# Patient Record
Sex: Male | Born: 1965
Health system: Southern US, Community
[De-identification: ages and names within clinical notes are randomized; demographics above are authoritative.]

## PROBLEM LIST (undated history)

## (undated) DIAGNOSIS — B20 Human immunodeficiency virus [HIV] disease: Secondary | ICD-10-CM

## (undated) DIAGNOSIS — Z8619 Personal history of other infectious and parasitic diseases: Secondary | ICD-10-CM

## (undated) DIAGNOSIS — I10 Essential (primary) hypertension: Secondary | ICD-10-CM

## (undated) DIAGNOSIS — K529 Noninfective gastroenteritis and colitis, unspecified: Secondary | ICD-10-CM

## (undated) DIAGNOSIS — K610 Anal abscess: Secondary | ICD-10-CM

## (undated) DIAGNOSIS — K649 Unspecified hemorrhoids: Secondary | ICD-10-CM

## (undated) DIAGNOSIS — M009 Pyogenic arthritis, unspecified: Secondary | ICD-10-CM

## (undated) DIAGNOSIS — I82409 Acute embolism and thrombosis of unspecified deep veins of unspecified lower extremity: Secondary | ICD-10-CM

## (undated) DIAGNOSIS — K611 Rectal abscess: Secondary | ICD-10-CM

## (undated) DIAGNOSIS — K5790 Diverticulosis of intestine, part unspecified, without perforation or abscess without bleeding: Secondary | ICD-10-CM

## (undated) DIAGNOSIS — M549 Dorsalgia, unspecified: Secondary | ICD-10-CM

## (undated) HISTORY — PX: OTHER SURGICAL HISTORY: SHX169

## (undated) HISTORY — DX: Pyogenic arthritis, unspecified: M00.9

## (undated) HISTORY — DX: Noninfective gastroenteritis and colitis, unspecified: K52.9

## (undated) HISTORY — DX: Anal abscess: K61.0

## (undated) HISTORY — DX: Diverticulosis of intestine, part unspecified, without perforation or abscess without bleeding: K57.90

## (undated) HISTORY — DX: Unspecified hemorrhoids: K64.9

## (undated) HISTORY — DX: Dorsalgia, unspecified: M54.9

## (undated) HISTORY — DX: Rectal abscess: K61.1

---

## 1999-10-21 ENCOUNTER — Emergency Department (HOSPITAL_COMMUNITY): Admission: EM | Admit: 1999-10-21 | Discharge: 1999-10-21 | Payer: Self-pay | Admitting: Emergency Medicine

## 1999-10-21 ENCOUNTER — Encounter: Payer: Self-pay | Admitting: Emergency Medicine

## 2001-02-28 ENCOUNTER — Encounter: Payer: Self-pay | Admitting: Emergency Medicine

## 2001-02-28 ENCOUNTER — Emergency Department (HOSPITAL_COMMUNITY): Admission: EM | Admit: 2001-02-28 | Discharge: 2001-02-28 | Payer: Self-pay | Admitting: Emergency Medicine

## 2007-03-29 ENCOUNTER — Ambulatory Visit: Payer: Self-pay | Admitting: Nurse Practitioner

## 2007-03-29 DIAGNOSIS — I10 Essential (primary) hypertension: Secondary | ICD-10-CM | POA: Insufficient documentation

## 2007-03-29 LAB — CONVERTED CEMR LAB
ALT: 27 units/L (ref 0–53)
AST: 36 units/L (ref 0–37)
Albumin: 4.5 g/dL (ref 3.5–5.2)
Alkaline Phosphatase: 47 units/L (ref 39–117)
BUN: 10 mg/dL (ref 6–23)
Basophils Absolute: 0 10*3/uL (ref 0.0–0.1)
Basophils Relative: 0 % (ref 0–1)
Bilirubin Urine: NEGATIVE
Blood in Urine, dipstick: NEGATIVE
CO2: 28 meq/L (ref 19–32)
Calcium: 9.5 mg/dL (ref 8.4–10.5)
Chloride: 99 meq/L (ref 96–112)
Cholesterol: 155 mg/dL (ref 0–200)
Creatinine, Ser: 0.9 mg/dL (ref 0.40–1.50)
Eosinophils Absolute: 0.2 10*3/uL (ref 0.2–0.7)
Eosinophils Relative: 3 % (ref 0–5)
Glucose, Bld: 113 mg/dL — ABNORMAL HIGH (ref 70–99)
Glucose, Urine, Semiquant: NEGATIVE
HCT: 44.3 % (ref 39.0–52.0)
HDL: 36 mg/dL — ABNORMAL LOW (ref >39)
Hemoglobin: 15 g/dL (ref 13.0–17.0)
Ketones, urine, test strip: NEGATIVE
LDL Cholesterol: 98 mg/dL (ref 0–99)
Lymphocytes Relative: 45 % (ref 12–46)
Lymphs Abs: 2.1 10*3/uL (ref 0.7–4.0)
MCHC: 33.9 g/dL (ref 30.0–36.0)
MCV: 88.2 fL (ref 78.0–100.0)
Monocytes Absolute: 0.5 10*3/uL (ref 0.1–1.0)
Monocytes Relative: 10 % (ref 3–12)
Neutro Abs: 1.9 10*3/uL (ref 1.7–7.7)
Neutrophils Relative %: 42 % — ABNORMAL LOW (ref 43–77)
Nitrite: NEGATIVE
PSA: 0.6 ng/mL (ref 0.10–4.00)
Platelets: 207 10*3/uL (ref 150–400)
Potassium: 4 meq/L (ref 3.5–5.3)
Protein, U semiquant: NEGATIVE
RBC: 5.02 M/uL (ref 4.22–5.81)
RDW: 14.3 % (ref 11.5–15.5)
Sodium: 137 meq/L (ref 135–145)
Specific Gravity, Urine: 1.025
TSH: 2.993 microintl units/mL (ref 0.350–5.50)
Total Bilirubin: 0.5 mg/dL (ref 0.3–1.2)
Total CHOL/HDL Ratio: 4.3
Total Protein: 8 g/dL (ref 6.0–8.3)
Triglycerides: 103 mg/dL (ref <150)
Urobilinogen, UA: 0.2
VLDL: 21 mg/dL (ref 0–40)
WBC Urine, dipstick: NEGATIVE
WBC: 4.7 10*3/uL (ref 4.0–10.5)
pH: 5

## 2007-04-03 ENCOUNTER — Encounter (INDEPENDENT_AMBULATORY_CARE_PROVIDER_SITE_OTHER): Payer: Self-pay | Admitting: Nurse Practitioner

## 2007-04-21 ENCOUNTER — Encounter (INDEPENDENT_AMBULATORY_CARE_PROVIDER_SITE_OTHER): Payer: Self-pay | Admitting: Nurse Practitioner

## 2007-11-02 ENCOUNTER — Telehealth (INDEPENDENT_AMBULATORY_CARE_PROVIDER_SITE_OTHER): Payer: Self-pay | Admitting: Nurse Practitioner

## 2008-03-27 ENCOUNTER — Telehealth (INDEPENDENT_AMBULATORY_CARE_PROVIDER_SITE_OTHER): Payer: Self-pay | Admitting: Nurse Practitioner

## 2008-07-12 ENCOUNTER — Emergency Department (HOSPITAL_COMMUNITY): Admission: EM | Admit: 2008-07-12 | Discharge: 2008-07-12 | Payer: Self-pay | Admitting: Neurology

## 2010-07-15 LAB — URINALYSIS, ROUTINE W REFLEX MICROSCOPIC
Bilirubin Urine: NEGATIVE
Glucose, UA: NEGATIVE mg/dL
Hgb urine dipstick: NEGATIVE
Ketones, ur: NEGATIVE mg/dL
Nitrite: NEGATIVE
Protein, ur: NEGATIVE mg/dL
Specific Gravity, Urine: 1.007 (ref 1.005–1.030)
Urobilinogen, UA: 0.2 mg/dL (ref 0.0–1.0)
pH: 5.5 (ref 5.0–8.0)

## 2010-07-15 LAB — CBC
HCT: 43.3 % (ref 39.0–52.0)
Hemoglobin: 15.1 g/dL (ref 13.0–17.0)
MCHC: 34.8 g/dL (ref 30.0–36.0)
RDW: 13.7 % (ref 11.5–15.5)

## 2010-07-15 LAB — RAPID URINE DRUG SCREEN, HOSP PERFORMED
Amphetamines: NOT DETECTED
Barbiturates: NOT DETECTED
Benzodiazepines: NOT DETECTED
Cocaine: POSITIVE — AB
Opiates: NOT DETECTED
Tetrahydrocannabinol: NOT DETECTED

## 2010-07-15 LAB — DIFFERENTIAL
Basophils Absolute: 0 10*3/uL (ref 0.0–0.1)
Basophils Relative: 1 % (ref 0–1)
Eosinophils Relative: 0 % (ref 0–5)
Lymphocytes Relative: 54 % — ABNORMAL HIGH (ref 12–46)
Monocytes Absolute: 0.5 10*3/uL (ref 0.1–1.0)
Monocytes Relative: 11 % (ref 3–12)

## 2010-07-15 LAB — POCT I-STAT, CHEM 8
BUN: 8 mg/dL (ref 6–23)
Calcium, Ion: 1.07 mmol/L — ABNORMAL LOW (ref 1.12–1.32)
Glucose, Bld: 146 mg/dL — ABNORMAL HIGH (ref 70–99)
TCO2: 25 mmol/L (ref 0–100)

## 2010-07-15 LAB — ETHANOL
Alcohol, Ethyl (B): 147 mg/dL — ABNORMAL HIGH (ref 0–10)
Alcohol, Ethyl (B): 250 mg/dL — ABNORMAL HIGH (ref 0–10)

## 2011-08-29 ENCOUNTER — Emergency Department (HOSPITAL_COMMUNITY): Payer: Self-pay

## 2011-08-29 ENCOUNTER — Inpatient Hospital Stay (HOSPITAL_COMMUNITY)
Admission: EM | Admit: 2011-08-29 | Discharge: 2011-09-01 | DRG: 392 | Disposition: A | Discharge status: Home / Self Care | Payer: MEDICAID | Source: Ambulatory Visit | Attending: General Surgery | Admitting: General Surgery

## 2011-08-29 ENCOUNTER — Encounter (HOSPITAL_COMMUNITY): Payer: Self-pay | Admitting: *Deleted

## 2011-08-29 DIAGNOSIS — E876 Hypokalemia: Secondary | ICD-10-CM

## 2011-08-29 DIAGNOSIS — K529 Noninfective gastroenteritis and colitis, unspecified: Secondary | ICD-10-CM

## 2011-08-29 DIAGNOSIS — Z9119 Patient's noncompliance with other medical treatment and regimen: Secondary | ICD-10-CM

## 2011-08-29 DIAGNOSIS — I10 Essential (primary) hypertension: Secondary | ICD-10-CM | POA: Diagnosis present

## 2011-08-29 DIAGNOSIS — K648 Other hemorrhoids: Secondary | ICD-10-CM | POA: Diagnosis present

## 2011-08-29 DIAGNOSIS — R933 Abnormal findings on diagnostic imaging of other parts of digestive tract: Secondary | ICD-10-CM

## 2011-08-29 DIAGNOSIS — K5289 Other specified noninfective gastroenteritis and colitis: Principal | ICD-10-CM | POA: Diagnosis present

## 2011-08-29 DIAGNOSIS — K603 Anal fistula: Secondary | ICD-10-CM

## 2011-08-29 DIAGNOSIS — Z91199 Patient's noncompliance with other medical treatment and regimen due to unspecified reason: Secondary | ICD-10-CM

## 2011-08-29 DIAGNOSIS — K5730 Diverticulosis of large intestine without perforation or abscess without bleeding: Secondary | ICD-10-CM

## 2011-08-29 DIAGNOSIS — K611 Rectal abscess: Secondary | ICD-10-CM

## 2011-08-29 DIAGNOSIS — K626 Ulcer of anus and rectum: Secondary | ICD-10-CM | POA: Diagnosis present

## 2011-08-29 DIAGNOSIS — D649 Anemia, unspecified: Secondary | ICD-10-CM

## 2011-08-29 DIAGNOSIS — K5732 Diverticulitis of large intestine without perforation or abscess without bleeding: Secondary | ICD-10-CM | POA: Diagnosis present

## 2011-08-29 HISTORY — DX: Essential (primary) hypertension: I10

## 2011-08-29 LAB — DIFFERENTIAL
Lymphocytes Relative: 21 % (ref 12–46)
Lymphs Abs: 1.1 10*3/uL (ref 0.7–4.0)
Monocytes Relative: 7 % (ref 3–12)
Neutro Abs: 3.6 10*3/uL (ref 1.7–7.7)
Neutrophils Relative %: 70 % (ref 43–77)

## 2011-08-29 LAB — BASIC METABOLIC PANEL
Calcium: 8.8 mg/dL (ref 8.4–10.5)
Creatinine, Ser: 1.19 mg/dL (ref 0.50–1.35)
GFR calc Af Amer: 85 mL/min — ABNORMAL LOW (ref >90)
Potassium: 2.9 mEq/L — ABNORMAL LOW (ref 3.5–5.1)
Sodium: 140 mEq/L (ref 135–145)

## 2011-08-29 LAB — URINALYSIS, ROUTINE W REFLEX MICROSCOPIC
Bilirubin Urine: NEGATIVE
Leukocytes, UA: NEGATIVE
Nitrite: NEGATIVE
Specific Gravity, Urine: 1.013 (ref 1.005–1.030)
Urobilinogen, UA: 1 mg/dL (ref 0.0–1.0)
pH: 5 (ref 5.0–8.0)

## 2011-08-29 LAB — CBC
Hemoglobin: 12 g/dL — ABNORMAL LOW (ref 13.0–17.0)
MCH: 28.6 pg (ref 26.0–34.0)
Platelets: 159 10*3/uL (ref 150–400)
RBC: 4.2 MIL/uL — ABNORMAL LOW (ref 4.22–5.81)
WBC: 5.1 10*3/uL (ref 4.0–10.5)

## 2011-08-29 MED ORDER — HYDROCHLOROTHIAZIDE 12.5 MG PO CAPS
12.5000 mg | ORAL_CAPSULE | Freq: Every day | ORAL | Status: DC
  Administered 2011-08-29 – 2011-08-31 (×3): 12.5 mg via ORAL
  Filled 2011-08-29 (×3): qty 1

## 2011-08-29 MED ORDER — OXYCODONE HCL 5 MG PO TABS
5.0000 mg | ORAL_TABLET | ORAL | Status: DC | PRN
  Administered 2011-08-30 – 2011-09-01 (×6): 5 mg via ORAL
  Filled 2011-08-29 (×6): qty 1

## 2011-08-29 MED ORDER — SODIUM CHLORIDE 0.9 % IV BOLUS (SEPSIS)
1000.0000 mL | Freq: Once | INTRAVENOUS | Status: AC
  Administered 2011-08-29: 1000 mL via INTRAVENOUS

## 2011-08-29 MED ORDER — ONDANSETRON HCL 4 MG/2ML IJ SOLN: 4.0000 mg | Freq: Four times a day (QID) | INTRAMUSCULAR | Status: DC | PRN

## 2011-08-29 MED ORDER — KCL IN DEXTROSE-NACL 20-5-0.45 MEQ/L-%-% IV SOLN
INTRAVENOUS | Status: DC
  Administered 2011-08-29 – 2011-08-31 (×5): via INTRAVENOUS
  Filled 2011-08-29 (×8): qty 1000

## 2011-08-29 MED ORDER — IOHEXOL 300 MG/ML  SOLN
20.0000 mL | INTRAMUSCULAR | Status: DC
  Administered 2011-08-29: 20 mL via ORAL

## 2011-08-29 MED ORDER — ENOXAPARIN SODIUM 40 MG/0.4ML ~~LOC~~ SOLN
40.0000 mg | SUBCUTANEOUS | Status: DC
  Administered 2011-08-29 – 2011-08-31 (×3): 40 mg via SUBCUTANEOUS
  Filled 2011-08-29 (×5): qty 0.4

## 2011-08-29 MED ORDER — SODIUM CHLORIDE 0.9 % IV SOLN
3.0000 g | Freq: Once | INTRAVENOUS | Status: AC
  Administered 2011-08-29: 3 g via INTRAVENOUS
  Filled 2011-08-29: qty 3

## 2011-08-29 MED ORDER — POTASSIUM CHLORIDE 10 MEQ/100ML IV SOLN
10.0000 meq | Freq: Once | INTRAVENOUS | Status: AC
  Administered 2011-08-29: 10 meq via INTRAVENOUS
  Filled 2011-08-29: qty 100

## 2011-08-29 MED ORDER — PANTOPRAZOLE SODIUM 40 MG IV SOLR
40.0000 mg | Freq: Every day | INTRAVENOUS | Status: DC
  Administered 2011-08-29 – 2011-08-31 (×3): 40 mg via INTRAVENOUS
  Filled 2011-08-29 (×4): qty 40

## 2011-08-29 MED ORDER — METRONIDAZOLE IN NACL 5-0.79 MG/ML-% IV SOLN
500.0000 mg | Freq: Three times a day (TID) | INTRAVENOUS | Status: DC
  Administered 2011-08-29 – 2011-09-01 (×9): 500 mg via INTRAVENOUS
  Filled 2011-08-29 (×10): qty 100

## 2011-08-29 MED ORDER — POTASSIUM CHLORIDE CRYS ER 20 MEQ PO TBCR
40.0000 meq | EXTENDED_RELEASE_TABLET | Freq: Once | ORAL | Status: AC
  Administered 2011-08-29: 40 meq via ORAL
  Filled 2011-08-29: qty 2

## 2011-08-29 MED ORDER — VITAMIN B-1 100 MG PO TABS
50.0000 mg | ORAL_TABLET | Freq: Once | ORAL | Status: AC
  Administered 2011-08-29: 50 mg via ORAL
  Filled 2011-08-29: qty 1

## 2011-08-29 MED ORDER — LISINOPRIL 20 MG PO TABS
20.0000 mg | ORAL_TABLET | Freq: Every day | ORAL | Status: DC
  Administered 2011-08-29 – 2011-08-31 (×3): 20 mg via ORAL
  Filled 2011-08-29 (×3): qty 1

## 2011-08-29 MED ORDER — IOHEXOL 300 MG/ML  SOLN
125.0000 mL | Freq: Once | INTRAMUSCULAR | Status: AC | PRN
  Administered 2011-08-29: 125 mL via INTRAVENOUS

## 2011-08-29 MED ORDER — MORPHINE SULFATE 2 MG/ML IJ SOLN: 2.0000 mg | INTRAMUSCULAR | Status: DC | PRN

## 2011-08-29 MED ORDER — DEXTROSE 5 % IV SOLN
1.0000 g | INTRAVENOUS | Status: DC
  Administered 2011-08-29 – 2011-08-31 (×3): 1 g via INTRAVENOUS
  Filled 2011-08-29 (×4): qty 10

## 2011-08-29 NOTE — ED Notes (Signed)
Patient transported to CT 

## 2011-08-29 NOTE — ED Provider Notes (Signed)
History     CSN: [id number]  Arrival date & time 08/29/11  4098   First MD Initiated Contact with Patient 08/29/11 [phone]      Chief Complaint  Patient presents with  . Recurrent Skin Infections    (Consider location/radiation/quality/duration/timing/severity/associated sxs/prior treatment) HPI Comments: Patient with a history of multiple skin infections, substance abuse with cocaine & alcohol presents to the emergency department with the chief complaint of repeated skin infections in the groin area.  Patient states he's had this infection intermittently over the last couple of months and that it hurts with ambulation.  He has been putting Vaseline in area to decrease pain with chafing.  Patient denies fevers, night sweats, chills, IV drug use, diabetes, steroid use, HIV or a history of STDs. Pt states he is sexually active and he does not always use protection. He denies abnormal penile discharge, dysuria, or penile lesions.   The history is provided by the patient.    Past Medical History  Diagnosis Date  . Hypertension     History reviewed. No pertinent past surgical history.  History reviewed. No pertinent family history.  History  Substance Use Topics  . Smoking status: Current Everyday Smoker  . Smokeless tobacco: Not on file  . Alcohol Use: Yes      Review of Systems  HENT: Negative for neck pain.   Genitourinary: Positive for genital sores. Negative for dysuria, frequency, hematuria, flank pain, discharge, penile swelling, scrotal swelling, enuresis, penile pain and testicular pain.  Musculoskeletal: Negative for back pain.  All other systems reviewed and are negative.    Allergies  Review of patient's allergies indicates no known allergies.  Home Medications   Current Outpatient Rx  Name Route Sig Dispense Refill  . IBUPROFEN 200 MG PO TABS Oral Take 800-1,000 mg by mouth every 6 (six) hours as needed. For pain    . LISINOPRIL-HYDROCHLOROTHIAZIDE 20-12.5  MG PO TABS Oral Take 1 tablet by mouth daily.      BP 115/70  Pulse 82  Temp(Src) 97.8 F (36.6 C) (Oral)  Resp 20  SpO2 98%  Physical Exam  Nursing note and vitals reviewed. Constitutional: He appears well-developed and well-nourished. No distress.       Sleeping. Fruity breath. Ammonia needed to awake pt for questioning    HENT:  Head: Normocephalic and atraumatic.  Eyes: Conjunctivae and EOM are normal.  Neck: Normal range of motion.  Cardiovascular: Normal rate, regular rhythm, normal heart sounds and intact distal pulses.   Pulmonary/Chest: Effort normal.  Abdominal:       Soft, obese,  nontenter to palpation. Non pulsatile aorta.   Genitourinary:       Exam chaperoned. Multiple ulcerated lesions in both right and left groin. Not currently draining. Severe ttp. No penile discharge, or swelling, 2 ulcerated lesions on penile head. Multiple draining abscesses of perianal region that appear to have been drained before.  Normal scrotum. Question perianal fluctuance involving rectal wall. NO gross blood on exam, hemoccult pending   Musculoskeletal: Normal range of motion.  Skin: Skin is warm and dry. No rash noted. He is not diaphoretic.  Psychiatric: He has a normal mood and affect. His behavior is normal.    ED Course  Procedures (including critical care time)  Labs Reviewed  CBC - Abnormal; Notable for the following:    RBC 4.20 (*)    Hemoglobin 12.0 (*)    HCT 36.1 (*)    RDW 16.1 (*)    All other  components within normal limits  BASIC METABOLIC PANEL - Abnormal; Notable for the following:    Potassium 2.9 (*)    Glucose, Bld 127 (*)    GFR calc non Af Amer 73 (*)    GFR calc Af Amer 85 (*)    All other components within normal limits  ETHANOL - Abnormal; Notable for the following:    Alcohol, Ethyl (B) 265 (*)    All other components within normal limits  URINALYSIS, ROUTINE W REFLEX MICROSCOPIC  DIFFERENTIAL  HERPES SIMPLEX VIRUS CULTURE  DRUG SCREEN PANEL  (SERUM)   No results found.   No diagnosis found.    MDM  Hypokalemia, ? Diverticulitis vs colon CA, rectal fistula from lower back to anus, anemia, multiple abscesses   Pt to be admitted to General surgery. The patient appears reasonably stabilized for admission considering the current resources, flow, and capabilities available in the ED at this time, and I doubt any other [facility] requiring further screening and/or treatment in the ED prior to admission. BP 115/70  Pulse 82  Temp(Src) 97.8 F (36.6 C) (Oral)  Resp 20  SpO2 98%           Jaci Carrel, PA-C 08/29/11 1304

## 2011-08-29 NOTE — ED Notes (Signed)
Pt states boils in genital area, pt states constant pt tried medications ointment with no relief. Pt states raw area in groin and little bumps around boil started to form.

## 2011-08-29 NOTE — ED Notes (Signed)
Given contrast, and ambulated to restroom

## 2011-08-29 NOTE — H&P (Signed)
Leon Nichols is an 46 y.o. male.   Chief Complaint: Perianal pain and drainage HPI: Patient has a long history of recurrent perianal ulcers with drainage. This has not been formally evaluated in the past. He has been treated with courses of antibiotics which improved but do not resolve the situation. It was becoming worse facility came to the emergency department today. He has had no generalized abdominal pain. He's had no nausea or vomiting. Bowel movements have been normal. He notes occasional blood in stool when he is taking an antibiotic regimen. CT scan of the abdomen and pelvis reveals inflammation of the proximal sigmoid colon consistent with colitis versus mass. It also shows a cutaneous ulcer and fissure along her gluteal cleft extending from the perianal region up towards the coccyx with inflammation. No abscess is seen. I was asked to see the patient from a surgical standpoint.  Past Medical History  Diagnosis Date  . Hypertension     History reviewed. No pertinent past surgical history.  History reviewed. No pertinent family history. Social History:  reports that he has been smoking.  He does not have any smokeless tobacco history on file. He reports that he drinks alcohol. He reports that he uses illicit drugs (Marijuana and Cocaine).  Allergies: No Known Allergies   (Not in a hospital admission)  Results for orders placed during the hospital encounter of 08/29/11 (from the past 48 hour(s))  URINALYSIS, ROUTINE W REFLEX MICROSCOPIC     Status: Normal   Collection Time   08/29/11  7:40 AM      Component Value Range Comment   Color, Urine YELLOW  YELLOW     APPearance CLEAR  CLEAR     Specific Gravity, Urine 1.013  1.005 - 1.030     pH 5.0  5.0 - 8.0     Glucose, UA NEGATIVE  NEGATIVE (mg/dL)    Hgb urine dipstick NEGATIVE  NEGATIVE     Bilirubin Urine NEGATIVE  NEGATIVE     Ketones, ur NEGATIVE  NEGATIVE (mg/dL)    Protein, ur NEGATIVE  NEGATIVE (mg/dL)    Urobilinogen,  UA 1.0  0.0 - 1.0 (mg/dL)    Nitrite NEGATIVE  NEGATIVE     Leukocytes, UA NEGATIVE  NEGATIVE  MICROSCOPIC NOT DONE ON URINES WITH NEGATIVE PROTEIN, BLOOD, LEUKOCYTES, NITRITE, OR GLUCOSE <1000 mg/dL.  CBC     Status: Abnormal   Collection Time   08/29/11  7:43 AM      Component Value Range Comment   WBC 5.1  4.0 - 10.5 (K/uL)    RBC 4.20 (*) 4.22 - 5.81 (MIL/uL)    Hemoglobin 12.0 (*) 13.0 - 17.0 (g/dL)    HCT 53.6 (*) 64.4 - 52.0 (%)    MCV 86.0  78.0 - 100.0 (fL)    MCH 28.6  26.0 - 34.0 (pg)    MCHC 33.2  30.0 - 36.0 (g/dL)    RDW 03.4 (*) 74.2 - 15.5 (%)    Platelets 159  150 - 400 (K/uL)   DIFFERENTIAL     Status: Normal   Collection Time   08/29/11  7:43 AM      Component Value Range Comment   Neutrophils Relative 70  43 - 77 (%)    Neutro Abs 3.6  1.7 - 7.7 (K/uL)    Lymphocytes Relative 21  12 - 46 (%)    Lymphs Abs 1.1  0.7 - 4.0 (K/uL)    Monocytes Relative 7  3 - 12 (%)  Monocytes Absolute 0.4  0.1 - 1.0 (K/uL)    Eosinophils Relative 2  0 - 5 (%)    Eosinophils Absolute 0.1  0.0 - 0.7 (K/uL)    Basophils Relative 0  0 - 1 (%)    Basophils Absolute 0.0  0.0 - 0.1 (K/uL)   BASIC METABOLIC PANEL     Status: Abnormal   Collection Time   08/29/11  7:43 AM      Component Value Range Comment   Sodium 140  135 - 145 (mEq/L)    Potassium 2.9 (*) 3.5 - 5.1 (mEq/L)    Chloride 101  96 - 112 (mEq/L)    CO2 20  19 - 32 (mEq/L)    Glucose, Bld 127 (*) 70 - 99 (mg/dL)    BUN 11  6 - 23 (mg/dL)    Creatinine, Ser 4.09  0.50 - 1.35 (mg/dL)    Calcium 8.8  8.4 - 10.5 (mg/dL)    GFR calc non Af Amer 73 (*) >90 (mL/min)    GFR calc Af Amer 85 (*) >90 (mL/min)   ETHANOL     Status: Abnormal   Collection Time   08/29/11  7:43 AM      Component Value Range Comment   Alcohol, Ethyl (B) 265 (*) 0 - 11 (mg/dL)   OCCULT BLOOD, POC DEVICE     Status: Normal   Collection Time   08/29/11 12:36 PM      Component Value Range Comment   Fecal Occult Bld POSITIVE      Ct Abdomen Pelvis  W Contrast  08/29/2011  *RADIOLOGY REPORT*  Clinical Data: Boils in the pelvic area.  CT ABDOMEN AND PELVIS WITH CONTRAST  Technique:  Multidetector CT imaging of the abdomen and pelvis was performed following the standard protocol during bolus administration of intravenous contrast. Oral and rectal contrast was given for this examination.  Contrast: OMNIPAQUE IOHEXOL 300 MG/ML  SOLN  Comparison: None.  Findings: Lungs clear with exception of dependent atelectasis. There is no evidence for free intraperitoneal air.  The gallbladder has an unusual appearance and may be within the liver parenchyma or capsule.  The gallbladder is very small and the tip may be folded on itself.  Difficult to exclude a small cystic structure near the tip of the gallbladder.  There is no evidence for acute inflammation.  Main portal venous system appears to be patent.  Normal appearance of the pancreas, adrenal glands, kidneys and spleen.  There are multiple lymph nodes throughout the inguinal regions bilaterally. There are prominent retroperitoneal nodes in the pelvis.  Index node along the right pelvic sidewall measures 1.1 x 1.5 cm on sequence 2, image 79.  There are small scattered lymph nodes along the iliac chains bilaterally.  There is wall thickening of the lower left colon and sigmoid colon. There is also edema and stranding in the sigmoid mesocolon.  There are prominent lymph node adjacent to the inflamed colon on sequence 2, image 58.  Additional lymph nodes on image 52.  The rectal contrast stops at the level of this colonic wall thickening.  There is a cutaneous ulcer and fissure along the left side of the back that extends towards the coccyx bone.  There is a small amount of soft tissue just anterior to the coccyx bone which could be inflammatory.  There is no evidence for a drainable abscess collection.  There is no significant free fluid.  No gross abnormality to the prostate, seminal vessels or  urinary bladder.   Degenerative changes at L5-S1.  IMPRESSION: Wall thickening of the sigmoid colon and lower descending colon. There is adjacent inflammation and lymphadenopathy.  The findings could be related to acute diverticulitis or colitis.  However, the adjacent lymph nodes also raise concern for an underlying neoplasm. Recommend GI consultation.  Cutaneous ulceration and fissure that extends towards the coccyx. Small amount of soft tissue just anterior to the coccyx may be inflammatory.  There are no drainable abscess collections.  Unusual appearance of the gallbladder.  This could represent a gallbladder variant such as an intrahepatic gallbladder.  There is no evidence for acute inflammation.  This area may be further evaluated with ultrasound.  These results were called by telephone on 08/29/2011  at  12:38 p.m. to   [facility], who verbally acknowledged these results.  Original Report Authenticated By: Richarda Overlie, M.D.    Review of Systems  Constitutional: Negative.   HENT: Negative.   Eyes: Negative.   Respiratory: Negative.   Cardiovascular: Negative for chest pain and palpitations.       History of hypertension  Gastrointestinal: Positive for blood in stool.  Genitourinary:       Irritation of genitalia secondary to fluid draining from perianal ulcers  Musculoskeletal: Negative.   Skin:       See history of present illness  Neurological: Negative.     Blood pressure 133/90, pulse 87, temperature 98.3 F (36.8 C), temperature source Oral, resp. rate 20, SpO2 97.00%. Physical Exam  Constitutional: He is oriented to person, place, and time. He appears well-developed and well-nourished. No distress.  HENT:  Head: Normocephalic and atraumatic.  Mouth/Throat: No oropharyngeal exudate.  Eyes: EOM are normal. Pupils are equal, round, and reactive to light. No scleral icterus.  Neck: Normal range of motion. Neck supple. No JVD present.  Cardiovascular: Normal rate, regular rhythm, normal heart sounds  and intact distal pulses.   No murmur heard. Respiratory: Effort normal and breath sounds normal. No respiratory distress. He has no wheezes. He has no rales.  GI: Soft. Bowel sounds are normal. He exhibits no distension. There is no tenderness. There is no rebound and no guarding.  Genitourinary:       Several ulcers extending from the perianal region up along her gluteal cleft towards the coccyx with purulent drainage, no induration, no significant cellulitis, no palpable undrained collections, limited rectal exam has no blood  Musculoskeletal: Normal range of motion.  Neurological: He is alert and oriented to person, place, and time. He exhibits normal muscle tone.  Skin:       See above     Assessment/Plan Patient with proximal sigmoid colitis as well as periectal ulcerations with drainage. I'm concerned this represents Crohn's disease. It could also represent diverticulitis. Sigmoid colon mass is a less likely possibility. I will admit the patient, begin bowel rest and intravenous antibiotics, and request GI consultation. Plan was discussed in detail with the patient.  Artice Bergerson E 08/29/2011, 1:29 PM

## 2011-08-30 LAB — CBC
Hemoglobin: 10.9 g/dL — ABNORMAL LOW (ref 13.0–17.0)
MCHC: 32.8 g/dL (ref 30.0–36.0)
RBC: 3.85 MIL/uL — ABNORMAL LOW (ref 4.22–5.81)

## 2011-08-30 LAB — BASIC METABOLIC PANEL
GFR calc Af Amer: >90 mL/min (ref >90)
GFR calc non Af Amer: >90 mL/min (ref >90)
Potassium: 3.3 mEq/L — ABNORMAL LOW (ref 3.5–5.1)
Sodium: 138 mEq/L (ref 135–145)

## 2011-08-30 MED ORDER — POTASSIUM CHLORIDE 10 MEQ/100ML IV SOLN
10.0000 meq | INTRAVENOUS | Status: AC
  Administered 2011-08-30 (×2): 10 meq via INTRAVENOUS
  Filled 2011-08-30 (×4): qty 100

## 2011-08-30 MED ORDER — POTASSIUM CHLORIDE 10 MEQ/100ML IV SOLN
10.0000 meq | INTRAVENOUS | Status: AC
  Administered 2011-08-30: 10 meq via INTRAVENOUS
  Filled 2011-08-30: qty 100

## 2011-08-30 MED ORDER — METOPROLOL TARTRATE 1 MG/ML IV SOLN
5.0000 mg | Freq: Four times a day (QID) | INTRAVENOUS | Status: DC | PRN
  Administered 2011-08-30 – 2011-08-31 (×3): 5 mg via INTRAVENOUS
  Filled 2011-08-30 (×4): qty 5

## 2011-08-30 MED ORDER — PEG 3350-KCL-NA BICARB-NACL 420 G PO SOLR
4000.0000 mL | Freq: Once | ORAL | Status: AC
  Administered 2011-08-30: 4000 mL via ORAL
  Filled 2011-08-30 (×2): qty 4000

## 2011-08-30 NOTE — Progress Notes (Signed)
INITIAL ADULT NUTRITION ASSESSMENT Date: 08/30/2011   Time: 12:08 PM Reason for Assessment: nutrition risk; wt loss  ASSESSMENT: Male 46 y.o.  Dx: perianal pain and drainage  Hx:  Past Medical History  Diagnosis Date  . Hypertension    History reviewed. No pertinent past surgical history.  Related Meds:  Scheduled Meds:   . ampicillin-sulbactam (UNASYN) IV  3 g Intravenous Once  . cefTRIAXone (ROCEPHIN)  IV  1 g Intravenous Q24H  . enoxaparin  40 mg Subcutaneous Q24H  . hydrochlorothiazide  12.5 mg Oral Daily  . lisinopril  20 mg Oral Daily  . metronidazole  500 mg Intravenous Q8H  . pantoprazole (PROTONIX) IV  40 mg Intravenous QHS  . potassium chloride  10 mEq Intravenous Once  . potassium chloride  10 mEq Intravenous Q1 Hr x 3  . DISCONTD: iohexol  20 mL Oral Q1 Hr x 2   Continuous Infusions:   . dextrose 5 % and 0.45 % NaCl with KCl 20 mEq/L 125 mL/hr at 08/30/11 1022   PRN Meds:.morphine injection, ondansetron, oxyCODONE   Ht: 6\' 1"  (185.4 cm)  Wt: 240 lb 3.2 oz (108.954 kg)  Ideal Wt: 83.6 % Ideal Wt: 130%  Usual Wt: 280 lbs, approximately 1 yr ago % Usual Wt: 85%  Body mass index is 31.69 kg/(m^2).  Food/Nutrition Related Hx: intentional wt loss of 40 lbs over 1 year.  Labs:  CMP     Component Value Date/Time   NA 138 08/30/2011 0540   K 3.3* 08/30/2011 0540   CL 104 08/30/2011 0540   CO2 24 08/30/2011 0540   GLUCOSE 107* 08/30/2011 0540   BUN 4* 08/30/2011 0540   CREATININE 0.79 08/30/2011 0540   CALCIUM 8.4 08/30/2011 0540   PROT 8.0 03/29/2007 2122   ALBUMIN 4.5 03/29/2007 2122   AST 36 03/29/2007 2122   ALT 27 03/29/2007 2122   ALKPHOS 47 03/29/2007 2122   BILITOT 0.5 03/29/2007 2122   GFRNONAA >90 08/30/2011 0540   GFRAA >90 08/30/2011 0540    CBC    Component Value Date/Time   WBC 4.5 08/30/2011 0540   RBC 3.85* 08/30/2011 0540   HGB 10.9* 08/30/2011 0540   HCT 33.2* 08/30/2011 0540   PLT 157 08/30/2011 0540   MCV 86.2 08/30/2011 0540   MCH 28.3 08/30/2011 0540   MCHC 32.8 08/30/2011 0540   RDW 16.4* 08/30/2011 0540   LYMPHSABS 1.1 08/29/2011 0743   MONOABS 0.4 08/29/2011 0743   EOSABS 0.1 08/29/2011 0743   BASOSABS 0.0 08/29/2011 0743    Intake: sips Output:   Intake/Output Summary (Last 24 hours) at 08/30/11 1246 Last data filed at 08/30/11 0900  Gross per 24 hour  Intake   2322 ml  Output   2575 ml  Net   -253 ml   No stool since admission.   Diet Order: Clear Liquid  Supplements/Tube Feeding: none at this time  IVF:    dextrose 5 % and 0.45 % NaCl with KCl 20 mEq/L Last Rate: 125 mL/hr at 08/30/11 1022    Estimated Nutritional Needs:   Kcal: 2390-2725 Protein: 87-109g Fluid: >2.4 L/day  Pt admitted with drainage from ulcers.  Pt states that approximately 1 year ago he initiated a weight loss plan.  He has lost 40 lbs in 1 year.  Recently his appetite has been less than usual, but he feels it is returning.  Denies nutrition-related concerns.  RD noted pt being evaluated for possible IBD.  Pt states that he  typically has 1 solid BM/day.  He states all foods are tolerated well; there are no foods he cannot or will not eat for fear of nausea or diarrhea.  NUTRITION DIAGNOSIS: -Increased nutrient needs (NI-5.1).  Status: Ongoing  RELATED TO: healing  AS EVIDENCE BY: pt with several non-healing ulcers  MONITORING/EVALUATION(Goals): 1. Food/Beverage; diet advancement with tolerance.  Pt with plans for possible endoscopy. 2.  Gastrointestinal;  Plan of care re: possible IBD.  Pt does not report ill tolerance of food or significant change in bowel habits at this time.  RD will monitor for education needs if dx occurs.  EDUCATION NEEDS: -No education needs identified at this time  INTERVENTION: 1.  Modify diet; diet advancement per MD discretion.  No supplements ordered at this time as pt expected to undergo possible endoscopy.  Dietitian #: [phone]  DOCUMENTATION CODES Per approved criteria  -Obesity  Unspecified    Loyce Dys [facility] 08/30/2011, 12:08 PM

## 2011-08-30 NOTE — ED Provider Notes (Signed)
Medical screening examination/treatment/procedure(s) were performed by non-physician practitioner and as supervising physician I was immediately available for consultation/collaboration.    Vida Roller, MD 08/30/11 [phone]

## 2011-08-30 NOTE — Progress Notes (Signed)
Subjective: Minimal discomfort.  No abdominal pain Awaiting GI consult  Objective: Vital signs in last 24 hours: Temp:  [97.6 F (36.4 C)-98.5 F (36.9 C)] 98.3 F (36.8 C) (05/27 0521) Pulse Rate:  [78-88] 79  (05/27 0521) Resp:  [18-20] 18  (05/27 0521) BP: (109-170)/(65-106) 153/100 mmHg (05/27 0521) SpO2:  [97 %-100 %] 100 % (05/27 0521) Weight:  [240 lb 3.2 oz (108.954 kg)] 240 lb 3.2 oz (108.954 kg) (05/26 1501) Last BM Date: 08/29/11  Intake/Output from previous day: 05/26 0701 - 05/27 0700 In: 2322 [P.O.:480; I.V.:1842] Out: 2350 [Urine:2350] Intake/Output this shift:    GI: soft, non-tender; bowel sounds normal; no masses,  no organomegaly Perianal induration with some drainage from open ulcer  Lab Results:   [facility] 08/30/11 0540 08/29/11 0743  WBC 4.5 5.1  HGB 10.9* 12.0*  HCT 33.2* 36.1*  PLT 157 159   BMET  Basename 08/30/11 0540 08/29/11 0743  NA 138 140  K 3.3* 2.9*  CL 104 101  CO2 24 20  GLUCOSE 107* 127*  BUN 4* 11  CREATININE 0.79 1.19  CALCIUM 8.4 8.8   PT/INR No results found for this basename: LABPROT:2,INR:2 in the last 72 hours ABG No results found for this basename: PHART:2,PCO2:2,PO2:2,HCO3:2 in the last 72 hours  Studies/Results: Ct Abdomen Pelvis W Contrast  08/29/2011  *RADIOLOGY REPORT*  Clinical Data: Boils in the pelvic area.  CT ABDOMEN AND PELVIS WITH CONTRAST  Technique:  Multidetector CT imaging of the abdomen and pelvis was performed following the standard protocol during bolus administration of intravenous contrast. Oral and rectal contrast was given for this examination.  Contrast: OMNIPAQUE IOHEXOL 300 MG/ML  SOLN  Comparison: None.  Findings: Lungs clear with exception of dependent atelectasis. There is no evidence for free intraperitoneal air.  The gallbladder has an unusual appearance and may be within the liver parenchyma or capsule.  The gallbladder is very small and the tip may be folded on itself.   Difficult to exclude a small cystic structure near the tip of the gallbladder.  There is no evidence for acute inflammation.  Main portal venous system appears to be patent.  Normal appearance of the pancreas, adrenal glands, kidneys and spleen.  There are multiple lymph nodes throughout the inguinal regions bilaterally. There are prominent retroperitoneal nodes in the pelvis.  Index node along the right pelvic sidewall measures 1.1 x 1.5 cm on sequence 2, image 79.  There are small scattered lymph nodes along the iliac chains bilaterally.  There is wall thickening of the lower left colon and sigmoid colon. There is also edema and stranding in the sigmoid mesocolon.  There are prominent lymph node adjacent to the inflamed colon on sequence 2, image 58.  Additional lymph nodes on image 52.  The rectal contrast stops at the level of this colonic wall thickening.  There is a cutaneous ulcer and fissure along the left side of the back that extends towards the coccyx bone.  There is a small amount of soft tissue just anterior to the coccyx bone which could be inflammatory.  There is no evidence for a drainable abscess collection.  There is no significant free fluid.  No gross abnormality to the prostate, seminal vessels or urinary bladder.  Degenerative changes at L5-S1.  IMPRESSION: Wall thickening of the sigmoid colon and lower descending colon. There is adjacent inflammation and lymphadenopathy.  The findings could be related to acute diverticulitis or colitis.  However, the adjacent lymph nodes also raise  concern for an underlying neoplasm. Recommend GI consultation.  Cutaneous ulceration and fissure that extends towards the coccyx. Small amount of soft tissue just anterior to the coccyx may be inflammatory.  There are no drainable abscess collections.  Unusual appearance of the gallbladder.  This could represent a gallbladder variant such as an intrahepatic gallbladder.  There is no evidence for acute inflammation.   This area may be further evaluated with ultrasound.  These results were called by telephone on 08/29/2011  at  12:38 p.m. to   [facility], who verbally acknowledged these results.  Original Report Authenticated By: Richarda Overlie, M.D.    Anti-infectives: Anti-infectives     Start     Dose/Rate Route Frequency Ordered Stop   08/29/11 1600   metroNIDAZOLE (FLAGYL) IVPB 500 mg        500 mg 100 mL/hr over 60 Minutes Intravenous Every 8 hours 08/29/11 1455     08/29/11 1600   cefTRIAXone (ROCEPHIN) 1 g in dextrose 5 % 50 mL IVPB        1 g 100 mL/hr over 30 Minutes Intravenous Every 24 hours 08/29/11 1455     08/29/11 1230   Ampicillin-Sulbactam (UNASYN) 3 g in sodium chloride 0.9 % 100 mL IVPB        3 g 100 mL/hr over 60 Minutes Intravenous  Once 08/29/11 1227 08/29/11 1343          Assessment/Plan: s/p * No surgery found * Continue antibiotics Await GI consult - ?colonoscopy to eval for inflammatory bowel disease Replete K  LOS: 1 day    Leon Nichols K. 08/30/2011

## 2011-08-30 NOTE — Consult Note (Signed)
Reason for Consult: Perianal abscess. Cross cover [employer]. Referring Physician: Violeta Gelinas MD-CCS Leon Nichols is an 46 y.o. male.  HPI: Mr. Magnussen gives a 6 month history of "boils". He is not a very good historian and claims he had these in his armpits first [?Hiradenitis supprativa] followed by "boils" on his abdomen and then in the natal cleft. He has been treated off and on with antibiotics, and had minimal relief. He denies a history of diarrhea, constipation or melena but has noticed BRB in the stool when he takes Ibuprofen. He denies having mucoid stools. He has no prior history of GI problems. There is no associated abdominal pain, nausea, fever, chills, dysphagia or odynophagia. He denies having any aphthous ulcers. There is no history of joint pains or ocular problems. A CT scan of the abdomen and pelvis on admission revealed thickening of the sigmoid colon with associated adenopathy and stranding of the mesocolon ?colitis;there is a fissure draining towards the coccyx.  Past Medical History  Diagnosis Date  . Hypertension    History reviewed. No pertinent past surgical history.  History reviewed. No pertinent family history. No family history of IBD or colon cancer.  Social History:  reports that he has been smoking cigarettes.  He has a 3.75 pack-year smoking history. He does not have any smokeless tobacco history on file. He reports that he drinks alcohol. He reports that he uses illicit drugs (Marijuana and Cocaine).  Allergies: No Known Allergies  Medications: I have reviewed the patient's current medications.  Results for orders placed during the hospital encounter of 08/29/11 (from the past 48 hour(s))  URINALYSIS, ROUTINE W REFLEX MICROSCOPIC     Status: Normal   Collection Time   08/29/11  7:40 AM      Component Value Range Comment   Color, Urine YELLOW  YELLOW     APPearance CLEAR  CLEAR     Specific Gravity, Urine 1.013  1.005 - 1.030     pH 5.0  5.0 - 8.0     Glucose, UA NEGATIVE  NEGATIVE (mg/dL)    Hgb urine dipstick NEGATIVE  NEGATIVE     Bilirubin Urine NEGATIVE  NEGATIVE     Ketones, ur NEGATIVE  NEGATIVE (mg/dL)    Protein, ur NEGATIVE  NEGATIVE (mg/dL)    Urobilinogen, UA 1.0  0.0 - 1.0 (mg/dL)    Nitrite NEGATIVE  NEGATIVE     Leukocytes, UA NEGATIVE  NEGATIVE  MICROSCOPIC NOT DONE ON URINES WITH NEGATIVE PROTEIN, BLOOD, LEUKOCYTES, NITRITE, OR GLUCOSE <1000 mg/dL.  CBC     Status: Abnormal   Collection Time   08/29/11  7:43 AM      Component Value Range Comment   WBC 5.1  4.0 - 10.5 (K/uL)    RBC 4.20 (*) 4.22 - 5.81 (MIL/uL)    Hemoglobin 12.0 (*) 13.0 - 17.0 (g/dL)    HCT 16.1 (*) 09.6 - 52.0 (%)    MCV 86.0  78.0 - 100.0 (fL)    MCH 28.6  26.0 - 34.0 (pg)    MCHC 33.2  30.0 - 36.0 (g/dL)    RDW 04.5 (*) 40.9 - 15.5 (%)    Platelets 159  150 - 400 (K/uL)   DIFFERENTIAL     Status: Normal   Collection Time   08/29/11  7:43 AM      Component Value Range Comment   Neutrophils Relative 70  43 - 77 (%)    Neutro Abs 3.6  1.7 - 7.7 (  K/uL)    Lymphocytes Relative 21  12 - 46 (%)    Lymphs Abs 1.1  0.7 - 4.0 (K/uL)    Monocytes Relative 7  3 - 12 (%)    Monocytes Absolute 0.4  0.1 - 1.0 (K/uL)    Eosinophils Relative 2  0 - 5 (%)    Eosinophils Absolute 0.1  0.0 - 0.7 (K/uL)    Basophils Relative 0  0 - 1 (%)    Basophils Absolute 0.0  0.0 - 0.1 (K/uL)   BASIC METABOLIC PANEL     Status: Abnormal   Collection Time   08/29/11  7:43 AM      Component Value Range Comment   Sodium 140  135 - 145 (mEq/L)    Potassium 2.9 (*) 3.5 - 5.1 (mEq/L)    Chloride 101  96 - 112 (mEq/L)    CO2 20  19 - 32 (mEq/L)    Glucose, Bld 127 (*) 70 - 99 (mg/dL)    BUN 11  6 - 23 (mg/dL)    Creatinine, Ser 1.61  0.50 - 1.35 (mg/dL)    Calcium 8.8  8.4 - 10.5 (mg/dL)    GFR calc non Af Amer 73 (*) >90 (mL/min)    GFR calc Af Amer 85 (*) >90 (mL/min)   ETHANOL     Status: Abnormal   Collection Time   08/29/11  7:43 AM      Component Value Range  Comment   Alcohol, Ethyl (B) 265 (*) 0 - 11 (mg/dL)   OCCULT BLOOD, POC DEVICE     Status: Normal   Collection Time   08/29/11 12:36 PM      Component Value Range Comment   Fecal Occult Bld POSITIVE     BASIC METABOLIC PANEL     Status: Abnormal   Collection Time   08/30/11  5:40 AM      Component Value Range Comment   Sodium 138  135 - 145 (mEq/L)    Potassium 3.3 (*) 3.5 - 5.1 (mEq/L)    Chloride 104  96 - 112 (mEq/L)    CO2 24  19 - 32 (mEq/L)    Glucose, Bld 107 (*) 70 - 99 (mg/dL)    BUN 4 (*) 6 - 23 (mg/dL)    Creatinine, Ser 0.96  0.50 - 1.35 (mg/dL) DELTA CHECK NOTED   Calcium 8.4  8.4 - 10.5 (mg/dL)    GFR calc non Af Amer >90  >90 (mL/min)    GFR calc Af Amer >90  >90 (mL/min)   CBC     Status: Abnormal   Collection Time   08/30/11  5:40 AM      Component Value Range Comment   WBC 4.5  4.0 - 10.5 (K/uL)    RBC 3.85 (*) 4.22 - 5.81 (MIL/uL)    Hemoglobin 10.9 (*) 13.0 - 17.0 (g/dL)    HCT 04.5 (*) 40.9 - 52.0 (%)    MCV 86.2  78.0 - 100.0 (fL)    MCH 28.3  26.0 - 34.0 (pg)    MCHC 32.8  30.0 - 36.0 (g/dL)    RDW 81.1 (*) 91.4 - 15.5 (%)    Platelets 157  150 - 400 (K/uL)     Ct Abdomen Pelvis W Contrast  08/29/2011  *RADIOLOGY REPORT*  Clinical Data: Boils in the pelvic area.  CT ABDOMEN AND PELVIS WITH CONTRAST  Technique:  Multidetector CT imaging of the abdomen and pelvis was performed following the standard protocol  during bolus administration of intravenous contrast. Oral and rectal contrast was given for this examination.  Contrast: OMNIPAQUE IOHEXOL 300 MG/ML  SOLN  Comparison: None.  Findings: Lungs clear with exception of dependent atelectasis. There is no evidence for free intraperitoneal air.  The gallbladder has an unusual appearance and may be within the liver parenchyma or capsule.  The gallbladder is very small and the tip may be folded on itself.  Difficult to exclude a small cystic structure near the tip of the gallbladder.  There is no evidence for  acute inflammation.  Main portal venous system appears to be patent.  Normal appearance of the pancreas, adrenal glands, kidneys and spleen.  There are multiple lymph nodes throughout the inguinal regions bilaterally. There are prominent retroperitoneal nodes in the pelvis.  Index node along the right pelvic sidewall measures 1.1 x 1.5 cm on sequence 2, image 79.  There are small scattered lymph nodes along the iliac chains bilaterally.  There is wall thickening of the lower left colon and sigmoid colon. There is also edema and stranding in the sigmoid mesocolon.  There are prominent lymph node adjacent to the inflamed colon on sequence 2, image 58.  Additional lymph nodes on image 52.  The rectal contrast stops at the level of this colonic wall thickening.  There is a cutaneous ulcer and fissure along the left side of the back that extends towards the coccyx bone.  There is a small amount of soft tissue just anterior to the coccyx bone which could be inflammatory.  There is no evidence for a drainable abscess collection.  There is no significant free fluid.  No gross abnormality to the prostate, seminal vessels or urinary bladder.  Degenerative changes at L5-S1.  IMPRESSION: Wall thickening of the sigmoid colon and lower descending colon. There is adjacent inflammation and lymphadenopathy.  The findings could be related to acute diverticulitis or colitis.  However, the adjacent lymph nodes also raise concern for an underlying neoplasm. Recommend GI consultation.  Cutaneous ulceration and fissure that extends towards the coccyx. Small amount of soft tissue just anterior to the coccyx may be inflammatory.  There are no drainable abscess collections.  Unusual appearance of the gallbladder.  This could represent a gallbladder variant such as an intrahepatic gallbladder.  There is no evidence for acute inflammation.  This area may be further evaluated with ultrasound.  These results were called by telephone on  08/29/2011  at  12:38 p.m. to   [facility], who verbally acknowledged these results.  Original Report Authenticated By: Richarda Overlie, M.D.    Review of Systems  Constitutional: Negative for fever, chills, weight loss, malaise/fatigue and diaphoresis.  HENT: Negative.   Eyes: Negative.   Respiratory: Negative.   Cardiovascular: Negative.   Gastrointestinal: Positive for constipation and blood in stool. Negative for heartburn, nausea, vomiting, abdominal pain, diarrhea and melena.  Genitourinary: Negative.   Musculoskeletal: Negative.   Skin: Negative for itching and rash.  Neurological: Negative.  Negative for weakness.  Endo/Heme/Allergies: Negative.   Psychiatric/Behavioral: Negative.    Blood pressure 163/111, pulse 82, temperature 98.2 F (36.8 C), temperature source Oral, resp. rate 19, height 6\' 1"  (1.854 m), weight 108.954 kg (240 lb 3.2 oz), SpO2 100.00%. Physical Exam  Constitutional: He is oriented to person, place, and time. He appears well-developed and well-nourished.  HENT:  Head: Normocephalic and atraumatic.  Eyes: Conjunctivae and EOM are normal. Pupils are equal, round, and reactive to light.  Neck: Normal range of  motion. Neck supple.  Cardiovascular: Normal rate and regular rhythm.   Respiratory: Effort normal and breath sounds normal.  GI: Soft. Bowel sounds are normal.  Musculoskeletal: Normal range of motion.  Neurological: He is alert and oriented to person, place, and time. He has normal reflexes.  Skin: Skin is warm and dry.     Psychiatric: He has a normal mood and affect. His behavior is normal. Thought content normal.    Assessment/Plan: 1) Cutaneous fissure with segmental colitis and lymphadenopathy on CT scan-history of occasional rectal bleeding. Will prep for a colonoscopy to be done by [facility]; IBD seems to be the most likely diagnosis. Agree witrh IV Flagyl.  2) HTN: On Lisinopril, Metoprolol and HCTZ.  Makayla Lanter 08/30/2011, 4:42 PM

## 2011-08-31 ENCOUNTER — Encounter (HOSPITAL_COMMUNITY): Payer: Self-pay | Admitting: *Deleted

## 2011-08-31 ENCOUNTER — Other Ambulatory Visit: Payer: Self-pay

## 2011-08-31 ENCOUNTER — Encounter (HOSPITAL_COMMUNITY): Admission: EM | Disposition: A | Discharge status: Home / Self Care | Payer: Self-pay | Source: Ambulatory Visit

## 2011-08-31 DIAGNOSIS — K5730 Diverticulosis of large intestine without perforation or abscess without bleeding: Secondary | ICD-10-CM

## 2011-08-31 DIAGNOSIS — R933 Abnormal findings on diagnostic imaging of other parts of digestive tract: Secondary | ICD-10-CM

## 2011-08-31 DIAGNOSIS — I10 Essential (primary) hypertension: Secondary | ICD-10-CM

## 2011-08-31 DIAGNOSIS — K5010 Crohn's disease of large intestine without complications: Secondary | ICD-10-CM

## 2011-08-31 DIAGNOSIS — K5289 Other specified noninfective gastroenteritis and colitis: Principal | ICD-10-CM

## 2011-08-31 DIAGNOSIS — K626 Ulcer of anus and rectum: Secondary | ICD-10-CM

## 2011-08-31 HISTORY — PX: COLONOSCOPY: SHX5424

## 2011-08-31 LAB — COMPREHENSIVE METABOLIC PANEL
ALT: 9 U/L (ref 0–53)
AST: 18 U/L (ref 0–37)
Alkaline Phosphatase: 61 U/L (ref 39–117)
CO2: 26 mEq/L (ref 19–32)
Chloride: 101 mEq/L (ref 96–112)
Creatinine, Ser: 0.86 mg/dL (ref 0.50–1.35)
GFR calc non Af Amer: >90 mL/min (ref >90)
Potassium: 3.5 mEq/L (ref 3.5–5.1)
Total Bilirubin: 0.3 mg/dL (ref 0.3–1.2)

## 2011-08-31 LAB — CBC
HCT: 33.6 % — ABNORMAL LOW (ref 39.0–52.0)
MCV: 85.1 fL (ref 78.0–100.0)
Platelets: 167 10*3/uL (ref 150–400)
RBC: 3.95 MIL/uL — ABNORMAL LOW (ref 4.22–5.81)
WBC: 4.7 10*3/uL (ref 4.0–10.5)

## 2011-08-31 SURGERY — COLONOSCOPY
Anesthesia: Moderate Sedation

## 2011-08-31 MED ORDER — LISINOPRIL 40 MG PO TABS
40.0000 mg | ORAL_TABLET | Freq: Every day | ORAL | Status: DC
  Administered 2011-09-01: 40 mg via ORAL
  Filled 2011-08-31: qty 1

## 2011-08-31 MED ORDER — FENTANYL CITRATE 0.05 MG/ML IJ SOLN
INTRAMUSCULAR | Status: AC
  Filled 2011-08-31: qty 4

## 2011-08-31 MED ORDER — HYDRALAZINE HCL 10 MG PO TABS
10.0000 mg | ORAL_TABLET | Freq: Once | ORAL | Status: AC
  Administered 2011-08-31: 10 mg via ORAL
  Filled 2011-08-31: qty 1

## 2011-08-31 MED ORDER — SODIUM CHLORIDE 0.9 % IV SOLN
INTRAVENOUS | Status: DC
  Administered 2011-08-31: 500 mL via INTRAVENOUS

## 2011-08-31 MED ORDER — MIDAZOLAM HCL 10 MG/2ML IJ SOLN
INTRAMUSCULAR | Status: AC
  Filled 2011-08-31: qty 4

## 2011-08-31 MED ORDER — METOPROLOL TARTRATE 1 MG/ML IV SOLN
5.0000 mg | Freq: Once | INTRAVENOUS | Status: AC
  Administered 2011-08-31: 5 mg via INTRAVENOUS
  Filled 2011-08-31: qty 5

## 2011-08-31 MED ORDER — FENTANYL NICU IV SYRINGE 50 MCG/ML
INJECTION | INTRAMUSCULAR | Status: DC | PRN
  Administered 2011-08-31 (×5): 25 ug via INTRAVENOUS

## 2011-08-31 MED ORDER — AMLODIPINE BESYLATE 5 MG PO TABS
5.0000 mg | ORAL_TABLET | Freq: Every day | ORAL | Status: DC
  Administered 2011-08-31: 5 mg via ORAL
  Filled 2011-08-31 (×3): qty 1

## 2011-08-31 MED ORDER — HYDRALAZINE HCL 10 MG PO TABS
10.0000 mg | ORAL_TABLET | Freq: Four times a day (QID) | ORAL | Status: DC | PRN
  Administered 2011-08-31: 10 mg via ORAL
  Filled 2011-08-31 (×2): qty 1

## 2011-08-31 MED ORDER — MIDAZOLAM HCL 5 MG/5ML IJ SOLN
INTRAMUSCULAR | Status: DC | PRN
  Administered 2011-08-31: 2 mg via INTRAVENOUS
  Administered 2011-08-31: 2.5 mg via INTRAVENOUS
  Administered 2011-08-31: 2 mg via INTRAVENOUS
  Administered 2011-08-31 (×2): 2.5 mg via INTRAVENOUS

## 2011-08-31 NOTE — Progress Notes (Signed)
Patient ID: Leon Nichols, male   DOB: 07/14/1965, 46 y.o.   MRN: [id number]    Subjective: Pt feels ok.  No new complaints.  Objective: Vital signs in last 24 hours: Temp:  [98.1 F (36.7 C)-99.1 F (37.3 C)] 99.1 F (37.3 C) (05/28 0510) Pulse Rate:  [76-83] 76  (05/28 0510) Resp:  [18-19] 18  (05/28 0510) BP: (155-185)/(98-128) 163/98 mmHg (05/28 0510) SpO2:  [100 %] 100 % (05/28 0510) Last BM Date: 08/30/11  Intake/Output from previous day: 05/27 0701 - 05/28 0700 In: 1926.8 [I.V.:1926.8] Out: 3025 [Urine:3025] Intake/Output this shift: Total I/O In: 0  Out: 225 [Urine:225]  PE: Abd: soft, NT, ND, +BS Buttock: several draining areas perirectally.  Some purulence noted, but nothing to drain. Heart: regular Lungs: CTAB  Lab Results:   Basename 08/31/11 0525 08/30/11 0540  WBC 4.7 4.5  HGB 11.3* 10.9*  HCT 33.6* 33.2*  PLT 167 157   BMET  Basename 08/31/11 0525 08/30/11 0540  NA 136 138  K 3.5 3.3*  CL 101 104  CO2 26 24  GLUCOSE 93 107*  BUN 3* 4*  CREATININE 0.86 0.79  CALCIUM 8.9 8.4   PT/INR No results found for this basename: LABPROT:2,INR:2 in the last 72 hours CMP     Component Value Date/Time   NA 136 08/31/2011 0525   K 3.5 08/31/2011 0525   CL 101 08/31/2011 0525   CO2 26 08/31/2011 0525   GLUCOSE 93 08/31/2011 0525   BUN 3* 08/31/2011 0525   CREATININE 0.86 08/31/2011 0525   CALCIUM 8.9 08/31/2011 0525   PROT 7.0 08/31/2011 0525   ALBUMIN 2.6* 08/31/2011 0525   AST 18 08/31/2011 0525   ALT 9 08/31/2011 0525   ALKPHOS 61 08/31/2011 0525   BILITOT 0.3 08/31/2011 0525   GFRNONAA >90 08/31/2011 0525   GFRAA >90 08/31/2011 0525   Lipase  No results found for this basename: lipase       Studies/Results: Ct Abdomen Pelvis W Contrast  08/29/2011  *RADIOLOGY REPORT*  Clinical Data: Boils in the pelvic area.  CT ABDOMEN AND PELVIS WITH CONTRAST  Technique:  Multidetector CT imaging of the abdomen and pelvis was performed following the standard  protocol during bolus administration of intravenous contrast. Oral and rectal contrast was given for this examination.  Contrast: OMNIPAQUE IOHEXOL 300 MG/ML  SOLN  Comparison: None.  Findings: Lungs clear with exception of dependent atelectasis. There is no evidence for free intraperitoneal air.  The gallbladder has an unusual appearance and may be within the liver parenchyma or capsule.  The gallbladder is very small and the tip may be folded on itself.  Difficult to exclude a small cystic structure near the tip of the gallbladder.  There is no evidence for acute inflammation.  Main portal venous system appears to be patent.  Normal appearance of the pancreas, adrenal glands, kidneys and spleen.  There are multiple lymph nodes throughout the inguinal regions bilaterally. There are prominent retroperitoneal nodes in the pelvis.  Index node along the right pelvic sidewall measures 1.1 x 1.5 cm on sequence 2, image 79.  There are small scattered lymph nodes along the iliac chains bilaterally.  There is wall thickening of the lower left colon and sigmoid colon. There is also edema and stranding in the sigmoid mesocolon.  There are prominent lymph node adjacent to the inflamed colon on sequence 2, image 58.  Additional lymph nodes on image 52.  The rectal contrast stops at the level  of this colonic wall thickening.  There is a cutaneous ulcer and fissure along the left side of the back that extends towards the coccyx bone.  There is a small amount of soft tissue just anterior to the coccyx bone which could be inflammatory.  There is no evidence for a drainable abscess collection.  There is no significant free fluid.  No gross abnormality to the prostate, seminal vessels or urinary bladder.  Degenerative changes at L5-S1.  IMPRESSION: Wall thickening of the sigmoid colon and lower descending colon. There is adjacent inflammation and lymphadenopathy.  The findings could be related to acute diverticulitis or  colitis.  However, the adjacent lymph nodes also raise concern for an underlying neoplasm. Recommend GI consultation.  Cutaneous ulceration and fissure that extends towards the coccyx. Small amount of soft tissue just anterior to the coccyx may be inflammatory.  There are no drainable abscess collections.  Unusual appearance of the gallbladder.  This could represent a gallbladder variant such as an intrahepatic gallbladder.  There is no evidence for acute inflammation.  This area may be further evaluated with ultrasound.  These results were called by telephone on 08/29/2011  at  12:38 p.m. to   [facility], who verbally acknowledged these results.  Original Report Authenticated By: Richarda Overlie, M.D.    Anti-infectives: Anti-infectives     Start     Dose/Rate Route Frequency Ordered Stop   08/29/11 1600   metroNIDAZOLE (FLAGYL) IVPB 500 mg        500 mg 100 mL/hr over 60 Minutes Intravenous Every 8 hours 08/29/11 1455     08/29/11 1600   cefTRIAXone (ROCEPHIN) 1 g in dextrose 5 % 50 mL IVPB        1 g 100 mL/hr over 30 Minutes Intravenous Every 24 hours 08/29/11 1455     08/29/11 1230   Ampicillin-Sulbactam (UNASYN) 3 g in sodium chloride 0.9 % 100 mL IVPB        3 g 100 mL/hr over 60 Minutes Intravenous  Once 08/29/11 1227 08/29/11 1343           Assessment/Plan  1. Colitis with perirectal ?fistulas 2. HTN  Plan: 1. Plan for colonoscopy today per GI to evaluate for inflammatory bowel disease. 2. patient is currently on his home dose of HCTZ and Lisinopril along with a prn dose of lopressor with  BPs still in 160-170/90-100s.  I will ask the hospitalist to see the patient for further recommendations.  The patient works outside and would like to get off the HCTZ because it causes him dehydration and increased urinary output.  I told him I would defer that to the medicine doctors when they see him.    LOS: 2 days    Jaycey Gens E 08/31/2011

## 2011-08-31 NOTE — Progress Notes (Signed)
2130 B/p 163/98 Metoprolol 5mg  IV give for b/p 163/98.

## 2011-08-31 NOTE — Progress Notes (Signed)
2350 B/P 152/128 HR 76 0009 Dr Barbaraann Faster notified order received. 0015 metoprolol 5mg  IV given 0200 B/P 172/106 Will continue to monitor.

## 2011-08-31 NOTE — Consult Note (Signed)
Requesting physician: Leon Pilot, MD  Reason for consultation: Management of Uncontrolled hypertension   History of Present Illness: [name] is a 46 year old African American male with history of hypertension for past 11 years on blood pressure medications with poor compliance who was admitted to general surgery service for recurrent perianal ulcers with drainage and was being treated with a course of antibiotics. His symptoms did not improve he came to the emergency room and a CT scan of the abdomen and pelvis showed inflammation of the proximal sigmoid colon consistent with colitis versus mass. It also showed a cutaneous ulcer and fissure along her gluteal cleft extending from the perianal region up towards the coccyx with inflammation. No abscess was seen. Admitted to surgical service with IV antibiotic and bowel rest. GI was consulted and had a colonoscopy done this afternoon. His colonoscopy done today showed a normal terminal ileum with moderate diverticulosis throughout the colon with probable segmental diverticular colitis not suggestive of Crohn's.  Patient also having issues with uncontrolled blood pressure during the hospital stay with his blood pressure going as high as 205 systolic and 140 diastolic. At home patient is on HCTZ 12.5 mg daily and lisinopril 20 mg daily. He is not compliant with his blood pressure medications. He does not have a regular PCP and follows up at random outside clinics and urgent care for his blood pressure medicine. Informs taking his medications few times a week only. He is primarily not compliant with HCTZ because it makes him urinate a lot. She denies any chest pain, palpitations, shortness of breath, headache, blurry vision, dizziness, abdominal pain, dysuria, hematuria and tingling or numbness of his extremities. Denies any bowel symptoms.   Patient is an active alcoholic and had drinks heavily at least 3 times a week and smokes 5-7 cigarettes a  day. Denies any substance abuse.  Allergies:  No Known Allergies    Past Medical History  Diagnosis Date  . Hypertension     History reviewed. No pertinent past surgical history.  Family history Mother Had diabetes  Medications:  Scheduled Meds:   . amLODipine  5 mg Oral Daily  . cefTRIAXone (ROCEPHIN)  IV  1 g Intravenous Q24H  . enoxaparin  40 mg Subcutaneous Q24H  . lisinopril  40 mg Oral Daily  . metoprolol  5 mg Intravenous Once  . metronidazole  500 mg Intravenous Q8H  . pantoprazole (PROTONIX) IV  40 mg Intravenous QHS  . polyethylene glycol-electrolytes  4,000 mL Oral Once  . potassium chloride  10 mEq Intravenous Q1 Hr x 2  . DISCONTD: hydrochlorothiazide  12.5 mg Oral Daily  . DISCONTD: lisinopril  20 mg Oral Daily   Continuous Infusions:   . dextrose 5 % and 0.45 % NaCl with KCl 20 mEq/L 50 mL/hr at 08/31/11 1417  . DISCONTD: sodium chloride 500 mL (08/31/11 1233)   PRN Meds:.hydrALAZINE, metoprolol, morphine injection, ondansetron, oxyCODONE, DISCONTD: fentaNYL, DISCONTD: midazolam  Social History:  reports that he has been smoking Cigarettes,5- 7 a day .  He reports that he drinks alcohol. History of illicit drugs (Marijuana and Cocaine), quit about one year ago.  Family History  Problem Relation Age of Onset  . Malignant hyperthermia Neg Hx     Review of Systems:  Constitutional: Denies fever, chills, diaphoresis, appetite change and fatigue.  HEENT: Denies photophobia, eye pain, redness, hearing loss, ear pain, congestion, sore throat, rhinorrhea, sneezing, mouth sores, trouble swallowing, neck pain, neck stiffness and tinnitus.   Respiratory: Denies  SOB, DOE, cough, chest tightness,  and wheezing.   Cardiovascular: Denies chest pain, palpitations and leg swelling.  Gastrointestinal: Denies nausea, vomiting, abdominal pain, diarrhea, constipation, blood in stool and abdominal distention.  Genitourinary: Denies dysuria, urgency, frequency,  hematuria, flank pain and difficulty urinating.  has chronic perineal ulcers with drainage. Musculoskeletal: Denies myalgias, back pain, joint swelling, arthralgias and gait problem.  Skin: Denies pallor, rash and wound.  Neurological: Denies dizziness, seizures, syncope, weakness, light-headedness, numbness and headaches.  Hematological: Denies adenopathy. Easy bruising, personal or family bleeding history  Psychiatric/Behavioral: Denies suicidal ideation, mood changes, confusion, nervousness, sleep disturbance and agitation   Physical Exam:  Filed Vitals:   08/31/11 1340 08/31/11 1350 08/31/11 1353 08/31/11 1445  BP: 178/109 163/100 163/100 166/99  Pulse:    82  Temp:    98.2 F (36.8 C)  TempSrc:    Oral  Resp: 21 31 22 20   Height:      Weight:      SpO2: 99% 100%  100%     Intake/Output Summary (Last 24 hours) at 08/31/11 1615 Last data filed at 08/31/11 1417  Gross per 24 hour  Intake   2641 ml  Output   2125 ml  Net    516 ml    General: Alert, awake, oriented x3, in no acute distress. HEENT: No bruits, no goiter. Heart: Regular rate and rhythm, without murmurs, rubs, gallops. Lungs: Clear to auscultation bilaterally. Abdomen: Soft, nontender, nondistended, positive bowel sounds.several draining areas perirectally with some purulence Extremities: No clubbing cyanosis or edema with positive pedal pulses. Neuro: Grossly intact, nonfocal.  Labs on Admission:  CBC:    Component Value Date/Time   WBC 4.7 08/31/2011 0525   HGB 11.3* 08/31/2011 0525   HCT 33.6* 08/31/2011 0525   PLT 167 08/31/2011 0525   MCV 85.1 08/31/2011 0525   NEUTROABS 3.6 08/29/2011 0743   LYMPHSABS 1.1 08/29/2011 0743   MONOABS 0.4 08/29/2011 0743   EOSABS 0.1 08/29/2011 0743   BASOSABS 0.0 08/29/2011 0743    Basic Metabolic Panel:    Component Value Date/Time   NA 136 08/31/2011 0525   K 3.5 08/31/2011 0525   CL 101 08/31/2011 0525   CO2 26 08/31/2011 0525   BUN 3* 08/31/2011 0525   CREATININE  0.86 08/31/2011 0525   GLUCOSE 93 08/31/2011 0525   CALCIUM 8.9 08/31/2011 0525    Radiological Exams on Admission: No results found.  Assessment 46 year old [place] American male with history of hypertension for possibly 11 years with noncompliance who was admitted to surgical service for ongoing perianal ulcers and concerning sigmoid mass on CT abdomen and pelvis.  hospitalist team  consulted for management of his severe uncontrolled hypertension.  Recommendations:  Uncontrolled BP - Patient has significant noncompliance with his blood pressure medications especially HCTZ that makes him urinate a lot. -His blood pressure have been extremely high for past 24 hours. -I will stop his HCTZ completely as he will remain noncompliant with it. -i will increase his lisinopril dose to 40 mg once a day. -I will start him on a low-dose amlodipine at 5 mg once a day and increase dose to 10 mg if needed. -I Will also add hydralazine 10 mg every 6 hours as needed for elevated blood pressure. Titrated dose as necessary. -The blood pressure remains controlled on this regimen, he can be discharged on adjusted dose of lisinopril and hydralazine. -Both lisinopril and hydralazine are available as of $4 prescriptions in [facility] and it would be  cheaper for him to buy them as outpatient. -Monitor blood pressure closely. -We'll check a baseline EKG -UA on admission was unremarkable  Perirectal/perianal  ulcers with sigmoid colitis  plan per primary team    Patient counseled strongly on Smoking and alcohol cessation. No signs of withdrawal noted. Counseled on dietary restriction with low sodium diet and compliance with his blood pressure medications  Diet: Change to low sodium  thank you for the consult. medical team will continue to follow  Time Spent on Admission: 50 minutes  Saanvika Vazques 08/31/2011, 4:15 PM

## 2011-08-31 NOTE — Op Note (Signed)
[facility] [address] [place], [place]  [address]  COLONOSCOPY PROCEDURE REPORT  PATIENT:  Leon Nichols, Leon Nichols  MR#:  [id number] BIRTHDATE:  12-18-1965, 45 yrs. old  GENDER:  male ENDOSCOPIST:  Wilhemina Bonito. Eda Keys, MD REF. BY:  Violeta Gelinas, M.D. PROCEDURE DATE:  08/31/2011 PROCEDURE:  Colonoscopy with biopsies ASA CLASS:  Class II INDICATIONS:  Abnormal CT of abdomen ; R/O IBD VS MASS MEDICATIONS:   Fentanyl 125 mcg IV, Versed 11 mg IV  DESCRIPTION OF PROCEDURE:   After the risks benefits and alternatives of the procedure were thoroughly explained, informed consent was obtained.  Digital rectal exam was performed and revealed no abnormalities.   The Pentax Colonoscope [id number] and [id number]) endoscope was introduced through the anus and advanced to the cecum, which was identified by both the appendix and ileocecal valve, without limitations.  The quality of the prep was good, using MoviPrep.  The instrument was then slowly withdrawn as the colon was fully examined. <<PROCEDUREIMAGES>>  FINDINGS:  The terminal ileum appeared normal.  Moderate diverticulosis was found throughout the colon. There was erythema, edeman and a prominent fold in the lft colon from 33-40cm. The changes looked most consistent with benign diverticular colitis (as opposed to Crohn's).  Multiple bx taken. Retroflexed views in the rectum revealed internal hemorrhoids.    The scope was then withdrawn  from the cecum and the procedure completed. COMPLICATIONS:  None ENDOSCOPIC IMPRESSION: 1) Normal terminal ileum 2) Moderate diverticulosis throughout the colon with probable segmental diverticular colitis (NOTCROHN'S) 3) Internal hemorrhoids  RECOMMENDATIONS: 1) Await biopsy results 2) Treatment of perirectal / buttock lesions per [facility]  ______________________________ Wilhemina Bonito. Eda Keys, MD  CC:  The Patient; Alexander Mt  n. eSIGNED:   Wilhemina Bonito. Eda Keys at 08/31/2011 01:35  PM  Mervyn Gay, [phone]

## 2011-08-31 NOTE — Progress Notes (Signed)
Colonoscopy to eval for IBD

## 2011-08-31 NOTE — Interval H&P Note (Signed)
History and Physical Interval Note: NO CLINICAL CHANGE SINCE YESTERDAY. EXAM AS ABOVE. 08/31/2011 12:40 PM  Leon Nichols  has presented today for surgery, with the diagnosis of r/o colitis  The various methods of treatment have been discussed with the patient and family. After consideration of risks, benefits and other options for treatment, the patient has consented to  Procedure(s) (LRB): COLONOSCOPY (N/A) as a surgical intervention .  The patients' history has been reviewed, patient examined, no change in status, stable for surgery.  I have reviewed the patients' chart and labs.  Questions were answered to the patient's satisfaction.     [name]

## 2011-09-01 ENCOUNTER — Encounter (HOSPITAL_COMMUNITY): Payer: Self-pay | Admitting: Internal Medicine

## 2011-09-01 ENCOUNTER — Encounter: Payer: Self-pay | Admitting: Internal Medicine

## 2011-09-01 DIAGNOSIS — K612 Anorectal abscess: Secondary | ICD-10-CM

## 2011-09-01 DIAGNOSIS — I10 Essential (primary) hypertension: Secondary | ICD-10-CM

## 2011-09-01 DIAGNOSIS — K626 Ulcer of anus and rectum: Secondary | ICD-10-CM

## 2011-09-01 LAB — DRUG SCREEN PANEL (SERUM)

## 2011-09-01 LAB — HERPES SIMPLEX VIRUS CULTURE

## 2011-09-01 MED ORDER — CIPROFLOXACIN HCL 500 MG PO TABS: 500.0000 mg | ORAL_TABLET | Freq: Two times a day (BID) | ORAL | Status: AC

## 2011-09-01 MED ORDER — HYDRALAZINE HCL 10 MG PO TABS
10.0000 mg | ORAL_TABLET | Freq: Four times a day (QID) | ORAL | Status: DC
  Administered 2011-09-01: 10 mg via ORAL
  Filled 2011-09-01 (×4): qty 1

## 2011-09-01 MED ORDER — AMLODIPINE BESYLATE 10 MG PO TABS: 10.0000 mg | ORAL_TABLET | Freq: Every day | ORAL | Status: DC

## 2011-09-01 MED ORDER — LISINOPRIL 40 MG PO TABS: 40.0000 mg | ORAL_TABLET | Freq: Every day | ORAL | Status: DC

## 2011-09-01 MED ORDER — METRONIDAZOLE 500 MG PO TABS: 500.0000 mg | ORAL_TABLET | Freq: Three times a day (TID) | ORAL | Status: AC

## 2011-09-01 MED ORDER — AMLODIPINE BESYLATE 10 MG PO TABS
10.0000 mg | ORAL_TABLET | Freq: Every day | ORAL | Status: DC
Start: 2011-09-01 — End: 2011-09-01
  Administered 2011-09-01: 10 mg via ORAL
  Filled 2011-09-01: qty 1

## 2011-09-01 MED ORDER — OXYCODONE HCL 5 MG PO TABS: 5.0000 mg | ORAL_TABLET | ORAL | Status: AC | PRN

## 2011-09-01 NOTE — Progress Notes (Signed)
     [facility] Daily Rounding Note 09/01/2011, 8:45 AM  SUBJECTIVE:       No abd pain.  Small stool after dinner last night.  Buttocks sores still painful but overall better. Does not ever c/o nausea or heartburn  OBJECTIVE:        General: looks well     Vital signs in last 24 hours:    Temp:  [98.1 F (36.7 C)-99 F (37.2 C)] 98.4 F (36.9 C) (05/29 0501) Pulse Rate:  [67-82] 67  (05/29 0501) Resp:  [13-31] 18  (05/29 0501) BP: (157-205)/(99-145) 157/100 mmHg (05/29 0501) SpO2:  [96 %-100 %] 100 % (05/29 0501) Last BM Date: 08/31/11  Heart: RRR Chest: clear B .  No SOB or cough Abdomen: soft, NT, ND.  No mass  Extremities: no pedal edema Neuro/Psych:  Pleasant and relaxed.  Intake/Output from previous day: 05/28 0701 - 05/29 0700 In: 1860 [P.O.:360; I.V.:1500] Out: 1375 [Urine:1375]  Intake/Output this shift:    Lab Results:  Basename 08/31/11 0525 08/30/11 0540  WBC 4.7 4.5  HGB 11.3* 10.9*  HCT 33.6* 33.2*  PLT 167 157   BMET  Basename 08/31/11 0525 08/30/11 0540  NA 136 138  K 3.5 3.3*  CL 101 104  CO2 26 24  GLUCOSE 93 107*  BUN 3* 4*  CREATININE 0.86 0.79  CALCIUM 8.9 8.4   LFT  Basename 08/31/11 0525  PROT 7.0  ALBUMIN 2.6*  AST 18  ALT 9  ALKPHOS 61  BILITOT 0.3  BILIDIR --  IBILI --   ASSESMENT: *  CT raising concern for segmental colitis Colonoscopy 5/28:  Segmental diverticular colitis, not as likely Crohn's.  *  Buttock's skin lesions, chronic. On Rocephin and Flagyl   PLAN: *  Await biopsy/pathology reading from colon * d/c the IV protonix, does not seem necessary for a pt with no reflux or n/v   LOS: 3 days   Jennye Moccasin  09/01/2011, 8:45 AM Pager: [phone]   GI ATTENDING  AS ABOVE. PATHOLOGY REVIEWED. NO PATHOGNOMONIC CHANGES OF CROHN'S, THOUGH CAN NOT ENTIRELY EXCLUDE. ENDOSCOPICALLY IT LOOKS LIKE DIVERTICULAR COLITIS. AT THIS POINT, TREAT HIS BUTTOCK / PERI SACRAL DISEASE AND MONITOR CLINICALLY. COULD CONSIDER IBD  SEROLOGY / FOLLOW UP IMAGING IF NEEDED. I WOULD LIKE TO SEE HIM IN MY OFFICE IN 4-6 WEEKS.  Wilhemina Bonito. Eda Keys., M.D. [facility] Division of Gastroenterology

## 2011-09-01 NOTE — Discharge Summary (Signed)
Follow up as outpatient to discuss fistula treatment.

## 2011-09-01 NOTE — Discharge Instructions (Signed)
Colitis Colitis is inflammation of the colon. Colitis can be a short-term or long-standing (chronic) illness. Crohn's disease and ulcerative colitis are 2 types of colitis which are chronic. They usually require lifelong treatment. CAUSES  There are many different causes of colitis, including:  Viruses.   Germs (bacteria).   Medicine reactions.  SYMPTOMS   Diarrhea.   Intestinal bleeding.   Pain.   Fever.   Throwing up (vomiting).   Tiredness (fatigue).   Weight loss.   Bowel blockage.  DIAGNOSIS  The diagnosis of colitis is based on examination and stool or blood tests. X-rays, CT scan, and colonoscopy may also be needed. TREATMENT  Treatment may include:  Fluids given through the vein (intravenously).   Bowel rest (nothing to eat or drink for a period of time).   Medicine for pain and diarrhea.   Medicines (antibiotics) that kill germs.   Cortisone medicines.   Surgery.  HOME CARE INSTRUCTIONS   Get plenty of rest.   Drink enough water and fluids to keep your urine clear or pale yellow.   Eat a well-balanced diet.   Call your caregiver for follow-up as recommended.  SEEK IMMEDIATE MEDICAL CARE IF:   You develop chills.   You have an oral temperature above 102 F (38.9 C), not controlled by medicine.   You have extreme weakness, fainting, or dehydration.   You have repeated vomiting.   You develop severe belly (abdominal) pain or are passing bloody or tarry stools.  MAKE SURE YOU:   Understand these instructions.   Will watch your condition.   Will get help right away if you are not doing well or get worse.  Document Released: 04/29/2004 Document Revised: 03/11/2011 Document Reviewed: 07/25/2009 Exit[employer] Patient Information 2012 [employer]. 

## 2011-09-01 NOTE — Progress Notes (Signed)
Patient discharged to home as with prescriptions and some information to read regarding patient's problems, wife was present while health teaching was given, verbalized understanding.

## 2011-09-01 NOTE — Discharge Summary (Signed)
Patient ID: Leon Nichols MRN: [id number] DOB/AGE: November 23, 1965 46 y.o.  Admit date: 08/29/2011 Discharge date: 09/01/2011  Procedures: colonoscopy  Consults: GI and internal medicine  Reason for Admission: This is a 46 yo male who has a history of perianal ulcers that occasionally drain.  He got a CT scan which showed some sigmoid colitis as well.  He was admitted for further work up to rule out IBD and to treat his colitis.  Please see admitting H&P for further details.   Admission Diagnoses:  1. Colitis 2. Chronic perianal ulcers 3. HTN  Hospital Course: The patient was admitted and placed on Rocephin and Flagyl for his colitis.  Given his perianal ulcers and colitis GI was consulted for colonoscopy to evaluate for IBD.  A colonoscopy was performed which did not show evidence of crohn's disease, but of colitis.  This area was biopsied and currently pathology is still pending.  The patient is feeling better, but still having some drainage.  He would like to go home.  He is tolerating a regular diet and has no abdominal pain.  He will be switched to po cipro and flagyl and follow up in our office for evaluation for EUA to further rule out a fistula that is causing his chronic perianal ulcers.  The patient also had uncontrolled HTN during this admission.  IM was consulted and his BP medications were adjusted.  His lisinopril was increased and amlodipine was added.  He will follow up with his PCP for further control of his HTN.  Discharge Diagnoses:  Active Problems:  Colitis  Nonspecific (abnormal) findings on radiological and other examination of gastrointestinal tract  Diverticulosis of colon (without mention of hemorrhage) chronic perianal ulcers Uncontrolled HTN  Discharge Medications: Medication List  As of 09/01/2011 10:42 AM   STOP taking these medications         lisinopril-hydrochlorothiazide 20-12.5 MG per tablet         TAKE these medications         amLODipine 10 MG  tablet   Commonly known as: NORVASC   Take 1 tablet (10 mg total) by mouth daily.      ciprofloxacin 500 MG tablet   Commonly known as: CIPRO   Take 1 tablet (500 mg total) by mouth 2 (two) times daily.      ibuprofen 200 MG tablet   Commonly known as: ADVIL,MOTRIN   Take 800-1,000 mg by mouth every 6 (six) hours as needed. For pain      lisinopril 40 MG tablet   Commonly known as: PRINIVIL,ZESTRIL   Take 1 tablet (40 mg total) by mouth daily.      metroNIDAZOLE 500 MG tablet   Commonly known as: FLAGYL   Take 1 tablet (500 mg total) by mouth 3 (three) times daily.      oxyCODONE 5 MG immediate release tablet   Commonly known as: Oxy IR/ROXICODONE   Take 1 tablet (5 mg total) by mouth every 4 (four) hours as needed.            Discharge Instructions: Follow-up Information    Follow up with [facility], MD. Schedule an appointment as soon as possible for a visit in 2 weeks.   Contact information:   [facility] [address] [place] [address] [phone]    Follow up with primary care doctor for high blood pressure      Signed: Natasa Stigall E 09/01/2011, 10:42 AM

## 2011-09-01 NOTE — Progress Notes (Signed)
Subjective: Feeling fine No new c/o   Objective: Vital signs in last 24 hours: Filed Vitals:   08/31/11 2041 08/31/11 2138 09/01/11 0145 09/01/11 0501  BP: 164/104 169/109 159/102 157/100  Pulse: 80 79 79 67  Temp:  98.4 F (36.9 C) 98.1 F (36.7 C) 98.4 F (36.9 C)  TempSrc:  Oral Oral Oral  Resp:  20 18 18   Height:      Weight:      SpO2:  100% 100% 100%   Weight change:   Intake/Output Summary (Last 24 hours) at 09/01/11 1036 Last data filed at 09/01/11 0600  Gross per 24 hour  Intake   1860 ml  Output   1150 ml  Net    710 ml    Physical Exam: General: Awake, Oriented, No acute distress. HEENT: EOMI. Neck: Supple CV: S1 and S2 Lungs: Clear to ascultation bilaterally Abdomen: Soft, Nontender, Nondistended, +bowel sounds, obese Ext: Good pulses. Trace edema.   Lab Results:  Basename 08/31/11 0525 08/30/11 0540  NA 136 138  K 3.5 3.3*  CL 101 104  CO2 26 24  GLUCOSE 93 107*  BUN 3* 4*  CREATININE 0.86 0.79  CALCIUM 8.9 8.4  MG -- --  PHOS -- --    Basename 08/31/11 0525  AST 18  ALT 9  ALKPHOS 61  BILITOT 0.3  PROT 7.0  ALBUMIN 2.6*   No results found for this basename: LIPASE:2,AMYLASE:2 in the last 72 hours  Basename 08/31/11 0525 08/30/11 0540  WBC 4.7 4.5  NEUTROABS -- --  HGB 11.3* 10.9*  HCT 33.6* 33.2*  MCV 85.1 86.2  PLT 167 157   No results found for this basename: CKTOTAL:3,CKMB:3,CKMBINDEX:3,TROPONINI:3 in the last 72 hours No components found with this basename: POCBNP:3 No results found for this basename: DDIMER:2 in the last 72 hours No results found for this basename: HGBA1C:2 in the last 72 hours No results found for this basename: CHOL:2,HDL:2,LDLCALC:2,TRIG:2,CHOLHDL:2,LDLDIRECT:2 in the last 72 hours No results found for this basename: TSH,T4TOTAL,FREET3,T3FREE,THYROIDAB in the last 72 hours No results found for this basename: VITAMINB12:2,FOLATE:2,FERRITIN:2,TIBC:2,IRON:2,RETICCTPCT:2 in the last 72 hours  Micro  Results: No results found for this or any previous visit (from the past 240 hour(s)).  Studies/Results: No results found.  Medications: I have reviewed the patient's current medications. Scheduled Meds:   . amLODipine  10 mg Oral Daily  . cefTRIAXone (ROCEPHIN)  IV  1 g Intravenous Q24H  . enoxaparin  40 mg Subcutaneous Q24H  . hydrALAZINE  10 mg Oral Once  . hydrALAZINE  10 mg Oral Q6H  . lisinopril  40 mg Oral Daily  . metronidazole  500 mg Intravenous Q8H  . DISCONTD: amLODipine  5 mg Oral Daily  . DISCONTD: hydrochlorothiazide  12.5 mg Oral Daily  . DISCONTD: lisinopril  20 mg Oral Daily  . DISCONTD: pantoprazole (PROTONIX) IV  40 mg Intravenous QHS   Continuous Infusions:   . dextrose 5 % and 0.45 % NaCl with KCl 20 mEq/L 50 mL/hr at 08/31/11 1417  . DISCONTD: sodium chloride 500 mL (08/31/11 1233)   PRN Meds:.morphine injection, ondansetron, oxyCODONE, DISCONTD: fentaNYL, DISCONTD: hydrALAZINE, DISCONTD: metoprolol, DISCONTD: midazolam  Assessment/Plan: Uncontrolled BP  - Patient has significant noncompliance with his blood pressure medications especially HCTZ that makes him urinate a lot.  -His blood pressure have been extremely high for past 24 hours.  -stop his HCTZ completely as he will remain noncompliant with it.  -increase his lisinopril dose to 40 mg once a day.  -  started on a low-dose amlodipine at 5 mg once a day and increase dose to 10 mg  -add hydralazine 10 mg every 6 hours Titrated dose as necessary.  -The blood pressure remains controlled on this regimen, he can be discharged on adjusted dose of lisinopril and hydralazine.  -Both lisinopril and hydralazine are available as of $4 prescriptions and it would be cheaper for him to buy them as outpatient.  -Monitor blood pressure closely.  -UA on admission was unremarkable   Perirectal/perianal ulcers with sigmoid colitis  plan per primary team  Patient counseled strongly on Smoking and alcohol cessation. No  signs of withdrawal noted.  Counseled on dietary restriction with low sodium diet and compliance with his blood pressure medications     LOS: 3 days  Kelten Enochs, DO 09/01/2011, 10:36 AM

## 2011-09-23 ENCOUNTER — Emergency Department (HOSPITAL_COMMUNITY): Payer: Self-pay

## 2011-09-23 ENCOUNTER — Inpatient Hospital Stay (HOSPITAL_COMMUNITY)
Admission: EM | Admit: 2011-09-23 | Discharge: 2011-09-27 | DRG: 866 | Disposition: A | Discharge status: Home / Self Care | Payer: MEDICAID | Attending: Internal Medicine | Admitting: Internal Medicine

## 2011-09-23 ENCOUNTER — Encounter (HOSPITAL_COMMUNITY): Payer: Self-pay | Admitting: *Deleted

## 2011-09-23 DIAGNOSIS — I10 Essential (primary) hypertension: Secondary | ICD-10-CM | POA: Diagnosis present

## 2011-09-23 DIAGNOSIS — F1210 Cannabis abuse, uncomplicated: Secondary | ICD-10-CM | POA: Diagnosis present

## 2011-09-23 DIAGNOSIS — K5730 Diverticulosis of large intestine without perforation or abscess without bleeding: Secondary | ICD-10-CM | POA: Diagnosis present

## 2011-09-23 DIAGNOSIS — B028 Zoster with other complications: Principal | ICD-10-CM | POA: Diagnosis present

## 2011-09-23 DIAGNOSIS — B0221 Postherpetic geniculate ganglionitis: Secondary | ICD-10-CM | POA: Diagnosis present

## 2011-09-23 DIAGNOSIS — B029 Zoster without complications: Secondary | ICD-10-CM

## 2011-09-23 DIAGNOSIS — F1410 Cocaine abuse, uncomplicated: Secondary | ICD-10-CM | POA: Diagnosis present

## 2011-09-23 DIAGNOSIS — N390 Urinary tract infection, site not specified: Secondary | ICD-10-CM | POA: Diagnosis present

## 2011-09-23 DIAGNOSIS — K5289 Other specified noninfective gastroenteritis and colitis: Secondary | ICD-10-CM | POA: Diagnosis present

## 2011-09-23 DIAGNOSIS — K529 Noninfective gastroenteritis and colitis, unspecified: Secondary | ICD-10-CM | POA: Diagnosis present

## 2011-09-23 DIAGNOSIS — F17200 Nicotine dependence, unspecified, uncomplicated: Secondary | ICD-10-CM | POA: Diagnosis present

## 2011-09-23 LAB — COMPREHENSIVE METABOLIC PANEL
ALT: 8 U/L (ref 0–53)
AST: 14 U/L (ref 0–37)
Albumin: 3.7 g/dL (ref 3.5–5.2)
Alkaline Phosphatase: 57 U/L (ref 39–117)
CO2: 26 mEq/L (ref 19–32)
Chloride: 93 mEq/L — ABNORMAL LOW (ref 96–112)
Creatinine, Ser: 0.89 mg/dL (ref 0.50–1.35)
GFR calc non Af Amer: >90 mL/min (ref >90)
Potassium: 4.1 mEq/L (ref 3.5–5.1)
Total Bilirubin: 0.3 mg/dL (ref 0.3–1.2)

## 2011-09-23 LAB — CBC
HCT: 40.9 % (ref 39.0–52.0)
MCV: 83.5 fL (ref 78.0–100.0)
RBC: 4.9 MIL/uL (ref 4.22–5.81)
WBC: 6 10*3/uL (ref 4.0–10.5)

## 2011-09-23 LAB — DIFFERENTIAL
Eosinophils Relative: 0 % (ref 0–5)
Lymphocytes Relative: 16 % (ref 12–46)
Lymphs Abs: 0.9 10*3/uL (ref 0.7–4.0)
Monocytes Absolute: 0.7 10*3/uL (ref 0.1–1.0)
Neutro Abs: 4.3 10*3/uL (ref 1.7–7.7)

## 2011-09-23 LAB — PROCALCITONIN: Procalcitonin: <0.1 ng/mL

## 2011-09-23 NOTE — ED Notes (Signed)
Pt stated that his ear "opens and closes." Pt states swelling is all in face. Denies any trouble swallowing or breathing. Denies any oral swelling.

## 2011-09-23 NOTE — ED Notes (Signed)
MD informed that pt c/o pain 6-7/10

## 2011-09-23 NOTE — ED Notes (Addendum)
Urine collected and sent to the lab, pt ambulated to the restroom without assistance

## 2011-09-23 NOTE — ED Provider Notes (Signed)
History     CSN: [id number]  Arrival date & time 09/23/11  1714   First MD Initiated Contact with Patient 09/23/11 2135      Chief Complaint  Patient presents with  . Facial Swelling  . Allergic Reaction    (Consider location/radiation/quality/duration/timing/severity/associated sxs/prior treatment) HPI Comments: Patient is a 46 year old man who says that he noted pain and swelling in the left ureter last Saturday, 5 days ago. It is gradually gotten worse, and a rash is spreading from his left ear onto his face all the way down to his chin. His skin has multiple papules and the skin is peeling and has drainage in around some of the papules. He's had no treatment for this. He denies fever.: No one else is sick like this this home.  Patient is a 46 y.o. male presenting with ear drainage.  Ear Drainage The current episode started more than 2 days ago. The problem occurs constantly. The problem has been gradually worsening. Pertinent negatives include no chest pain, no abdominal pain, no headaches and no shortness of breath. Nothing aggravates the symptoms. Nothing relieves the symptoms. He has tried nothing for the symptoms.    Past Medical History  Diagnosis Date  . Hypertension     Past Surgical History  Procedure Date  . Colonoscopy 08/31/2011    Procedure: COLONOSCOPY;  Surgeon: Hilarie Fredrickson, MD;  Location: [facility] ENDOSCOPY;  Service: Endoscopy;  Laterality: N/A;    Family History  Problem Relation Age of Onset  . Malignant hyperthermia Neg Hx     History  Substance Use Topics  . Smoking status: Current Everyday Smoker -- 0.2 packs/day for 15 years    Types: Cigarettes  . Smokeless tobacco: Not on file  . Alcohol Use: Yes      Review of Systems  Constitutional: Negative.  Negative for fever and chills.  HENT: Positive for ear pain and facial swelling. Negative for hearing loss.   Eyes: Negative.   Respiratory: Negative.  Negative for shortness of breath.     Cardiovascular: Negative for chest pain.  Gastrointestinal: Negative for abdominal pain.  Genitourinary: Negative.   Musculoskeletal: Negative.   Neurological: Negative.  Negative for headaches.  Psychiatric/Behavioral: Negative.     Allergies  Review of patient's allergies indicates no known allergies.  Home Medications   Current Outpatient Rx  Name Route Sig Dispense Refill  . AMLODIPINE BESYLATE 10 MG PO TABS Oral Take 1 tablet (10 mg total) by mouth daily. 30 tablet 1  . IBUPROFEN 200 MG PO TABS Oral Take 800-1,000 mg by mouth every 6 (six) hours as needed. For pain    . LISINOPRIL 40 MG PO TABS Oral Take 1 tablet (40 mg total) by mouth daily. 30 tablet 1  . METRONIDAZOLE 500 MG PO TABS Oral Take 500 mg by mouth 3 (three) times daily.      BP 149/98  Pulse 106  Temp 99 F (37.2 C) (Oral)  Resp 20  SpO2 98%  Physical Exam  Nursing note and vitals reviewed. Constitutional: He is oriented to person, place, and time. He appears well-developed and well-nourished.  HENT:  Head: Normocephalic.       He has a papular rash that originates in the left ear, but has spread to have fine papules with scaling of skin on the left side of his face with slight swelling of the left eyelids, extending to the  Left side of the jaw and left lower lip.    Eyes: Conjunctivae  and EOM are normal. Pupils are equal, round, and reactive to light.  Neck: Normal range of motion. Neck supple.  Cardiovascular: Normal rate, regular rhythm and normal heart sounds.   Pulmonary/Chest: Effort normal and breath sounds normal.  Abdominal: Soft. Bowel sounds are normal.  Musculoskeletal: Normal range of motion. He exhibits no edema and no tenderness.  Neurological: He is alert and oriented to person, place, and time. He has normal reflexes.       Has left facial weakness.  Otherwise, no sensory or motor deficit.   Skin: Skin is warm and dry.  Psychiatric: He has a normal mood and affect. His behavior is  normal.    ED Course  Procedures (including critical care time)   Labs Reviewed  WOUND CULTURE   10:01 PM Pt seen --> physical exam performed.  Lab workup ordered.    12:37 AM Results for orders placed during the hospital encounter of 09/23/11  CBC      Component Value Range   WBC 6.0  4.0 - 10.5 K/uL   RBC 4.90  4.22 - 5.81 MIL/uL   Hemoglobin 14.5  13.0 - 17.0 g/dL   HCT 36.6  44.0 - 34.7 %   MCV 83.5  78.0 - 100.0 fL   MCH 29.6  26.0 - 34.0 pg   MCHC 35.5  30.0 - 36.0 g/dL   RDW 42.5  95.6 - 38.7 %   Platelets 98 (*) 150 - 400 K/uL  DIFFERENTIAL      Component Value Range   Neutrophils Relative 73  43 - 77 %   Neutro Abs 4.3  1.7 - 7.7 K/uL   Lymphocytes Relative 16  12 - 46 %   Lymphs Abs 0.9  0.7 - 4.0 K/uL   Monocytes Relative 11  3 - 12 %   Monocytes Absolute 0.7  0.1 - 1.0 K/uL   Eosinophils Relative 0  0 - 5 %   Eosinophils Absolute 0.0  0.0 - 0.7 K/uL   Basophils Relative 1  0 - 1 %   Basophils Absolute 0.0  0.0 - 0.1 K/uL  URINALYSIS, ROUTINE W REFLEX MICROSCOPIC      Component Value Range   Color, Urine AMBER (*) YELLOW   APPearance CLOUDY (*) CLEAR   Specific Gravity, Urine 1.031 (*) 1.005 - 1.030   pH 5.5  5.0 - 8.0   Glucose, UA NEGATIVE  NEGATIVE mg/dL   Hgb urine dipstick SMALL (*) NEGATIVE   Bilirubin Urine SMALL (*) NEGATIVE   Ketones, ur 15 (*) NEGATIVE mg/dL   Protein, ur 564 (*) NEGATIVE mg/dL   Urobilinogen, UA 1.0  0.0 - 1.0 mg/dL   Nitrite POSITIVE (*) NEGATIVE   Leukocytes, UA SMALL (*) NEGATIVE  COMPREHENSIVE METABOLIC PANEL      Component Value Range   Sodium 135  135 - 145 mEq/L   Potassium 4.1  3.5 - 5.1 mEq/L   Chloride 93 (*) 96 - 112 mEq/L   CO2 26  19 - 32 mEq/L   Glucose, Bld 110 (*) 70 - 99 mg/dL   BUN 16  6 - 23 mg/dL   Creatinine, Ser 3.32  0.50 - 1.35 mg/dL   Calcium 9.9  8.4 - 95.1 mg/dL   Total Protein 8.9 (*) 6.0 - 8.3 g/dL   Albumin 3.7  3.5 - 5.2 g/dL   AST 14  0 - 37 U/L   ALT 8  0 - 53 U/L   Alkaline  Phosphatase 57  39 -  117 U/L   Total Bilirubin 0.3  0.3 - 1.2 mg/dL   GFR calc non Af Amer >90  >90 mL/min   GFR calc Af Amer >90  >90 mL/min  LACTIC ACID, PLASMA      Component Value Range   Lactic Acid, Venous 1.8  0.5 - 2.2 mmol/L  PROCALCITONIN      Component Value Range   Procalcitonin <0.10    URINE MICROSCOPIC-ADD ON      Component Value Range   Squamous Epithelial / LPF RARE  RARE   WBC, UA 0-2  <3 WBC/hpf   RBC / HPF 0-2  <3 RBC/hpf   Bacteria, UA MANY (*) RARE   Urine-Other MUCOUS PRESENT     Dg Chest 2 View  09/23/2011  *RADIOLOGY REPORT*  Clinical Data: Papulovesicular rash arising in the left ear. Question of Ramsay-Hunt syndrome.  CHEST - 2 VIEW  Comparison: None.  Findings: The lungs are well-aerated and clear.  There is no evidence of focal opacification, pleural effusion or pneumothorax. A left-sided skin fold is noted.  The heart is normal in size; the mediastinal contour is within normal limits.  No acute osseous abnormalities are seen.  IMPRESSION: No acute cardiopulmonary process seen.  Original Report Authenticated By: Tonia Ghent, M.D.   .12:38 AM Lab workup is essentially negative.  Waiting for CT reports.   12:49 AM CT shows only facial swelling.  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  1:24 AM Case discussedwith Dr. Della Goo.  Admit to Team 6 to Dr. Dyann Ruddle to a med-surg bed.  Pharmacy called to dose pt with IV acyclovir.      1. Herpes zoster         Carleene Cooper III, MD 09/24/11 [phone]

## 2011-09-23 NOTE — ED Notes (Signed)
Patient reports onset of whelps on Saturday after cutting his hair.  He now has thick rash and swelling to the left side of his face and ear.  Patient states he cannot hear out of his ear due to swelling.

## 2011-09-23 NOTE — ED Provider Notes (Signed)
This chart was scribed for Gerhard Munch, MD by Bennett Scrape. This patient was seen in room Triage EKG and the patient's care was started at 5:52PM.  Leon Nichols is a 46 y.o. male who presents to the Emergency Department complaining of 5 days of gradual onset, gradually worsening, constant rash described as whelps after cutting his hair. He now has a thick rash and swelling to the left side of his face, tongue and ear. Patient states he cannot hear out of his ear due to swelling. He has not seen a MD for the symptoms prior to today. He denies taking OTC medications at home to improve symptoms. He denies trouble breathing, trouble swalowling, fever, emesis and diarrhea as associated symptoms. He has a h/o HTN. He is a current everyday smoker but denies alcohol use.  This was a medical screening exam.  The patient's care will be continued by another EDP.      Gerhard Munch, MD 09/23/11 [phone]

## 2011-09-24 ENCOUNTER — Encounter (HOSPITAL_COMMUNITY): Payer: Self-pay | Admitting: Radiology

## 2011-09-24 DIAGNOSIS — N390 Urinary tract infection, site not specified: Secondary | ICD-10-CM | POA: Diagnosis present

## 2011-09-24 DIAGNOSIS — B028 Zoster with other complications: Secondary | ICD-10-CM | POA: Diagnosis present

## 2011-09-24 DIAGNOSIS — I10 Essential (primary) hypertension: Secondary | ICD-10-CM

## 2011-09-24 LAB — BASIC METABOLIC PANEL
BUN: 17 mg/dL (ref 6–23)
CO2: 28 mEq/L (ref 19–32)
Calcium: 9.5 mg/dL (ref 8.4–10.5)
Creatinine, Ser: 0.93 mg/dL (ref 0.50–1.35)
Glucose, Bld: 108 mg/dL — ABNORMAL HIGH (ref 70–99)
Sodium: 133 mEq/L — ABNORMAL LOW (ref 135–145)

## 2011-09-24 LAB — CBC
MCH: 28.7 pg (ref 26.0–34.0)
MCV: 84.2 fL (ref 78.0–100.0)
Platelets: 86 10*3/uL — ABNORMAL LOW (ref 150–400)
RBC: 4.49 MIL/uL (ref 4.22–5.81)

## 2011-09-24 LAB — URINALYSIS, ROUTINE W REFLEX MICROSCOPIC
Glucose, UA: NEGATIVE mg/dL
Ketones, ur: 15 mg/dL — AB
Nitrite: POSITIVE — AB
Specific Gravity, Urine: 1.031 — ABNORMAL HIGH (ref 1.005–1.030)
pH: 5.5 (ref 5.0–8.0)

## 2011-09-24 LAB — URINE MICROSCOPIC-ADD ON

## 2011-09-24 MED ORDER — MAGIC MOUTHWASH W/LIDOCAINE
5.0000 mL | Freq: Four times a day (QID) | ORAL | Status: DC | PRN
  Filled 2011-09-24: qty 5

## 2011-09-24 MED ORDER — IOHEXOL 300 MG/ML  SOLN: 100.0000 mL | Freq: Once | INTRAMUSCULAR | Status: AC | PRN

## 2011-09-24 MED ORDER — ACETAMINOPHEN 650 MG RE SUPP: 650.0000 mg | Freq: Four times a day (QID) | RECTAL | Status: DC | PRN

## 2011-09-24 MED ORDER — ONDANSETRON HCL 4 MG PO TABS: 4.0000 mg | ORAL_TABLET | Freq: Four times a day (QID) | ORAL | Status: DC | PRN

## 2011-09-24 MED ORDER — OXYCODONE HCL 5 MG PO TABS: 5.0000 mg | ORAL_TABLET | ORAL | Status: DC | PRN

## 2011-09-24 MED ORDER — DEXTROSE 5 % IV SOLN
400.0000 mg | Freq: Three times a day (TID) | INTRAVENOUS | Status: DC
  Administered 2011-09-24 – 2011-09-27 (×10): 400 mg via INTRAVENOUS
  Filled 2011-09-24 (×16): qty 8

## 2011-09-24 MED ORDER — ZOLPIDEM TARTRATE 5 MG PO TABS
5.0000 mg | ORAL_TABLET | Freq: Every evening | ORAL | Status: DC | PRN
  Administered 2011-09-27: 5 mg via ORAL
  Filled 2011-09-24: qty 1

## 2011-09-24 MED ORDER — ONDANSETRON HCL 4 MG/2ML IJ SOLN: 4.0000 mg | Freq: Four times a day (QID) | INTRAMUSCULAR | Status: DC | PRN

## 2011-09-24 MED ORDER — ALUM & MAG HYDROXIDE-SIMETH 200-200-20 MG/5ML PO SUSP: 30.0000 mL | Freq: Four times a day (QID) | ORAL | Status: DC | PRN

## 2011-09-24 MED ORDER — HYDRALAZINE HCL 20 MG/ML IJ SOLN
10.0000 mg | Freq: Four times a day (QID) | INTRAMUSCULAR | Status: DC | PRN
  Filled 2011-09-24: qty 0.5

## 2011-09-24 MED ORDER — HYDRALAZINE HCL 20 MG/ML IJ SOLN
2.0000 mg | Freq: Four times a day (QID) | INTRAMUSCULAR | Status: DC | PRN
  Filled 2011-09-24: qty 0.1

## 2011-09-24 MED ORDER — SODIUM CHLORIDE 0.9 % IV SOLN
INTRAVENOUS | Status: DC
  Administered 2011-09-24: 04:00:00 via INTRAVENOUS

## 2011-09-24 MED ORDER — METRONIDAZOLE 500 MG PO TABS
500.0000 mg | ORAL_TABLET | Freq: Three times a day (TID) | ORAL | Status: DC
  Filled 2011-09-24 (×3): qty 1

## 2011-09-24 MED ORDER — DEXTROSE 5 % IV SOLN
1000.0000 mg | Freq: Once | INTRAVENOUS | Status: AC
  Administered 2011-09-24: 1000 mg via INTRAVENOUS
  Filled 2011-09-24: qty 20

## 2011-09-24 MED ORDER — AMLODIPINE BESYLATE 10 MG PO TABS
10.0000 mg | ORAL_TABLET | Freq: Every day | ORAL | Status: DC
  Administered 2011-09-24 – 2011-09-27 (×4): 10 mg via ORAL
  Filled 2011-09-24 (×5): qty 1

## 2011-09-24 MED ORDER — HYDROMORPHONE HCL PF 1 MG/ML IJ SOLN
0.5000 mg | INTRAMUSCULAR | Status: DC | PRN
  Administered 2011-09-24 – 2011-09-27 (×14): 1 mg via INTRAVENOUS
  Filled 2011-09-24 (×14): qty 1

## 2011-09-24 MED ORDER — ONDANSETRON HCL 4 MG/2ML IJ SOLN
4.0000 mg | Freq: Once | INTRAMUSCULAR | Status: AC
  Administered 2011-09-24: 4 mg via INTRAVENOUS
  Filled 2011-09-24: qty 2

## 2011-09-24 MED ORDER — SODIUM CHLORIDE 0.9 % IV SOLN
INTRAVENOUS | Status: DC
  Administered 2011-09-24: 01:00:00 via INTRAVENOUS

## 2011-09-24 MED ORDER — DEXTROSE 5 % IV SOLN
1.0000 g | INTRAVENOUS | Status: DC
  Administered 2011-09-24: 1 g via INTRAVENOUS
  Filled 2011-09-24: qty 10

## 2011-09-24 MED ORDER — ENOXAPARIN SODIUM 40 MG/0.4ML ~~LOC~~ SOLN
40.0000 mg | SUBCUTANEOUS | Status: DC
  Administered 2011-09-24 – 2011-09-26 (×3): 40 mg via SUBCUTANEOUS
  Filled 2011-09-24 (×4): qty 0.4

## 2011-09-24 MED ORDER — ACETAMINOPHEN 325 MG PO TABS
650.0000 mg | ORAL_TABLET | Freq: Four times a day (QID) | ORAL | Status: DC | PRN
  Administered 2011-09-24: 650 mg via ORAL
  Filled 2011-09-24: qty 2

## 2011-09-24 MED ORDER — LISINOPRIL 40 MG PO TABS
40.0000 mg | ORAL_TABLET | Freq: Every day | ORAL | Status: DC
  Administered 2011-09-24 – 2011-09-27 (×4): 40 mg via ORAL
  Filled 2011-09-24 (×5): qty 1

## 2011-09-24 MED ORDER — HYDROMORPHONE HCL PF 2 MG/ML IJ SOLN
2.0000 mg | Freq: Once | INTRAMUSCULAR | Status: AC
  Administered 2011-09-24: 2 mg via INTRAVENOUS
  Filled 2011-09-24: qty 1

## 2011-09-24 NOTE — H&P (Addendum)
DATE OF ADMISSION:  09/24/2011  PCP:    Lehman Prom, NP   Chief Complaint: Swelling of Face and left Ear   HPI: Leon Nichols is an 46 y.o. male who presents to the ED with complaints of worsening swelling and rash of his face and left ear x 5 days.  He reports the rash began around his left ear and started as painful blisters. He denies any fevers or chills the rash and swelling worsened with swelling of the left ear and left eye and the jaw.  He reports difficulty eating due to the pain.  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    Past Medical History  Diagnosis Date  . Hypertension     Past Surgical History  Procedure Date  . Colonoscopy 08/31/2011    Procedure: COLONOSCOPY;  Surgeon: Hilarie Fredrickson, MD;  Location: [facility] ENDOSCOPY;  Service: Endoscopy;  Laterality: N/A;    Medications:  HOME MEDS: Prior to Admission medications   Medication Sig Start Date End Date Taking? Authorizing Provider  amLODipine (NORVASC) 10 MG tablet Take 1 tablet (10 mg total) by mouth daily. 09/01/11 08/31/12 Yes Letha Cape, PA  ibuprofen (ADVIL,MOTRIN) 200 MG tablet Take 800-1,000 mg by mouth every 6 (six) hours as needed. For pain   Yes Historical Provider, MD  lisinopril (PRINIVIL,ZESTRIL) 40 MG tablet Take 1 tablet (40 mg total) by mouth daily. 09/01/11 08/31/12 Yes Letha Cape, PA  metroNIDAZOLE (FLAGYL) 500 MG tablet Take 500 mg by mouth 3 (three) times daily. 09/22/11 09/29/11 Yes Historical Provider, MD    Allergies:  No Known Allergies  Social History:   reports that he has been smoking Cigarettes.  He has a 3.75 pack-year smoking history. He does not have any smokeless tobacco history on file. He reports that he drinks alcohol. He reports that he uses illicit drugs (Marijuana and Cocaine).  Family History: Family History  Problem Relation Age of Onset  . Malignant hyperthermia Neg Hx      Review of Systems: anorexia, The patient denies  fever, weight loss, vision loss, decreased hearing, hoarseness, chest pain, syncope, dyspnea on exertion, peripheral edema, balance deficits, hemoptysis, abdominal pain, melena, hematochezia, severe indigestion/heartburn, hematuria, incontinence, genital sores, muscle weakness, suspicious skin lesions, transient blindness, difficulty walking, depression, unusual weight change, abnormal bleeding, enlarged lymph nodes, angioedema, and breast masses.   Physical Exam:  GEN:  Pleasant 47 year old Morbidly Obese African American Male examined and in no acute distress; cooperative with exam Filed Vitals:   09/23/11 1740 09/23/11 2237 09/24/11 0114 09/24/11 0200  BP: 149/98 152/105 131/88 112/85  Pulse: 106 104 92 88  Temp: 99 F (37.2 C) 100.4 F (38 C)    TempSrc: Oral Oral    Resp: 20 18 20 16   Height:   6' 0.83" (1.85 m)   Weight:   109 kg (240 lb 4.8 oz)   SpO2: 98% 98% 96% 93%   Blood pressure 112/85, pulse 88, temperature 100.4 F (38 C), temperature source Oral, resp. rate 16, height 6' 0.84" (1.85 m), weight 109 kg (240 lb 4.8 oz), SpO2 93.00%. PSYCH: He is alert and oriented x4; does not appear anxious does not appear depressed; affect is normal HEENT: Normocephalic and diffuse contiguous desquamating vesicular areas of left face from left Ear to Left Orbit, and left maxillary and auricularis area.   Left Orbit: Edematous and PERRLA; EOM intact; Fundi:  Benign;  No scleral icterus,  Left EAR:  Edematous Pinna and Tragus, and External Ear Canal.   Nares: Patent, Oropharynx: Clear, Fair Dentition, Neck:  FROM, no cervical lymphadenopathy nor thyromegaly or carotid bruit; no JVD; Breasts:: Not examined CHEST WALL: No tenderness CHEST: Normal respiration, clear to auscultation bilaterally HEART: Regular rate and rhythm; no murmurs rubs or gallops BACK: No kyphosis or scoliosis; no CVA tenderness ABDOMEN: Positive Bowel Sounds, Obese,  soft non-tender; no masses, no organomegaly.   Rectal Exam: Not done EXTREMITIES: No bone or joint deformity; age-appropriate arthropathy of the hands and knees; no cyanosis, clubbing or edema; no ulcerations. Genitalia: not examined PULSES: 2+ and symmetric SKIN: Normal hydration no rash or ulceration CNS: Cranial nerves 2-12 grossly intact no focal neurologic deficit   Labs & Imaging Results for orders placed during the hospital encounter of 09/23/11 (from the past 48 hour(s))  CBC     Status: Abnormal   Collection Time   09/23/11 10:07 PM      Component Value Range Comment   WBC 6.0  4.0 - 10.5 K/uL    RBC 4.90  4.22 - 5.81 MIL/uL    Hemoglobin 14.5  13.0 - 17.0 g/dL    HCT 91.4  78.2 - 95.6 %    MCV 83.5  78.0 - 100.0 fL    MCH 29.6  26.0 - 34.0 pg    MCHC 35.5  30.0 - 36.0 g/dL    RDW 21.3  08.6 - 57.8 %    Platelets 98 (*) 150 - 400 K/uL PLATELET COUNT CONFIRMED BY SMEAR  DIFFERENTIAL     Status: Normal   Collection Time   09/23/11 10:07 PM      Component Value Range Comment   Neutrophils Relative 73  43 - 77 %    Neutro Abs 4.3  1.7 - 7.7 K/uL    Lymphocytes Relative 16  12 - 46 %    Lymphs Abs 0.9  0.7 - 4.0 K/uL    Monocytes Relative 11  3 - 12 %    Monocytes Absolute 0.7  0.1 - 1.0 K/uL    Eosinophils Relative 0  0 - 5 %    Eosinophils Absolute 0.0  0.0 - 0.7 K/uL    Basophils Relative 1  0 - 1 %    Basophils Absolute 0.0  0.0 - 0.1 K/uL   COMPREHENSIVE METABOLIC PANEL     Status: Abnormal   Collection Time   09/23/11 10:07 PM      Component Value Range Comment   Sodium 135  135 - 145 mEq/L    Potassium 4.1  3.5 - 5.1 mEq/L    Chloride 93 (*) 96 - 112 mEq/L    CO2 26  19 - 32 mEq/L    Glucose, Bld 110 (*) 70 - 99 mg/dL    BUN 16  6 - 23 mg/dL    Creatinine, Ser 4.69  0.50 - 1.35 mg/dL    Calcium 9.9  8.4 - 62.9 mg/dL    Total Protein 8.9 (*) 6.0 - 8.3 g/dL    Albumin 3.7  3.5 - 5.2 g/dL    AST 14  0 - 37 U/L    ALT 8  0 - 53 U/L    Alkaline Phosphatase 57   39 - 117 U/L    Total Bilirubin 0.3  0.3 - 1.2 mg/dL    GFR calc non Af Amer >90  >90 mL/min    GFR calc Af Amer >  90  >90 mL/min   LACTIC ACID, PLASMA     Status: Normal   Collection Time   09/23/11 10:17 PM      Component Value Range Comment   Lactic Acid, Venous 1.8  0.5 - 2.2 mmol/L   PROCALCITONIN     Status: Normal   Collection Time   09/23/11 10:17 PM      Component Value Range Comment   Procalcitonin <0.10     URINALYSIS, ROUTINE W REFLEX MICROSCOPIC     Status: Abnormal   Collection Time   09/23/11 11:27 PM      Component Value Range Comment   Color, Urine AMBER (*) YELLOW BIOCHEMICALS MAY BE AFFECTED BY COLOR   APPearance CLOUDY (*) CLEAR    Specific Gravity, Urine 1.031 (*) 1.005 - 1.030    pH 5.5  5.0 - 8.0    Glucose, UA NEGATIVE  NEGATIVE mg/dL    Hgb urine dipstick SMALL (*) NEGATIVE    Bilirubin Urine SMALL (*) NEGATIVE    Ketones, ur 15 (*) NEGATIVE mg/dL    Protein, ur 161 (*) NEGATIVE mg/dL    Urobilinogen, UA 1.0  0.0 - 1.0 mg/dL    Nitrite POSITIVE (*) NEGATIVE    Leukocytes, UA SMALL (*) NEGATIVE   URINE MICROSCOPIC-ADD ON     Status: Abnormal   Collection Time   09/23/11 11:27 PM      Component Value Range Comment   Squamous Epithelial / LPF RARE  RARE    WBC, UA 0-2  <3 WBC/hpf    RBC / HPF 0-2  <3 RBC/hpf    Bacteria, UA MANY (*) RARE    Urine-Other MUCOUS PRESENT      Dg Chest 2 View  09/23/2011  *RADIOLOGY REPORT*  Clinical Data: Papulovesicular rash arising in the left ear. Question of Ramsay-Hunt syndrome.  CHEST - 2 VIEW  Comparison: None.  Findings: The lungs are well-aerated and clear.  There is no evidence of focal opacification, pleural effusion or pneumothorax. A left-sided skin fold is noted.  The heart is normal in size; the mediastinal contour is within normal limits.  No acute osseous abnormalities are seen.  IMPRESSION: No acute cardiopulmonary process seen.  Original Report Authenticated By: Tonia Ghent, M.D.   Ct Head W Wo  Contrast  09/24/2011  *RADIOLOGY REPORT*  Clinical Data:  Facial swelling and left-sided facial rash.  Cannot hear out of left ear.  CT HEAD WITH AND WITHOUT CONTRAST CT MAXILLOFACIAL WITH CONTRAST  Technique:  Multidetector CT imaging of the head was performed using the standard protocol, with and without IV contrast.  CT imaging of the maxillofacial structures was performed with intra venous contrast. Multiplanar CT image reconstructions were also generated.  Contrast:  100 mL of Omnipaque 300 IV contrast  Comparison:   None.  CT HEAD  Findings: There is no evidence of acute infarction, mass lesion, or intra- or extra-axial hemorrhage on CT.  No abnormal focal contrast enhancement is identified.  The visualized intracranial vasculature is unremarkable in appearance.  The posterior fossa, including the cerebellum, brainstem and fourth ventricle, is within normal limits.  The third and lateral ventricles, and basal ganglia are unremarkable in appearance.  The cerebral hemispheres are symmetric in appearance, with normal gray- white differentiation.  No mass effect or midline shift is seen.  There is no evidence of fracture; visualized osseous structures are unremarkable in appearance.  The visualized portions of the orbits are within normal limits.  The paranasal sinuses and  mastoid air cells are well-aerated.  Diffuse soft tissue swelling is noted on the left side of the head, surrounding the left ear, with edema along the left external auditory canal, and surrounding the left orbit.  Associated skin thickening is seen.  IMPRESSION:  1.  No acute intracranial pathology seen on CT. 2.  Diffuse soft tissue swelling on the left side of the head, surrounding the left ear, with edema along the left external auditory canal, and surrounding the left orbit.  Associated skin thickening seen.  CT MAXILLOFACIAL  Findings: There is no evidence of fracture or dislocation.  The maxilla and mandible appear intact.  The nasal  bone is unremarkable in appearance.  The visualized dentition demonstrates no acute abnormality.  The orbits are intact bilaterally.  The paranasal sinuses and mastoid air cells are well-aerated.  There is marked soft tissue swelling on the left side of the base, with associated skin thickening.  Soft tissue swelling surrounds the left orbit and involves the left ear, with diffuse soft tissue thickening along the left external auditory canal.  There is associated mild enlargement of the left parotid gland. There may be slight fat stranding within the left parapharyngeal fat planes.  There is no evidence of vascular compromise at this time.  Standard mildly enlarged left cervical nodes are seen, measuring up to 1.4 cm in short axis.  Right-sided nodes are slightly smaller, at the upper limits of normal. The palatine tonsils are somewhat prominent bilaterally, with mild narrowing of the oropharynx; this likely remains at the upper limits of normal.  The nasopharynx and hypopharynx are unremarkable in appearance. The visualized portions of the valleculae and piriform sinuses are grossly unremarkable.  The submandibular glands are within normal limits.  IMPRESSION:  1.  No evidence of fracture or dislocation. 2.  Marked soft tissue swelling on the left side of the face, with associated skin thickening.  Soft tissue swelling surrounds the left orbit and involves the left ear, with diffuse soft tissue thickening along the left external auditory canal. 3.  Mild left-sided cervical lymphadenopathy noted, with nodes measuring up to 1.4 cm in short axis.  Original Report Authenticated By: Tonia Ghent, M.D.   Ct Maxillofacial W/cm  09/24/2011  *RADIOLOGY REPORT*  Clinical Data:  Facial swelling and left-sided facial rash.  Cannot hear out of left ear.  CT HEAD WITH AND WITHOUT CONTRAST CT MAXILLOFACIAL WITH CONTRAST  Technique:  Multidetector CT imaging of the head was performed using the standard protocol, with and  without IV contrast.  CT imaging of the maxillofacial structures was performed with intra venous contrast. Multiplanar CT image reconstructions were also generated.  Contrast:  100 mL of Omnipaque 300 IV contrast  Comparison:   None.  CT HEAD  Findings: There is no evidence of acute infarction, mass lesion, or intra- or extra-axial hemorrhage on CT.  No abnormal focal contrast enhancement is identified.  The visualized intracranial vasculature is unremarkable in appearance.  The posterior fossa, including the cerebellum, brainstem and fourth ventricle, is within normal limits.  The third and lateral ventricles, and basal ganglia are unremarkable in appearance.  The cerebral hemispheres are symmetric in appearance, with normal gray- white differentiation.  No mass effect or midline shift is seen.  There is no evidence of fracture; visualized osseous structures are unremarkable in appearance.  The visualized portions of the orbits are within normal limits.  The paranasal sinuses and mastoid air cells are well-aerated.  Diffuse soft tissue swelling is noted on  the left side of the head, surrounding the left ear, with edema along the left external auditory canal, and surrounding the left orbit.  Associated skin thickening is seen.  IMPRESSION:  1.  No acute intracranial pathology seen on CT. 2.  Diffuse soft tissue swelling on the left side of the head, surrounding the left ear, with edema along the left external auditory canal, and surrounding the left orbit.  Associated skin thickening seen.  CT MAXILLOFACIAL  Findings: There is no evidence of fracture or dislocation.  The maxilla and mandible appear intact.  The nasal bone is unremarkable in appearance.  The visualized dentition demonstrates no acute abnormality.  The orbits are intact bilaterally.  The paranasal sinuses and mastoid air cells are well-aerated.  There is marked soft tissue swelling on the left side of the base, with associated skin thickening.  Soft  tissue swelling surrounds the left orbit and involves the left ear, with diffuse soft tissue thickening along the left external auditory canal.  There is associated mild enlargement of the left parotid gland. There may be slight fat stranding within the left parapharyngeal fat planes.  There is no evidence of vascular compromise at this time.  Standard mildly enlarged left cervical nodes are seen, measuring up to 1.4 cm in short axis.  Right-sided nodes are slightly smaller, at the upper limits of normal. The palatine tonsils are somewhat prominent bilaterally, with mild narrowing of the oropharynx; this likely remains at the upper limits of normal.  The nasopharynx and hypopharynx are unremarkable in appearance. The visualized portions of the valleculae and piriform sinuses are grossly unremarkable.  The submandibular glands are within normal limits.  IMPRESSION:  1.  No evidence of fracture or dislocation. 2.  Marked soft tissue swelling on the left side of the face, with associated skin thickening.  Soft tissue swelling surrounds the left orbit and involves the left ear, with diffuse soft tissue thickening along the left external auditory canal. 3.  Mild left-sided cervical lymphadenopathy noted, with nodes measuring up to 1.4 cm in short axis.  Original Report Authenticated By: Tonia Ghent, M.D.      Assessment: Present on Admission:  .Herpes zoster complicated .UTI (urinary tract infection) .Diverticulosis of colon (without mention of hemorrhage) .Colitis .HYPERTENSION, BENIGN ESSENTIAL  Morbid Obesity   Plan:       Admit to Med/Surg Bed IV Acyclovir, Droplet Precautions Urine C+S, IV Rocephin IVFs PRN Hydralazine for SBP > 160 Reconcile Home Meds DVT prophylaxis Other plans as per orders.    CODE STATUS:      FULL CODE         [place] Bryngelson C 09/24/2011, 2:44 AM

## 2011-09-24 NOTE — Care Management Note (Signed)
    Page 1 of 1   09/28/2011     8:31:43 AM   CARE MANAGEMENT NOTE 09/28/2011  Patient:  Leon Nichols, Leon Nichols   Account Number:  [id number]  Date Initiated:  09/24/2011  Documentation initiated by:  Letha Cape  Subjective/Objective Assessment:   dx herpes zoster complicated  admit- lives with parent- (father), pta independent.     Action/Plan:   Anticipated DC Date:  09/27/2011   Anticipated DC Plan:  HOME/SELF CARE      DC Planning Services  CM consult      Choice offered to / List presented to:             Status of service:  Completed, signed off Medicare Important Message given?   (If response is "NO", the following Medicare IM given date fields will be blank) Date Medicare IM given:   Date Additional Medicare IM given:    Discharge Disposition:  HOME/SELF CARE  Per UR Regulation:  Reviewed for med. necessity/level of care/duration of stay  If discussed at Long Length of Stay Meetings, dates discussed:    Comments:  09/27/11 12:08 Letha Cape RN, BSN [phone] patient is for possible dc today, Acyclovir is on the $4 list,  patient states he can afford this, patient wanted his appointment with Jovita Kussmaul changed from 7/1 to 7/8, I called to get that appt changed, noted under dc information.  09/24/11 13:21 Letha Cape RN, BSN [phone] patient lives with father, pta independent.  Patient is eligible for med ast if needed, patient has transportation at discharge.   Patient states he can afford his medications if they ar $4 on [facility] list.  NCM will continue to follow for dc needs. Per MD patient will need to be on Valtrex, and if sx's involve orbital area may need to have orbital surgery.  NCM will continue to follow for dc needs.

## 2011-09-24 NOTE — Progress Notes (Signed)
1400 Left eye swelling has decreased  And tongue with less swelling . Dr. Drue Second at bedside

## 2011-09-24 NOTE — Consult Note (Signed)
Infectious Diseases Initial Consultation  Reason for Consultation:  Zoster with ramsey-hunt syndrome   HPI: Leon Nichols is a 46 y.o. male HTN, perianal ulcers, and recent hospitalization for colitis, treated with cipro and metronidazole. He now presents with 5 day history of rash to left face that has progressed including causing left eye upperlid swelling, left ear swelling, and mild facial droop of left lower lip, which started 2 days prior to admit. His rash is comprised of numerous small vesicles that are spontanously wheeping mostly only found on left side of face including temple, check, chin. He did not seek care sooner since he thought it was an allergic reaction and thought it would go away. He states that there is some pain associated with the weeping rash. He doesn't have decreased hearing in his left ear, but feels it pop often. He denies any painful eye movements, photophobia, difficulty breathing, nor abdominal pain. Ras is no where else on his body.  He had chicken pox as a child. Never had shingles before.  Past Medical History  Diagnosis Date  . Hypertension     Allergies: No Known Allergies  Current antibiotics:  Acyclovir #1 MEDICATIONS:    . acyclovir  1,000 mg Intravenous Once  . acyclovir  400 mg Intravenous Q8H  . amLODipine  10 mg Oral Daily  . enoxaparin  40 mg Subcutaneous Q24H  . HYDROmorphone  2 mg Intravenous Once  . lisinopril  40 mg Oral Daily  . ondansetron (ZOFRAN) IV  4 mg Intravenous Once  . DISCONTD: cefTRIAXone (ROCEPHIN)  IV  1 g Intravenous Q24H  . DISCONTD: metroNIDAZOLE  500 mg Oral TID    History  Substance Use Topics  . Smoking status: Current Everyday Smoker -- 0.2 packs/day for 15 years    Types: Cigarettes  . Smokeless tobacco: Not on file  . Alcohol Use: Yes    Family History  Problem Relation Age of Onset  . Malignant hyperthermia Neg Hx     Review of Systems  Constitutional: Negative for fever, chills, diaphoresis,  activity change, appetite change, fatigue and unexpected weight change.  HENT: Negative for congestion, sore throat, rhinorrhea, sneezing, trouble swallowing and sinus pressure.  Eyes: Negative for photophobia and visual disturbance.  Respiratory: Negative for cough, chest tightness, shortness of breath, wheezing and stridor.  Cardiovascular: Negative for chest pain, palpitations and leg swelling.  Gastrointestinal: Negative for nausea, vomiting, abdominal pain, diarrhea, constipation, blood in stool, abdominal distention and anal bleeding.  Genitourinary: Negative for dysuria, hematuria, flank pain and difficulty urinating.  Musculoskeletal: Negative for myalgias, back pain, joint swelling, arthralgias and gait problem.  Skin: per hpi Neurological: Negative for dizziness, tremors, weakness and light-headedness.  Hematological: Negative for adenopathy. Does not bruise/bleed easily.  Psychiatric/Behavioral: Negative for behavioral problems, confusion, sleep disturbance, dysphoric mood, decreased concentration and agitation.    OBJECTIVE: Temp:  [98.5 F (36.9 C)-100.4 F (38 C)] 99.5 F (37.5 C) (06/21 1345) Pulse Rate:  [80-106] 90  (06/21 1345) Resp:  [16-20] 18  (06/21 1345) BP: (108-152)/(69-105) 118/77 mmHg (06/21 1345) SpO2:  [93 %-98 %] 97 % (06/21 1345) Weight:  [224 lb 3.3 oz (101.7 kg)-240 lb 4.8 oz (109 kg)] 224 lb 3.3 oz (101.7 kg) (06/21 0522)   General Appearance:    Alert, cooperative, no distress, appears stated age  Head:    Normocephalic, without obvious abnormality, atraumatic  Eyes:    PERRL, mildly injected left eye conjunctiva, EOM's intact,     Ears:  Left ear canal has lesions in EAC, markedly edematous ear.     Throat:   Upper and lower lip have dried crusted lesions, but lower lip is edematous with small vesicles; teeth and gums normal  Neck:   Supple, symmetrical, trachea midline, no adenopathy;         Back:     Symmetric, no curvature, ROM normal, no  CVA tenderness  Lungs:     Clear to auscultation bilaterally, respirations unlabored  Chest wall:    No tenderness or deformity  Heart:    Regular rate and rhythm, S1 and S2 normal, no murmur, rub   or gallop  Abdomen:     Soft, non-tender, bowel sounds active all four quadrants,    no masses, no organomegaly        Extremities:   Extremities normal, atraumatic, no cyanosis or edema  Pulses:   2+ and symmetric all extremities  Skin:   Numerous vesicles to temp, ear, cheek, and chin consistent with herpes zoster, some have cloudy fluid filled. Unclear if it appears to have secondary infection. At tip of chin, cluster of pinpoint vesicles that have white exudate.  Lymph nodes:   Cervical, supraclavicular, and axillary nodes normal  Neurologic:   He has slight left facial droop. Asymmetric smile, left side  Down. Can close eyes evenly.  Normal strength, sensation and reflexes      throughout   LABS: Results for orders placed during the hospital encounter of 09/23/11 (from the past 48 hour(s))  WOUND CULTURE     Status: Normal (Preliminary result)   Collection Time   09/23/11  9:52 PM      Component Value Range Comment   Specimen Description WOUND EAR      Special Requests NONE      Gram Stain        Value: NO WBC SEEN     RARE SQUAMOUS EPITHELIAL CELLS PRESENT     RARE GRAM POSITIVE RODS   Culture Culture reincubated for better growth      Report Status PENDING     CBC     Status: Abnormal   Collection Time   09/23/11 10:07 PM      Component Value Range Comment   WBC 6.0  4.0 - 10.5 K/uL    RBC 4.90  4.22 - 5.81 MIL/uL    Hemoglobin 14.5  13.0 - 17.0 g/dL    HCT 40.9  81.1 - 91.4 %    MCV 83.5  78.0 - 100.0 fL    MCH 29.6  26.0 - 34.0 pg    MCHC 35.5  30.0 - 36.0 g/dL    RDW 78.2  95.6 - 21.3 %    Platelets 98 (*) 150 - 400 K/uL PLATELET COUNT CONFIRMED BY SMEAR  DIFFERENTIAL     Status: Normal   Collection Time   09/23/11 10:07 PM      Component Value Range Comment    Neutrophils Relative 73  43 - 77 %    Neutro Abs 4.3  1.7 - 7.7 K/uL    Lymphocytes Relative 16  12 - 46 %    Lymphs Abs 0.9  0.7 - 4.0 K/uL    Monocytes Relative 11  3 - 12 %    Monocytes Absolute 0.7  0.1 - 1.0 K/uL    Eosinophils Relative 0  0 - 5 %    Eosinophils Absolute 0.0  0.0 - 0.7 K/uL    Basophils Relative 1  0 -  1 %    Basophils Absolute 0.0  0.0 - 0.1 K/uL   COMPREHENSIVE METABOLIC PANEL     Status: Abnormal   Collection Time   09/23/11 10:07 PM      Component Value Range Comment   Sodium 135  135 - 145 mEq/L    Potassium 4.1  3.5 - 5.1 mEq/L    Chloride 93 (*) 96 - 112 mEq/L    CO2 26  19 - 32 mEq/L    Glucose, Bld 110 (*) 70 - 99 mg/dL    BUN 16  6 - 23 mg/dL    Creatinine, Ser 1.61  0.50 - 1.35 mg/dL    Calcium 9.9  8.4 - 09.6 mg/dL    Total Protein 8.9 (*) 6.0 - 8.3 g/dL    Albumin 3.7  3.5 - 5.2 g/dL    AST 14  0 - 37 U/L    ALT 8  0 - 53 U/L    Alkaline Phosphatase 57  39 - 117 U/L    Total Bilirubin 0.3  0.3 - 1.2 mg/dL    GFR calc non Af Amer >90  >90 mL/min    GFR calc Af Amer >90  >90 mL/min   LACTIC ACID, PLASMA     Status: Normal   Collection Time   09/23/11 10:17 PM      Component Value Range Comment   Lactic Acid, Venous 1.8  0.5 - 2.2 mmol/L   PROCALCITONIN     Status: Normal   Collection Time   09/23/11 10:17 PM      Component Value Range Comment   Procalcitonin <0.10     URINALYSIS, ROUTINE W REFLEX MICROSCOPIC     Status: Abnormal   Collection Time   09/23/11 11:27 PM      Component Value Range Comment   Color, Urine AMBER (*) YELLOW BIOCHEMICALS MAY BE AFFECTED BY COLOR   APPearance CLOUDY (*) CLEAR    Specific Gravity, Urine 1.031 (*) 1.005 - 1.030    pH 5.5  5.0 - 8.0    Glucose, UA NEGATIVE  NEGATIVE mg/dL    Hgb urine dipstick SMALL (*) NEGATIVE    Bilirubin Urine SMALL (*) NEGATIVE    Ketones, ur 15 (*) NEGATIVE mg/dL    Protein, ur 045 (*) NEGATIVE mg/dL    Urobilinogen, UA 1.0  0.0 - 1.0 mg/dL    Nitrite POSITIVE (*) NEGATIVE      Leukocytes, UA SMALL (*) NEGATIVE   URINE MICROSCOPIC-ADD ON     Status: Abnormal   Collection Time   09/23/11 11:27 PM      Component Value Range Comment   Squamous Epithelial / LPF RARE  RARE    WBC, UA 0-2  <3 WBC/hpf    RBC / HPF 0-2  <3 RBC/hpf    Bacteria, UA MANY (*) RARE    Urine-Other MUCOUS PRESENT     BASIC METABOLIC PANEL     Status: Abnormal   Collection Time   09/24/11  5:25 AM      Component Value Range Comment   Sodium 133 (*) 135 - 145 mEq/L    Potassium 3.9  3.5 - 5.1 mEq/L    Chloride 94 (*) 96 - 112 mEq/L    CO2 28  19 - 32 mEq/L    Glucose, Bld 108 (*) 70 - 99 mg/dL    BUN 17  6 - 23 mg/dL    Creatinine, Ser 4.09  0.50 - 1.35 mg/dL  Calcium 9.5  8.4 - 10.5 mg/dL    GFR calc non Af Amer >90  >90 mL/min    GFR calc Af Amer >90  >90 mL/min   CBC     Status: Abnormal   Collection Time   09/24/11  5:25 AM      Component Value Range Comment   WBC 6.2  4.0 - 10.5 K/uL    RBC 4.49  4.22 - 5.81 MIL/uL    Hemoglobin 12.9 (*) 13.0 - 17.0 g/dL    HCT 40.9 (*) 81.1 - 52.0 %    MCV 84.2  78.0 - 100.0 fL    MCH 28.7  26.0 - 34.0 pg    MCHC 34.1  30.0 - 36.0 g/dL    RDW 91.4  78.2 - 95.6 %    Platelets 86 (*) 150 - 400 K/uL CONSISTENT WITH PREVIOUS RESULT    IMAGING: Dg Chest 2 View  09/23/2011  *RADIOLOGY REPORT*  Clinical Data: Papulovesicular rash arising in the left ear. Question of Ramsay-Hunt syndrome.  CHEST - 2 VIEW  Comparison: None.  Findings: The lungs are well-aerated and clear.  There is no evidence of focal opacification, pleural effusion or pneumothorax. A left-sided skin fold is noted.  The heart is normal in size; the mediastinal contour is within normal limits.  No acute osseous abnormalities are seen.  IMPRESSION: No acute cardiopulmonary process seen.  Original Report Authenticated By: Tonia Ghent, M.D.   Ct Head W Wo Contrast  09/24/2011  *RADIOLOGY REPORT*  Clinical Data:  Facial swelling and left-sided facial rash.  Cannot hear out of left  ear.  CT HEAD WITH AND WITHOUT CONTRAST CT MAXILLOFACIAL WITH CONTRAST  Technique:  Multidetector CT imaging of the head was performed using the standard protocol, with and without IV contrast.  CT imaging of the maxillofacial structures was performed with intra venous contrast. Multiplanar CT image reconstructions were also generated.  Contrast:  100 mL of Omnipaque 300 IV contrast  Comparison:   None.  CT HEAD  Findings: There is no evidence of acute infarction, mass lesion, or intra- or extra-axial hemorrhage on CT.  No abnormal focal contrast enhancement is identified.  The visualized intracranial vasculature is unremarkable in appearance.  The posterior fossa, including the cerebellum, brainstem and fourth ventricle, is within normal limits.  The third and lateral ventricles, and basal ganglia are unremarkable in appearance.  The cerebral hemispheres are symmetric in appearance, with normal gray- white differentiation.  No mass effect or midline shift is seen.  There is no evidence of fracture; visualized osseous structures are unremarkable in appearance.  The visualized portions of the orbits are within normal limits.  The paranasal sinuses and mastoid air cells are well-aerated.  Diffuse soft tissue swelling is noted on the left side of the head, surrounding the left ear, with edema along the left external auditory canal, and surrounding the left orbit.  Associated skin thickening is seen.  IMPRESSION:  1.  No acute intracranial pathology seen on CT. 2.  Diffuse soft tissue swelling on the left side of the head, surrounding the left ear, with edema along the left external auditory canal, and surrounding the left orbit.  Associated skin thickening seen.  CT MAXILLOFACIAL  Findings: There is no evidence of fracture or dislocation.  The maxilla and mandible appear intact.  The nasal bone is unremarkable in appearance.  The visualized dentition demonstrates no acute abnormality.  The orbits are intact  bilaterally.  The paranasal sinuses and mastoid  air cells are well-aerated.  There is marked soft tissue swelling on the left side of the base, with associated skin thickening.  Soft tissue swelling surrounds the left orbit and involves the left ear, with diffuse soft tissue thickening along the left external auditory canal.  There is associated mild enlargement of the left parotid gland. There may be slight fat stranding within the left parapharyngeal fat planes.  There is no evidence of vascular compromise at this time.  Standard mildly enlarged left cervical nodes are seen, measuring up to 1.4 cm in short axis.  Right-sided nodes are slightly smaller, at the upper limits of normal. The palatine tonsils are somewhat prominent bilaterally, with mild narrowing of the oropharynx; this likely remains at the upper limits of normal.  The nasopharynx and hypopharynx are unremarkable in appearance. The visualized portions of the valleculae and piriform sinuses are grossly unremarkable.  The submandibular glands are within normal limits.  IMPRESSION:  1.  No evidence of fracture or dislocation. 2.  Marked soft tissue swelling on the left side of the face, with associated skin thickening.  Soft tissue swelling surrounds the left orbit and involves the left ear, with diffuse soft tissue thickening along the left external auditory canal. 3.  Mild left-sided cervical lymphadenopathy noted, with nodes measuring up to 1.4 cm in short axis.  Original Report Authenticated By: Tonia Ghent, M.D.   Ct Maxillofacial W/cm  09/24/2011  *RADIOLOGY REPORT*  Clinical Data:  Facial swelling and left-sided facial rash.  Cannot hear out of left ear.  CT HEAD WITH AND WITHOUT CONTRAST CT MAXILLOFACIAL WITH CONTRAST  Technique:  Multidetector CT imaging of the head was performed using the standard protocol, with and without IV contrast.  CT imaging of the maxillofacial structures was performed with intra venous contrast. Multiplanar CT  image reconstructions were also generated.  Contrast:  100 mL of Omnipaque 300 IV contrast  Comparison:   None.  CT HEAD  Findings: There is no evidence of acute infarction, mass lesion, or intra- or extra-axial hemorrhage on CT.  No abnormal focal contrast enhancement is identified.  The visualized intracranial vasculature is unremarkable in appearance.  The posterior fossa, including the cerebellum, brainstem and fourth ventricle, is within normal limits.  The third and lateral ventricles, and basal ganglia are unremarkable in appearance.  The cerebral hemispheres are symmetric in appearance, with normal gray- white differentiation.  No mass effect or midline shift is seen.  There is no evidence of fracture; visualized osseous structures are unremarkable in appearance.  The visualized portions of the orbits are within normal limits.  The paranasal sinuses and mastoid air cells are well-aerated.  Diffuse soft tissue swelling is noted on the left side of the head, surrounding the left ear, with edema along the left external auditory canal, and surrounding the left orbit.  Associated skin thickening is seen.  IMPRESSION:  1.  No acute intracranial pathology seen on CT. 2.  Diffuse soft tissue swelling on the left side of the head, surrounding the left ear, with edema along the left external auditory canal, and surrounding the left orbit.  Associated skin thickening seen.  CT MAXILLOFACIAL  Findings: There is no evidence of fracture or dislocation.  The maxilla and mandible appear intact.  The nasal bone is unremarkable in appearance.  The visualized dentition demonstrates no acute abnormality.  The orbits are intact bilaterally.  The paranasal sinuses and mastoid air cells are well-aerated.  There is marked soft tissue swelling on the left  side of the base, with associated skin thickening.  Soft tissue swelling surrounds the left orbit and involves the left ear, with diffuse soft tissue thickening along the left  external auditory canal.  There is associated mild enlargement of the left parotid gland. There may be slight fat stranding within the left parapharyngeal fat planes.  There is no evidence of vascular compromise at this time.  Standard mildly enlarged left cervical nodes are seen, measuring up to 1.4 cm in short axis.  Right-sided nodes are slightly smaller, at the upper limits of normal. The palatine tonsils are somewhat prominent bilaterally, with mild narrowing of the oropharynx; this likely remains at the upper limits of normal.  The nasopharynx and hypopharynx are unremarkable in appearance. The visualized portions of the valleculae and piriform sinuses are grossly unremarkable.  The submandibular glands are within normal limits.  IMPRESSION:  1.  No evidence of fracture or dislocation. 2.  Marked soft tissue swelling on the left side of the face, with associated skin thickening.  Soft tissue swelling surrounds the left orbit and involves the left ear, with diffuse soft tissue thickening along the left external auditory canal. 3.  Mild left-sided cervical lymphadenopathy noted, with nodes measuring up to 1.4 cm in short axis.  Original Report Authenticated By: Tonia Ghent, M.D.    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 Maintain on airborne and contact isolation as long as vesicles present. Once crusted over, airborne iso can be discontinued. If showing improvement, then can switch to oral acyclovir 800mg  five times a day or valtrex 1gm TID. Treat with antivirals for a total of 7 days.  - will check HIV test  - patient appears to pick at his lesions, would watch for secondary bacterial infection, then would treat with either cephalexin.  - if patient complains of any eye pain, would recommend urgent ophtho eval.    Thank you for consultation.  If questions arise over the  weekend, please contact tim lane who is on call for ID.  Duke Salvia Drue Second MD MPH [facility] [phone]

## 2011-09-24 NOTE — ED Notes (Signed)
Report given to floor nurse. Pt transported to floor with Tech. Pt alert and oriented upon admission

## 2011-09-24 NOTE — Progress Notes (Signed)
PATIENT DETAILS Name: Leon Nichols Age: 46 y.o. Sex: male Date of Birth: 1966/03/10 Admit Date: 09/23/2011 [id number],MVHQIONG, NP  Subjective: Admitted with Left facial vesicular eruptions-no major issues overnight  Objective: Vital signs in last 24 hours: Filed Vitals:   09/24/11 0230 09/24/11 0300 09/24/11 0522 09/24/11 1345  BP: 119/79 108/69 129/91 118/77  Pulse: 97 94 80 90  Temp:   98.5 F (36.9 C) 99.5 F (37.5 C)  TempSrc:   Oral Oral  Resp:   16 18  Height:   6\' 1"  (1.854 m)   Weight:   101.7 kg (224 lb 3.3 oz)   SpO2: 98% 98% 95% 97%    Weight change:   Body mass index is 29.58 kg/(m^2).  Intake/Output from previous day:  Intake/Output Summary (Last 24 hours) at 09/24/11 1526 Last data filed at 09/24/11 0900  Gross per 24 hour  Intake 313.33 ml  Output      0 ml  Net 313.33 ml    PHYSICAL EXAM: Gen Exam: Awake and alert with clear speech.   Face-numerous vesicular lesions affecting the left face, the left chin, left lower lip and left auricle. No nose or left involvement yet Neck: Supple, No JVD.   Chest: B/L Clear.   CVS: S1 S2 Regular, no murmurs.  Abdomen: soft, BS +, non tender, non distended.  Extremities: no edema, lower extremities warm to touch. Neurologic: Non Focal.   Skin: No Rash.   Wounds: N/A.    CONSULTS:  ID  LAB RESULTS: CBC  Lab 09/24/11 0525 09/23/11 2207  WBC 6.2 6.0  HGB 12.9* 14.5  HCT 37.8* 40.9  PLT 86* 98*  MCV 84.2 83.5  MCH 28.7 29.6  MCHC 34.1 35.5  RDW 15.3 15.3  LYMPHSABS -- 0.9  MONOABS -- 0.7  EOSABS -- 0.0  BASOSABS -- 0.0  BANDABS -- --    Chemistries   Lab 09/24/11 0525 09/23/11 2207  NA 133* 135  K 3.9 4.1  CL 94* 93*  CO2 28 26  GLUCOSE 108* 110*  BUN 17 16  CREATININE 0.93 0.89  CALCIUM 9.5 9.9  MG -- --    CBG: No results found for this basename: GLUCAP:5 in the last 168 hours  GFR Estimated Creatinine Clearance: 128.3 ml/min (by C-G formula based on Cr of  0.93).  Coagulation profile No results found for this basename: INR:5,PROTIME:5 in the last 168 hours  Cardiac Enzymes No results found for this basename: CK:3,CKMB:3,TROPONINI:3,MYOGLOBIN:3 in the last 168 hours  No components found with this basename: POCBNP:3 No results found for this basename: DDIMER:2 in the last 72 hours No results found for this basename: HGBA1C:2 in the last 72 hours No results found for this basename: CHOL:2,HDL:2,LDLCALC:2,TRIG:2,CHOLHDL:2,LDLDIRECT:2 in the last 72 hours No results found for this basename: TSH,T4TOTAL,FREET3,T3FREE,THYROIDAB in the last 72 hours No results found for this basename: VITAMINB12:2,FOLATE:2,FERRITIN:2,TIBC:2,IRON:2,RETICCTPCT:2 in the last 72 hours No results found for this basename: LIPASE:2,AMYLASE:2 in the last 72 hours  Urine Studies No results found for this basename: UACOL:2,UAPR:2,USPG:2,UPH:2,UTP:2,UGL:2,UKET:2,UBIL:2,UHGB:2,UNIT:2,UROB:2,ULEU:2,UEPI:2,UWBC:2,URBC:2,UBAC:2,CAST:2,CRYS:2,UCOM:2,BILUA:2 in the last 72 hours  MICROBIOLOGY: Recent Results (from the past 240 hour(s))  WOUND CULTURE     Status: Normal (Preliminary result)   Collection Time   09/23/11  9:52 PM      Component Value Range Status Comment   Specimen Description WOUND EAR   Final    Special Requests NONE   Final    Gram Stain     Final    Value: NO WBC SEEN  RARE SQUAMOUS EPITHELIAL CELLS PRESENT     RARE GRAM POSITIVE RODS   Culture Culture reincubated for better growth   Final    Report Status PENDING   Incomplete     RADIOLOGY STUDIES/RESULTS: Dg Chest 2 View  09/23/2011  *RADIOLOGY REPORT*  Clinical Data: Papulovesicular rash arising in the left ear. Question of Ramsay-Hunt syndrome.  CHEST - 2 VIEW  Comparison: None.  Findings: The lungs are well-aerated and clear.  There is no evidence of focal opacification, pleural effusion or pneumothorax. A left-sided skin fold is noted.  The heart is normal in size; the mediastinal contour is  within normal limits.  No acute osseous abnormalities are seen.  IMPRESSION: No acute cardiopulmonary process seen.  Original Report Authenticated By: Tonia Ghent, M.D.   Ct Head W Wo Contrast  09/24/2011  *RADIOLOGY REPORT*  Clinical Data:  Facial swelling and left-sided facial rash.  Cannot hear out of left ear.  CT HEAD WITH AND WITHOUT CONTRAST CT MAXILLOFACIAL WITH CONTRAST  Technique:  Multidetector CT imaging of the head was performed using the standard protocol, with and without IV contrast.  CT imaging of the maxillofacial structures was performed with intra venous contrast. Multiplanar CT image reconstructions were also generated.  Contrast:  100 mL of Omnipaque 300 IV contrast  Comparison:   None.  CT HEAD  Findings: There is no evidence of acute infarction, mass lesion, or intra- or extra-axial hemorrhage on CT.  No abnormal focal contrast enhancement is identified.  The visualized intracranial vasculature is unremarkable in appearance.  The posterior fossa, including the cerebellum, brainstem and fourth ventricle, is within normal limits.  The third and lateral ventricles, and basal ganglia are unremarkable in appearance.  The cerebral hemispheres are symmetric in appearance, with normal gray- white differentiation.  No mass effect or midline shift is seen.  There is no evidence of fracture; visualized osseous structures are unremarkable in appearance.  The visualized portions of the orbits are within normal limits.  The paranasal sinuses and mastoid air cells are well-aerated.  Diffuse soft tissue swelling is noted on the left side of the head, surrounding the left ear, with edema along the left external auditory canal, and surrounding the left orbit.  Associated skin thickening is seen.  IMPRESSION:  1.  No acute intracranial pathology seen on CT. 2.  Diffuse soft tissue swelling on the left side of the head, surrounding the left ear, with edema along the left external auditory canal, and  surrounding the left orbit.  Associated skin thickening seen.  CT MAXILLOFACIAL  Findings: There is no evidence of fracture or dislocation.  The maxilla and mandible appear intact.  The nasal bone is unremarkable in appearance.  The visualized dentition demonstrates no acute abnormality.  The orbits are intact bilaterally.  The paranasal sinuses and mastoid air cells are well-aerated.  There is marked soft tissue swelling on the left side of the base, with associated skin thickening.  Soft tissue swelling surrounds the left orbit and involves the left ear, with diffuse soft tissue thickening along the left external auditory canal.  There is associated mild enlargement of the left parotid gland. There may be slight fat stranding within the left parapharyngeal fat planes.  There is no evidence of vascular compromise at this time.  Standard mildly enlarged left cervical nodes are seen, measuring up to 1.4 cm in short axis.  Right-sided nodes are slightly smaller, at the upper limits of normal. The palatine tonsils are somewhat prominent bilaterally,  with mild narrowing of the oropharynx; this likely remains at the upper limits of normal.  The nasopharynx and hypopharynx are unremarkable in appearance. The visualized portions of the valleculae and piriform sinuses are grossly unremarkable.  The submandibular glands are within normal limits.  IMPRESSION:  1.  No evidence of fracture or dislocation. 2.  Marked soft tissue swelling on the left side of the face, with associated skin thickening.  Soft tissue swelling surrounds the left orbit and involves the left ear, with diffuse soft tissue thickening along the left external auditory canal. 3.  Mild left-sided cervical lymphadenopathy noted, with nodes measuring up to 1.4 cm in short axis.  Original Report Authenticated By: Tonia Ghent, M.D.   Ct Abdomen Pelvis W Contrast  08/29/2011  *RADIOLOGY REPORT*  Clinical Data: Boils in the pelvic area.  CT ABDOMEN AND PELVIS  WITH CONTRAST  Technique:  Multidetector CT imaging of the abdomen and pelvis was performed following the standard protocol during bolus administration of intravenous contrast. Oral and rectal contrast was given for this examination.  Contrast: OMNIPAQUE IOHEXOL 300 MG/ML  SOLN  Comparison: None.  Findings: Lungs clear with exception of dependent atelectasis. There is no evidence for free intraperitoneal air.  The gallbladder has an unusual appearance and may be within the liver parenchyma or capsule.  The gallbladder is very small and the tip may be folded on itself.  Difficult to exclude a small cystic structure near the tip of the gallbladder.  There is no evidence for acute inflammation.  Main portal venous system appears to be patent.  Normal appearance of the pancreas, adrenal glands, kidneys and spleen.  There are multiple lymph nodes throughout the inguinal regions bilaterally. There are prominent retroperitoneal nodes in the pelvis.  Index node along the right pelvic sidewall measures 1.1 x 1.5 cm on sequence 2, image 79.  There are small scattered lymph nodes along the iliac chains bilaterally.  There is wall thickening of the lower left colon and sigmoid colon. There is also edema and stranding in the sigmoid mesocolon.  There are prominent lymph node adjacent to the inflamed colon on sequence 2, image 58.  Additional lymph nodes on image 52.  The rectal contrast stops at the level of this colonic wall thickening.  There is a cutaneous ulcer and fissure along the left side of the back that extends towards the coccyx bone.  There is a small amount of soft tissue just anterior to the coccyx bone which could be inflammatory.  There is no evidence for a drainable abscess collection.  There is no significant free fluid.  No gross abnormality to the prostate, seminal vessels or urinary bladder.  Degenerative changes at L5-S1.  IMPRESSION: Wall thickening of the sigmoid colon and lower descending colon.  There is adjacent inflammation and lymphadenopathy.  The findings could be related to acute diverticulitis or colitis.  However, the adjacent lymph nodes also raise concern for an underlying neoplasm. Recommend GI consultation.  Cutaneous ulceration and fissure that extends towards the coccyx. Small amount of soft tissue just anterior to the coccyx may be inflammatory.  There are no drainable abscess collections.  Unusual appearance of the gallbladder.  This could represent a gallbladder variant such as an intrahepatic gallbladder.  There is no evidence for acute inflammation.  This area may be further evaluated with ultrasound.  These results were called by telephone on 08/29/2011  at  12:38 p.m. to   [facility], who verbally acknowledged these results.  Original Report Authenticated By: Richarda Overlie, M.D.   Ct Maxillofacial W/cm  09/24/2011  *RADIOLOGY REPORT*  Clinical Data:  Facial swelling and left-sided facial rash.  Cannot hear out of left ear.  CT HEAD WITH AND WITHOUT CONTRAST CT MAXILLOFACIAL WITH CONTRAST  Technique:  Multidetector CT imaging of the head was performed using the standard protocol, with and without IV contrast.  CT imaging of the maxillofacial structures was performed with intra venous contrast. Multiplanar CT image reconstructions were also generated.  Contrast:  100 mL of Omnipaque 300 IV contrast  Comparison:   None.  CT HEAD  Findings: There is no evidence of acute infarction, mass lesion, or intra- or extra-axial hemorrhage on CT.  No abnormal focal contrast enhancement is identified.  The visualized intracranial vasculature is unremarkable in appearance.  The posterior fossa, including the cerebellum, brainstem and fourth ventricle, is within normal limits.  The third and lateral ventricles, and basal ganglia are unremarkable in appearance.  The cerebral hemispheres are symmetric in appearance, with normal gray- white differentiation.  No mass effect or midline shift is seen.  There  is no evidence of fracture; visualized osseous structures are unremarkable in appearance.  The visualized portions of the orbits are within normal limits.  The paranasal sinuses and mastoid air cells are well-aerated.  Diffuse soft tissue swelling is noted on the left side of the head, surrounding the left ear, with edema along the left external auditory canal, and surrounding the left orbit.  Associated skin thickening is seen.  IMPRESSION:  1.  No acute intracranial pathology seen on CT. 2.  Diffuse soft tissue swelling on the left side of the head, surrounding the left ear, with edema along the left external auditory canal, and surrounding the left orbit.  Associated skin thickening seen.  CT MAXILLOFACIAL  Findings: There is no evidence of fracture or dislocation.  The maxilla and mandible appear intact.  The nasal bone is unremarkable in appearance.  The visualized dentition demonstrates no acute abnormality.  The orbits are intact bilaterally.  The paranasal sinuses and mastoid air cells are well-aerated.  There is marked soft tissue swelling on the left side of the base, with associated skin thickening.  Soft tissue swelling surrounds the left orbit and involves the left ear, with diffuse soft tissue thickening along the left external auditory canal.  There is associated mild enlargement of the left parotid gland. There may be slight fat stranding within the left parapharyngeal fat planes.  There is no evidence of vascular compromise at this time.  Standard mildly enlarged left cervical nodes are seen, measuring up to 1.4 cm in short axis.  Right-sided nodes are slightly smaller, at the upper limits of normal. The palatine tonsils are somewhat prominent bilaterally, with mild narrowing of the oropharynx; this likely remains at the upper limits of normal.  The nasopharynx and hypopharynx are unremarkable in appearance. The visualized portions of the valleculae and piriform sinuses are grossly unremarkable.   The submandibular glands are within normal limits.  IMPRESSION:  1.  No evidence of fracture or dislocation. 2.  Marked soft tissue swelling on the left side of the face, with associated skin thickening.  Soft tissue swelling surrounds the left orbit and involves the left ear, with diffuse soft tissue thickening along the left external auditory canal. 3.  Mild left-sided cervical lymphadenopathy noted, with nodes measuring up to 1.4 cm in short axis.  Original Report Authenticated By: Tonia Ghent, M.D.    MEDICATIONS: Scheduled Meds:   .  acyclovir  1,000 mg Intravenous Once  . acyclovir  400 mg Intravenous Q8H  . amLODipine  10 mg Oral Daily  . enoxaparin  40 mg Subcutaneous Q24H  . HYDROmorphone  2 mg Intravenous Once  . lisinopril  40 mg Oral Daily  . ondansetron (ZOFRAN) IV  4 mg Intravenous Once  . DISCONTD: cefTRIAXone (ROCEPHIN)  IV  1 g Intravenous Q24H  . DISCONTD: metroNIDAZOLE  500 mg Oral TID   Continuous Infusions:   . sodium chloride 20 mL/hr (09/24/11 1153)  . DISCONTD: sodium chloride 160 mL/hr at 09/24/11 0103   PRN Meds:.acetaminophen, acetaminophen, alum & mag hydroxide-simeth, hydrALAZINE, HYDROmorphone (DILAUDID) injection, iohexol, magic mouthwash w/lidocaine, ondansetron (ZOFRAN) IV, ondansetron, oxyCODONE, zolpidem, DISCONTD: hydrALAZINE  Antibiotics: Anti-infectives     Start     Dose/Rate Route Frequency Ordered Stop   09/24/11 1000   metroNIDAZOLE (FLAGYL) tablet 500 mg  Status:  Discontinued        500 mg Oral 3 times daily 09/24/11 0641 09/24/11 0819   09/24/11 0800   acyclovir (ZOVIRAX) 400 mg in dextrose 5 % 100 mL IVPB        400 mg 108 mL/hr over 60 Minutes Intravenous Every 8 hours 09/24/11 0354     09/24/11 0230   cefTRIAXone (ROCEPHIN) 1 g in dextrose 5 % 50 mL IVPB  Status:  Discontinued        1 g 100 mL/hr over 30 Minutes Intravenous Every 24 hours 09/24/11 0219 09/24/11 0819   09/24/11 0130   acyclovir (ZOVIRAX) 1,000 mg in dextrose 5 %  150 mL IVPB        1,000 mg 170 mL/hr over 60 Minutes Intravenous  Once 09/24/11 0128 09/24/11 0305          Assessment/Plan: Principal Problem:  *Herpes zoster complicated-Rasmey Hunt Syndrome -Hutchinson sign negative -c/w IV Acyclovir -monitor closely to see if opthalmic involvement occurs-have seen him twice today-none so far -ID consult  Active Problems:  HYPERTENSION, BENIGN ESSENTIAL -controlled -c/w Amlodipine and Lisinopril   Colitis-recent -will monitor off antibioitcs  H/o chronic perianal ulcers -monitor off antibiotics -outpatient [facility] follow up   ?UTI (urinary tract infection) -stop Rocephin-do not think that he has a UTI  Disposition: -remain inpatient  DVT Prophylaxis: -prophylactic Lovenox  Code Status: Full Code  Jeoffrey Massed, MD  [facility] Pager [phone]  If 7PM-7AM, please contact night-coverage [email or web address] Password [email or web address] 09/24/2011, 3:26 PM   LOS: 1 day

## 2011-09-24 NOTE — ED Notes (Signed)
Admitting MD at bedside.

## 2011-09-24 NOTE — Progress Notes (Signed)
ANTIBIOTIC CONSULT NOTE - INITIAL  Pharmacy Consult for acyclovir Indication: Herpes zoster  No Known Allergies  Patient Measurements: Height: 6' 0.83" (185 cm) Weight: 240 lb 4.8 oz (109 kg) IBW/kg (Calculated) : 79.52   Vital Signs: Temp: 100.4 F (38 C) (06/20 2237) Temp src: Oral (06/20 2237) BP: 112/85 mmHg (06/21 0200) Pulse Rate: 88  (06/21 0200)  Labs:  Alvira Philips 09/23/11 2207  WBC 6.0  HGB 14.5  PLT 98*  LABCREA --  CREATININE 0.89   Estimated Creatinine Clearance: 138.2 ml/min (by C-G formula based on Cr of 0.89).  Microbiology: Recent Results (from the past 720 hour(s))  HERPES SIMPLEX VIRUS CULTURE     Status: Normal   Collection Time   08/29/11  7:39 AM      Component Value Range Status Comment   Specimen Description PENIS   Final    Special Requests NONE   Final    Culture No Herpes Simplex Virus detected.   Final    Report Status 09/01/2011 FINAL   Final     Medical History: Past Medical History  Diagnosis Date  . Hypertension     Assessment: 46yo male c/o rash/pain/swelling of ear to chin and hearing loss x2 days, to be admitted for Herpes zoster infection with possible Ramsay-Hunt syndrome, to begin IV acyclovir.  Plan:  Rec'd initial dose of acyclovir 1000mg  IV in ED; will continue with acyclovir 400mg  IV Q8H and monitor progression.  Colleen Can PharmD BCPS 09/24/2011,2:29 AM

## 2011-09-24 NOTE — ED Notes (Signed)
Pt given water per MD  

## 2011-09-25 DIAGNOSIS — B028 Zoster with other complications: Secondary | ICD-10-CM

## 2011-09-25 DIAGNOSIS — I10 Essential (primary) hypertension: Secondary | ICD-10-CM

## 2011-09-25 DIAGNOSIS — N390 Urinary tract infection, site not specified: Secondary | ICD-10-CM

## 2011-09-25 LAB — CBC
MCH: 28.4 pg (ref 26.0–34.0)
MCV: 84.3 fL (ref 78.0–100.0)
Platelets: 107 10*3/uL — ABNORMAL LOW (ref 150–400)
RBC: 4.33 MIL/uL (ref 4.22–5.81)

## 2011-09-25 LAB — URINE CULTURE: Colony Count: 30000

## 2011-09-25 LAB — BASIC METABOLIC PANEL
CO2: 29 mEq/L (ref 19–32)
Calcium: 9.5 mg/dL (ref 8.4–10.5)
Creatinine, Ser: 0.95 mg/dL (ref 0.50–1.35)
Glucose, Bld: 103 mg/dL — ABNORMAL HIGH (ref 70–99)

## 2011-09-25 NOTE — Progress Notes (Signed)
PATIENT DETAILS Name: Leon Nichols Age: 46 y.o. Sex: male Date of Birth: 1966/03/09 Admit Date: 09/23/2011 ZOX:[id number], NP  Subjective: Admitted with Left facial vesicular eruptions-no major issues overnight-except low grade fever Mild decrease in facial swelling  Objective: Vital signs in last 24 hours: Filed Vitals:   09/24/11 1345 09/24/11 2110 09/25/11 0514 09/25/11 1008  BP: 118/77 125/83 127/82 113/76  Pulse: 90 85 91 87  Temp: 99.5 F (37.5 C) 98.1 F (36.7 C) 99.4 F (37.4 C)   TempSrc: Oral Oral Oral   Resp: 18 20 20    Height:      Weight:      SpO2: 97% 97% 97%     Weight change:   Body mass index is 29.58 kg/(m^2).  Intake/Output from previous day:  Intake/Output Summary (Last 24 hours) at 09/25/11 1054 Last data filed at 09/25/11 0900  Gross per 24 hour  Intake    662 ml  Output      0 ml  Net    662 ml    PHYSICAL EXAM: Gen Exam: Awake and alert with clear speech.   Face-numerous vesicular lesions affecting the left face, the left chin, left lower lip and left auricle. No nose or left eye involvement !Some lesions now starting to crust Neck: Supple, No JVD.   Chest: B/L Clear.   CVS: S1 S2 Regular, no murmurs.  Abdomen: soft, BS +, non tender, non distended.  Extremities: no edema, lower extremities warm to touch. Neurologic: Non Focal.   Skin: No Rash.   Wounds: N/A.    CONSULTS:  ID  LAB RESULTS: CBC  Lab 09/25/11 0617 09/24/11 0525 09/23/11 2207  WBC 5.4 6.2 6.0  HGB 12.3* 12.9* 14.5  HCT 36.5* 37.8* 40.9  PLT 107* 86* 98*  MCV 84.3 84.2 83.5  MCH 28.4 28.7 29.6  MCHC 33.7 34.1 35.5  RDW 15.1 15.3 15.3  LYMPHSABS -- -- 0.9  MONOABS -- -- 0.7  EOSABS -- -- 0.0  BASOSABS -- -- 0.0  BANDABS -- -- --    Chemistries   Lab 09/25/11 0617 09/24/11 0525 09/23/11 2207  NA 132* 133* 135  K 3.8 3.9 4.1  CL 92* 94* 93*  CO2 29 28 26   GLUCOSE 103* 108* 110*  BUN 17 17 16   CREATININE 0.95 0.93 0.89  CALCIUM 9.5 9.5 9.9    MG -- -- --    CBG: No results found for this basename: GLUCAP:5 in the last 168 hours  GFR Estimated Creatinine Clearance: 125.6 ml/min (by C-G formula based on Cr of 0.95).  Coagulation profile No results found for this basename: INR:5,PROTIME:5 in the last 168 hours  Cardiac Enzymes No results found for this basename: CK:3,CKMB:3,TROPONINI:3,MYOGLOBIN:3 in the last 168 hours  No components found with this basename: POCBNP:3 No results found for this basename: DDIMER:2 in the last 72 hours No results found for this basename: HGBA1C:2 in the last 72 hours No results found for this basename: CHOL:2,HDL:2,LDLCALC:2,TRIG:2,CHOLHDL:2,LDLDIRECT:2 in the last 72 hours No results found for this basename: TSH,T4TOTAL,FREET3,T3FREE,THYROIDAB in the last 72 hours No results found for this basename: VITAMINB12:2,FOLATE:2,FERRITIN:2,TIBC:2,IRON:2,RETICCTPCT:2 in the last 72 hours No results found for this basename: LIPASE:2,AMYLASE:2 in the last 72 hours  Urine Studies No results found for this basename: UACOL:2,UAPR:2,USPG:2,UPH:2,UTP:2,UGL:2,UKET:2,UBIL:2,UHGB:2,UNIT:2,UROB:2,ULEU:2,UEPI:2,UWBC:2,URBC:2,UBAC:2,CAST:2,CRYS:2,UCOM:2,BILUA:2 in the last 72 hours  MICROBIOLOGY: Recent Results (from the past 240 hour(s))  WOUND CULTURE     Status: Normal (Preliminary result)   Collection Time   09/23/11  9:52 PM  Component Value Range Status Comment   Specimen Description WOUND EAR   Final    Special Requests NONE   Final    Gram Stain     Final    Value: NO WBC SEEN     RARE SQUAMOUS EPITHELIAL CELLS PRESENT     RARE GRAM POSITIVE RODS   Culture Culture reincubated for better growth   Final    Report Status PENDING   Incomplete   CULTURE, BLOOD (ROUTINE X 2)     Status: Normal (Preliminary result)   Collection Time   09/23/11 10:10 PM      Component Value Range Status Comment   Specimen Description BLOOD RIGHT ARM   Final    Special Requests BOTTLES DRAWN AEROBIC AND ANAEROBIC 5CC  EACH   Final    Culture  Setup Time [id number]   Final    Culture     Final    Value:        BLOOD CULTURE RECEIVED NO GROWTH TO DATE CULTURE WILL BE HELD FOR 5 DAYS BEFORE ISSUING A FINAL NEGATIVE REPORT   Report Status PENDING   Incomplete   CULTURE, BLOOD (ROUTINE X 2)     Status: Normal (Preliminary result)   Collection Time   09/23/11 10:15 PM      Component Value Range Status Comment   Specimen Description BLOOD LEFT ARM   Final    Special Requests     Final    Value: BOTTLES DRAWN AEROBIC AND ANAEROBIC 8CC AEROBIC 2CC ANAEROBIC   Culture  Setup Time [id number]   Final    Culture     Final    Value:        BLOOD CULTURE RECEIVED NO GROWTH TO DATE CULTURE WILL BE HELD FOR 5 DAYS BEFORE ISSUING A FINAL NEGATIVE REPORT   Report Status PENDING   Incomplete   URINE CULTURE     Status: Normal   Collection Time   09/23/11 11:27 PM      Component Value Range Status Comment   Specimen Description URINE, CLEAN CATCH   Final    Special Requests NONE   Final    Culture  Setup Time [id number]   Final    Colony Count 30,000 COLONIES/ML   Final    Culture     Final    Value: DIPHTHEROIDS(CORYNEBACTERIUM SPECIES)     Note: Standardized susceptibility testing for this organism is not available.   Report Status 09/25/2011 FINAL   Final     RADIOLOGY STUDIES/RESULTS: Dg Chest 2 View  09/23/2011  *RADIOLOGY REPORT*  Clinical Data: Papulovesicular rash arising in the left ear. Question of Ramsay-Hunt syndrome.  CHEST - 2 VIEW  Comparison: None.  Findings: The lungs are well-aerated and clear.  There is no evidence of focal opacification, pleural effusion or pneumothorax. A left-sided skin fold is noted.  The heart is normal in size; the mediastinal contour is within normal limits.  No acute osseous abnormalities are seen.  IMPRESSION: No acute cardiopulmonary process seen.  Original Report Authenticated By: Tonia Ghent, M.D.   Ct Head W Wo Contrast  09/24/2011  *RADIOLOGY REPORT*  Clinical  Data:  Facial swelling and left-sided facial rash.  Cannot hear out of left ear.  CT HEAD WITH AND WITHOUT CONTRAST CT MAXILLOFACIAL WITH CONTRAST  Technique:  Multidetector CT imaging of the head was performed using the standard protocol, with and without IV contrast.  CT imaging of the maxillofacial structures was performed with intra venous  contrast. Multiplanar CT image reconstructions were also generated.  Contrast:  100 mL of Omnipaque 300 IV contrast  Comparison:   None.  CT HEAD  Findings: There is no evidence of acute infarction, mass lesion, or intra- or extra-axial hemorrhage on CT.  No abnormal focal contrast enhancement is identified.  The visualized intracranial vasculature is unremarkable in appearance.  The posterior fossa, including the cerebellum, brainstem and fourth ventricle, is within normal limits.  The third and lateral ventricles, and basal ganglia are unremarkable in appearance.  The cerebral hemispheres are symmetric in appearance, with normal gray- white differentiation.  No mass effect or midline shift is seen.  There is no evidence of fracture; visualized osseous structures are unremarkable in appearance.  The visualized portions of the orbits are within normal limits.  The paranasal sinuses and mastoid air cells are well-aerated.  Diffuse soft tissue swelling is noted on the left side of the head, surrounding the left ear, with edema along the left external auditory canal, and surrounding the left orbit.  Associated skin thickening is seen.  IMPRESSION:  1.  No acute intracranial pathology seen on CT. 2.  Diffuse soft tissue swelling on the left side of the head, surrounding the left ear, with edema along the left external auditory canal, and surrounding the left orbit.  Associated skin thickening seen.  CT MAXILLOFACIAL  Findings: There is no evidence of fracture or dislocation.  The maxilla and mandible appear intact.  The nasal bone is unremarkable in appearance.  The visualized  dentition demonstrates no acute abnormality.  The orbits are intact bilaterally.  The paranasal sinuses and mastoid air cells are well-aerated.  There is marked soft tissue swelling on the left side of the base, with associated skin thickening.  Soft tissue swelling surrounds the left orbit and involves the left ear, with diffuse soft tissue thickening along the left external auditory canal.  There is associated mild enlargement of the left parotid gland. There may be slight fat stranding within the left parapharyngeal fat planes.  There is no evidence of vascular compromise at this time.  Standard mildly enlarged left cervical nodes are seen, measuring up to 1.4 cm in short axis.  Right-sided nodes are slightly smaller, at the upper limits of normal. The palatine tonsils are somewhat prominent bilaterally, with mild narrowing of the oropharynx; this likely remains at the upper limits of normal.  The nasopharynx and hypopharynx are unremarkable in appearance. The visualized portions of the valleculae and piriform sinuses are grossly unremarkable.  The submandibular glands are within normal limits.  IMPRESSION:  1.  No evidence of fracture or dislocation. 2.  Marked soft tissue swelling on the left side of the face, with associated skin thickening.  Soft tissue swelling surrounds the left orbit and involves the left ear, with diffuse soft tissue thickening along the left external auditory canal. 3.  Mild left-sided cervical lymphadenopathy noted, with nodes measuring up to 1.4 cm in short axis.  Original Report Authenticated By: Tonia Ghent, M.D.   Ct Abdomen Pelvis W Contrast  08/29/2011  *RADIOLOGY REPORT*  Clinical Data: Boils in the pelvic area.  CT ABDOMEN AND PELVIS WITH CONTRAST  Technique:  Multidetector CT imaging of the abdomen and pelvis was performed following the standard protocol during bolus administration of intravenous contrast. Oral and rectal contrast was given for this examination.  Contrast:  OMNIPAQUE IOHEXOL 300 MG/ML  SOLN  Comparison: None.  Findings: Lungs clear with exception of dependent atelectasis. There is no  evidence for free intraperitoneal air.  The gallbladder has an unusual appearance and may be within the liver parenchyma or capsule.  The gallbladder is very small and the tip may be folded on itself.  Difficult to exclude a small cystic structure near the tip of the gallbladder.  There is no evidence for acute inflammation.  Main portal venous system appears to be patent.  Normal appearance of the pancreas, adrenal glands, kidneys and spleen.  There are multiple lymph nodes throughout the inguinal regions bilaterally. There are prominent retroperitoneal nodes in the pelvis.  Index node along the right pelvic sidewall measures 1.1 x 1.5 cm on sequence 2, image 79.  There are small scattered lymph nodes along the iliac chains bilaterally.  There is wall thickening of the lower left colon and sigmoid colon. There is also edema and stranding in the sigmoid mesocolon.  There are prominent lymph node adjacent to the inflamed colon on sequence 2, image 58.  Additional lymph nodes on image 52.  The rectal contrast stops at the level of this colonic wall thickening.  There is a cutaneous ulcer and fissure along the left side of the back that extends towards the coccyx bone.  There is a small amount of soft tissue just anterior to the coccyx bone which could be inflammatory.  There is no evidence for a drainable abscess collection.  There is no significant free fluid.  No gross abnormality to the prostate, seminal vessels or urinary bladder.  Degenerative changes at L5-S1.  IMPRESSION: Wall thickening of the sigmoid colon and lower descending colon. There is adjacent inflammation and lymphadenopathy.  The findings could be related to acute diverticulitis or colitis.  However, the adjacent lymph nodes also raise concern for an underlying neoplasm. Recommend GI consultation.  Cutaneous  ulceration and fissure that extends towards the coccyx. Small amount of soft tissue just anterior to the coccyx may be inflammatory.  There are no drainable abscess collections.  Unusual appearance of the gallbladder.  This could represent a gallbladder variant such as an intrahepatic gallbladder.  There is no evidence for acute inflammation.  This area may be further evaluated with ultrasound.  These results were called by telephone on 08/29/2011  at  12:38 p.m. to   [facility], who verbally acknowledged these results.  Original Report Authenticated By: Richarda Overlie, M.D.   Ct Maxillofacial W/cm  09/24/2011  *RADIOLOGY REPORT*  Clinical Data:  Facial swelling and left-sided facial rash.  Cannot hear out of left ear.  CT HEAD WITH AND WITHOUT CONTRAST CT MAXILLOFACIAL WITH CONTRAST  Technique:  Multidetector CT imaging of the head was performed using the standard protocol, with and without IV contrast.  CT imaging of the maxillofacial structures was performed with intra venous contrast. Multiplanar CT image reconstructions were also generated.  Contrast:  100 mL of Omnipaque 300 IV contrast  Comparison:   None.  CT HEAD  Findings: There is no evidence of acute infarction, mass lesion, or intra- or extra-axial hemorrhage on CT.  No abnormal focal contrast enhancement is identified.  The visualized intracranial vasculature is unremarkable in appearance.  The posterior fossa, including the cerebellum, brainstem and fourth ventricle, is within normal limits.  The third and lateral ventricles, and basal ganglia are unremarkable in appearance.  The cerebral hemispheres are symmetric in appearance, with normal gray- white differentiation.  No mass effect or midline shift is seen.  There is no evidence of fracture; visualized osseous structures are unremarkable in appearance.  The visualized  portions of the orbits are within normal limits.  The paranasal sinuses and mastoid air cells are well-aerated.  Diffuse soft tissue  swelling is noted on the left side of the head, surrounding the left ear, with edema along the left external auditory canal, and surrounding the left orbit.  Associated skin thickening is seen.  IMPRESSION:  1.  No acute intracranial pathology seen on CT. 2.  Diffuse soft tissue swelling on the left side of the head, surrounding the left ear, with edema along the left external auditory canal, and surrounding the left orbit.  Associated skin thickening seen.  CT MAXILLOFACIAL  Findings: There is no evidence of fracture or dislocation.  The maxilla and mandible appear intact.  The nasal bone is unremarkable in appearance.  The visualized dentition demonstrates no acute abnormality.  The orbits are intact bilaterally.  The paranasal sinuses and mastoid air cells are well-aerated.  There is marked soft tissue swelling on the left side of the base, with associated skin thickening.  Soft tissue swelling surrounds the left orbit and involves the left ear, with diffuse soft tissue thickening along the left external auditory canal.  There is associated mild enlargement of the left parotid gland. There may be slight fat stranding within the left parapharyngeal fat planes.  There is no evidence of vascular compromise at this time.  Standard mildly enlarged left cervical nodes are seen, measuring up to 1.4 cm in short axis.  Right-sided nodes are slightly smaller, at the upper limits of normal. The palatine tonsils are somewhat prominent bilaterally, with mild narrowing of the oropharynx; this likely remains at the upper limits of normal.  The nasopharynx and hypopharynx are unremarkable in appearance. The visualized portions of the valleculae and piriform sinuses are grossly unremarkable.  The submandibular glands are within normal limits.  IMPRESSION:  1.  No evidence of fracture or dislocation. 2.  Marked soft tissue swelling on the left side of the face, with associated skin thickening.  Soft tissue swelling surrounds the  left orbit and involves the left ear, with diffuse soft tissue thickening along the left external auditory canal. 3.  Mild left-sided cervical lymphadenopathy noted, with nodes measuring up to 1.4 cm in short axis.  Original Report Authenticated By: Tonia Ghent, M.D.    MEDICATIONS: Scheduled Meds:    . acyclovir  400 mg Intravenous Q8H  . amLODipine  10 mg Oral Daily  . enoxaparin  40 mg Subcutaneous Q24H  . lisinopril  40 mg Oral Daily   Continuous Infusions:    . sodium chloride 20 mL/hr (09/24/11 1153)   PRN Meds:.acetaminophen, acetaminophen, alum & mag hydroxide-simeth, hydrALAZINE, HYDROmorphone (DILAUDID) injection, iohexol, magic mouthwash w/lidocaine, ondansetron (ZOFRAN) IV, ondansetron, oxyCODONE, zolpidem  Antibiotics: Anti-infectives     Start     Dose/Rate Route Frequency Ordered Stop   09/24/11 1000   metroNIDAZOLE (FLAGYL) tablet 500 mg  Status:  Discontinued        500 mg Oral 3 times daily 09/24/11 0641 09/24/11 0819   09/24/11 0800   acyclovir (ZOVIRAX) 400 mg in dextrose 5 % 100 mL IVPB        400 mg 108 mL/hr over 60 Minutes Intravenous Every 8 hours 09/24/11 0354     09/24/11 0230   cefTRIAXone (ROCEPHIN) 1 g in dextrose 5 % 50 mL IVPB  Status:  Discontinued        1 g 100 mL/hr over 30 Minutes Intravenous Every 24 hours 09/24/11 0219 09/24/11 0819   09/24/11  0130   acyclovir (ZOVIRAX) 1,000 mg in dextrose 5 % 150 mL IVPB        1,000 mg 170 mL/hr over 60 Minutes Intravenous  Once 09/24/11 0128 09/24/11 0305          Assessment/Plan: Principal Problem:  *Herpes zoster complicated-Rasmey Hunt Syndrome -Hutchinson sign negative, no eye involvement -c/w IV Acyclovir -monitor closely to see if opthalmic involvement occurs-have seen him twice today-none so far -appreciate ID consult  Active Problems:  HYPERTENSION, BENIGN ESSENTIAL -controlled -c/w Amlodipine and Lisinopril   Colitis-recent -will monitor off antibioitcs  H/o chronic  perianal ulcers -monitor off antibiotics -outpatient [facility] follow up   ?UTI (urinary tract infection) -stop Rocephin-do not think that he has a UTI- urine cultures-Diptheroids-contaiminant  Disposition: -remain inpatient  DVT Prophylaxis: -prophylactic Lovenox  Code Status: Full Code  Jeoffrey Massed, MD  [facility] Pager [phone]  If 7PM-7AM, please contact night-coverage [email or web address] Password [facility] 09/25/2011, 10:54 AM   LOS: 2 days

## 2011-09-26 DIAGNOSIS — N390 Urinary tract infection, site not specified: Secondary | ICD-10-CM

## 2011-09-26 DIAGNOSIS — I10 Essential (primary) hypertension: Secondary | ICD-10-CM

## 2011-09-26 DIAGNOSIS — B028 Zoster with other complications: Secondary | ICD-10-CM

## 2011-09-26 NOTE — Progress Notes (Signed)
PATIENT DETAILS Name: Leon Nichols Age: 46 y.o. Sex: male Date of Birth: [birth year] Admit Date: 09/23/2011 WUJ:WJXBJY,NWGNFAOZ, NP  Subjective: Some lesions now starting to crust, left facial swelling continues to decrease  Objective: Vital signs in last 24 hours: Filed Vitals:   09/25/11 1008 09/25/11 1420 09/25/11 2143 09/26/11 0554  BP: 113/76 114/76 106/70 95/62  Pulse: 87 83 81 77  Temp:  98.5 F (36.9 C) 98.5 F (36.9 C) 98.2 F (36.8 C)  TempSrc:  Oral Oral Oral  Resp:  18 17 17   Height:      Weight:      SpO2:  99% 96% 99%    Weight change:   Body mass index is 29.58 kg/(m^2).  Intake/Output from previous day:  Intake/Output Summary (Last 24 hours) at 09/26/11 0842 Last data filed at 09/25/11 1845  Gross per 24 hour  Intake   1669 ml  Output      0 ml  Net   1669 ml    PHYSICAL EXAM: Gen Exam: Awake and alert with clear speech.   Face-numerous vesicular lesions affecting the left face, the left chin, left lower lip and left auricle. No nose or left eye involvement !Some lesions now starting to crust Neck: Supple, No JVD.   Chest: B/L Clear.   CVS: S1 S2 Regular, no murmurs.  Abdomen: soft, BS +, non tender, non distended.  Extremities: no edema, lower extremities warm to touch. Neurologic: Non Focal.   Skin: No Rash.   Wounds: N/A.    CONSULTS:  ID  LAB RESULTS: CBC  Lab 09/25/11 0617 09/24/11 0525 09/23/11 2207  WBC 5.4 6.2 6.0  HGB 12.3* 12.9* 14.5  HCT 36.5* 37.8* 40.9  PLT 107* 86* 98*  MCV 84.3 84.2 83.5  MCH 28.4 28.7 29.6  MCHC 33.7 34.1 35.5  RDW 15.1 15.3 15.3  LYMPHSABS -- -- 0.9  MONOABS -- -- 0.7  EOSABS -- -- 0.0  BASOSABS -- -- 0.0  BANDABS -- -- --    Chemistries   Lab 09/25/11 0617 09/24/11 0525 09/23/11 2207  NA 132* 133* 135  K 3.8 3.9 4.1  CL 92* 94* 93*  CO2 29 28 26   GLUCOSE 103* 108* 110*  BUN 17 17 16   CREATININE 0.95 0.93 0.89  CALCIUM 9.5 9.5 9.9  MG -- -- --    CBG: No results found for this  basename: GLUCAP:5 in the last 168 hours  GFR Estimated Creatinine Clearance: 125.6 ml/min (by C-G formula based on Cr of 0.95).  Coagulation profile No results found for this basename: INR:5,PROTIME:5 in the last 168 hours  Cardiac Enzymes No results found for this basename: CK:3,CKMB:3,TROPONINI:3,MYOGLOBIN:3 in the last 168 hours  No components found with this basename: POCBNP:3 No results found for this basename: DDIMER:2 in the last 72 hours No results found for this basename: HGBA1C:2 in the last 72 hours No results found for this basename: CHOL:2,HDL:2,LDLCALC:2,TRIG:2,CHOLHDL:2,LDLDIRECT:2 in the last 72 hours No results found for this basename: TSH,T4TOTAL,FREET3,T3FREE,THYROIDAB in the last 72 hours No results found for this basename: VITAMINB12:2,FOLATE:2,FERRITIN:2,TIBC:2,IRON:2,RETICCTPCT:2 in the last 72 hours No results found for this basename: LIPASE:2,AMYLASE:2 in the last 72 hours  Urine Studies No results found for this basename: UACOL:2,UAPR:2,USPG:2,UPH:2,UTP:2,UGL:2,UKET:2,UBIL:2,UHGB:2,UNIT:2,UROB:2,ULEU:2,UEPI:2,UWBC:2,URBC:2,UBAC:2,CAST:2,CRYS:2,UCOM:2,BILUA:2 in the last 72 hours  MICROBIOLOGY: Recent Results (from the past 240 hour(s))  WOUND CULTURE     Status: Normal (Preliminary result)   Collection Time   09/23/11  9:52 PM      Component Value Range Status Comment  Specimen Description WOUND EAR   Final    Special Requests NONE   Final    Gram Stain     Final    Value: NO WBC SEEN     RARE SQUAMOUS EPITHELIAL CELLS PRESENT     RARE GRAM POSITIVE RODS   Culture     Final    Value: ABUNDANT STAPHYLOCOCCUS AUREUS     Note: RIFAMPIN AND GENTAMICIN SHOULD NOT BE USED AS SINGLE DRUGS FOR TREATMENT OF STAPH INFECTIONS.   Report Status PENDING   Incomplete   CULTURE, BLOOD (ROUTINE X 2)     Status: Normal (Preliminary result)   Collection Time   09/23/11 10:10 PM      Component Value Range Status Comment   Specimen Description BLOOD RIGHT ARM   Final      Special Requests BOTTLES DRAWN AEROBIC AND ANAEROBIC 5CC EACH   Final    Culture  Setup Time [id number]   Final    Culture     Final    Value:        BLOOD CULTURE RECEIVED NO GROWTH TO DATE CULTURE WILL BE HELD FOR 5 DAYS BEFORE ISSUING A FINAL NEGATIVE REPORT   Report Status PENDING   Incomplete   CULTURE, BLOOD (ROUTINE X 2)     Status: Normal (Preliminary result)   Collection Time   09/23/11 10:15 PM      Component Value Range Status Comment   Specimen Description BLOOD LEFT ARM   Final    Special Requests     Final    Value: BOTTLES DRAWN AEROBIC AND ANAEROBIC 8CC AEROBIC 2CC ANAEROBIC   Culture  Setup Time [id number]   Final    Culture     Final    Value:        BLOOD CULTURE RECEIVED NO GROWTH TO DATE CULTURE WILL BE HELD FOR 5 DAYS BEFORE ISSUING A FINAL NEGATIVE REPORT   Report Status PENDING   Incomplete   URINE CULTURE     Status: Normal   Collection Time   09/23/11 11:27 PM      Component Value Range Status Comment   Specimen Description URINE, CLEAN CATCH   Final    Special Requests NONE   Final    Culture  Setup Time [id number]   Final    Colony Count 30,000 COLONIES/ML   Final    Culture     Final    Value: DIPHTHEROIDS(CORYNEBACTERIUM SPECIES)     Note: Standardized susceptibility testing for this organism is not available.   Report Status 09/25/2011 FINAL   Final     RADIOLOGY STUDIES/RESULTS: Dg Chest 2 View  09/23/2011  *RADIOLOGY REPORT*  Clinical Data: Papulovesicular rash arising in the left ear. Question of Ramsay-Hunt syndrome.  CHEST - 2 VIEW  Comparison: None.  Findings: The lungs are well-aerated and clear.  There is no evidence of focal opacification, pleural effusion or pneumothorax. A left-sided skin fold is noted.  The heart is normal in size; the mediastinal contour is within normal limits.  No acute osseous abnormalities are seen.  IMPRESSION: No acute cardiopulmonary process seen.  Original Report Authenticated By: Tonia Ghent, M.D.   Ct  Head W Wo Contrast  09/24/2011  *RADIOLOGY REPORT*  Clinical Data:  Facial swelling and left-sided facial rash.  Cannot hear out of left ear.  CT HEAD WITH AND WITHOUT CONTRAST CT MAXILLOFACIAL WITH CONTRAST  Technique:  Multidetector CT imaging of the head was performed using the standard protocol,  with and without IV contrast.  CT imaging of the maxillofacial structures was performed with intra venous contrast. Multiplanar CT image reconstructions were also generated.  Contrast:  100 mL of Omnipaque 300 IV contrast  Comparison:   None.  CT HEAD  Findings: There is no evidence of acute infarction, mass lesion, or intra- or extra-axial hemorrhage on CT.  No abnormal focal contrast enhancement is identified.  The visualized intracranial vasculature is unremarkable in appearance.  The posterior fossa, including the cerebellum, brainstem and fourth ventricle, is within normal limits.  The third and lateral ventricles, and basal ganglia are unremarkable in appearance.  The cerebral hemispheres are symmetric in appearance, with normal gray- white differentiation.  No mass effect or midline shift is seen.  There is no evidence of fracture; visualized osseous structures are unremarkable in appearance.  The visualized portions of the orbits are within normal limits.  The paranasal sinuses and mastoid air cells are well-aerated.  Diffuse soft tissue swelling is noted on the left side of the head, surrounding the left ear, with edema along the left external auditory canal, and surrounding the left orbit.  Associated skin thickening is seen.  IMPRESSION:  1.  No acute intracranial pathology seen on CT. 2.  Diffuse soft tissue swelling on the left side of the head, surrounding the left ear, with edema along the left external auditory canal, and surrounding the left orbit.  Associated skin thickening seen.  CT MAXILLOFACIAL  Findings: There is no evidence of fracture or dislocation.  The maxilla and mandible appear intact.  The  nasal bone is unremarkable in appearance.  The visualized dentition demonstrates no acute abnormality.  The orbits are intact bilaterally.  The paranasal sinuses and mastoid air cells are well-aerated.  There is marked soft tissue swelling on the left side of the base, with associated skin thickening.  Soft tissue swelling surrounds the left orbit and involves the left ear, with diffuse soft tissue thickening along the left external auditory canal.  There is associated mild enlargement of the left parotid gland. There may be slight fat stranding within the left parapharyngeal fat planes.  There is no evidence of vascular compromise at this time.  Standard mildly enlarged left cervical nodes are seen, measuring up to 1.4 cm in short axis.  Right-sided nodes are slightly smaller, at the upper limits of normal. The palatine tonsils are somewhat prominent bilaterally, with mild narrowing of the oropharynx; this likely remains at the upper limits of normal.  The nasopharynx and hypopharynx are unremarkable in appearance. The visualized portions of the valleculae and piriform sinuses are grossly unremarkable.  The submandibular glands are within normal limits.  IMPRESSION:  1.  No evidence of fracture or dislocation. 2.  Marked soft tissue swelling on the left side of the face, with associated skin thickening.  Soft tissue swelling surrounds the left orbit and involves the left ear, with diffuse soft tissue thickening along the left external auditory canal. 3.  Mild left-sided cervical lymphadenopathy noted, with nodes measuring up to 1.4 cm in short axis.  Original Report Authenticated By: Tonia Ghent, M.D.   Ct Abdomen Pelvis W Contrast  08/29/2011  *RADIOLOGY REPORT*  Clinical Data: Boils in the pelvic area.  CT ABDOMEN AND PELVIS WITH CONTRAST  Technique:  Multidetector CT imaging of the abdomen and pelvis was performed following the standard protocol during bolus administration of intravenous contrast. Oral and  rectal contrast was given for this examination.  Contrast: OMNIPAQUE IOHEXOL 300 MG/ML  SOLN  Comparison: None.  Findings: Lungs clear with exception of dependent atelectasis. There is no evidence for free intraperitoneal air.  The gallbladder has an unusual appearance and may be within the liver parenchyma or capsule.  The gallbladder is very small and the tip may be folded on itself.  Difficult to exclude a small cystic structure near the tip of the gallbladder.  There is no evidence for acute inflammation.  Main portal venous system appears to be patent.  Normal appearance of the pancreas, adrenal glands, kidneys and spleen.  There are multiple lymph nodes throughout the inguinal regions bilaterally. There are prominent retroperitoneal nodes in the pelvis.  Index node along the right pelvic sidewall measures 1.1 x 1.5 cm on sequence 2, image 79.  There are small scattered lymph nodes along the iliac chains bilaterally.  There is wall thickening of the lower left colon and sigmoid colon. There is also edema and stranding in the sigmoid mesocolon.  There are prominent lymph node adjacent to the inflamed colon on sequence 2, image 58.  Additional lymph nodes on image 52.  The rectal contrast stops at the level of this colonic wall thickening.  There is a cutaneous ulcer and fissure along the left side of the back that extends towards the coccyx bone.  There is a small amount of soft tissue just anterior to the coccyx bone which could be inflammatory.  There is no evidence for a drainable abscess collection.  There is no significant free fluid.  No gross abnormality to the prostate, seminal vessels or urinary bladder.  Degenerative changes at L5-S1.  IMPRESSION: Wall thickening of the sigmoid colon and lower descending colon. There is adjacent inflammation and lymphadenopathy.  The findings could be related to acute diverticulitis or colitis.  However, the adjacent lymph nodes also raise concern for an  underlying neoplasm. Recommend GI consultation.  Cutaneous ulceration and fissure that extends towards the coccyx. Small amount of soft tissue just anterior to the coccyx may be inflammatory.  There are no drainable abscess collections.  Unusual appearance of the gallbladder.  This could represent a gallbladder variant such as an intrahepatic gallbladder.  There is no evidence for acute inflammation.  This area may be further evaluated with ultrasound.  These results were called by telephone on 08/29/2011  at  12:38 p.m. to   [facility], who verbally acknowledged these results.  Original Report Authenticated By: Richarda Overlie, M.D.   Ct Maxillofacial W/cm  09/24/2011  *RADIOLOGY REPORT*  Clinical Data:  Facial swelling and left-sided facial rash.  Cannot hear out of left ear.  CT HEAD WITH AND WITHOUT CONTRAST CT MAXILLOFACIAL WITH CONTRAST  Technique:  Multidetector CT imaging of the head was performed using the standard protocol, with and without IV contrast.  CT imaging of the maxillofacial structures was performed with intra venous contrast. Multiplanar CT image reconstructions were also generated.  Contrast:  100 mL of Omnipaque 300 IV contrast  Comparison:   None.  CT HEAD  Findings: There is no evidence of acute infarction, mass lesion, or intra- or extra-axial hemorrhage on CT.  No abnormal focal contrast enhancement is identified.  The visualized intracranial vasculature is unremarkable in appearance.  The posterior fossa, including the cerebellum, brainstem and fourth ventricle, is within normal limits.  The third and lateral ventricles, and basal ganglia are unremarkable in appearance.  The cerebral hemispheres are symmetric in appearance, with normal gray- white differentiation.  No mass effect or midline shift is seen.  There is no evidence of fracture; visualized osseous structures are unremarkable in appearance.  The visualized portions of the orbits are within normal limits.  The paranasal sinuses  and mastoid air cells are well-aerated.  Diffuse soft tissue swelling is noted on the left side of the head, surrounding the left ear, with edema along the left external auditory canal, and surrounding the left orbit.  Associated skin thickening is seen.  IMPRESSION:  1.  No acute intracranial pathology seen on CT. 2.  Diffuse soft tissue swelling on the left side of the head, surrounding the left ear, with edema along the left external auditory canal, and surrounding the left orbit.  Associated skin thickening seen.  CT MAXILLOFACIAL  Findings: There is no evidence of fracture or dislocation.  The maxilla and mandible appear intact.  The nasal bone is unremarkable in appearance.  The visualized dentition demonstrates no acute abnormality.  The orbits are intact bilaterally.  The paranasal sinuses and mastoid air cells are well-aerated.  There is marked soft tissue swelling on the left side of the base, with associated skin thickening.  Soft tissue swelling surrounds the left orbit and involves the left ear, with diffuse soft tissue thickening along the left external auditory canal.  There is associated mild enlargement of the left parotid gland. There may be slight fat stranding within the left parapharyngeal fat planes.  There is no evidence of vascular compromise at this time.  Standard mildly enlarged left cervical nodes are seen, measuring up to 1.4 cm in short axis.  Right-sided nodes are slightly smaller, at the upper limits of normal. The palatine tonsils are somewhat prominent bilaterally, with mild narrowing of the oropharynx; this likely remains at the upper limits of normal.  The nasopharynx and hypopharynx are unremarkable in appearance. The visualized portions of the valleculae and piriform sinuses are grossly unremarkable.  The submandibular glands are within normal limits.  IMPRESSION:  1.  No evidence of fracture or dislocation. 2.  Marked soft tissue swelling on the left side of the face, with  associated skin thickening.  Soft tissue swelling surrounds the left orbit and involves the left ear, with diffuse soft tissue thickening along the left external auditory canal. 3.  Mild left-sided cervical lymphadenopathy noted, with nodes measuring up to 1.4 cm in short axis.  Original Report Authenticated By: Tonia Ghent, M.D.    MEDICATIONS: Scheduled Meds:    . acyclovir  400 mg Intravenous Q8H  . amLODipine  10 mg Oral Daily  . enoxaparin  40 mg Subcutaneous Q24H  . lisinopril  40 mg Oral Daily   Continuous Infusions:    . sodium chloride 20 mL/hr (09/24/11 1153)   PRN Meds:.acetaminophen, acetaminophen, alum & mag hydroxide-simeth, hydrALAZINE, HYDROmorphone (DILAUDID) injection, magic mouthwash w/lidocaine, ondansetron (ZOFRAN) IV, ondansetron, oxyCODONE, zolpidem  Antibiotics: Anti-infectives     Start     Dose/Rate Route Frequency Ordered Stop   09/24/11 1000   metroNIDAZOLE (FLAGYL) tablet 500 mg  Status:  Discontinued        500 mg Oral 3 times daily 09/24/11 0641 09/24/11 0819   09/24/11 0800   acyclovir (ZOVIRAX) 400 mg in dextrose 5 % 100 mL IVPB        400 mg 108 mL/hr over 60 Minutes Intravenous Every 8 hours 09/24/11 0354     09/24/11 0230   cefTRIAXone (ROCEPHIN) 1 g in dextrose 5 % 50 mL IVPB  Status:  Discontinued        1 g 100  mL/hr over 30 Minutes Intravenous Every 24 hours 09/24/11 0219 09/24/11 0819   09/24/11 0130   acyclovir (ZOVIRAX) 1,000 mg in dextrose 5 % 150 mL IVPB        1,000 mg 170 mL/hr over 60 Minutes Intravenous  Once 09/24/11 0128 09/24/11 0305          Assessment/Plan: Principal Problem:  *Herpes zoster complicated-Rasmey Hunt Syndrome -Hutchinson sign negative, no eye involvement -c/w IV Acyclovir, lesions now starting to crust -monitor closely to see if opthalmic involvement occurs-have seen him twice today-none so far -appreciate ID consult  Active Problems:  HYPERTENSION, BENIGN ESSENTIAL -controlled -c/w Amlodipine  and Lisinopril   Colitis-recent -will monitor off antibioitcs  H/o chronic perianal ulcers -monitor off antibiotics -outpatient [facility] follow up   ?UTI (urinary tract infection) -stop Rocephin-do not think that he has a UTI- urine cultures-Diptheroids-contaiminant  Disposition: -remain inpatient-possible d/c home in next 24 hours  DVT Prophylaxis: -prophylactic Lovenox  Code Status: Full Code  Jeoffrey Massed, MD  [facility] Pager [phone]  If 7PM-7AM, please contact night-coverage [email or web address] Password [facility] 09/26/2011, 8:42 AM   LOS: 3 days

## 2011-09-27 DIAGNOSIS — B028 Zoster with other complications: Secondary | ICD-10-CM

## 2011-09-27 DIAGNOSIS — N390 Urinary tract infection, site not specified: Secondary | ICD-10-CM

## 2011-09-27 DIAGNOSIS — I10 Essential (primary) hypertension: Secondary | ICD-10-CM

## 2011-09-27 LAB — WOUND CULTURE

## 2011-09-27 MED ORDER — DOXYCYCLINE HYCLATE 50 MG PO CAPS: 100.0000 mg | ORAL_CAPSULE | Freq: Two times a day (BID) | ORAL | Status: AC

## 2011-09-27 MED ORDER — ACYCLOVIR 400 MG PO TABS: 800.0000 mg | ORAL_TABLET | Freq: Every day | ORAL | Status: AC

## 2011-09-27 MED ORDER — DOXYCYCLINE HYCLATE 50 MG PO CAPS: 100.0000 mg | ORAL_CAPSULE | Freq: Two times a day (BID) | ORAL | Status: DC

## 2011-09-27 NOTE — Progress Notes (Signed)
Discharge instructions reviewed with patient verbalize and understand, prescriptions given to patient. Skin with facial rash left side of face dry and crusty.

## 2011-09-27 NOTE — Discharge Summary (Addendum)
PATIENT DETAILS Name: Leon Nichols Age: 46 y.o. Sex: male Date of Birth: 1965/04/29 MRN: [id number]. Admit Date: 09/23/2011 Admitting Physician: Ron Parker, MD WUJ:[id number],NWGNFAOZ, NP  PRIMARY DISCHARGE DIAGNOSIS:  Principal Problem:  *Herpes zoster complicated Active Problems:  HYPERTENSION, BENIGN ESSENTIAL  Colitis  Diverticulosis of colon (without mention of hemorrhage)     PAST MEDICAL HISTORY: Past Medical History  Diagnosis Date  . Hypertension     DISCHARGE MEDICATIONS: Medication List  As of 09/27/2011  3:19 PM   STOP taking these medications         metroNIDAZOLE 500 MG tablet         TAKE these medications         acyclovir 400 MG tablet   Commonly known as: ZOVIRAX   Take 2 tablets (800 mg total) by mouth 5 (five) times daily.      amLODipine 10 MG tablet   Commonly known as: NORVASC   Take 1 tablet (10 mg total) by mouth daily.      doxycycline 50 MG capsule   Commonly known as: VIBRAMYCIN   Take 2 capsules (100 mg total) by mouth 2 (two) times daily.      ibuprofen 200 MG tablet   Commonly known as: ADVIL,MOTRIN   Take 800-1,000 mg by mouth every 6 (six) hours as needed. For pain      lisinopril 40 MG tablet   Commonly known as: PRINIVIL,ZESTRIL   Take 1 tablet (40 mg total) by mouth daily.             BRIEF HPI:  See H&P, Labs, Consult and Test reports for all details in brief, Leon Nichols is a 46 y.o. male HTN, perianal ulcers, and recent hospitalization for colitis, treated with cipro and metronidazole. He now presents with 5 day history of rash to left face that has progressed including causing left eye upperlid swelling, left ear swelling, and mild facial droop of left lower lip, which started 2 days prior to admit. His rash is comprised of numerous small vesicles that are spontanously wheeping mostly only found on left side of face including temple, check, chin.  CONSULTATIONS:   ID  PERTINENT RADIOLOGIC STUDIES: Dg  Chest 2 View  09/23/2011  *RADIOLOGY REPORT*  Clinical Data: Papulovesicular rash arising in the left ear. Question of Ramsay-Hunt syndrome.  CHEST - 2 VIEW  Comparison: None.  Findings: The lungs are well-aerated and clear.  There is no evidence of focal opacification, pleural effusion or pneumothorax. A left-sided skin fold is noted.  The heart is normal in size; the mediastinal contour is within normal limits.  No acute osseous abnormalities are seen.  IMPRESSION: No acute cardiopulmonary process seen.  Original Report Authenticated By: Tonia Ghent, M.D.   Ct Head W Wo Contrast  09/24/2011  *RADIOLOGY REPORT*  Clinical Data:  Facial swelling and left-sided facial rash.  Cannot hear out of left ear.  CT HEAD WITH AND WITHOUT CONTRAST CT MAXILLOFACIAL WITH CONTRAST  Technique:  Multidetector CT imaging of the head was performed using the standard protocol, with and without IV contrast.  CT imaging of the maxillofacial structures was performed with intra venous contrast. Multiplanar CT image reconstructions were also generated.  Contrast:  100 mL of Omnipaque 300 IV contrast  Comparison:   None.  CT HEAD  Findings: There is no evidence of acute infarction, mass lesion, or intra- or extra-axial hemorrhage on CT.  No abnormal focal contrast enhancement is identified.  The visualized intracranial  vasculature is unremarkable in appearance.  The posterior fossa, including the cerebellum, brainstem and fourth ventricle, is within normal limits.  The third and lateral ventricles, and basal ganglia are unremarkable in appearance.  The cerebral hemispheres are symmetric in appearance, with normal gray- white differentiation.  No mass effect or midline shift is seen.  There is no evidence of fracture; visualized osseous structures are unremarkable in appearance.  The visualized portions of the orbits are within normal limits.  The paranasal sinuses and mastoid air cells are well-aerated.  Diffuse soft tissue swelling is  noted on the left side of the head, surrounding the left ear, with edema along the left external auditory canal, and surrounding the left orbit.  Associated skin thickening is seen.  IMPRESSION:  1.  No acute intracranial pathology seen on CT. 2.  Diffuse soft tissue swelling on the left side of the head, surrounding the left ear, with edema along the left external auditory canal, and surrounding the left orbit.  Associated skin thickening seen.  CT MAXILLOFACIAL  Findings: There is no evidence of fracture or dislocation.  The maxilla and mandible appear intact.  The nasal bone is unremarkable in appearance.  The visualized dentition demonstrates no acute abnormality.  The orbits are intact bilaterally.  The paranasal sinuses and mastoid air cells are well-aerated.  There is marked soft tissue swelling on the left side of the base, with associated skin thickening.  Soft tissue swelling surrounds the left orbit and involves the left ear, with diffuse soft tissue thickening along the left external auditory canal.  There is associated mild enlargement of the left parotid gland. There may be slight fat stranding within the left parapharyngeal fat planes.  There is no evidence of vascular compromise at this time.  Standard mildly enlarged left cervical nodes are seen, measuring up to 1.4 cm in short axis.  Right-sided nodes are slightly smaller, at the upper limits of normal. The palatine tonsils are somewhat prominent bilaterally, with mild narrowing of the oropharynx; this likely remains at the upper limits of normal.  The nasopharynx and hypopharynx are unremarkable in appearance. The visualized portions of the valleculae and piriform sinuses are grossly unremarkable.  The submandibular glands are within normal limits.  IMPRESSION:  1.  No evidence of fracture or dislocation. 2.  Marked soft tissue swelling on the left side of the face, with associated skin thickening.  Soft tissue swelling surrounds the left orbit  and involves the left ear, with diffuse soft tissue thickening along the left external auditory canal. 3.  Mild left-sided cervical lymphadenopathy noted, with nodes measuring up to 1.4 cm in short axis.  Original Report Authenticated By: Tonia Ghent, M.D.   Ct Abdomen Pelvis W Contrast  08/29/2011  *RADIOLOGY REPORT*  Clinical Data: Boils in the pelvic area.  CT ABDOMEN AND PELVIS WITH CONTRAST  Technique:  Multidetector CT imaging of the abdomen and pelvis was performed following the standard protocol during bolus administration of intravenous contrast. Oral and rectal contrast was given for this examination.  Contrast: OMNIPAQUE IOHEXOL 300 MG/ML  SOLN  Comparison: None.  Findings: Lungs clear with exception of dependent atelectasis. There is no evidence for free intraperitoneal air.  The gallbladder has an unusual appearance and may be within the liver parenchyma or capsule.  The gallbladder is very small and the tip may be folded on itself.  Difficult to exclude a small cystic structure near the tip of the gallbladder.  There is no evidence for acute  inflammation.  Main portal venous system appears to be patent.  Normal appearance of the pancreas, adrenal glands, kidneys and spleen.  There are multiple lymph nodes throughout the inguinal regions bilaterally. There are prominent retroperitoneal nodes in the pelvis.  Index node along the right pelvic sidewall measures 1.1 x 1.5 cm on sequence 2, image 79.  There are small scattered lymph nodes along the iliac chains bilaterally.  There is wall thickening of the lower left colon and sigmoid colon. There is also edema and stranding in the sigmoid mesocolon.  There are prominent lymph node adjacent to the inflamed colon on sequence 2, image 58.  Additional lymph nodes on image 52.  The rectal contrast stops at the level of this colonic wall thickening.  There is a cutaneous ulcer and fissure along the left side of the back that extends towards the coccyx  bone.  There is a small amount of soft tissue just anterior to the coccyx bone which could be inflammatory.  There is no evidence for a drainable abscess collection.  There is no significant free fluid.  No gross abnormality to the prostate, seminal vessels or urinary bladder.  Degenerative changes at L5-S1.  IMPRESSION: Wall thickening of the sigmoid colon and lower descending colon. There is adjacent inflammation and lymphadenopathy.  The findings could be related to acute diverticulitis or colitis.  However, the adjacent lymph nodes also raise concern for an underlying neoplasm. Recommend GI consultation.  Cutaneous ulceration and fissure that extends towards the coccyx. Small amount of soft tissue just anterior to the coccyx may be inflammatory.  There are no drainable abscess collections.  Unusual appearance of the gallbladder.  This could represent a gallbladder variant such as an intrahepatic gallbladder.  There is no evidence for acute inflammation.  This area may be further evaluated with ultrasound.  These results were called by telephone on 08/29/2011  at  12:38 p.m. to   [facility], who verbally acknowledged these results.  Original Report Authenticated By: Richarda Overlie, M.D.   Ct Maxillofacial W/cm  09/24/2011  *RADIOLOGY REPORT*  Clinical Data:  Facial swelling and left-sided facial rash.  Cannot hear out of left ear.  CT HEAD WITH AND WITHOUT CONTRAST CT MAXILLOFACIAL WITH CONTRAST  Technique:  Multidetector CT imaging of the head was performed using the standard protocol, with and without IV contrast.  CT imaging of the maxillofacial structures was performed with intra venous contrast. Multiplanar CT image reconstructions were also generated.  Contrast:  100 mL of Omnipaque 300 IV contrast  Comparison:   None.  CT HEAD  Findings: There is no evidence of acute infarction, mass lesion, or intra- or extra-axial hemorrhage on CT.  No abnormal focal contrast enhancement is identified.  The visualized  intracranial vasculature is unremarkable in appearance.  The posterior fossa, including the cerebellum, brainstem and fourth ventricle, is within normal limits.  The third and lateral ventricles, and basal ganglia are unremarkable in appearance.  The cerebral hemispheres are symmetric in appearance, with normal gray- white differentiation.  No mass effect or midline shift is seen.  There is no evidence of fracture; visualized osseous structures are unremarkable in appearance.  The visualized portions of the orbits are within normal limits.  The paranasal sinuses and mastoid air cells are well-aerated.  Diffuse soft tissue swelling is noted on the left side of the head, surrounding the left ear, with edema along the left external auditory canal, and surrounding the left orbit.  Associated skin thickening is seen.  IMPRESSION:  1.  No acute intracranial pathology seen on CT. 2.  Diffuse soft tissue swelling on the left side of the head, surrounding the left ear, with edema along the left external auditory canal, and surrounding the left orbit.  Associated skin thickening seen.  CT MAXILLOFACIAL  Findings: There is no evidence of fracture or dislocation.  The maxilla and mandible appear intact.  The nasal bone is unremarkable in appearance.  The visualized dentition demonstrates no acute abnormality.  The orbits are intact bilaterally.  The paranasal sinuses and mastoid air cells are well-aerated.  There is marked soft tissue swelling on the left side of the base, with associated skin thickening.  Soft tissue swelling surrounds the left orbit and involves the left ear, with diffuse soft tissue thickening along the left external auditory canal.  There is associated mild enlargement of the left parotid gland. There may be slight fat stranding within the left parapharyngeal fat planes.  There is no evidence of vascular compromise at this time.  Standard mildly enlarged left cervical nodes are seen, measuring up to 1.4 cm  in short axis.  Right-sided nodes are slightly smaller, at the upper limits of normal. The palatine tonsils are somewhat prominent bilaterally, with mild narrowing of the oropharynx; this likely remains at the upper limits of normal.  The nasopharynx and hypopharynx are unremarkable in appearance. The visualized portions of the valleculae and piriform sinuses are grossly unremarkable.  The submandibular glands are within normal limits.  IMPRESSION:  1.  No evidence of fracture or dislocation. 2.  Marked soft tissue swelling on the left side of the face, with associated skin thickening.  Soft tissue swelling surrounds the left orbit and involves the left ear, with diffuse soft tissue thickening along the left external auditory canal. 3.  Mild left-sided cervical lymphadenopathy noted, with nodes measuring up to 1.4 cm in short axis.  Original Report Authenticated By: Tonia Ghent, M.D.     PERTINENT LAB RESULTS: CBC:  Basename 09/25/11 0617  WBC 5.4  HGB 12.3*  HCT 36.5*  PLT 107*   CMET CMP     Component Value Date/Time   NA 132* 09/25/2011 0617   K 3.8 09/25/2011 0617   CL 92* 09/25/2011 0617   CO2 29 09/25/2011 0617   GLUCOSE 103* 09/25/2011 0617   BUN 17 09/25/2011 0617   CREATININE 0.95 09/25/2011 0617   CALCIUM 9.5 09/25/2011 0617   PROT 8.9* 09/23/2011 2207   ALBUMIN 3.7 09/23/2011 2207   AST 14 09/23/2011 2207   ALT 8 09/23/2011 2207   ALKPHOS 57 09/23/2011 2207   BILITOT 0.3 09/23/2011 2207   GFRNONAA >90 09/25/2011 0617   GFRAA >90 09/25/2011 0617    GFR Estimated Creatinine Clearance: 125.6 ml/min (by C-G formula based on Cr of 0.95). No results found for this basename: LIPASE:2,AMYLASE:2 in the last 72 hours No results found for this basename: CKTOTAL:3,CKMB:3,CKMBINDEX:3,TROPONINI:3 in the last 72 hours No components found with this basename: POCBNP:3 No results found for this basename: DDIMER:2 in the last 72 hours No results found for this basename: HGBA1C:2 in the last 72  hours No results found for this basename: CHOL:2,HDL:2,LDLCALC:2,TRIG:2,CHOLHDL:2,LDLDIRECT:2 in the last 72 hours No results found for this basename: TSH,T4TOTAL,FREET3,T3FREE,THYROIDAB in the last 72 hours No results found for this basename: VITAMINB12:2,FOLATE:2,FERRITIN:2,TIBC:2,IRON:2,RETICCTPCT:2 in the last 72 hours Coags: No results found for this basename: PT:2,INR:2 in the last 72 hours Microbiology: Recent Results (from the past 240 hour(s))  WOUND CULTURE  Status: Normal   Collection Time   09/23/11  9:52 PM      Component Value Range Status Comment   Specimen Description WOUND EAR   Final    Special Requests NONE   Final    Gram Stain     Final    Value: NO WBC SEEN     RARE SQUAMOUS EPITHELIAL CELLS PRESENT     RARE GRAM POSITIVE RODS   Culture     Final    Value: ABUNDANT STAPHYLOCOCCUS AUREUS     Note: RIFAMPIN AND GENTAMICIN SHOULD NOT BE USED AS SINGLE DRUGS FOR TREATMENT OF STAPH INFECTIONS.     ABUNDANT STREPTOCOCCUS GROUP G   Report Status 09/27/2011 FINAL   Final    Organism ID, Bacteria STAPHYLOCOCCUS AUREUS   Final   CULTURE, BLOOD (ROUTINE X 2)     Status: Normal (Preliminary result)   Collection Time   09/23/11 10:10 PM      Component Value Range Status Comment   Specimen Description BLOOD RIGHT ARM   Final    Special Requests BOTTLES DRAWN AEROBIC AND ANAEROBIC 5CC EACH   Final    Culture  Setup Time [id number]   Final    Culture     Final    Value:        BLOOD CULTURE RECEIVED NO GROWTH TO DATE CULTURE WILL BE HELD FOR 5 DAYS BEFORE ISSUING A FINAL NEGATIVE REPORT   Report Status PENDING   Incomplete   CULTURE, BLOOD (ROUTINE X 2)     Status: Normal (Preliminary result)   Collection Time   09/23/11 10:15 PM      Component Value Range Status Comment   Specimen Description BLOOD LEFT ARM   Final    Special Requests     Final    Value: BOTTLES DRAWN AEROBIC AND ANAEROBIC 8CC AEROBIC 2CC ANAEROBIC   Culture  Setup Time [id number]   Final     Culture     Final    Value:        BLOOD CULTURE RECEIVED NO GROWTH TO DATE CULTURE WILL BE HELD FOR 5 DAYS BEFORE ISSUING A FINAL NEGATIVE REPORT   Report Status PENDING   Incomplete   URINE CULTURE     Status: Normal   Collection Time   09/23/11 11:27 PM      Component Value Range Status Comment   Specimen Description URINE, CLEAN CATCH   Final    Special Requests NONE   Final    Culture  Setup Time [id number]   Final    Colony Count 30,000 COLONIES/ML   Final    Culture     Final    Value: DIPHTHEROIDS(CORYNEBACTERIUM SPECIES)     Note: Standardized susceptibility testing for this organism is not available.   Report Status 09/25/2011 FINAL   Final      BRIEF HOSPITAL COURSE:   Principal Problem: Herpes zoster complicated-Rasmey Hunt Syndrome  -admitted with obvious zoster lesions to the left side of the face, left face was swollen as well. On exam-Hutchinson sign was negative and there was no eye involvement  -Patient was started on IV Acyclovir,he was monitored very closely for any eye involvement, fortunately there was none. With treatment, patient's left facial swelling also has significantly resolved. Almost all of his vesicular lesions have now crusted. Patient will be transitioned to oral Acyclovir on discharge to complete a 7 day course. Ear swab done on 6/20 shows MSSA-will treat with Doxycycline.  HYPERTENSION,  BENIGN ESSENTIAL  -controlled during this admit -c/w Amlodipine and Lisinopril on discharge  Colitis-recent  -will monitor off antibioitcs   H/o chronic perianal ulcers  -monitor off antibiotics  -outpatient [facility] follow up   TODAY-DAY OF DISCHARGE:  Subjective:   Leon Nichols today has no headache,no chest abdominal pain,no new weakness tingling or numbness, feels much better wants to go home today. Almost all of the vesicular lesions on the left face have now crusted.  Objective:   Blood pressure 129/78, pulse 85, temperature 98.2 F (36.8 C),  temperature source Oral, resp. rate 20, height 6\' 1"  (1.854 m), weight 101.7 kg (224 lb 3.3 oz), SpO2 99.00%.  Intake/Output Summary (Last 24 hours) at 09/27/11 1519 Last data filed at 09/27/11 1300  Gross per 24 hour  Intake 722.67 ml  Output      0 ml  Net 722.67 ml    Exam Awake Alert, Oriented *3, No new F.N deficits, Normal affect [place].AT,PERRAL Supple Neck,No JVD, No cervical lymphadenopathy appriciated.  Symmetrical Chest wall movement, Good air movement bilaterally, CTAB RRR,No Gallops,Rubs or new Murmurs, No Parasternal Heave +ve B.Sounds, Abd Soft, Non tender, No organomegaly appriciated, No rebound -guarding or rigidity. No Cyanosis, Clubbing or edema, No new Rash or bruise  DISPOSITION: Home  DISCHARGE INSTRUCTIONS:    Follow-up Information    Follow up with [facility], MD on 10/11/2011. (11 am  initial visit is $50)    Contact information:   [address] [place] [address] [phone]         Total Time spent on discharge equals 45 minutes.  SignedJeoffrey Massed 09/27/2011 3:19 PM

## 2011-09-30 LAB — CULTURE, BLOOD (ROUTINE X 2)
Culture  Setup Time: 201306210356
Culture: NO GROWTH

## 2011-10-11 ENCOUNTER — Ambulatory Visit: Payer: Self-pay | Admitting: Internal Medicine

## 2011-12-30 ENCOUNTER — Emergency Department (HOSPITAL_COMMUNITY)
Admission: EM | Admit: 2011-12-30 | Discharge: 2011-12-30 | Disposition: A | Discharge status: Home / Self Care | Payer: Self-pay | Attending: Emergency Medicine | Admitting: Emergency Medicine

## 2011-12-30 DIAGNOSIS — T148XXA Other injury of unspecified body region, initial encounter: Secondary | ICD-10-CM | POA: Insufficient documentation

## 2011-12-30 DIAGNOSIS — I10 Essential (primary) hypertension: Secondary | ICD-10-CM | POA: Insufficient documentation

## 2011-12-30 DIAGNOSIS — X58XXXA Exposure to other specified factors, initial encounter: Secondary | ICD-10-CM | POA: Insufficient documentation

## 2011-12-30 DIAGNOSIS — L089 Local infection of the skin and subcutaneous tissue, unspecified: Secondary | ICD-10-CM | POA: Insufficient documentation

## 2011-12-30 DIAGNOSIS — F17200 Nicotine dependence, unspecified, uncomplicated: Secondary | ICD-10-CM | POA: Insufficient documentation

## 2011-12-30 MED ORDER — ACYCLOVIR 200 MG PO CAPS: 400.0000 mg | ORAL_CAPSULE | Freq: Every day | ORAL | Status: AC

## 2011-12-30 MED ORDER — ACYCLOVIR 200 MG PO CAPS
400.0000 mg | ORAL_CAPSULE | Freq: Once | ORAL | Status: AC
  Administered 2011-12-30: 400 mg via ORAL
  Filled 2011-12-30 (×2): qty 2

## 2011-12-30 MED ORDER — CEPHALEXIN 500 MG PO CAPS
500.0000 mg | ORAL_CAPSULE | Freq: Once | ORAL | Status: AC
  Administered 2011-12-30: 500 mg via ORAL
  Filled 2011-12-30: qty 1

## 2011-12-30 MED ORDER — MUPIROCIN 2 % EX OINT: TOPICAL_OINTMENT | Freq: Three times a day (TID) | CUTANEOUS | Status: DC

## 2011-12-30 MED ORDER — CEPHALEXIN 500 MG PO CAPS: 500.0000 mg | ORAL_CAPSULE | Freq: Three times a day (TID) | ORAL | Status: DC

## 2011-12-30 NOTE — ED Notes (Signed)
Pt reports recurrent boil bumps on his groin started about 3 month ago. Pt states he uses salt pork to open it.

## 2011-12-30 NOTE — ED Provider Notes (Signed)
History     CSN: [id number]  Arrival date & time 12/30/11  1420   First MD Initiated Contact with Patient 12/30/11 1503      Chief Complaint  Patient presents with  . Recurrent Skin Infections    (Consider location/radiation/quality/duration/timing/severity/associated sxs/prior treatment) HPI Comments: Patient states she's had an area in his that started suprapubically about 3 months ago as a "abscess" he placed fat back on the area and it drained, but has not healed since that time.  He has had multiple punctate lesions that are itchy and painful and he has scratched him to the point where they're open and oozing.  He is been applying cornstarch and Vaseline without any relief.  Denies any fever or dysuria, frequency, testicular pain.  The history is provided by the patient.    Past Medical History  Diagnosis Date  . Hypertension   . Diverticulosis   . Hemorrhoids   . Perirectal abscess   . Colitis     Past Surgical History  Procedure Date  . Colonoscopy 08/31/2011    Procedure: COLONOSCOPY;  Surgeon: Hilarie Fredrickson, MD;  Location: [facility] ENDOSCOPY;  Service: Endoscopy;  Laterality: N/A;    Family History  Problem Relation Age of Onset  . Hypertension Mother   . Diabetes Mother     History  Substance Use Topics  . Smoking status: Current Every Day Smoker -- 0.2 packs/day for 15 years    Types: Cigarettes  . Smokeless tobacco: Not on file  . Alcohol Use: Yes      Review of Systems  Constitutional: Negative for fever and chills.  Gastrointestinal: Negative for nausea.  Musculoskeletal: Negative for myalgias.  Skin: Positive for wound.  Neurological: Negative for weakness.    Allergies  Review of patient's allergies indicates no known allergies.  Home Medications   Current Outpatient Rx  Name Route Sig Dispense Refill  . IBUPROFEN 200 MG PO TABS Oral Take 800 mg by mouth every 8 (eight) hours as needed. For pain    . LISINOPRIL 40 MG PO TABS Oral Take 1  tablet (40 mg total) by mouth daily. 30 tablet 1  . ACYCLOVIR 200 MG PO CAPS Oral Take 2 capsules (400 mg total) by mouth 5 (five) times daily. 50 capsule 0  . CEPHALEXIN 500 MG PO CAPS Oral Take 1 capsule (500 mg total) by mouth 3 (three) times daily. 30 capsule 0  . MUPIROCIN 2 % EX OINT Topical Apply topically 3 (three) times daily. 22 g 0    BP 132/100  Pulse 100  Temp 98.3 F (36.8 C) (Oral)  Resp 18  SpO2 100%  Physical Exam  Constitutional: He appears well-developed and well-nourished.  HENT:  Head: Normocephalic.  Eyes: Pupils are equal, round, and reactive to light.  Neck: Normal range of motion.  Cardiovascular: Normal rate.   Pulmonary/Chest: Effort normal.  Abdominal: Soft. Bowel sounds are normal. He exhibits no distension.  Genitourinary: Penis normal.    Right testis shows no swelling and no tenderness. Left testis shows no swelling and no tenderness.  Musculoskeletal: Normal range of motion.  Lymphadenopathy:       Right: Inguinal adenopathy present.  Neurological: He is alert.  Skin: Skin is warm. Rash noted.    ED Course  Procedures (including critical care time)  Labs Reviewed - No data to display No results found.   1. Local infection of skin and subcutaneous tissue       MDM  Posterior this patient on  antiviral as well as antibiotic, as this appears to be herpetic in nature, and distribution, and there may be a little bit of superimposed infection in the open areas.  L.  Also refer this patient to ID for further evaluation         Arman Filter, NP 12/30/11 1658  Arman Filter, NP 12/30/11 1658

## 2011-12-31 NOTE — ED Provider Notes (Signed)
Medical screening examination/treatment/procedure(s) were conducted as a shared visit with non-physician practitioner(s) and myself.  I personally evaluated the patient during the encounter  Pt with evidence of ulcerated lesions. Consider nonhealing folliculitis vs herpetic infection. Will cover with abx and antivirals. ID for further evaluation. Topical abx recommended  Lyanne Co, MD 12/31/11 [phone]

## 2012-03-22 ENCOUNTER — Encounter (HOSPITAL_COMMUNITY): Payer: Self-pay

## 2012-03-22 ENCOUNTER — Inpatient Hospital Stay (HOSPITAL_COMMUNITY)
Admission: EM | Admit: 2012-03-22 | Discharge: 2012-04-03 | DRG: 969 | Disposition: A | Discharge status: Home / Self Care | Payer: Medicaid Other | Attending: Internal Medicine | Admitting: Internal Medicine

## 2012-03-22 DIAGNOSIS — K649 Unspecified hemorrhoids: Secondary | ICD-10-CM | POA: Diagnosis present

## 2012-03-22 DIAGNOSIS — I959 Hypotension, unspecified: Secondary | ICD-10-CM | POA: Diagnosis present

## 2012-03-22 DIAGNOSIS — K603 Anal fistula, unspecified: Secondary | ICD-10-CM | POA: Diagnosis present

## 2012-03-22 DIAGNOSIS — N19 Unspecified kidney failure: Secondary | ICD-10-CM

## 2012-03-22 DIAGNOSIS — I10 Essential (primary) hypertension: Secondary | ICD-10-CM

## 2012-03-22 DIAGNOSIS — Z21 Asymptomatic human immunodeficiency virus [HIV] infection status: Secondary | ICD-10-CM

## 2012-03-22 DIAGNOSIS — E871 Hypo-osmolality and hyponatremia: Secondary | ICD-10-CM | POA: Diagnosis present

## 2012-03-22 DIAGNOSIS — F1020 Alcohol dependence, uncomplicated: Secondary | ICD-10-CM | POA: Diagnosis present

## 2012-03-22 DIAGNOSIS — A419 Sepsis, unspecified organism: Principal | ICD-10-CM | POA: Diagnosis present

## 2012-03-22 DIAGNOSIS — D649 Anemia, unspecified: Secondary | ICD-10-CM | POA: Diagnosis present

## 2012-03-22 DIAGNOSIS — N485 Ulcer of penis: Secondary | ICD-10-CM | POA: Diagnosis present

## 2012-03-22 DIAGNOSIS — K610 Anal abscess: Secondary | ICD-10-CM | POA: Diagnosis present

## 2012-03-22 DIAGNOSIS — K5730 Diverticulosis of large intestine without perforation or abscess without bleeding: Secondary | ICD-10-CM | POA: Diagnosis present

## 2012-03-22 DIAGNOSIS — J90 Pleural effusion, not elsewhere classified: Secondary | ICD-10-CM | POA: Diagnosis not present

## 2012-03-22 DIAGNOSIS — K612 Anorectal abscess: Secondary | ICD-10-CM | POA: Diagnosis present

## 2012-03-22 DIAGNOSIS — L0291 Cutaneous abscess, unspecified: Secondary | ICD-10-CM | POA: Diagnosis present

## 2012-03-22 DIAGNOSIS — K529 Noninfective gastroenteritis and colitis, unspecified: Secondary | ICD-10-CM | POA: Diagnosis present

## 2012-03-22 DIAGNOSIS — E8809 Other disorders of plasma-protein metabolism, not elsewhere classified: Secondary | ICD-10-CM | POA: Diagnosis present

## 2012-03-22 DIAGNOSIS — M726 Necrotizing fasciitis: Secondary | ICD-10-CM | POA: Diagnosis present

## 2012-03-22 DIAGNOSIS — F17200 Nicotine dependence, unspecified, uncomplicated: Secondary | ICD-10-CM | POA: Diagnosis present

## 2012-03-22 DIAGNOSIS — D509 Iron deficiency anemia, unspecified: Secondary | ICD-10-CM

## 2012-03-22 DIAGNOSIS — F1410 Cocaine abuse, uncomplicated: Secondary | ICD-10-CM | POA: Diagnosis present

## 2012-03-22 DIAGNOSIS — L02219 Cutaneous abscess of trunk, unspecified: Secondary | ICD-10-CM | POA: Diagnosis present

## 2012-03-22 DIAGNOSIS — H169 Unspecified keratitis: Secondary | ICD-10-CM | POA: Diagnosis present

## 2012-03-22 DIAGNOSIS — B20 Human immunodeficiency virus [HIV] disease: Secondary | ICD-10-CM | POA: Diagnosis present

## 2012-03-22 DIAGNOSIS — B009 Herpesviral infection, unspecified: Secondary | ICD-10-CM | POA: Diagnosis present

## 2012-03-22 DIAGNOSIS — Z23 Encounter for immunization: Secondary | ICD-10-CM

## 2012-03-22 DIAGNOSIS — F1210 Cannabis abuse, uncomplicated: Secondary | ICD-10-CM | POA: Diagnosis present

## 2012-03-22 DIAGNOSIS — L03319 Cellulitis of trunk, unspecified: Secondary | ICD-10-CM | POA: Diagnosis present

## 2012-03-22 DIAGNOSIS — R6521 Severe sepsis with septic shock: Secondary | ICD-10-CM

## 2012-03-22 DIAGNOSIS — N179 Acute kidney failure, unspecified: Secondary | ICD-10-CM | POA: Diagnosis not present

## 2012-03-22 DIAGNOSIS — N5089 Other specified disorders of the male genital organs: Secondary | ICD-10-CM | POA: Diagnosis present

## 2012-03-22 HISTORY — DX: Personal history of other infectious and parasitic diseases: Z86.19

## 2012-03-22 HISTORY — DX: Human immunodeficiency virus (HIV) disease: B20

## 2012-03-22 LAB — LACTIC ACID, PLASMA: Lactic Acid, Venous: 1.5 mmol/L (ref 0.5–2.2)

## 2012-03-22 LAB — COMPREHENSIVE METABOLIC PANEL
Alkaline Phosphatase: 66 U/L (ref 39–117)
BUN: 31 mg/dL — ABNORMAL HIGH (ref 6–23)
Calcium: 10.5 mg/dL (ref 8.4–10.5)
GFR calc Af Amer: 83 mL/min — ABNORMAL LOW (ref >90)
GFR calc non Af Amer: 71 mL/min — ABNORMAL LOW (ref >90)
Glucose, Bld: 133 mg/dL — ABNORMAL HIGH (ref 70–99)
Total Protein: 8.6 g/dL — ABNORMAL HIGH (ref 6.0–8.3)

## 2012-03-22 LAB — CBC WITH DIFFERENTIAL/PLATELET
Basophils Absolute: 0 10*3/uL (ref 0.0–0.1)
Lymphs Abs: 0.7 10*3/uL (ref 0.7–4.0)
MCH: 25.2 pg — ABNORMAL LOW (ref 26.0–34.0)
MCHC: 31.9 g/dL (ref 30.0–36.0)
MCV: 78.9 fL (ref 78.0–100.0)
Monocytes Absolute: 0.8 10*3/uL (ref 0.1–1.0)
Platelets: 344 10*3/uL (ref 150–400)
RDW: 15.8 % — ABNORMAL HIGH (ref 11.5–15.5)
WBC: 8.8 10*3/uL (ref 4.0–10.5)

## 2012-03-22 LAB — RETICULOCYTES
RBC.: 2.97 MIL/uL — ABNORMAL LOW (ref 4.22–5.81)
Retic Count, Absolute: 29.7 10*3/uL (ref 19.0–186.0)

## 2012-03-22 LAB — PREPARE RBC (CROSSMATCH)

## 2012-03-22 MED ORDER — HYDROMORPHONE HCL PF 1 MG/ML IJ SOLN
1.0000 mg | Freq: Once | INTRAMUSCULAR | Status: AC
  Administered 2012-03-23: 1 mg via INTRAVENOUS
  Filled 2012-03-22: qty 1

## 2012-03-22 MED ORDER — VANCOMYCIN HCL IN DEXTROSE 1-5 GM/200ML-% IV SOLN
1000.0000 mg | Freq: Once | INTRAVENOUS | Status: AC
  Administered 2012-03-22: 1000 mg via INTRAVENOUS
  Filled 2012-03-22: qty 200

## 2012-03-22 MED ORDER — SODIUM CHLORIDE 0.9 % IV BOLUS (SEPSIS)
1000.0000 mL | Freq: Once | INTRAVENOUS | Status: AC
  Administered 2012-03-22: 1000 mL via INTRAVENOUS

## 2012-03-22 MED ORDER — ONDANSETRON HCL 4 MG/2ML IJ SOLN
4.0000 mg | Freq: Once | INTRAMUSCULAR | Status: AC
  Administered 2012-03-23: 4 mg via INTRAVENOUS
  Filled 2012-03-22: qty 2

## 2012-03-22 MED ORDER — ACYCLOVIR 400 MG PO TABS
400.0000 mg | ORAL_TABLET | Freq: Every day | ORAL | Status: DC
  Administered 2012-03-23 (×2): 400 mg via ORAL
  Filled 2012-03-22 (×7): qty 1

## 2012-03-22 MED ORDER — DIPHENHYDRAMINE HCL 25 MG PO CAPS: 25.0000 mg | ORAL_CAPSULE | Freq: Once | ORAL | Status: AC

## 2012-03-22 MED ORDER — ONDANSETRON HCL 4 MG/2ML IJ SOLN
4.0000 mg | Freq: Once | INTRAMUSCULAR | Status: AC
  Administered 2012-03-22: 4 mg via INTRAVENOUS
  Filled 2012-03-22: qty 2

## 2012-03-22 MED ORDER — MORPHINE SULFATE 4 MG/ML IJ SOLN
4.0000 mg | Freq: Once | INTRAMUSCULAR | Status: AC
  Administered 2012-03-22: 4 mg via INTRAVENOUS
  Filled 2012-03-22: qty 1

## 2012-03-22 MED ORDER — DIPHENHYDRAMINE HCL 50 MG/ML IJ SOLN
12.5000 mg | Freq: Once | INTRAMUSCULAR | Status: AC
  Administered 2012-03-23: 12.5 mg via INTRAMUSCULAR
  Filled 2012-03-22: qty 1

## 2012-03-22 MED ORDER — PIPERACILLIN-TAZOBACTAM 3.375 G IVPB
3.3750 g | Freq: Once | INTRAVENOUS | Status: AC
  Administered 2012-03-22: 3.375 g via INTRAVENOUS
  Filled 2012-03-22: qty 50

## 2012-03-22 NOTE — ED Provider Notes (Signed)
Patient brought to fast track awaiting bed in the acute care ED.  The patient reports a draining sore on his buttocks for the past year, now having worsening pain and drainage in the pubic region as well.  He denies any fever or chills.  No chest pain or shortness of breath.    On exam, his blood pressure is 85/52, but remainder of vitals are stable and he is afebrile.  In general, the patient is unkempt.  The heart and lung exam is unremarkable.  There is an open sore in the pubic region just above his penis that is draining purulent material and is quite malodorous.  There is also an area in the right buttock near the rectum that is draining malodorous purulent material.    I have initiated the workup while waiting for an acute care bed.  This includes blood cultures times two, labs, and I have ordered vancomycin and zosyn for the skin infections.    Geoffery Lyons, MD 03/24/12 [phone]

## 2012-03-22 NOTE — H&P (Signed)
Hospital Admission Note Date: 03/23/2012  Patient name: Leon Nichols Medical record number: [id number] Date of birth: Nov 01, 1965 Age: 46 y.o. Gender: male PCP: Lehman Prom, NP  Medical Service: Internal Medicine Teaching Service  Attending physician:  Dr. Dalphine Handing   1st Contact: Dr. Virgina Organ   Pager:[phone] 2nd Contact: Dr. Everardo Beals   Pager:[phone] After 5 pm or weekends: 1st Contact:      Pager: [phone] 2nd Contact:      Pager: [phone]  Chief Complaint: Pain in rectum   History of Present Illness: Leon Nichols is a 46 yo AA man with PMH significant for HTN, nonspecific colitis and diverticulosis per colonoscopy in 5/13 by Dr. Marina Goodell, and Herpes Zoster in 6/13 complicated by left Ramsey-Hunt Syndrome who presents to the [facility] ED with rectal pain. The pain started one year ago with a boil in his rectum, he was seen at an Urgent care initially, given some antibiotics and some improvement. He was admitted to the [facility] ED in May with colonoscopy showing colitis but no Crohn's, and was treated with antibiotics. He was admitted again in June for Herpes Zoster complicated by Ramsey-Hunt Syndrome and also received treatment for his perianal abscess and colitis.  He does not have a PCP and has not been able to follow up with GI. In September he had one ED visit for evaluation of itchy, painful, superficial lesions on his penis, suprapubic, and scrotal area thought to be likely herpetic with superimposed infection. The lesions had started as a suprapubic boil which he treated with pork salt and vaseline. He was treated with acyclovir and Keflex and discharged home. Both the pararectal and suprapubic skin infections continued to have discharge with increased pain that worsened in the past month. He has been cleaning the suprapubic wound with hydrogen peroxide. He has noticed that when he walks the discharge from his suprapubic wound increases in quantity and he has increased pain. It is difficult for him to keep  the wound covered up with gauze because of its location. He has been unable to work for the last 44 months secondary to pain from his wounds. He has had boils under his arms but they have gone away after they self-drained. He has had skin boils since he started drinking at age of 46. He has a chronic papular rash with eschars over his arms, buttocks, and legs.   He has had dizziness, he feels weak, with no energy to stand up and cook. His appetite is normal but he reports a 30-40Lbs weight loss. He feels cold often. He has a BM every 1-2 days with bright red blood per rectum with amount described as several spoonfulls. He describes a sense of "pooling" when he has a BM and he is unable to sit down to [place], he has to stand to have a BM.   He denies fever, chills, cough, shortness of breath, sore throat, chest pain or palpitations, abdominal pain, constipation, diarrhea, or dysuria.   He has been without a drink for two months. He used to drink 1/5 of a pint and a 12 pack in one sitting.   He has no family history of colon cancer of colitis.   Meds: Current Outpatient Rx  Name  Route  Sig  Dispense  Refill  . LISINOPRIL 40 MG PO TABS   Oral   Take 1 tablet (40 mg total) by mouth daily.   30 tablet   1     Allergies: Allergies as of 03/22/2012  . (No Known  Allergies)   Past Medical History  Diagnosis Date  . Hypertension   . Diverticulosis   . Hemorrhoids   . Perirectal abscess   . Colitis    Past Surgical History  Procedure Date  . Colonoscopy 08/31/2011    Procedure: COLONOSCOPY;  Surgeon: Hilarie Fredrickson, MD;  Location: [facility] ENDOSCOPY;  Service: Endoscopy;  Laterality: N/A;   Family History  Problem Relation Age of Onset  . Hypertension Mother   . Diabetes Mother    History   Social History  . Marital Status: Married    Spouse Name: N/A    Number of Children: N/A  . Years of Education: N/A   Occupational History  . Not on file.   Social History Main Topics  .  Smoking status: Current Every Day Smoker -- 0.2 packs/day for 15 years    Types: Cigarettes  . Smokeless tobacco: Not on file  . Alcohol Use: Yes  . Drug Use: Yes    Special: Marijuana, Cocaine  . Sexually Active: Yes   Other Topics Concern  . Not on file   Social History Narrative  . No narrative on file    Review of Systems: Pertinent items are noted in HPI.  Physical Exam: Blood pressure 106/74, pulse 114, temperature 98.1 F (36.7 C), temperature source Oral, resp. rate 14, SpO2 100.00%. BP 106/74  Pulse 114  Temp 98.1 F (36.7 C) (Oral)  Resp 14  SpO2 100%  General Appearance:    Alert, cooperative, no distress, appears stated age  Head:    Normocephalic, without obvious abnormality, atraumatic  Eyes:    Pupils constricted to 2mm bilaterally, and non-reactive to light. Left conjunctive with erythema with one central area of ulceration with scant purulent discharge. Corneas clear, EOM's intact,          Nose:   Nares normal, septum midline, mucosa normal, no drainage    or sinus tenderness  Throat:   Lips, mucosa, and tongue normal; teeth and gums normal  Neck:   Supple, symmetrical, trachea midline, no adenopathy;         Back:     Symmetric, no curvature, ROM normal, no CVA tenderness  Lungs:     Clear to auscultation bilaterally, respirations unlabored  Chest wall:    No tenderness or deformity  Heart:    Tachycardic, no murmur, rub   or gallop  Abdomen:     Soft, non-tender, bowel sounds active all four quadrants,    no masses, no organomegaly  Genitalia:    Edema surrounding his penis extending down to his scrotum bilaterally, with punctuated lesions that also cover his penis. No crepitus. Purulent malodorous discharge from ~4cm open wound at the central suprapubic area. No penile discharge.   Rectal:    A ~6cm fistula noted in the perianal area with purulent malodorous discharge tinged with blood. Rectal tone and exam not performed secondary to pain.    Extremities:   Extremities normal, atraumatic, no cyanosis or edema  Pulses:   2+ and symmetric all extremities  Skin:   Skin color, texture, turgor normal. Diffuse papular rash covered with eschar over his arms, legs, and buttocks.      Neurologic:   Alert and Oriented x3. CNII-XII intact. Normal strength, sensation and reflexes      throughout    Lab results: Basic Metabolic Panel:  Basename 03/22/12 1856  NA 131*  K 4.0  CL 96  CO2 23  GLUCOSE 133*  BUN 31*  CREATININE  1.21  CALCIUM 10.5  MG --  PHOS --   Liver Function Tests:  [facility] 03/22/12 1856  AST 13  ALT 6  ALKPHOS 66  BILITOT 0.3  PROT 8.6*  ALBUMIN 2.5*   CBC:  Basename 03/22/12 1856  WBC 8.8  NEUTROABS 7.1  HGB 7.4*  HCT 23.2*  MCV 78.9  PLT 344   Anemia Panel:  Basename 03/22/12 1856  VITAMINB12 --  FOLATE --  FERRITIN --  TIBC --  IRON --  RETICCTPCT 1.0   Urine Drug Screen: Drugs of Abuse     Component Value Date/Time   LABOPIA NONE DETECTED 07/12/2008 0454   COCAINSCRNUR POSITIVE* 07/12/2008 0454   LABBENZ NONE DETECTED 07/12/2008 0454   AMPHETMU NONE DETECTED 07/12/2008 0454   THCU NONE DETECTED 07/12/2008 0454   LABBARB  Value: NONE DETECTED        DRUG SCREEN FOR MEDICAL PURPOSES ONLY.  IF CONFIRMATION IS NEEDED FOR ANY PURPOSE, NOTIFY LAB WITHIN 5 DAYS.        LOWEST DETECTABLE LIMITS FOR URINE DRUG SCREEN Drug Class       Cutoff (ng/mL) Amphetamine      1000 Barbiturate      200 Benzodiazepine   200 Tricyclics       300 Opiates          300 Cocaine          300 THC              50 07/12/2008 0454     Imaging results:  Ct Pelvis W Contrast  03/23/2012  *RADIOLOGY REPORT*  Clinical Data:  Suprapubic/perianal region wounds.  CT PELVIS WITH CONTRAST  Technique:  Multidetector CT imaging of the pelvis was performed using the standard protocol following the bolus administration of intravenous contrast.  Contrast: OMNIPAQUE IOHEXOL 300 MG/ML  SOLN  Comparison:  08/29/2011  Findings:   Visualized intrapelvic contents show pericolonic fat stranding at the descending colon with prominent adjacent lymph nodes up to 7 mm short axis.  The previously described cutaneous ulcer and fissure along the left side of the back, extending perianal and toward the coccyx is again noted, with interval increased associated gas.  The process is in communication with tracks of gas and soft tissue coursing along the posterior subcutaneous fat and anteriorly to involve the perineum and suprapubic region. There is no associated loculated/drainable fluid collection.  Prominent inguinal lymph nodes, measuring up to 1.5 cm on the left.  No acute osseous finding.  IMPRESSION: Multiple gas containing tracks within the posterior subcutaneous tissues, perineum, and suprapubic region.  Centrally, the tracks extend perianal and to abut the coccyx. If acute, conditions such as necrotizing fasciitis should be excluded.  If chronic, recommend correlation with the patient's immune status for inflammatory conditions such as Crohn disease.  Inguinal lymphadenopathy may be reactive.  Lymphoma should be excluded.  Pericolonic fat stranding and wall thickening involving the descending colon.  Prominent pericolonic lymph nodes.  Differential for this appearance also includes inflammatory bowel disease and lymphoma.  Correlate with colonoscopy if not yet performed.  Discussed via telephone with the admitting service at 01:40 a.m. on 03/23/2012.  Reportedly, this is a chronic process that is progressing, and the patient has had a recent colonoscopy.   Original Report Authenticated By: Jearld Lesch, M.D.     Assessment & Plan by Problem:  Multiple Abscess of skin and subcutaneous tissue. He has history of boils since age of 89 that resolve on  their own. However, he now has genital ulcer that appear ulcerated and could be associated with chronic infectious process such as HSV, CMV (less likely), gonorrhea (though no penile  discharge), Chlamydia, and possibly Syphilis with likely superimposed infection, that perhaps could have become resistant to previous antibiotic treatments. Dermatitis herpetiformis, although with an atypical presentation if present, is also considered. Behet's disease is unlikely given no report of recurrent oral aphthae. Back in June, he had an ear wound culture that grew Staph aureus and Strep Group G, with no antibiotic resistance and he likely has bacterial infection at this time, either as superimposed or as a primary infection. He is afebrile, with no leukocytosis, and his lactic acid in not elevated. CT suprapubic/perinial concerning for necrotizing fasciitis and Fournier gangrene with multiple gas containing tracks.  -Admit to [facility] -General surgery contacted (Dr.Wyatt) would like to be formally consulted in AM -Continue Vancomycin, Zosyn, and Acyclovir (for possible HSV genital ulcers and keratitis as well) -Blood cultures x2 -Wound culture-HSV PCR,  -CMV quant -RPR, HIV -GC/Chlamydia urine -ID consult in AM -Dermatology consult in AM -possible punch biopsy -Dilaudid 1mg  q4h PRN (morphine made him itch)  Hypotension.  Likely secondary to infection described in #1. Initial BP in ED at 85/62, up to 106/74 with 3L NS boluses. Will hold his home Lisinopril in the setting of hypotension.   Microcytic Anemia. Baseline Hgb appears to be 12-14. On admission his Hg is 7.4. We will transfuse 2 units of pRBC.  -CBC post transfusion and q8h -GI consult -FOBT ordered (although he does have BRBPR on exam). FOBT in May was positive -f/u anemia panel   Prerenal azotemia. Baseline creatinine of 0.95. He is volume contracted with BUN increased to 31 and creatinine is elevated to 1.21, his BUN:Cr ratio is 25, indicating a prerenal azotemia.  -urine Na and Cr -UA -IVF NS @125ml /hr  Keratitis. This is likely multifactorial but could be secondary to herpes, gonorrhea (though only scant discharge),  but could also be bacterial.  -Continue Acyclovir for possible viral keratitis -Ophthalmology consult  Colitis/Perianal fistula. This is chronic, he could not follow up with GI as outpatient. His colonoscopy in May showed nonspecific colitis.  -GI consult in AM  History of alcohol abuse. Last drink reported over 2 months ago. He has history of heavy drinking in the past and self-reported EtOH abstinence is questionable.  -CIWA  Dispo: Disposition is deferred at this time, awaiting improvement of current medical problems. Anticipated discharge in approximately 2-3 day(s).   The patient does not have a current PCP (MARTIN,NYKEDTRA, NP), therefore will be requiring OPC follow-up after discharge.   The patient does have transportation limitations that hinder transportation to clinic appointments.  Signed: Sara Chu D 03/23/2012, 2:03 AM

## 2012-03-22 NOTE — ED Provider Notes (Signed)
46 year old male has chronic draining perianal ulcers which have been getting worse. He states for the last month he has felt weak and occasionally dizzy. He is not aware of any fever or chills or sweats in his been no nausea or vomiting. He says he was just trying to tough it out at home but couldn't take it anymore. On exam, he was initially noted to be hypotensive and tachycardic. Lungs are clear and heart has regular rate and rhythm. Abdomen is soft and nontender. He has purulent drainage from skin ulcers in the perianal area and in the pubic area. He was initially treated with aggressive IV hydration with reduction in heart rate and improvement in blood pressure, blood pressure is now dropped down again and he was being given additional IV fluids. Of note, his lactic acid level is normal. He is noted to be significantly anemic and has been a slight increase in his creatinine. He is empirically started on antibiotics for presumed sepsis and he may need blood transfusion in light of his anemia. RBC indices are noted to be normal along with normal RDW which may indicate acute blood loss, but not chronic blood loss. Anemia panel has been drawn.  Blood pressure once again dropped and required additional IV hydration to maintain adequate blood pressure. However, in spite of episodes of hypotension, his lactic acid levels are normal. Arrangements have been made for hospital depression and consideration for surgery referral.  CRITICAL CARE Performed by: ZOXWR,UEAVW   Total critical care time: 95 minutes  Critical care time was exclusive of separately billable procedures and treating other patients.  Critical care was necessary to treat or prevent imminent or life-threatening deterioration.  Critical care was time spent personally by me on the following activities: development of treatment plan with patient and/or surrogate as well as nursing, discussions with consultants, evaluation of patient's response  to treatment, examination of patient, obtaining history from patient or surrogate, ordering and performing treatments and interventions, ordering and review of laboratory studies, ordering and review of radiographic studies, pulse oximetry and re-evaluation of patient's condition.   I saw and evaluated the patient, reviewed the resident's note and I agree with the findings and plan.   Dione Booze, MD 03/23/12 [phone]

## 2012-03-22 NOTE — ED Notes (Signed)
Per EMS, pt has multiple abscesses "between the hips", strong odor.  Per pt they have been draining for about a year.  Pt also recently had shingles.

## 2012-03-22 NOTE — ED Provider Notes (Signed)
History     CSN: [id number]  Arrival date & time 03/22/12  1803   First MD Initiated Contact with Patient 03/22/12 1902     Chief Complaint  Patient presents with  . Abscess   HPI: Leon Nichols is a 46 yo AAM with history of HTN and perirectal abscess presents with worsening pain to chronic suprapubic and perirectal wounds, as well as dizziness. Perirectal abscess has been present for one year per patient report. Occasionally these wounds will have malodorous drainage. Has been admitted to our institution in the past and treated for antibiotics. He has had w/u for Chron's disease which was negative. Three months ago he developed vesicle to suprapubic region which spontaneously erupted and clear fluid drained. Following this he had several additional vesicles develop around his penis and inner thigh. For the last several days he has noted worsening pain at these sites as well as worsening redness and foul smelling drainage. Over this period of time he has noted intermittent dizziness, usually upon standing. Today he presents due to worsening pain.   Past Medical History  Diagnosis Date  . Hypertension   . Diverticulosis   . Hemorrhoids   . Perirectal abscess   . Colitis     Past Surgical History  Procedure Date  . Colonoscopy 08/31/2011    Procedure: COLONOSCOPY;  Surgeon: Hilarie Fredrickson, MD;  [facility] ENDOSCOPY;  Service: Endoscopy;  Laterality: N/A;    Family History  Problem Relation Age of Onset  . Hypertension Mother   . Diabetes Mother     History  Substance Use Topics  . Smoking status: Current Every Day Smoker -- 0.2 packs/day for 15 years    Types: Cigarettes  . Smokeless tobacco: Not on file  . Alcohol Use: Yes     Review of Systems  Constitutional: Positive for appetite change (decreased) and fatigue. Negative for fever and chills.  HENT: Negative for trouble swallowing, neck pain and neck stiffness.   Eyes: Negative for photophobia and visual disturbance.    Respiratory: Negative for cough and shortness of breath.   Cardiovascular: Negative for chest pain and palpitations.  Gastrointestinal: Positive for anal bleeding. Negative for nausea, vomiting, abdominal pain and diarrhea.  Genitourinary: Negative for dysuria, urgency and decreased urine volume.  Musculoskeletal: Positive for myalgias and gait problem. Negative for back pain and arthralgias.  Skin: Positive for wound. Negative for pallor.  Neurological: Positive for dizziness. Negative for syncope, speech difficulty and headaches.  Psychiatric/Behavioral: Negative for confusion and agitation.  All other systems reviewed and are negative.   Allergies  Review of patient's allergies indicates no known allergies.  Home Medications   Current Outpatient Rx  Name  Route  Sig  Dispense  Refill  . LISINOPRIL 40 MG PO TABS   Oral   Take 1 tablet (40 mg total) by mouth daily.   30 tablet   1    BP 106/74  Pulse 114  Temp 98.1 F (36.7 C) (Oral)  Resp 14  SpO2 100%  Physical Exam  Constitutional: He is oriented to person, place, and time. He is cooperative. He appears distressed (in pain).       Thin male, unkempt appearance, malodorous drainage from wounds noted upon entering room   HENT:  Head: Normocephalic and atraumatic.  Mouth/Throat: Oropharynx is clear and moist and mucous membranes are normal.  Eyes: Conjunctivae normal and EOM are normal. Pupils are equal, round, and reactive to light.  Neck: Normal range of motion. Neck  supple. No JVD present.  Cardiovascular: Normal rate, regular rhythm, S1 normal, S2 normal and normal heart sounds.   No murmur heard. Pulmonary/Chest: Effort normal and breath sounds normal. He has no decreased breath sounds. He has no rales.  Abdominal: Soft. Normal appearance and bowel sounds are normal. There is no tenderness. There is no CVA tenderness.  Genitourinary:        Neurological: He is alert and oriented to person, place, and time.  GCS eye subscore is 4. GCS verbal subscore is 5. GCS motor subscore is 6.  Skin: Skin is warm and dry.   ED Course  Procedures   Labs Reviewed  CBC WITH DIFFERENTIAL - Abnormal; Notable for the following:    RBC 2.94 (*)     Hemoglobin 7.4 (*)     HCT 23.2 (*)     MCH 25.2 (*)     RDW 15.8 (*)     Neutrophils Relative 81 (*)     Lymphocytes Relative 8 (*)     All other components within normal limits  COMPREHENSIVE METABOLIC PANEL - Abnormal; Notable for the following:    Sodium 131 (*)     Glucose, Bld 133 (*)     BUN 31 (*)     Total Protein 8.6 (*)     Albumin 2.5 (*)     GFR calc non Af Amer 71 (*)     GFR calc Af Amer 83 (*)     All other components within normal limits  RETICULOCYTES - Abnormal; Notable for the following:    RBC. 2.97 (*)     All other components within normal limits  LACTIC ACID, PLASMA  TYPE AND SCREEN  ABO/RH  PREPARE RBC (CROSSMATCH)  CULTURE, BLOOD (ROUTINE X 2)  CULTURE, BLOOD (ROUTINE X 2)  VITAMIN B12  FOLATE  IRON AND TIBC  FERRITIN  HIV ANTIBODY (ROUTINE TESTING)  RPR  GC/CHLAMYDIA PROBE AMP  WOUND CULTURE  HSV(HERPES SMPLX)ABS-I+II(IGG+IGM)-BLD  WOUND CULTURE  CBC  CBC  CBC  BASIC METABOLIC PANEL  URINALYSIS, ROUTINE W REFLEX MICROSCOPIC   MDM  45 yo AAM with history of HTN and perirectal abscess presents with worsening pain to chronic suprapubic and perirectal wounds, as well as dizziness. Afebrile, hypotensive to 85/52, tachycardic to 118. Two liter IVF's given shortly after arrival. Feel presentation likely sepsis from underlying chronic wounds. No fluctuance. Doubt necrotizing fascitis but will obtain pelvis CT to evaluate further. Treated with small doses of pain medication due to intermittent hypotension. He has responded well to IVF's, given a total of 3 L. Blood cultures drawn, given Vanc and Zosyn. Labs reveal Hgb 7.4 (baseline 12), unknown source of bleeding but likely from wound which are oozing blood. No reported dark  stools. Type and screen sent. Will hold off on transfusing now as Hgb >7 but will likely require transfusion during his admission. Na 131, glucose 133, cr 1.2, BUN 31, lactic acid 1.5, WBC 8.8. Patient admitted to teaching service, he remained in stable condition. Pain improved with pain medication.   Reviewed imaging, labs and previous medical records, utilized in MDM  Discussed case with Dr. Preston Fleeting  Clinical Impression 1. Sepsis from chronic wounds 2. Normocytic anemia 3. Hyponatremia 4. Hypoalbuminemia      Margie Billet, MD 03/23/12 [phone]

## 2012-03-23 ENCOUNTER — Encounter (HOSPITAL_COMMUNITY): Payer: Self-pay | Admitting: *Deleted

## 2012-03-23 ENCOUNTER — Inpatient Hospital Stay (HOSPITAL_COMMUNITY): Payer: Medicaid Other

## 2012-03-23 ENCOUNTER — Encounter (HOSPITAL_COMMUNITY)
Admission: EM | Disposition: A | Discharge status: Home / Self Care | Payer: Self-pay | Source: Home / Self Care | Attending: Internal Medicine

## 2012-03-23 ENCOUNTER — Inpatient Hospital Stay (HOSPITAL_COMMUNITY): Payer: Medicaid Other | Admitting: Anesthesiology

## 2012-03-23 ENCOUNTER — Encounter (HOSPITAL_COMMUNITY): Payer: Self-pay | Admitting: Anesthesiology

## 2012-03-23 DIAGNOSIS — L0291 Cutaneous abscess, unspecified: Secondary | ICD-10-CM | POA: Diagnosis present

## 2012-03-23 DIAGNOSIS — D649 Anemia, unspecified: Secondary | ICD-10-CM | POA: Diagnosis present

## 2012-03-23 DIAGNOSIS — K603 Anal fistula: Secondary | ICD-10-CM

## 2012-03-23 DIAGNOSIS — K610 Anal abscess: Secondary | ICD-10-CM | POA: Diagnosis present

## 2012-03-23 DIAGNOSIS — I959 Hypotension, unspecified: Secondary | ICD-10-CM | POA: Diagnosis present

## 2012-03-23 DIAGNOSIS — M726 Necrotizing fasciitis: Secondary | ICD-10-CM | POA: Diagnosis present

## 2012-03-23 DIAGNOSIS — N485 Ulcer of penis: Secondary | ICD-10-CM | POA: Diagnosis present

## 2012-03-23 DIAGNOSIS — N5089 Other specified disorders of the male genital organs: Secondary | ICD-10-CM | POA: Diagnosis present

## 2012-03-23 DIAGNOSIS — H169 Unspecified keratitis: Secondary | ICD-10-CM | POA: Diagnosis present

## 2012-03-23 DIAGNOSIS — A419 Sepsis, unspecified organism: Principal | ICD-10-CM

## 2012-03-23 DIAGNOSIS — K612 Anorectal abscess: Secondary | ICD-10-CM

## 2012-03-23 DIAGNOSIS — R6521 Severe sepsis with septic shock: Secondary | ICD-10-CM

## 2012-03-23 HISTORY — PX: INCISION AND DRAINAGE PERIRECTAL ABSCESS: SHX1804

## 2012-03-23 LAB — CBC WITH DIFFERENTIAL/PLATELET
Basophils Relative: 0 % (ref 0–1)
Eosinophils Relative: 1 % (ref 0–5)
Hemoglobin: 6.4 g/dL — CL (ref 13.0–17.0)
Lymphocytes Relative: 12 % (ref 12–46)
MCH: 26.2 pg (ref 26.0–34.0)
Monocytes Absolute: 0.8 10*3/uL (ref 0.1–1.0)
Monocytes Relative: 8 % (ref 3–12)
Neutrophils Relative %: 79 % — ABNORMAL HIGH (ref 43–77)
Platelets: 293 10*3/uL (ref 150–400)
RBC: 2.44 MIL/uL — ABNORMAL LOW (ref 4.22–5.81)
WBC: 9.8 10*3/uL (ref 4.0–10.5)

## 2012-03-23 LAB — COMPREHENSIVE METABOLIC PANEL
ALT: <5 U/L (ref 0–53)
AST: 10 U/L (ref 0–37)
Alkaline Phosphatase: 47 U/L (ref 39–117)
CO2: 20 mEq/L (ref 19–32)
Calcium: 8.4 mg/dL (ref 8.4–10.5)
GFR calc Af Amer: >90 mL/min (ref >90)
GFR calc non Af Amer: >90 mL/min (ref >90)
Glucose, Bld: 101 mg/dL — ABNORMAL HIGH (ref 70–99)
Potassium: 3.7 mEq/L (ref 3.5–5.1)
Sodium: 135 mEq/L (ref 135–145)

## 2012-03-23 LAB — BASIC METABOLIC PANEL
GFR calc Af Amer: >90 mL/min (ref >90)
GFR calc non Af Amer: >90 mL/min (ref >90)
Potassium: 3.6 mEq/L (ref 3.5–5.1)
Sodium: 134 mEq/L — ABNORMAL LOW (ref 135–145)

## 2012-03-23 LAB — PROCALCITONIN: Procalcitonin: <0.1 ng/mL

## 2012-03-23 LAB — POCT I-STAT EG7
Acid-base deficit: 5 mmol/L — ABNORMAL HIGH (ref 0.0–2.0)
O2 Saturation: 100 %
Patient temperature: 37.9
TCO2: 22 mmol/L (ref 0–100)
pCO2, Ven: 39.6 mmHg — ABNORMAL LOW (ref 45.0–50.0)
pO2, Ven: 188 mmHg — ABNORMAL HIGH (ref 30.0–45.0)

## 2012-03-23 LAB — TISSUE TRANSGLUTAMINASE, IGA: Tissue Transglutaminase Ab, IgA: 6.9 U/mL (ref <20)

## 2012-03-23 LAB — IRON AND TIBC
Iron: 11 ug/dL — ABNORMAL LOW (ref 42–135)
TIBC: 186 ug/dL — ABNORMAL LOW (ref 215–435)
UIBC: 175 ug/dL (ref 125–400)

## 2012-03-23 LAB — CBC
Hemoglobin: 7 g/dL — ABNORMAL LOW (ref 13.0–17.0)
MCHC: 33.2 g/dL (ref 30.0–36.0)
Platelets: 229 10*3/uL (ref 150–400)
RBC: 2.58 MIL/uL — ABNORMAL LOW (ref 4.22–5.81)
RDW: 15.5 % (ref 11.5–15.5)
WBC: 8.7 10*3/uL (ref 4.0–10.5)

## 2012-03-23 LAB — RPR: RPR Ser Ql: NONREACTIVE

## 2012-03-23 LAB — URINALYSIS, ROUTINE W REFLEX MICROSCOPIC
Nitrite: NEGATIVE
Specific Gravity, Urine: 1.02 (ref 1.005–1.030)
Urobilinogen, UA: 1 mg/dL (ref 0.0–1.0)

## 2012-03-23 LAB — APTT: aPTT: 38 seconds — ABNORMAL HIGH (ref 24–37)

## 2012-03-23 LAB — RAPID URINE DRUG SCREEN, HOSP PERFORMED
Cocaine: NOT DETECTED
Opiates: POSITIVE — AB
Tetrahydrocannabinol: NOT DETECTED

## 2012-03-23 LAB — HIV ANTIBODY (ROUTINE TESTING W REFLEX): HIV: REACTIVE — AB

## 2012-03-23 LAB — LACTIC ACID, PLASMA: Lactic Acid, Venous: 0.7 mmol/L (ref 0.5–2.2)

## 2012-03-23 LAB — MRSA PCR SCREENING: MRSA by PCR: NEGATIVE

## 2012-03-23 LAB — URINE MICROSCOPIC-ADD ON

## 2012-03-23 LAB — PREPARE RBC (CROSSMATCH)

## 2012-03-23 LAB — PROTIME-INR: INR: 1.35 (ref 0.00–1.49)

## 2012-03-23 SURGERY — INCISION AND DRAINAGE, ABSCESS, PERIRECTAL
Anesthesia: General | Wound class: Dirty or Infected

## 2012-03-23 MED ORDER — SODIUM CHLORIDE 0.9 % IV BOLUS (SEPSIS)
1000.0000 mL | Freq: Once | INTRAVENOUS | Status: AC
  Administered 2012-03-23: 1000 mL via INTRAVENOUS

## 2012-03-23 MED ORDER — FENTANYL CITRATE 0.05 MG/ML IJ SOLN
INTRAMUSCULAR | Status: DC | PRN
  Administered 2012-03-23 (×2): 50 ug via INTRAVENOUS

## 2012-03-23 MED ORDER — NOREPINEPHRINE BITARTRATE 1 MG/ML IJ SOLN
2.0000 ug/min | INTRAVENOUS | Status: DC
  Administered 2012-03-23: 3 ug/min via INTRAVENOUS
  Filled 2012-03-23 (×3): qty 4

## 2012-03-23 MED ORDER — ACETAMINOPHEN 10 MG/ML IV SOLN
1000.0000 mg | Freq: Four times a day (QID) | INTRAVENOUS | Status: AC
  Administered 2012-03-23 – 2012-03-24 (×4): 1000 mg via INTRAVENOUS
  Filled 2012-03-23 (×4): qty 100

## 2012-03-23 MED ORDER — GLYCOPYRROLATE 0.2 MG/ML IJ SOLN
INTRAMUSCULAR | Status: DC | PRN
  Administered 2012-03-23: .6 mg via INTRAVENOUS

## 2012-03-23 MED ORDER — SODIUM CHLORIDE 0.9 % IV SOLN
INTRAVENOUS | Status: DC
  Administered 2012-03-23: 03:00:00 via INTRAVENOUS

## 2012-03-23 MED ORDER — PROMETHAZINE HCL 25 MG/ML IJ SOLN: 6.2500 mg | INTRAMUSCULAR | Status: DC | PRN

## 2012-03-23 MED ORDER — SODIUM CHLORIDE 0.9 % IV BOLUS (SEPSIS)
500.0000 mL | Freq: Once | INTRAVENOUS | Status: AC
  Administered 2012-03-23: 500 mL via INTRAVENOUS

## 2012-03-23 MED ORDER — IOHEXOL 300 MG/ML  SOLN
100.0000 mL | Freq: Once | INTRAMUSCULAR | Status: AC | PRN
  Administered 2012-03-23: 100 mL via INTRAVENOUS

## 2012-03-23 MED ORDER — ONDANSETRON HCL 4 MG/2ML IJ SOLN: 4.0000 mg | Freq: Four times a day (QID) | INTRAMUSCULAR | Status: DC | PRN

## 2012-03-23 MED ORDER — ACETAMINOPHEN 650 MG RE SUPP: 650.0000 mg | Freq: Four times a day (QID) | RECTAL | Status: DC | PRN

## 2012-03-23 MED ORDER — ACETAMINOPHEN 325 MG PO TABS: 650.0000 mg | ORAL_TABLET | Freq: Four times a day (QID) | ORAL | Status: DC | PRN

## 2012-03-23 MED ORDER — LACTATED RINGERS IV SOLN: INTRAVENOUS | Status: DC

## 2012-03-23 MED ORDER — NEOSTIGMINE METHYLSULFATE 1 MG/ML IJ SOLN
INTRAMUSCULAR | Status: DC | PRN
  Administered 2012-03-23: 4 mg via INTRAVENOUS

## 2012-03-23 MED ORDER — ONDANSETRON HCL 4 MG PO TABS: 4.0000 mg | ORAL_TABLET | Freq: Four times a day (QID) | ORAL | Status: DC | PRN

## 2012-03-23 MED ORDER — ONDANSETRON HCL 4 MG/2ML IJ SOLN
INTRAMUSCULAR | Status: DC | PRN
  Administered 2012-03-23: 4 mg via INTRAVENOUS

## 2012-03-23 MED ORDER — 0.9 % SODIUM CHLORIDE (POUR BTL) OPTIME
TOPICAL | Status: DC | PRN
  Administered 2012-03-23: 1000 mL

## 2012-03-23 MED ORDER — HYDROMORPHONE HCL PF 1 MG/ML IJ SOLN
0.2500 mg | INTRAMUSCULAR | Status: DC | PRN
  Administered 2012-03-23 (×2): 0.5 mg via INTRAVENOUS

## 2012-03-23 MED ORDER — MIDAZOLAM HCL 5 MG/5ML IJ SOLN
INTRAMUSCULAR | Status: DC | PRN
  Administered 2012-03-23: 2 mg via INTRAVENOUS

## 2012-03-23 MED ORDER — SODIUM CHLORIDE 0.9 % IJ SOLN
3.0000 mL | Freq: Two times a day (BID) | INTRAMUSCULAR | Status: DC
  Administered 2012-03-23 – 2012-04-01 (×5): 3 mL via INTRAVENOUS

## 2012-03-23 MED ORDER — SODIUM CHLORIDE 0.9 % IV SOLN
INTRAVENOUS | Status: DC
  Administered 2012-03-23 – 2012-03-24 (×2): via INTRAVENOUS

## 2012-03-23 MED ORDER — HYDROMORPHONE HCL PF 1 MG/ML IJ SOLN
1.0000 mg | INTRAMUSCULAR | Status: DC | PRN
  Administered 2012-03-23 (×2): 1 mg via INTRAVENOUS
  Filled 2012-03-23 (×2): qty 1

## 2012-03-23 MED ORDER — PHENYLEPHRINE HCL 10 MG/ML IJ SOLN
INTRAMUSCULAR | Status: DC | PRN
  Administered 2012-03-23 (×2): 120 ug via INTRAVENOUS

## 2012-03-23 MED ORDER — PIPERACILLIN-TAZOBACTAM 3.375 G IVPB
3.3750 g | Freq: Three times a day (TID) | INTRAVENOUS | Status: DC
  Administered 2012-03-23 – 2012-03-27 (×12): 3.375 g via INTRAVENOUS
  Filled 2012-03-23 (×16): qty 50

## 2012-03-23 MED ORDER — PROPOFOL 10 MG/ML IV BOLUS
INTRAVENOUS | Status: DC | PRN
  Administered 2012-03-23: 160 mg via INTRAVENOUS

## 2012-03-23 MED ORDER — CLINDAMYCIN PHOSPHATE 900 MG/50ML IV SOLN
900.0000 mg | Freq: Four times a day (QID) | INTRAVENOUS | Status: DC
  Administered 2012-03-23 – 2012-03-24 (×4): 900 mg via INTRAVENOUS
  Filled 2012-03-23 (×6): qty 50

## 2012-03-23 MED ORDER — LACTATED RINGERS IV SOLN
INTRAVENOUS | Status: DC | PRN
  Administered 2012-03-23 (×2): via INTRAVENOUS

## 2012-03-23 MED ORDER — SODIUM CHLORIDE 0.9 % IV SOLN
Freq: Once | INTRAVENOUS | Status: AC
  Administered 2012-03-23: 500 mL via INTRAVENOUS

## 2012-03-23 MED ORDER — ROCURONIUM BROMIDE 100 MG/10ML IV SOLN
INTRAVENOUS | Status: DC | PRN
  Administered 2012-03-23: 50 mg via INTRAVENOUS

## 2012-03-23 MED ORDER — SODIUM CHLORIDE 0.9 % IV SOLN
INTRAVENOUS | Status: DC
  Administered 2012-03-23: 1000 mL via INTRAVENOUS

## 2012-03-23 MED ORDER — FENTANYL CITRATE 0.05 MG/ML IJ SOLN
100.0000 ug | INTRAMUSCULAR | Status: DC | PRN
  Administered 2012-03-23 – 2012-03-24 (×6): 100 ug via INTRAVENOUS
  Filled 2012-03-23 (×6): qty 2

## 2012-03-23 MED ORDER — VANCOMYCIN HCL IN DEXTROSE 1-5 GM/200ML-% IV SOLN
1000.0000 mg | Freq: Three times a day (TID) | INTRAVENOUS | Status: DC
  Administered 2012-03-23 – 2012-03-28 (×16): 1000 mg via INTRAVENOUS
  Filled 2012-03-23 (×18): qty 200

## 2012-03-23 MED ORDER — SODIUM CHLORIDE 0.9 % IV SOLN: 750.0000 mL | INTRAVENOUS | Status: DC | PRN

## 2012-03-23 MED ORDER — LIDOCAINE HCL (CARDIAC) 20 MG/ML IV SOLN
INTRAVENOUS | Status: DC | PRN
  Administered 2012-03-23: 100 mg via INTRAVENOUS

## 2012-03-23 SURGICAL SUPPLY — 35 items
BANDAGE GAUZE ELAST BULKY 4 IN (GAUZE/BANDAGES/DRESSINGS) ×4 IMPLANT
BLADE SURG 15 STRL LF DISP TIS (BLADE) ×1 IMPLANT
BLADE SURG 15 STRL SS (BLADE) ×2
CANISTER SUCTION 2500CC (MISCELLANEOUS) ×2 IMPLANT
CLEANER TIP ELECTROSURG 2X2 (MISCELLANEOUS) ×2 IMPLANT
CLOTH BEACON ORANGE TIMEOUT ST (SAFETY) ×2 IMPLANT
COVER SURGICAL LIGHT HANDLE (MISCELLANEOUS) ×2 IMPLANT
DRAPE UTILITY 15X26 W/TAPE STR (DRAPE) ×4 IMPLANT
DRSG ADAPTIC 3X8 NADH LF (GAUZE/BANDAGES/DRESSINGS) ×2 IMPLANT
DRSG PAD ABDOMINAL 8X10 ST (GAUZE/BANDAGES/DRESSINGS) ×2 IMPLANT
ELECT REM PT RETURN 9FT ADLT (ELECTROSURGICAL) ×2
ELECTRODE REM PT RTRN 9FT ADLT (ELECTROSURGICAL) ×1 IMPLANT
GAUZE PACKING IODOFORM 1 (PACKING) ×2 IMPLANT
GAUZE SPONGE 4X4 16PLY XRAY LF (GAUZE/BANDAGES/DRESSINGS) ×2 IMPLANT
GLOVE BIOGEL PI IND STRL 8 (GLOVE) ×1 IMPLANT
GLOVE BIOGEL PI INDICATOR 8 (GLOVE) ×1
GLOVE SS BIOGEL STRL SZ 7.5 (GLOVE) ×1 IMPLANT
GLOVE SUPERSENSE BIOGEL SZ 7.5 (GLOVE) ×1
GOWN PREVENTION PLUS XLARGE (GOWN DISPOSABLE) ×2 IMPLANT
GOWN STRL NON-REIN LRG LVL3 (GOWN DISPOSABLE) ×2 IMPLANT
KIT BASIN OR (CUSTOM PROCEDURE TRAY) ×2 IMPLANT
KIT ROOM TURNOVER OR (KITS) ×2 IMPLANT
NS IRRIG 1000ML POUR BTL (IV SOLUTION) ×2 IMPLANT
PACK LITHOTOMY IV (CUSTOM PROCEDURE TRAY) ×2 IMPLANT
PAD ARMBOARD 7.5X6 YLW CONV (MISCELLANEOUS) ×4 IMPLANT
PENCIL BUTTON HOLSTER BLD 10FT (ELECTRODE) ×2 IMPLANT
SPONGE GAUZE 4X4 12PLY (GAUZE/BANDAGES/DRESSINGS) ×2 IMPLANT
SWAB COLLECTION DEVICE MRSA (MISCELLANEOUS) ×2 IMPLANT
TOWEL OR 17X24 6PK STRL BLUE (TOWEL DISPOSABLE) ×2 IMPLANT
TOWEL OR 17X26 10 PK STRL BLUE (TOWEL DISPOSABLE) ×2 IMPLANT
TUBE ANAEROBIC SPECIMEN COL (MISCELLANEOUS) ×2 IMPLANT
TUBE CONNECTING 12X1/4 (SUCTIONS) ×2 IMPLANT
UNDERPAD 30X30 INCONTINENT (UNDERPADS AND DIAPERS) ×2 IMPLANT
WATER STERILE IRR 1000ML POUR (IV SOLUTION) ×2 IMPLANT
YANKAUER SUCT BULB TIP NO VENT (SUCTIONS) ×2 IMPLANT

## 2012-03-23 NOTE — Addendum Note (Signed)
Addendum  created 03/23/12 1627 by Bedelia Person, MD   Modules edited:PRL Based Order Sets

## 2012-03-23 NOTE — Progress Notes (Signed)
Patient ID: Leon Nichols, male   DOB: 1965/06/04, 46 y.o.   MRN: [id number] I have examined and interviewed pt, agree with Dr Lindie Spruce.  Likely has necrotizing soft tissue infection and will require exploration and debridement in OR.  He understands and agrees

## 2012-03-23 NOTE — H&P (Signed)
Internal Medicine Teaching Service Attending Note Date: 03/23/2012  Patient name: Leon Nichols  Medical record number: [id number]  Date of birth: [birth year]   Chief Complaint  Rectal Pain    History of Present Illness  The patient, Leon Nichols, is a 46 y.o. 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 I have read the history documented by Dr.Kennerly and I concur with the chronology of events.  When I met with the patient today at about 930am, he was in pain. He said that he was in extreme pain. His wounds started towards the beginning of this year, were present like boils on his penis and buttocks with purulent discharge around September when he visited an ED for it, and now have progressed to being unbearable at this point. He feels weak and dizzy.    Past Medical History   has a past medical history of Hypertension; Diverticulosis; Hemorrhoids; Perirectal abscess; Colitis; and History of shingles.   Medications  Lisinopril for hypertension   Family and Social History   reports that he has been smoking Cigarettes.  He has a 3.75 pack-year smoking history. He does not have any smokeless tobacco history on file. He reports that he drinks alcohol. He reports that he uses illicit drugs (Marijuana and Cocaine). family history includes Diabetes in his mother and Hypertension in his mother.   Review of systems   Constitutional:  Admits fatigue. Denies fever, chills, diaphoresis, appetite change.  HEENT: Denies photophobia, eye pain, redness, hearing loss, ear pain, congestion, sore throat, rhinorrhea, sneezing, neck pain, neck stiffness and tinnitus.  Respiratory: Denies SOB, DOE, cough, chest tightness, and wheezing.  Cardiovascular: Denies chest pain, palpitations and leg swelling.  Gastrointestinal: Denies  nausea, vomiting, abdominal pain, diarrhea, constipation, blood in stool.  Genitourinary: Denies dysuria, urgency, frequency, hematuria, flank pain and difficulty urinating.  Musculoskeletal: Denies myalgias, back pain, joint swelling, arthralgias and gait problem.   Skin: Multiple wounds including the one on the genitals and buttock.  Neurological: Admits dizziness. Denies seizures, syncope, weakness, light-headedness, numbness and headaches.   Hematological: Denies adenopathy, easy bruising, personal or family bleeding history.  Psychiatric/ Behavioral: Denies suicidal ideation, mood changes, confusion, nervousness, sleep disturbance and agitation.     Serial Vitals  Filed Vitals:   03/23/12 1100 03/23/12 1115 03/23/12 1210 03/23/12 1220  BP: 101/62 120/66    Pulse: 101 109 103 107  Temp:      TempSrc:      Resp: 29 23    Height:      Weight:      SpO2: 100% 92% 99% 98%      Physical Exam  I met with patient around 930am today  General: In pain, lying down tuned to side. Cannot touch buttocks to bed.  HEENT: PERRL, EOMI, no scleral icterus. Left scleral ulcer with purulent discharge in the canthus of the eye. Heart: Tachycardic, no rubs, murmurs or gallops. Lungs: Clear to auscultation bilaterally, no wheezes, rales, or rhonchi. Abdomen: Soft, nontender, nondistended, BS present. Pus draining from umblicus Genitalia: Wide skin openings, purulent malodorous discharge in the pubic, scrotal and penile area.  Rectal: Perianal opening >6 cm with other smaller openings surrounding, draining pus and feces.  Extremities: Warm, no pedal edema. Brown macular lesions 0.5 - 1 cm in diameter. Neuro: Alert and oriented X3, grossly intact neurologically.  Lab results  CMP     Component Value Date/Time   NA 134* 03/23/2012 1039   NA 135 03/23/2012 1039   K 3.6 03/23/2012 1039   K 3.7 03/23/2012 1039   CL 105 03/23/2012 1039   CL 105 03/23/2012 1039   CO2 20 03/23/2012 1039   CO2  20 03/23/2012 1039   GLUCOSE 103* 03/23/2012 1039   GLUCOSE 101* 03/23/2012 1039   BUN 15 03/23/2012 1039   BUN 15 03/23/2012 1039   CREATININE 0.82 03/23/2012 1039   CREATININE 0.85 03/23/2012 1039   CALCIUM 8.5 03/23/2012 1039   CALCIUM 8.4 03/23/2012 1039   PROT 6.3 03/23/2012 1039   ALBUMIN 1.8* 03/23/2012 1039   AST 10 03/23/2012 1039   ALT <5 03/23/2012 1039   ALKPHOS 47 03/23/2012 1039   BILITOT 0.5 03/23/2012 1039   GFRNONAA >90 03/23/2012 1039   GFRNONAA >90 03/23/2012 1039   GFRAA >90 03/23/2012 1039   GFRAA >90 03/23/2012 1039   CBC    Component Value Date/Time   WBC 9.7 03/23/2012 1039   WBC 9.8 03/23/2012 1039   RBC 2.40* 03/23/2012 1039   RBC 2.44* 03/23/2012 1039   HGB 6.4* 03/23/2012 1039   HGB 6.4* 03/23/2012 1039   HCT 19.3* 03/23/2012 1039   HCT 19.7* 03/23/2012 1039   PLT 270 03/23/2012 1039   PLT 293 03/23/2012 1039   MCV 80.4 03/23/2012 1039   MCV 80.7 03/23/2012 1039   MCH 26.7 03/23/2012 1039   MCH 26.2 03/23/2012 1039   MCHC 33.2 03/23/2012 1039   MCHC 32.5 03/23/2012 1039   RDW 15.5 03/23/2012 1039   RDW 15.6* 03/23/2012 1039   LYMPHSABS 1.2 03/23/2012 1039   MONOABS 0.8 03/23/2012 1039   EOSABS 0.1 03/23/2012 1039   BASOSABS 0.0 03/23/2012 1039   Urinalysis    Component Value Date/Time   COLORURINE YELLOW 03/23/2012 0214   APPEARANCEUR CLOUDY* 03/23/2012 0214   LABSPEC 1.020 03/23/2012 0214   PHURINE 5.0 03/23/2012 0214   GLUCOSEU NEGATIVE 03/23/2012 0214   HGBUR NEGATIVE 03/23/2012 0214   HGBUR negative 03/29/2007 0859   BILIRUBINUR NEGATIVE 03/23/2012 0214   KETONESUR NEGATIVE 03/23/2012 0214   PROTEINUR NEGATIVE 03/23/2012 0214   UROBILINOGEN 1.0 03/23/2012 0214   NITRITE NEGATIVE 03/23/2012 0214   LEUKOCYTESUR MODERATE* 03/23/2012 0214      Imaging results  Ct Pelvis W Contrast  03/23/2012  *RADIOLOGY REPORT*  Clinical Data:  Suprapubic/perianal region wounds.  CT PELVIS WITH CONTRAST  Technique:  Multidetector CT  imaging of the pelvis was performed using the standard protocol following the bolus administration of intravenous contrast.  Contrast: OMNIPAQUE IOHEXOL 300 MG/ML  SOLN  Comparison:  08/29/2011  Findings:  Visualized intrapelvic contents show pericolonic fat stranding at the descending colon with prominent adjacent lymph nodes up to 7 mm short axis.  The previously described cutaneous ulcer and fissure along the left side of the back, extending perianal and toward the coccyx is again noted, with interval increased associated gas.  The process is in communication with tracks of gas and soft tissue coursing along the posterior subcutaneous fat and anteriorly to involve the perineum and suprapubic region. There is no associated loculated/drainable fluid collection.  Prominent inguinal lymph nodes, measuring up to 1.5 cm on the left.  No acute osseous finding.  IMPRESSION: Multiple gas containing tracks within the posterior subcutaneous tissues, perineum, and suprapubic region.  Centrally, the tracks extend perianal and to abut the coccyx. If acute, conditions such as necrotizing  fasciitis should be excluded.  If chronic, recommend correlation with the patient's immune status for inflammatory conditions such as Crohn disease.  Inguinal lymphadenopathy may be reactive.  Lymphoma should be excluded.  Pericolonic fat stranding and wall thickening involving the descending colon.  Prominent pericolonic lymph nodes.  Differential for this appearance also includes inflammatory bowel disease and lymphoma.  Correlate with colonoscopy if not yet performed.  Discussed via telephone with the admitting service at 01:40 a.m. on 03/23/2012.  Reportedly, this is a chronic process that is progressing, and the patient has had a recent colonoscopy.   Original Report Authenticated By: Jearld Lesch, M.D.    Dg Chest Port 1 View  03/23/2012  *RADIOLOGY REPORT*  Clinical Data: Central line placement.  PORTABLE CHEST - 1 VIEW   Comparison: Earlier film, same date.  Findings: The left subclavian central venous catheter tip is in the mid SVC at the level of the carina.  Low lung volumes with vascular crowding and atelectasis.  No pneumothorax.  IMPRESSION:  1.  Left subclavian central venous catheter tip is in the mid SVC. No complicating features. 2.  Low lung volumes with vascular crowding and atelectasis.   Original Report Authenticated By: Rudie Meyer, M.D.    Dg Chest Port 1 View  03/23/2012  *RADIOLOGY REPORT*  Clinical Data: Preoperative radiograph for wound debridement.  PORTABLE CHEST - 1 VIEW  Comparison: 09/23/2011  Findings: Mild left lung base opacity, likely scarring or atelectasis.  Otherwise, no confluent airspace opacity, pleural effusion, or pneumothorax.  Cardiomediastinal contours within normal range.  No acute osseous finding.  IMPRESSION: Mild left lung base opacity, likely scarring or atelectasis.   Original Report Authenticated By: Jearld Lesch, M.D.      Assessment and plan  This is a 46 year old male with fulminating soft and subcutaneous tissue infection in the genital and rectal region, presumed necrotizing fascitis unless ruled out, much like Fournier's Gangrene in the genital region, who might be in sepsis progressing towards septic shock.   He does not have a past medical history for HIV or any other immuno-compromising disorder, however he does fall into a high risk category given his drug use in the past, and unprotected intercourse that he admits to. Also, the kind of infection history that he has with Herpes Zoster and repeated furunclitis by his own admission points towards the same.      The patient was already started on Vancomycin and Zocyn. We will add Clindamycin to cover clostridium and double coverage for anaerobes. A surgeon is already here to evaluate him and he will be wheeled in shortly to the ER for exploration, drainage and debridement. He is being resuscitated with normal  saline and we are planning to put a central line in. PCCM has been contacted and they are here to transfer him to ICU.     Other chronic issues per resident note.    Thanks, Aletta Edouard, MD 12/19/20131:40 PM

## 2012-03-23 NOTE — Preoperative (Signed)
Beta Blockers   Reason not to administer Beta Blockers:Not Applicable 

## 2012-03-23 NOTE — Anesthesia Procedure Notes (Signed)
Procedure Name: Intubation Date/Time: 03/23/2012 12:55 PM Performed by: Rogelia Boga Pre-anesthesia Checklist: Patient identified, Emergency Drugs available, Suction available, Patient being monitored and Timeout performed Patient Re-evaluated:Patient Re-evaluated prior to inductionOxygen Delivery Method: Circle system utilized Preoxygenation: Pre-oxygenation with 100% oxygen Intubation Type: IV induction Ventilation: Mask ventilation without difficulty Laryngoscope Size: Mac and 4 Grade View: Grade II Tube type: Oral Tube size: 7.5 mm Number of attempts: 1 Airway Equipment and Method: Stylet Placement Confirmation: ETT inserted through vocal cords under direct vision,  positive ETCO2 and breath sounds checked- equal and bilateral Secured at: 22 cm Tube secured with: Tape Dental Injury: Teeth and Oropharynx as per pre-operative assessment  Comments: Pt intubated by Mila Homer, EMT-P

## 2012-03-23 NOTE — Anesthesia Postprocedure Evaluation (Signed)
  Anesthesia Post-op Note  Patient: Leon Nichols  Procedure(s) Performed: Procedure(s) (LRB) with comments: IRRIGATION AND DEBRIDEMENT PERIRECTAL ABSCESS (N/A) - I & D soft tissue scrotal and perineal abscess  Patient Location: PACU  Anesthesia Type:General  Level of Consciousness: awake  Airway and Oxygen Therapy: Patient Spontanous Breathing  Post-op Pain: mild  Post-op Assessment: Post-op Vital signs reviewed, Patient's Cardiovascular Status Stable, Respiratory Function Stable, Patent Airway, No signs of Nausea or vomiting and Pain level controlled  Post-op Vital Signs: stable  Complications: No apparent anesthesia complications

## 2012-03-23 NOTE — Procedures (Signed)
Arterial Catheter Insertion Procedure Note Leon Nichols [id number] 28-Nov-1965  Procedure: Insertion of Arterial Catheter  Indications: Blood pressure monitoring  Procedure Details Consent: Risks of procedure as well as the alternatives and risks of each were explained to the (patient/caregiver).  Consent for procedure obtained. Time Out: Verified patient identification, verified procedure, site/side was marked, verified correct patient position, special equipment/implants available, medications/allergies/relevent history reviewed, required imaging and test results available.  Performed  Maximum sterile technique was used including antiseptics, cap, gloves, gown, hand hygiene, mask and sheet. Skin prep: Chlorhexidine; local anesthetic administered 20 gauge catheter was inserted into right radial artery using the Seldinger technique.  Evaluation Blood flow good; BP tracing good. Complications: No apparent complications.   Leon Nichols 03/23/2012

## 2012-03-23 NOTE — ED Notes (Signed)
Patient with ongoing drainage from multiple wounds to buttocks and scrotum/groin.  Patient with ongoing hypotension and antibiotic therapy.  Patient repositioned frequently,  Prefers to remain on his right side due to pain.  Patient with no noted adverse reactions to meds/blood.  Patient with central line placement to left upper chest/subclavian.  Patient with confirmation of placement via xray.  Patient with complaints of pain,  Will talk with MD due to hypotension

## 2012-03-23 NOTE — ED Notes (Signed)
Patient critical hemaglobin called to Dr Sharol Harness,  He is ordering 2 additional units rbc's

## 2012-03-23 NOTE — Progress Notes (Signed)
Blood administered via pump at 125 cc and hour for 15 minutes

## 2012-03-23 NOTE — Op Note (Signed)
Preoperative Diagnosis: Perianal cellulitis [566] Septic shock [[id number]] [ scrotal abscess  Postoprative Diagnosis: Perianal cellulitis [566] Septic shock [[id number]] [ scrotal abscess  Procedure: Procedure(s): IRRIGATION AND DEBRIDEMENT PERIRECTAL ABSCESS   Surgeon: Glenna Fellows T   Assistants: None  Anesthesia:  General endotracheal anesthesiaDiagnos  Indications:   Patient is a 46 year old male who presented to the emergency room today with evidence of sepsis complaining of pain and drainage from his perineum. He states this had been going on for a number of weeks we have been trying to treat it at home. He has no previous history of any similar problems. Examination has revealed multiple draining wounds in his perirectal area and perineum and also around his pubis and scrotum. These are tender and difficult to examine with foul-smelling drainage. After resuscitation and antibiotics in the emergency room I recommend proceeding with exam under anesthesia with incision and drainage as needed. This was discussed with the patient including indications and possible extent of surgery and he is brought to the operating room for this procedure  Procedure Detail:  Patient brought to the operating room and placed in the supine this on the operating table. General endotracheal anesthesia was induced. He was carefully positioned in lithotomy position. The perineum and genitalia were widely sterilely prepped and draped. Correct patient procedure was verified. Examination revealed multiple wounds mostly in the perirectal and perineal area. There is a 5 x 5 cm widely open wound in the right anterior perirectal space. On examination this communicated widely into the right perirectal space. I could easily pass a finger along the right perirectal space and toward the rectal wall. A rectal retractor was placed and there were actually 2 large openings in the anus and rectum on the right side, one at the  dentate line and one about 4 or 5 cm above this laterally. There did not appear to be any necrotic tissue. There had been a small bridge of skin through the midpoint of this wound which was excised. There did not appear to be any necrotic tissue. There was a 5 x 1 cm wound more laterally in the right perineum that tracked subcutaneously anteriorly up alongside the scrotum with multiple fistulas draining tracks to the skin superiorly. This wound was opened superiorly excising the drainage tracts and thin skin creating a wound in total about 10 x 1-2 cm. Again there was no necrotic tissue with the clean granulation base and this did not appear to track deeply. In the left perineum/perirectal space was a 5 x 5 cm wound with clean granulation and also tracked subcutaneously along the left side of the scrotum with some draining fistulous tracts along the direction. I didn't excise the overlying skin and fistulous tracts widely open this wound. There were 21 cm wounds posteriorly on the left buttock that tracked up toward the anus. Again no necrotic tissue. There was a skin bridge between these 2 was excised to allow wider drainage. On either side of the mons toward the thigh creases were a couple of approximately 3 x 2 cm superficial wounds that did not appear to track or contain any necrotic tissue. There was a lot of superficial erosions around the base and shaft of the penis but again no necrotic tissue or deep tracts or purulent drainage. After widely exploring and washing all these out these were packed with moist saline gauze. 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 He clearly appears that the patient will need a divergent colostomy  at some point. Sponge needle and instrument counts were correct.  Findings: As detailed above the  Estimated Blood Loss:  less than 100 mL         Drains: wounds packed with moist saline gauze  Blood Given: none           Specimens: the skin and subcutaneous tissue        Complications:  * No complications entered in OR log *         Disposition: PACU - hemodynamically stable.         Condition: stable  Mariella Saa MD, FACS  03/23/2012, 2:06 PM

## 2012-03-23 NOTE — Procedures (Signed)
Central Venous Catheter Insertion Procedure Note GID SCHOFFSTALL [id number] [birth year]  Procedure: Insertion of Central Venous Catheter Indications: Assessment of intravascular volume, Drug and/or fluid administration and Frequent blood sampling  Procedure Details Consent: Risks of procedure as well as the alternatives and risks of each were explained to the (patient/caregiver).  Consent for procedure obtained. Time Out: Verified patient identification, verified procedure, site/side was marked, verified correct patient position, special equipment/implants available, medications/allergies/relevent history reviewed, required imaging and test results available.  Performed  Maximum sterile technique was used including antiseptics, cap, gloves, gown, hand hygiene, mask and sheet. Skin prep: Chlorhexidine; local anesthetic administered A antimicrobial bonded/coated triple lumen catheter was placed in the left subclavian vein using the Seldinger technique.  Evaluation Blood flow good Complications: No apparent complications Patient did tolerate procedure well. Chest X-ray ordered to verify placement.  CXR: pending.  BABCOCK,PETE 03/23/2012, 10:41 AM   I was present for and supervised the entire procedure  Billy Fischer, MD ; [facility] [phone].  After 5:30 PM or weekends, call [phone]

## 2012-03-23 NOTE — Anesthesia Preprocedure Evaluation (Addendum)
Anesthesia Evaluation  Patient identified by MRN, date of birth, ID band Patient awake    Reviewed: Allergy & Precautions, H&P , NPO status , Patient's Chart, lab work & pertinent test results  Airway Mallampati: II TM Distance: >3 FB Neck ROM: Full    Dental  (+) Dental Advisory Given   Pulmonary  + rhonchi         Cardiovascular hypertension, Pt. on medications Rhythm:Regular Rate:Normal     Neuro/Psych    GI/Hepatic (+)     substance abuse  cocaine use and marijuana use,   Endo/Other    Renal/GU      Musculoskeletal   Abdominal   Peds  Hematology   Anesthesia Other Findings   Reproductive/Obstetrics                         Anesthesia Physical Anesthesia Plan  ASA: II  Anesthesia Plan: General   Post-op Pain Management:    Induction: Intravenous  Airway Management Planned: Oral ETT  Additional Equipment:   Intra-op Plan:   Post-operative Plan: Extubation in OR  Informed Consent: I have reviewed the patients History and Physical, chart, labs and discussed the procedure including the risks, benefits and alternatives for the proposed anesthesia with the patient or authorized representative who has indicated his/her understanding and acceptance.   Dental advisory given  Plan Discussed with: CRNA, Surgeon and Anesthesiologist  Anesthesia Plan Comments:        Anesthesia Quick Evaluation

## 2012-03-23 NOTE — ED Notes (Signed)
Report given to OR including critical hgb

## 2012-03-23 NOTE — Progress Notes (Signed)
When blood waw hung order was received from Kirt Boys R N CRNA / and Dr. Gypsy Balsam to give blood brought out from O R in cooler

## 2012-03-23 NOTE — Transfer of Care (Signed)
Immediate Anesthesia Transfer of Care Note  Patient: Leon Nichols  Procedure(s) Performed: Procedure(s) (LRB) with comments: IRRIGATION AND DEBRIDEMENT PERIRECTAL ABSCESS (N/A) - I & D soft tissue scrotal and perineal abscess  Patient Location: PACU  Anesthesia Type:General  Level of Consciousness: awake, alert , oriented and patient cooperative  Airway & Oxygen Therapy: Patient Spontanous Breathing and Patient connected to face mask  Post-op Assessment: Report given to PACU RN, Post -op Vital signs reviewed and stable and Patient moving all extremities X 4  Post vital signs: Reviewed and stable  Complications: No apparent anesthesia complications

## 2012-03-23 NOTE — Consult Note (Signed)
PULMONARY  / CRITICAL CARE MEDICINE  Name: Leon Nichols MRN: [id number] DOB: [birth year]    LOS: 1  REFERRING MD : Betsy Pries  CHIEF COMPLAINT:  Septic shock   BRIEF PATIENT DESCRIPTION:  46 year old male who presents on 12/18 w/  Fournier's gangrene. Developed progressive septic shock on 12/19 for which PCCM was called.   LINES / TUBES:   CULTURES: BCX2 12/18>>> Wound culture 12/19>>>  ANTIBIOTICS: vanc 12/18>>> Zosyn 12/18>>> clinda 12/19>>> Acyclovir 12/18>>>  SIGNIFICANT EVENTS:  CT abd/pelvis 12/18: Multiple gas containing tracks within the posterior subcutaneous tissues, perineum, and suprapubic region. Centrally, the tracks extend perianal and to abut the coccyx   LEVEL OF CARE:  ICU  PRIMARY SERVICE:  PCCM  CONSULTANTS:  gen surg CODE STATUS: Full  DIET:  NPO  DVT Px:  SCD  GI Px:  PPI   HISTORY OF PRESENT ILLNESS:   46 year old male who presents w/ CC: foul-smelling draining scrotal and inguinal abscesses and pain. Has had these 4-6 months. Has not had medical treatment for them. Had been using corn starch and vasoline. Presented to ER because drainage was worse and pain unbearable. Admitted to IM teaching service. Surgical service has seen and plan on bringing him to OR. Since admit he has become progressively hypotensive and therefore PCCM called to assess.   PAST MEDICAL HISTORY :  Past Medical History  Diagnosis Date  . Hypertension   . Diverticulosis   . Hemorrhoids   . Perirectal abscess   . Colitis   . History of shingles    Past Surgical History  Procedure Date  . Colonoscopy 08/31/2011    Procedure: COLONOSCOPY;  Surgeon: Hilarie Fredrickson, MD;  Location: [facility] ENDOSCOPY;  Service: Endoscopy;  Laterality: N/A;   Prior to Admission medications   Medication Sig Start Date End Date Taking? Authorizing Provider  lisinopril (PRINIVIL,ZESTRIL) 40 MG tablet Take 1 tablet (40 mg total) by mouth daily. 09/01/11 08/31/12 Yes Letha Cape, PA   No Known  Allergies  FAMILY HISTORY:  Family History  Problem Relation Age of Onset  . Hypertension Mother   . Diabetes Mother    SOCIAL HISTORY:  reports that he has been smoking Cigarettes.  He has a 3.75 pack-year smoking history. He does not have any smokeless tobacco history on file. He reports that he drinks alcohol. He reports that he uses illicit drugs (Marijuana and Cocaine).   INTERVAL HISTORY:  Now in shock   VITAL SIGNS: Temp:  [98.1 F (36.7 C)-101.3 F (38.5 C)] 101.3 F (38.5 C) (12/19 0845) Pulse Rate:  [104-118] 108  (12/19 0845) Resp:  [14-27] 22  (12/19 0845) BP: (79-112)/(32-74) 87/52 mmHg (12/19 0845) SpO2:  [97 %-100 %] 100 % (12/19 0845) Weight:  [101.7 kg (224 lb 3.3 oz)] 101.7 kg (224 lb 3.3 oz) (12/19 0900) HEMODYNAMICS:   VENTILATOR SETTINGS:   INTAKE / OUTPUT: Intake/Output      12/18 0701 - 12/19 0700 12/19 0701 - 12/20 0700   I.V. (mL/kg) 950 2500 (24.6)   Blood 700.5    Total Intake(mL/kg) 1650.5 2500 (24.6)   Urine (mL/kg/hr) 325 300   Total Output 325 300   Net +1325.5 +2200          PHYSICAL EXAMINATION: General:  Chronically ill appearing AAM, now hypotensive.  Neuro:  Awake, no focal def  HEENT:  [place], no JVD  Cardiovascular:  Tachy rrr  Lungs:  Clear no accessory muscle use.  Abdomen: soft, non-tender  Musculoskeletal:  Generalized weakness  Skin:  Multiple ulcerations. Two large tunneled ulcers in gluteal fold, that are draining. Surrounding ulcers at penis shaft.    LABS: Cbc  Lab 03/22/12 1856  WBC 8.8  HGB 7.4*  HCT 23.2*  PLT 344    Chemistry   Lab 03/22/12 1856  NA 131*  K 4.0  CL 96  CO2 23  BUN 31*  CREATININE 1.21  CALCIUM 10.5  MG --  PHOS --  GLUCOSE 133*    Liver fxn  Lab 03/22/12 1856  AST 13  ALT 6  ALKPHOS 66  BILITOT 0.3  PROT 8.6*  ALBUMIN 2.5*   coags No results found for this basename: APTT:3,INR:3 in the last 168 hours Sepsis markers  Lab 03/22/12 1859  LATICACIDVEN 1.5   PROCALCITON --   Cardiac markers No results found for this basename: CKTOTAL:3,CKMB:3,TROPONINI:3 in the last 168 hours BNP No results found for this basename: PROBNP:3 in the last 168 hours ABG No results found for this basename: PHART:3,PCO2ART:3,PO2ART:3,HCO3:3,TCO2:3 in the last 168 hours  CBG trend No results found for this basename: GLUCAP:5 in the last 168 hours  IMAGING: PCXR: LLL atx  ECG:  DIAGNOSES: Principal Problem:  *Multiple Abscesses of skin and subcutaneous tissue Active Problems:  Colitis  Diverticulosis of colon (without mention of hemorrhage)  Perianal abscess  Perianal fistula  Anemia  Penile ulcer  Non-penile genital ulcer  Hypotension  Keratitis   ASSESSMENT / PLAN:  PULMONARY  ASSESSMENT: LLL atx.  High risk for ALI PLAN:   Pulse ox Anticipate back to ICU on vent Will collaborate w/ surgical services re: timing for weaning as will likely require multiple OR trips.   CARDIOVASCULAR  ASSESSMENT:  Severe sepsis/ septic shock PLAN:  Check lactate Trend PCT Place CVL prob will need to start EGDT protocol   RENAL  ASSESSMENT:   High risk for renal injury in setting of hypoperfusion.  PLAN:   IVF resuscitation Central access Pharmacy assist for dosing meds F/u chemistry   GASTROINTESTINAL  ASSESSMENT:   No active issue PLAN:   SUP  HEMATOLOGIC  ASSESSMENT:   Anemia: likely chronic.  PLAN:  Transfused in setting of sepsis/septic shock and pending OR Will check repeat CBC Will have blood back hold 3 ahead   INFECTIOUS  ASSESSMENT:   Severe sepsis Probable Fournier's gangrene (two large tunneling ulcers in gluteal fold, as well as above and below shaft of penis) and  possible superinfection from HSV PLAN:   Needs emergent surgical debridement  See dashboard Will add clinda, agree w/ vanc, acyclovir and zosyn  ENDOCRINE  ASSESSMENT:   Mild Hyperglycemia  PLAN:   SSI   NEUROLOGIC  ASSESSMENT:   Pain  Due  to multiple draining per-rectal ulcers and ulcers around the penis.  PLAN:   PRN analgesia   CLINICAL SUMMARY: 46 year old w/ septic shock on basis of  Fournier's gangrene. Will place CVL, try to tune him up some so that he can to to OR in next few hours. Will continue broad spec abx, anticipate he will return to ICU on vent and anticipate prolonged hospital course.   I have personally obtained a history, examined the patient, evaluated laboratory and imaging results, formulated the assessment and plan and placed orders. CRITICAL CARE: The patient is critically ill with multiple organ systems failure and requires high complexity decision making for assessment and support, frequent evaluation and titration of therapies, application of advanced monitoring technologies and extensive interpretation of multiple  databases. Critical Care Time devoted to patient care services described in this note is 35 minutes.   Merwyn Katos, MD Pulmonary and Critical Care Medicine [facility] Pager: [phone]  03/23/2012, 9:47 AM

## 2012-03-23 NOTE — Progress Notes (Signed)
W 2012 13 [id number] O positive checked with 2 nurses exp 04/17/2012

## 2012-03-23 NOTE — Progress Notes (Addendum)
ANTIBIOTIC CONSULT NOTE - INITIAL  Pharmacy Consult for Vancomycin/Zosyn/Acyclovir Indication: Empiric coverage of scrotal and inguinal abscesses  No Known Allergies  Patient Measurements: Height: 6' 0.83" (185 cm) Weight: 224 lb 3.3 oz (101.7 kg) IBW/kg (Calculated) : 79.52   Vital Signs: Temp: 101.3 F (38.5 C) (12/19 0845) Temp src: Oral (12/19 0730) BP: 87/52 mmHg (12/19 0845) Pulse Rate: 108  (12/19 0845) Intake/Output from previous day: 12/18 0701 - 12/19 0700 In: 1650.5 [I.V.:950; Blood:700.5] Out: 325 [Urine:325] Intake/Output from this shift:    Labs:  Basename 03/23/12 0214 03/22/12 1856  WBC -- 8.8  HGB -- 7.4*  PLT -- 344  LABCREA 85.09 --  CREATININE -- 1.21   Estimated Creatinine Clearance: 97.4 ml/min (by C-G formula based on Cr of 1.21). No results found for this basename: VANCOTROUGH:2,VANCOPEAK:2,VANCORANDOM:2,GENTTROUGH:2,GENTPEAK:2,GENTRANDOM:2,TOBRATROUGH:2,TOBRAPEAK:2,TOBRARND:2,AMIKACINPEAK:2,AMIKACINTROU:2,AMIKACIN:2, in the last 72 hours   Microbiology: No results found for this or any previous visit (from the past 720 hour(s)).  Medical History: Past Medical History  Diagnosis Date  . Hypertension   . Diverticulosis   . Hemorrhoids   . Perirectal abscess   . Colitis   . History of shingles    Assessment: 46 yoAAm with pain in rectum who presented to [facility] 12/18 for foul-smelling draining of scrotal and inguinal abscesses. To start vanc/zosyn per pharm. Tmax 101.3 and wbc wnl. Vancomycin and Zosyn given in the ED on 12/18. Acyclovir for mucocutaneous HSV.   Noted plans for I&D today. Cultures sent.   Goal of Therapy:  Vancomycin trough level 15-20 mcg/ml  Plan:  Begin Vancomycin 1g IV q8h (borderline for obese dosing, f/u updated weight)  Zosyn 3.375 IV q8h EI Continue acyclovir 400mg  po 5x a day, if patient needs to go  IV can do 500mg  IV q8h.  F/u cultures, trough, clinical course   Thank you,  Brett Fairy, PharmD,  BCPS 03/23/2012 9:25 AM

## 2012-03-23 NOTE — Consult Note (Signed)
Reason for Consult:Draining infection of groin and perineum with concern for necrotizing fasciitis Referring Physician: Hospitalist service  Leon Nichols is an 46 y.o. male.  HPI: The patient has had these draining sinuses for the past 3-4 month.  He has not sought medical attention for this.  Not known to be immunocompromised.  Came in today for foul-smelling draining scrotal and inguinal abscesses.  Past Medical History  Diagnosis Date  . Hypertension   . Diverticulosis   . Hemorrhoids   . Perirectal abscess   . Colitis   . History of shingles     Past Surgical History  Procedure Date  . Colonoscopy 08/31/2011    Procedure: COLONOSCOPY;  Surgeon: Hilarie Fredrickson, MD;  Location: [facility] ENDOSCOPY;  Service: Endoscopy;  Laterality: N/A;    Family History  Problem Relation Age of Onset  . Hypertension Mother   . Diabetes Mother     Social History:  reports that he has been smoking Cigarettes.  He has a 3.75 pack-year smoking history. He does not have any smokeless tobacco history on file. He reports that he drinks alcohol. He reports that he uses illicit drugs (Marijuana and Cocaine).  Allergies: No Known Allergies  Medications: He reports unknown anti-hypertensive medication  Results for orders placed during the hospital encounter of 03/22/12 (from the past 48 hour(s))  CBC WITH DIFFERENTIAL     Status: Abnormal   Collection Time   03/22/12  6:56 PM      Component Value Range Comment   WBC 8.8  4.0 - 10.5 K/uL    RBC 2.94 (*) 4.22 - 5.81 MIL/uL    Hemoglobin 7.4 (*) 13.0 - 17.0 g/dL    HCT 16.1 (*) 09.6 - 52.0 %    MCV 78.9  78.0 - 100.0 fL    MCH 25.2 (*) 26.0 - 34.0 pg    MCHC 31.9  30.0 - 36.0 g/dL    RDW 04.5 (*) 40.9 - 15.5 %    Platelets 344  150 - 400 K/uL    Neutrophils Relative 81 (*) 43 - 77 %    Lymphocytes Relative 8 (*) 12 - 46 %    Monocytes Relative 9  3 - 12 %    Eosinophils Relative 2  0 - 5 %    Basophils Relative 0  0 - 1 %    Neutro Abs 7.1  1.7 - 7.7  K/uL    Lymphs Abs 0.7  0.7 - 4.0 K/uL    Monocytes Absolute 0.8  0.1 - 1.0 K/uL    Eosinophils Absolute 0.2  0.0 - 0.7 K/uL    Basophils Absolute 0.0  0.0 - 0.1 K/uL    RBC Morphology POLYCHROMASIA PRESENT      WBC Morphology ATYPICAL LYMPHOCYTES   TOXIC GRANULATION   Smear Review LARGE PLATELETS PRESENT     COMPREHENSIVE METABOLIC PANEL     Status: Abnormal   Collection Time   03/22/12  6:56 PM      Component Value Range Comment   Sodium 131 (*) 135 - 145 mEq/L    Potassium 4.0  3.5 - 5.1 mEq/L    Chloride 96  96 - 112 mEq/L    CO2 23  19 - 32 mEq/L    Glucose, Bld 133 (*) 70 - 99 mg/dL    BUN 31 (*) 6 - 23 mg/dL    Creatinine, Ser 8.11  0.50 - 1.35 mg/dL    Calcium 91.4  8.4 - 10.5 mg/dL  Total Protein 8.6 (*) 6.0 - 8.3 g/dL    Albumin 2.5 (*) 3.5 - 5.2 g/dL    AST 13  0 - 37 U/L    ALT 6  0 - 53 U/L    Alkaline Phosphatase 66  39 - 117 U/L    Total Bilirubin 0.3  0.3 - 1.2 mg/dL    GFR calc non Af Amer 71 (*) >90 mL/min    GFR calc Af Amer 83 (*) >90 mL/min   RETICULOCYTES     Status: Abnormal   Collection Time   03/22/12  6:56 PM      Component Value Range Comment   Retic Ct Pct 1.0  0.4 - 3.1 %    RBC. 2.97 (*) 4.22 - 5.81 MIL/uL    Retic Count, Manual 29.7  19.0 - 186.0 K/uL   LACTIC ACID, PLASMA     Status: Normal   Collection Time   03/22/12  6:59 PM      Component Value Range Comment   Lactic Acid, Venous 1.5  0.5 - 2.2 mmol/L   TYPE AND SCREEN     Status: Normal (Preliminary result)   Collection Time   03/22/12  8:33 PM      Component Value Range Comment   ABO/RH(D) O POS      Antibody Screen NEG      Sample Expiration 03/25/2012      Unit Number [id number]      Blood Component Type RED CELLS,LR      Unit division 00      Status of Unit ISSUED      Transfusion Status OK TO TRANSFUSE      Crossmatch Result Compatible      Unit Number [id number]      Blood Component Type RED CELLS,LR      Unit division 00      Status of Unit ISSUED       Transfusion Status OK TO TRANSFUSE      Crossmatch Result Compatible     ABO/RH     Status: Normal   Collection Time   03/22/12  8:33 PM      Component Value Range Comment   ABO/RH(D) O POS     VITAMIN B12     Status: Normal   Collection Time   03/22/12  8:37 PM      Component Value Range Comment   Vitamin B-12 448  211 - 911 pg/mL   IRON AND TIBC     Status: Abnormal   Collection Time   03/22/12  8:37 PM      Component Value Range Comment   Iron 11 (*) 42 - 135 ug/dL    TIBC 962 (*) 952 - 841 ug/dL    Saturation Ratios 6 (*) 20 - 55 %    UIBC 175  125 - 400 ug/dL   FERRITIN     Status: Abnormal   Collection Time   03/22/12  8:37 PM      Component Value Range Comment   Ferritin 1317 (*) 22 - 322 ng/mL   PREPARE RBC (CROSSMATCH)     Status: Normal   Collection Time   03/22/12 11:44 PM      Component Value Range Comment   Order Confirmation ORDER PROCESSED BY BLOOD BANK     URINALYSIS, ROUTINE W REFLEX MICROSCOPIC     Status: Abnormal   Collection Time   03/23/12  2:14 AM      Component Value Range Comment  Color, Urine YELLOW  YELLOW    APPearance CLOUDY (*) CLEAR    Specific Gravity, Urine 1.020  1.005 - 1.030    pH 5.0  5.0 - 8.0    Glucose, UA NEGATIVE  NEGATIVE mg/dL    Hgb urine dipstick NEGATIVE  NEGATIVE    Bilirubin Urine NEGATIVE  NEGATIVE    Ketones, ur NEGATIVE  NEGATIVE mg/dL    Protein, ur NEGATIVE  NEGATIVE mg/dL    Urobilinogen, UA 1.0  0.0 - 1.0 mg/dL    Nitrite NEGATIVE  NEGATIVE    Leukocytes, UA MODERATE (*) NEGATIVE   CREATININE, URINE, RANDOM     Status: Normal   Collection Time   03/23/12  2:14 AM      Component Value Range Comment   Creatinine, Urine 85.09     SODIUM, URINE, RANDOM     Status: Normal   Collection Time   03/23/12  2:14 AM      Component Value Range Comment   Sodium, Ur 111     OSMOLALITY, URINE     Status: Normal   Collection Time   03/23/12  2:14 AM      Component Value Range Comment   Osmolality, Ur 562  390 - 1090  mOsm/kg   URINE MICROSCOPIC-ADD ON     Status: Abnormal   Collection Time   03/23/12  2:14 AM      Component Value Range Comment   WBC, UA 3-6  <3 WBC/hpf    RBC / HPF 0-2  <3 RBC/hpf    Bacteria, UA RARE  RARE    Casts HYALINE CASTS (*) NEGATIVE    Crystals CA OXALATE CRYSTALS (*) NEGATIVE TRIPLE PHOSPHATE CRYSTALS   Urine-Other MUCOUS PRESENT       Ct Pelvis W Contrast  03/23/2012  *RADIOLOGY REPORT*  Clinical Data:  Suprapubic/perianal region wounds.  CT PELVIS WITH CONTRAST  Technique:  Multidetector CT imaging of the pelvis was performed using the standard protocol following the bolus administration of intravenous contrast.  Contrast: OMNIPAQUE IOHEXOL 300 MG/ML  SOLN  Comparison:  08/29/2011  Findings:  Visualized intrapelvic contents show pericolonic fat stranding at the descending colon with prominent adjacent lymph nodes up to 7 mm short axis.  The previously described cutaneous ulcer and fissure along the left side of the back, extending perianal and toward the coccyx is again noted, with interval increased associated gas.  The process is in communication with tracks of gas and soft tissue coursing along the posterior subcutaneous fat and anteriorly to involve the perineum and suprapubic region. There is no associated loculated/drainable fluid collection.  Prominent inguinal lymph nodes, measuring up to 1.5 cm on the left.  No acute osseous finding.  IMPRESSION: Multiple gas containing tracks within the posterior subcutaneous tissues, perineum, and suprapubic region.  Centrally, the tracks extend perianal and to abut the coccyx. If acute, conditions such as necrotizing fasciitis should be excluded.  If chronic, recommend correlation with the patient's immune status for inflammatory conditions such as Crohn disease.  Inguinal lymphadenopathy may be reactive.  Lymphoma should be excluded.  Pericolonic fat stranding and wall thickening involving the descending colon.  Prominent  pericolonic lymph nodes.  Differential for this appearance also includes inflammatory bowel disease and lymphoma.  Correlate with colonoscopy if not yet performed.  Discussed via telephone with the admitting service at 01:40 a.m. on 03/23/2012.  Reportedly, this is a chronic process that is progressing, and the patient has had a recent colonoscopy.  Original Report Authenticated By: Jearld Lesch, M.D.    Dg Chest Port 1 View  03/23/2012  *RADIOLOGY REPORT*  Clinical Data: Preoperative radiograph for wound debridement.  PORTABLE CHEST - 1 VIEW  Comparison: 09/23/2011  Findings: Mild left lung base opacity, likely scarring or atelectasis.  Otherwise, no confluent airspace opacity, pleural effusion, or pneumothorax.  Cardiomediastinal contours within normal range.  No acute osseous finding.  IMPRESSION: Mild left lung base opacity, likely scarring or atelectasis.   Original Report Authenticated By: Jearld Lesch, M.D.     ROS Blood pressure 97/69, pulse 105, temperature 98.8 F (37.1 C), temperature source Oral, resp. rate 24, SpO2 100.00%. Physical Exam  Constitutional: He appears well-developed. He appears cachectic.  HENT:  Head: Normocephalic and atraumatic.  Eyes: Conjunctivae normal and EOM are normal. Pupils are equal, round, and reactive to light.  Neck: Normal range of motion.  Cardiovascular: Normal rate, regular rhythm, normal heart sounds and intact distal pulses.   Respiratory: Effort normal and breath sounds normal.  GI: Soft. Bowel sounds are normal.  Genitourinary:    Circumcised. Penile tenderness present.  Musculoskeletal: Normal range of motion.  Neurological: He is alert.  Skin: Skin is warm and dry.  Psychiatric: He has a normal mood and affect. His behavior is normal.    Assessment/Plan: Infected groin and genital area from either severe hidradenitis versus near Fournier's gangrene without sepsis.  Will need debridement in the OR today.  Antibiotics  ordered.  Cherylynn Ridges 03/23/2012, 6:26 AM

## 2012-03-23 NOTE — Progress Notes (Signed)
Pt. Arrival fr. ED into bay [facility] in [place], report rec'd fr, Kristi, IV in R arm(wrist) x2, 1) NS 2)/w Zosyn.

## 2012-03-23 NOTE — Progress Notes (Signed)
Pt has bloody drainage from left ear, consult has been called when patient was in Or

## 2012-03-24 ENCOUNTER — Encounter (HOSPITAL_COMMUNITY): Payer: Self-pay | Admitting: *Deleted

## 2012-03-24 ENCOUNTER — Inpatient Hospital Stay (HOSPITAL_COMMUNITY): Payer: Medicaid Other

## 2012-03-24 DIAGNOSIS — A419 Sepsis, unspecified organism: Secondary | ICD-10-CM

## 2012-03-24 DIAGNOSIS — Z21 Asymptomatic human immunodeficiency virus [HIV] infection status: Secondary | ICD-10-CM

## 2012-03-24 DIAGNOSIS — B20 Human immunodeficiency virus [HIV] disease: Secondary | ICD-10-CM

## 2012-03-24 DIAGNOSIS — R6520 Severe sepsis without septic shock: Secondary | ICD-10-CM

## 2012-03-24 DIAGNOSIS — R6521 Severe sepsis with septic shock: Secondary | ICD-10-CM

## 2012-03-24 HISTORY — DX: Asymptomatic human immunodeficiency virus (hiv) infection status: Z21

## 2012-03-24 HISTORY — DX: Human immunodeficiency virus (HIV) disease: B20

## 2012-03-24 LAB — CBC
Hemoglobin: 7.6 g/dL — ABNORMAL LOW (ref 13.0–17.0)
MCH: 26.7 pg (ref 26.0–34.0)
MCHC: 32.3 g/dL (ref 30.0–36.0)
Platelets: 228 10*3/uL (ref 150–400)
RBC: 2.85 MIL/uL — ABNORMAL LOW (ref 4.22–5.81)

## 2012-03-24 LAB — COMPREHENSIVE METABOLIC PANEL
ALT: <5 U/L (ref 0–53)
AST: 13 U/L (ref 0–37)
Alkaline Phosphatase: 47 U/L (ref 39–117)
CO2: 21 mEq/L (ref 19–32)
Calcium: 8.4 mg/dL (ref 8.4–10.5)
GFR calc Af Amer: >90 mL/min (ref >90)
Glucose, Bld: 86 mg/dL (ref 70–99)
Potassium: 3.7 mEq/L (ref 3.5–5.1)
Sodium: 130 mEq/L — ABNORMAL LOW (ref 135–145)
Total Protein: 6.1 g/dL (ref 6.0–8.3)

## 2012-03-24 LAB — HEPATITIS PANEL, ACUTE
HCV Ab: NEGATIVE
Hep A IgM: NEGATIVE
Hep B C IgM: NEGATIVE
Hepatitis B Surface Ag: NEGATIVE

## 2012-03-24 LAB — TYPE AND SCREEN: Unit division: 0

## 2012-03-24 LAB — GC/CHLAMYDIA PROBE AMP
CT Probe RNA: NEGATIVE
GC Probe RNA: NEGATIVE

## 2012-03-24 LAB — HSV(HERPES SMPLX)ABS-I+II(IGG+IGM)-BLD
HSV 1 Glycoprotein G Ab, IgG: 1.67 IV — ABNORMAL HIGH
HSV 2 Glycoprotein G Ab, IgG: 2.44 IV — ABNORMAL HIGH
Herpes Simplex Vrs I&II-IgM Ab (EIA): 0.39 {index}

## 2012-03-24 LAB — PROCALCITONIN: Procalcitonin: 0.16 ng/mL

## 2012-03-24 LAB — ENDOMYSIAL IGA ANTIBODY: Endomysial IgA Autoabs: NEGATIVE

## 2012-03-24 LAB — FERRITIN: Ferritin: 1317 ng/mL — ABNORMAL HIGH (ref 22–322)

## 2012-03-24 MED ORDER — CHLORHEXIDINE GLUCONATE 0.12 % MT SOLN: 15.0000 mL | Freq: Two times a day (BID) | OROMUCOSAL | Status: DC

## 2012-03-24 MED ORDER — NOREPINEPHRINE BITARTRATE 1 MG/ML IJ SOLN
2.0000 ug/min | INTRAVENOUS | Status: DC
  Administered 2012-03-24 (×2): 5 ug/min via INTRAVENOUS
  Filled 2012-03-24 (×2): qty 4

## 2012-03-24 MED ORDER — BIOTENE DRY MOUTH MT LIQD: 15.0000 mL | Freq: Two times a day (BID) | OROMUCOSAL | Status: DC

## 2012-03-24 MED ORDER — BIOTENE DRY MOUTH MT LIQD
15.0000 mL | Freq: Two times a day (BID) | OROMUCOSAL | Status: DC
  Administered 2012-03-24 – 2012-04-02 (×15): 15 mL via OROMUCOSAL

## 2012-03-24 MED ORDER — PNEUMOCOCCAL VAC POLYVALENT 25 MCG/0.5ML IJ INJ
0.5000 mL | INJECTION | INTRAMUSCULAR | Status: AC
  Administered 2012-03-25: 0.5 mL via INTRAMUSCULAR
  Filled 2012-03-24: qty 0.5

## 2012-03-24 MED ORDER — FENTANYL CITRATE 0.05 MG/ML IJ SOLN
50.0000 ug | INTRAMUSCULAR | Status: DC | PRN
  Administered 2012-03-24 (×4): 100 ug via INTRAVENOUS
  Filled 2012-03-24 (×4): qty 2

## 2012-03-24 MED ORDER — SODIUM CHLORIDE 0.9 % IV SOLN: INTRAVENOUS | Status: DC | PRN

## 2012-03-24 MED ORDER — INFLUENZA VIRUS VACC SPLIT PF IM SUSP
0.5000 mL | INTRAMUSCULAR | Status: AC
  Administered 2012-03-25: 0.5 mL via INTRAMUSCULAR
  Filled 2012-03-24: qty 0.5

## 2012-03-24 MED ORDER — FENTANYL CITRATE 0.05 MG/ML IJ SOLN
50.0000 ug | INTRAMUSCULAR | Status: DC | PRN
  Administered 2012-03-24 – 2012-03-30 (×48): 100 ug via INTRAVENOUS
  Filled 2012-03-24 (×49): qty 2

## 2012-03-24 MED ORDER — SODIUM CHLORIDE 0.9 % IV SOLN: INTRAVENOUS | Status: DC

## 2012-03-24 NOTE — Consult Note (Signed)
[facility]    Date of Admission:  03/22/2012  Date of Consult:  03/24/2012  Reason for Consult: Perirectal abscesses concern initially for necrotizing fasciitis also newly diagnosed HIV Referring Physician: Dr. Sung Amabile   HPI: Leon Nichols is an 46 y.o. male the past medical history significant for hypertension diverticulosis prior episode of shingles, perirectal abscesses who was admitted to the hospital on December 18. Apparently in addition to his perirectal abscesses and recent zoster infection he has had lesions on his penis and superior area there were initially thought to be herpetic with superimposed bacterial infection. In fact developed a suprapubic boil which he was treating with pork salt and Vaseline. He was given acyclovir and Keflex and discharged home from the emergency department. He then developed worsening perirectal suprapubic skin infections with discharge and pain that became severe with worsening discharge. He babbled work for 3 months prior due to the pain associated with these soft tissue lesions. He had multiple draining wounds in his perirectal area and perineum and also around his pubis and scrotum with foul smelling drainage.   He ultimately again came in on the 18th and was admitted to using service and initially was dizzy and with low blood pressures. CT done was read as:  IMPRESSION:  Multiple gas containing tracks within the posterior subcutaneous  tissues, perineum, and suprapubic region. Centrally, the tracks  extend perianal and to abut the coccyx. If acute, conditions such  as necrotizing fasciitis should be excluded. If chronic, recommend  correlation with the patient's immune status for inflammatory  conditions such as Crohn disease.  Inguinal lymphadenopathy may be reactive. Lymphoma should be  excluded.  Pericolonic fat stranding and wall thickening involving the  descending colon. Prominent pericolonic lymph nodes.  Differential  for this appearance also includes inflammatory bowel disease and  lymphoma. Correlate with colonoscopy if not yet performed.    Started on broad-spectrum antibiotics in the form of vancomycin Zosyn and clindamycin and concern for possible necrotizing fasciitis. He was brought to operating room by Dr. Dillard Essex with central, surgery performed I and D. of perirectal abscesses. Not finding evidence of necrotizing infection but found large perirectal fistulas. Patient is likely going to need a diverting colostomy to control his pathology. He currently is hemodynamically stabilized and cultures are cooking.  In the interval an HIV antibody was done and this is now positive.  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 He is currently separated from his wife who was in the room early in the morning when I saw him but it was absent when I return to discuss his HIV status. His wife is unaware of patient's HIV diagnosis. The patient does not want his HIV status disclosed by the health care staff to his wife or to any other family members. Certainly we'll observe this competency out of. Certainly the patient self will need to inform his wife of his status himself I informed him of this.  Do this however at one time which he is comfortable disclosing his status to the patient's wife. Certainly it will be critical to have her tested for HIV so that she get into care as well if she is positive.   I spent greater than 60 minutes with the patient including greater than 50% of time in face to face counsel of the patient and in coordination of their care.  The patient appears to have Medicaid but he is  not 100% sure that he has this as a pair source. He does not eat any to get emergency ADAP Application process. I discussed him with my bridge counselor Sharol Roussel and have consult to case management to make sure the patient does in  fact have Medicaid. If he needs ADAP he will need CD4, viral load and signed statemeent that he is not employed.   Past Medical History  Diagnosis Date  . Hypertension   . Diverticulosis   . Hemorrhoids   . Perirectal abscess   . Colitis   . History of shingles     Past Surgical History  Procedure Date  . Colonoscopy 08/31/2011    Procedure: COLONOSCOPY;  Surgeon: Hilarie Fredrickson, MD;  Location: [facility] ENDOSCOPY;  Service: Endoscopy;  Laterality: N/A;  ergies:   No Known Allergies   Medications: I have reviewed patients current medications as documented in Epic Anti-infectives     Start     Dose/Rate Route Frequency Ordered Stop   03/23/12 1200   clindamycin (CLEOCIN) IVPB 900 mg  Status:  Discontinued        900 mg 100 mL/hr over 30 Minutes Intravenous 4 times per day 03/23/12 0946 03/24/12 1030   03/23/12 1030  piperacillin-tazobactam (ZOSYN) IVPB 3.375 g       3.375 g 12.5 mL/hr over 240 Minutes Intravenous 3 times per day 03/23/12 0922     03/23/12 1030   vancomycin (VANCOCIN) IVPB 1000 mg/200 mL premix        1,000 mg 200 mL/hr over 60 Minutes Intravenous Every 8 hours 03/23/12 0922     03/23/12 0000   acyclovir (ZOVIRAX) tablet 400 mg  Status:  Discontinued        400 mg Oral 5 times daily 03/22/12 2359 03/23/12 1039   03/22/12 1845   vancomycin (VANCOCIN) IVPB 1000 mg/200 mL premix        1,000 mg 200 mL/hr over 60 Minutes Intravenous  Once 03/22/12 1836 03/22/12 2237   03/22/12 1845  piperacillin-tazobactam (ZOSYN) IVPB 3.375 g       3.375 g 12.5 mL/hr over 240 Minutes Intravenous  Once 03/22/12 1836 03/23/12 0015          Social History:  reports that he has been smoking Cigarettes.  He has a 3.75 pack-year smoking history. He does not have any smokeless tobacco history on file. He reports that he drinks alcohol. He reports that he uses illicit drugs (Marijuana and Cocaine).  Family History  Problem Relation Age of Onset  . Hypertension Mother   . Diabetes  Mother     As in HPI and primary teams notes otherwise 12 point review of systems is negative  Blood pressure 93/61, pulse 90, temperature 96.8 F (36 C), temperature source Oral, resp. rate 24, height 6' 0.84" (1.85 m), weight 208 lb 8.9 oz (94.6 kg), SpO2 100.00%. General: Alert and awake, oriented x3, not in any acute distress. HEENT: anicteric sclera, pupils reactive to light and accommodation, EOMI, oropharynx clear and without exudate CVS regular rate, normal r,  no murmur rubs or gallops Chest: clear to auscultation bilaterally, no wheezing, rales or rhonchi Abdomen: soft  nondistended, normal bowel sounds GU: purulent wound visible on the left perirectal area not examined Extremities: no  clubbing or edema noted bilaterally Skin: no rashes Neuro: nonfocal, strength and sensation intact   Results for orders placed during the hospital encounter of 03/22/12 (from the past 48 hour(s))  CBC WITH DIFFERENTIAL  Status: Abnormal   Collection Time   03/22/12  6:56 PM      Component Value Range Comment   WBC 8.8  4.0 - 10.5 K/uL    RBC 2.94 (*) 4.22 - 5.81 MIL/uL    Hemoglobin 7.4 (*) 13.0 - 17.0 g/dL    HCT 16.1 (*) 09.6 - 52.0 %    MCV 78.9  78.0 - 100.0 fL    MCH 25.2 (*) 26.0 - 34.0 pg    MCHC 31.9  30.0 - 36.0 g/dL    RDW 04.5 (*) 40.9 - 15.5 %    Platelets 344  150 - 400 K/uL    Neutrophils Relative 81 (*) 43 - 77 %    Lymphocytes Relative 8 (*) 12 - 46 %    Monocytes Relative 9  3 - 12 %    Eosinophils Relative 2  0 - 5 %    Basophils Relative 0  0 - 1 %    Neutro Abs 7.1  1.7 - 7.7 K/uL    Lymphs Abs 0.7  0.7 - 4.0 K/uL    Monocytes Absolute 0.8  0.1 - 1.0 K/uL    Eosinophils Absolute 0.2  0.0 - 0.7 K/uL    Basophils Absolute 0.0  0.0 - 0.1 K/uL    RBC Morphology POLYCHROMASIA PRESENT      WBC Morphology ATYPICAL LYMPHOCYTES   TOXIC GRANULATION   Smear Review LARGE PLATELETS PRESENT     COMPREHENSIVE METABOLIC PANEL     Status: Abnormal   Collection Time    03/22/12  6:56 PM      Component Value Range Comment   Sodium 131 (*) 135 - 145 mEq/L    Potassium 4.0  3.5 - 5.1 mEq/L    Chloride 96  96 - 112 mEq/L    CO2 23  19 - 32 mEq/L    Glucose, Bld 133 (*) 70 - 99 mg/dL    BUN 31 (*) 6 - 23 mg/dL    Creatinine, Ser 8.11  0.50 - 1.35 mg/dL    Calcium 91.4  8.4 - 10.5 mg/dL    Total Protein 8.6 (*) 6.0 - 8.3 g/dL    Albumin 2.5 (*) 3.5 - 5.2 g/dL    AST 13  0 - 37 U/L    ALT 6  0 - 53 U/L    Alkaline Phosphatase 66  39 - 117 U/L    Total Bilirubin 0.3  0.3 - 1.2 mg/dL    GFR calc non Af Amer 71 (*) >90 mL/min    GFR calc Af Amer 83 (*) >90 mL/min   RETICULOCYTES     Status: Abnormal   Collection Time   03/22/12  6:56 PM      Component Value Range Comment   Retic Ct Pct 1.0  0.4 - 3.1 %    RBC. 2.97 (*) 4.22 - 5.81 MIL/uL    Retic Count, Manual 29.7  19.0 - 186.0 K/uL   LACTIC ACID, PLASMA     Status: Normal   Collection Time   03/22/12  6:59 PM      Component Value Range Comment   Lactic Acid, Venous 1.5  0.5 - 2.2 mmol/L   CULTURE, BLOOD (ROUTINE X 2)     Status: Normal (Preliminary result)   Collection Time   03/22/12  7:10 PM      Component Value Range Comment   Specimen Description BLOOD ARM LEFT      Special Requests  Value: BOTTLES DRAWN AEROBIC AND ANAEROBIC AERO 10CC,ANAE 5CC   Culture  Setup Time 03/23/2012 04:02      Culture        Value:        BLOOD CULTURE RECEIVED NO GROWTH TO DATE CULTURE WILL BE HELD FOR 5 DAYS BEFORE ISSUING A FINAL NEGATIVE REPORT   Report Status PENDING     CULTURE, BLOOD (ROUTINE X 2)     Status: Normal (Preliminary result)   Collection Time   03/22/12  7:27 PM      Component Value Range Comment   Specimen Description BLOOD ARM LEFT      Special Requests BOTTLES DRAWN AEROBIC AND ANAEROBIC 10CC      Culture  Setup Time 03/23/2012 04:02      Culture        Value:        BLOOD CULTURE RECEIVED NO GROWTH TO DATE CULTURE WILL BE HELD FOR 5 DAYS BEFORE ISSUING A FINAL NEGATIVE REPORT    Report Status PENDING     TYPE AND SCREEN     Status: Normal   Collection Time   03/22/12  8:33 PM      Component Value Range Comment   ABO/RH(D) O POS      Antibody Screen NEG      Sample Expiration 03/25/2012      Unit Number [id number]      Blood Component Type RED CELLS,LR      Unit division 00      Status of Unit ISSUED,FINAL      Transfusion Status OK TO TRANSFUSE      Crossmatch Result Compatible      Unit Number [id number]      Blood Component Type RED CELLS,LR      Unit division 00      Status of Unit ISSUED,FINAL      Transfusion Status OK TO TRANSFUSE      Crossmatch Result Compatible      Unit Number [id number]      Blood Component Type RBC LR PHER1      Unit division 00      Status of Unit ISSUED,FINAL      Transfusion Status OK TO TRANSFUSE      Crossmatch Result Compatible      Unit Number [id number]      Blood Component Type RED CELLS,LR      Unit division 00      Status of Unit ISSUED,FINAL      Transfusion Status OK TO TRANSFUSE      Crossmatch Result Compatible     ABO/RH     Status: Normal   Collection Time   03/22/12  8:33 PM      Component Value Range Comment   ABO/RH(D) O POS     VITAMIN B12     Status: Normal   Collection Time   03/22/12  8:37 PM      Component Value Range Comment   Vitamin B-12 448  211 - 911 pg/mL   IRON AND TIBC     Status: Abnormal   Collection Time   03/22/12  8:37 PM      Component Value Range Comment   Iron 11 (*) 42 - 135 ug/dL    TIBC 725 (*) 366 - 440 ug/dL    Saturation Ratios 6 (*) 20 - 55 %    UIBC 175  125 - 400 ug/dL   FERRITIN     Status: Abnormal  Collection Time   03/22/12  8:37 PM      Component Value Range Comment   Ferritin 1317 (*) 22 - 322 ng/mL   PREPARE RBC (CROSSMATCH)     Status: Normal   Collection Time   03/22/12 11:44 PM      Component Value Range Comment   Order Confirmation ORDER PROCESSED BY BLOOD BANK     HIV ANTIBODY (ROUTINE TESTING)     Status: Abnormal   Collection  Time   03/23/12 12:02 AM      Component Value Range Comment   HIV Reactive (*) NON REACTIVE   RPR     Status: Normal   Collection Time   03/23/12 12:02 AM      Component Value Range Comment   RPR NON REACTIVE  NON REACTIVE   HSV(HERPES SMPLX)ABS-I+II(IGG+IGM)-BLD     Status: Normal (Preliminary result)   Collection Time   03/23/12 12:02 AM      Component Value Range Comment   Herpes Simplex Vrs I&II-IgM Ab (EIA) 0.39      HSV 1 Glycoprotein G Ab, IgG PENDING      HSV 2 Glycoprotein G Ab, IgG PENDING     TISSUE TRANSGLUTAMINASE, IGA     Status: Normal   Collection Time   03/23/12  1:20 AM      Component Value Range Comment   Tissue Transglutaminase Ab, IgA 6.9  <20 U/mL   WOUND CULTURE     Status: Normal (Preliminary result)   Collection Time   03/23/12  2:13 AM      Component Value Range Comment   Specimen Description WOUND PENIS      Special Requests Normal      Gram Stain        Value: NO WBC SEEN     FEW SQUAMOUS EPITHELIAL CELLS PRESENT     FEW GRAM NEGATIVE COCCOBACILLI     FEW GRAM POSITIVE COCCI IN PAIRS     RARE GRAM NEGATIVE RODS   Culture Culture reincubated for better growth      Report Status PENDING     EYE CULTURE     Status: Normal (Preliminary result)   Collection Time   03/23/12  2:13 AM      Component Value Range Comment   Specimen Description EYE      Special Requests Normal      Culture NO GROWTH 1 DAY      Report Status PENDING     URINALYSIS, ROUTINE W REFLEX MICROSCOPIC     Status: Abnormal   Collection Time   03/23/12  2:14 AM      Component Value Range Comment   Color, Urine YELLOW  YELLOW    APPearance CLOUDY (*) CLEAR    Specific Gravity, Urine 1.020  1.005 - 1.030    pH 5.0  5.0 - 8.0    Glucose, UA NEGATIVE  NEGATIVE mg/dL    Hgb urine dipstick NEGATIVE  NEGATIVE    Bilirubin Urine NEGATIVE  NEGATIVE    Ketones, ur NEGATIVE  NEGATIVE mg/dL    Protein, ur NEGATIVE  NEGATIVE mg/dL    Urobilinogen, UA 1.0  0.0 - 1.0 mg/dL    Nitrite  NEGATIVE  NEGATIVE    Leukocytes, UA MODERATE (*) NEGATIVE   CREATININE, URINE, RANDOM     Status: Normal   Collection Time   03/23/12  2:14 AM      Component Value Range Comment   Creatinine, Urine 85.09  SODIUM, URINE, RANDOM     Status: Normal   Collection Time   03/23/12  2:14 AM      Component Value Range Comment   Sodium, Ur 111     OSMOLALITY, URINE     Status: Normal   Collection Time   03/23/12  2:14 AM      Component Value Range Comment   Osmolality, Ur 562  390 - 1090 mOsm/kg   URINE MICROSCOPIC-ADD ON     Status: Abnormal   Collection Time   03/23/12  2:14 AM      Component Value Range Comment   WBC, UA 3-6  <3 WBC/hpf    RBC / HPF 0-2  <3 RBC/hpf    Bacteria, UA RARE  RARE    Casts HYALINE CASTS (*) NEGATIVE    Crystals CA OXALATE CRYSTALS (*) NEGATIVE TRIPLE PHOSPHATE CRYSTALS   Urine-Other MUCOUS PRESENT     URINE RAPID DRUG SCREEN (HOSP PERFORMED)     Status: Abnormal   Collection Time   03/23/12  7:37 AM      Component Value Range Comment   Opiates POSITIVE (*) NONE DETECTED    Cocaine NONE DETECTED  NONE DETECTED    Benzodiazepines NONE DETECTED  NONE DETECTED    Amphetamines NONE DETECTED  NONE DETECTED    Tetrahydrocannabinol NONE DETECTED  NONE DETECTED    Barbiturates NONE DETECTED  NONE DETECTED   CBC     Status: Abnormal   Collection Time   03/23/12 10:39 AM      Component Value Range Comment   WBC 9.7  4.0 - 10.5 K/uL    RBC 2.40 (*) 4.22 - 5.81 MIL/uL    Hemoglobin 6.4 (*) 13.0 - 17.0 g/dL CRITICAL VALUE NOTED.  VALUE IS CONSISTENT WITH PREVIOUSLY REPORTED AND CALLED VALUE.   HCT 19.3 (*) 39.0 - 52.0 %    MCV 80.4  78.0 - 100.0 fL    MCH 26.7  26.0 - 34.0 pg    MCHC 33.2  30.0 - 36.0 g/dL    RDW 16.1  09.6 - 04.5 %    Platelets 270  150 - 400 K/uL   BASIC METABOLIC PANEL     Status: Abnormal   Collection Time   03/23/12 10:39 AM      Component Value Range Comment   Sodium 134 (*) 135 - 145 mEq/L    Potassium 3.6  3.5 - 5.1 mEq/L     Chloride 105  96 - 112 mEq/L    CO2 20  19 - 32 mEq/L    Glucose, Bld 103 (*) 70 - 99 mg/dL    BUN 15  6 - 23 mg/dL    Creatinine, Ser 4.09  0.50 - 1.35 mg/dL    Calcium 8.5  8.4 - 81.1 mg/dL    GFR calc non Af Amer >90  >90 mL/min    GFR calc Af Amer >90  >90 mL/min   HEMOGLOBIN A1C     Status: Abnormal   Collection Time   03/23/12 10:39 AM      Component Value Range Comment   Hemoglobin A1C 6.2 (*) <5.7 %    Mean Plasma Glucose 131 (*) <117 mg/dL   CBC WITH DIFFERENTIAL     Status: Abnormal   Collection Time   03/23/12 10:39 AM      Component Value Range Comment   WBC 9.8  4.0 - 10.5 K/uL    RBC 2.44 (*) 4.22 - 5.81 MIL/uL  Hemoglobin 6.4 (*) 13.0 - 17.0 g/dL    HCT 40.9 (*) 81.1 - 52.0 %    MCV 80.7  78.0 - 100.0 fL    MCH 26.2  26.0 - 34.0 pg    MCHC 32.5  30.0 - 36.0 g/dL    RDW 91.4 (*) 78.2 - 15.5 %    Platelets 293  150 - 400 K/uL    Neutrophils Relative 79 (*) 43 - 77 %    Lymphocytes Relative 12  12 - 46 %    Monocytes Relative 8  3 - 12 %    Eosinophils Relative 1  0 - 5 %    Basophils Relative 0  0 - 1 %    Neutro Abs 7.7  1.7 - 7.7 K/uL    Lymphs Abs 1.2  0.7 - 4.0 K/uL    Monocytes Absolute 0.8  0.1 - 1.0 K/uL    Eosinophils Absolute 0.1  0.0 - 0.7 K/uL    Basophils Absolute 0.0  0.0 - 0.1 K/uL    RBC Morphology TARGET CELLS     COMPREHENSIVE METABOLIC PANEL     Status: Abnormal   Collection Time   03/23/12 10:39 AM      Component Value Range Comment   Sodium 135  135 - 145 mEq/L    Potassium 3.7  3.5 - 5.1 mEq/L    Chloride 105  96 - 112 mEq/L    CO2 20  19 - 32 mEq/L    Glucose, Bld 101 (*) 70 - 99 mg/dL    BUN 15  6 - 23 mg/dL    Creatinine, Ser 9.56  0.50 - 1.35 mg/dL    Calcium 8.4  8.4 - 21.3 mg/dL    Total Protein 6.3  6.0 - 8.3 g/dL    Albumin 1.8 (*) 3.5 - 5.2 g/dL    AST 10  0 - 37 U/L    ALT <5  0 - 53 U/L RESULT REPEATED AND VERIFIED   Alkaline Phosphatase 47  39 - 117 U/L    Total Bilirubin 0.5  0.3 - 1.2 mg/dL    GFR calc non Af  Amer >90  >90 mL/min    GFR calc Af Amer >90  >90 mL/min   LACTIC ACID, PLASMA     Status: Normal   Collection Time   03/23/12 10:39 AM      Component Value Range Comment   Lactic Acid, Venous 0.6  0.5 - 2.2 mmol/L   APTT     Status: Abnormal   Collection Time   03/23/12 10:39 AM      Component Value Range Comment   aPTT 38 (*) 24 - 37 seconds   PROTIME-INR     Status: Abnormal   Collection Time   03/23/12 10:39 AM      Component Value Range Comment   Prothrombin Time 16.4 (*) 11.6 - 15.2 seconds    INR 1.35  0.00 - 1.49   PROCALCITONIN     Status: Normal   Collection Time   03/23/12 10:40 AM      Component Value Range Comment   Procalcitonin <0.10     PREPARE RBC (CROSSMATCH)     Status: Normal   Collection Time   03/23/12 11:30 AM      Component Value Range Comment   Order Confirmation ORDER PROCESSED BY BLOOD BANK     POCT I-STAT 7, (EG7 V)     Status: Abnormal   Collection Time  03/23/12  1:16 PM      Component Value Range Comment   pH, Ven 7.328 (*) 7.250 - 7.300    pCO2, Ven 39.6 (*) 45.0 - 50.0 mmHg    pO2, Ven 188.0 (*) 30.0 - 45.0 mmHg    Bicarbonate 20.6  20.0 - 24.0 mEq/L    TCO2 22  0 - 100 mmol/L    O2 Saturation 100.0      Acid-base deficit 5.0 (*) 0.0 - 2.0 mmol/L    Sodium 137  135 - 145 mEq/L    Potassium 3.7  3.5 - 5.1 mEq/L    Calcium, Ion 1.27 (*) 1.12 - 1.23 mmol/L    HCT 20.0 (*) 39.0 - 52.0 %    Hemoglobin 6.8 (*) 13.0 - 17.0 g/dL    Patient temperature 37.9 C      Sample type VENOUS      Comment NOTIFIED PHYSICIAN     MRSA PCR SCREENING     Status: Normal   Collection Time   03/23/12  4:50 PM      Component Value Range Comment   MRSA by PCR NEGATIVE  NEGATIVE   CBC     Status: Abnormal   Collection Time   03/23/12  7:30 PM      Component Value Range Comment   WBC 8.7  4.0 - 10.5 K/uL    RBC 2.58 (*) 4.22 - 5.81 MIL/uL    Hemoglobin 7.0 (*) 13.0 - 17.0 g/dL    HCT 16.1 (*) 09.6 - 52.0 %    MCV 81.0  78.0 - 100.0 fL    MCH 27.1  26.0  - 34.0 pg    MCHC 33.5  30.0 - 36.0 g/dL    RDW 04.5  40.9 - 81.1 %    Platelets 229  150 - 400 K/uL   GC/CHLAMYDIA PROBE AMP     Status: Normal   Collection Time   03/23/12  7:33 PM      Component Value Range Comment   CT Probe RNA NEGATIVE  NEGATIVE    GC Probe RNA NEGATIVE  NEGATIVE   LACTIC ACID, PLASMA     Status: Normal   Collection Time   03/23/12  9:10 PM      Component Value Range Comment   Lactic Acid, Venous 0.7  0.5 - 2.2 mmol/L   CBC     Status: Abnormal   Collection Time   03/24/12  5:00 AM      Component Value Range Comment   WBC 7.9  4.0 - 10.5 K/uL    RBC 2.85 (*) 4.22 - 5.81 MIL/uL    Hemoglobin 7.6 (*) 13.0 - 17.0 g/dL    HCT 91.4 (*) 78.2 - 52.0 %    MCV 82.5  78.0 - 100.0 fL    MCH 26.7  26.0 - 34.0 pg    MCHC 32.3  30.0 - 36.0 g/dL    RDW 95.6  21.3 - 08.6 %    Platelets 228  150 - 400 K/uL   COMPREHENSIVE METABOLIC PANEL     Status: Abnormal   Collection Time   03/24/12  5:00 AM      Component Value Range Comment   Sodium 130 (*) 135 - 145 mEq/L    Potassium 3.7  3.5 - 5.1 mEq/L    Chloride 101  96 - 112 mEq/L    CO2 21  19 - 32 mEq/L    Glucose, Bld 86  70 - 99 mg/dL  BUN 11  6 - 23 mg/dL    Creatinine, Ser 1.61  0.50 - 1.35 mg/dL    Calcium 8.4  8.4 - 09.6 mg/dL    Total Protein 6.1  6.0 - 8.3 g/dL    Albumin 1.7 (*) 3.5 - 5.2 g/dL    AST 13  0 - 37 U/L    ALT <5  0 - 53 U/L    Alkaline Phosphatase 47  39 - 117 U/L    Total Bilirubin 1.1  0.3 - 1.2 mg/dL    GFR calc non Af Amer >90  >90 mL/min    GFR calc Af Amer >90  >90 mL/min   LACTIC ACID, PLASMA     Status: Normal   Collection Time   03/24/12  5:00 AM      Component Value Range Comment   Lactic Acid, Venous 0.7  0.5 - 2.2 mmol/L   PROCALCITONIN     Status: Normal   Collection Time   03/24/12  5:00 AM      Component Value Range Comment   Procalcitonin 0.16         Component Value Date/Time   SDES WOUND PENIS 03/23/2012 0213   SDES EYE 03/23/2012 0213   SPECREQUEST Normal  03/23/2012 0213   SPECREQUEST Normal 03/23/2012 0213   CULT Culture reincubated for better growth 03/23/2012 0213   CULT NO GROWTH 1 DAY 03/23/2012 0213   REPTSTATUS PENDING 03/23/2012 0213   REPTSTATUS PENDING 03/23/2012 0213   Ct Pelvis W Contrast  03/23/2012  *RADIOLOGY REPORT*  Clinical Data:  Suprapubic/perianal region wounds.  CT PELVIS WITH CONTRAST  Technique:  Multidetector CT imaging of the pelvis was performed using the standard protocol following the bolus administration of intravenous contrast.  Contrast: OMNIPAQUE IOHEXOL 300 MG/ML  SOLN  Comparison:  08/29/2011  Findings:  Visualized intrapelvic contents show pericolonic fat stranding at the descending colon with prominent adjacent lymph nodes up to 7 mm short axis.  The previously described cutaneous ulcer and fissure along the left side of the back, extending perianal and toward the coccyx is again noted, with interval increased associated gas.  The process is in communication with tracks of gas and soft tissue coursing along the posterior subcutaneous fat and anteriorly to involve the perineum and suprapubic region. There is no associated loculated/drainable fluid collection.  Prominent inguinal lymph nodes, measuring up to 1.5 cm on the left.  No acute osseous finding.  IMPRESSION: Multiple gas containing tracks within the posterior subcutaneous tissues, perineum, and suprapubic region.  Centrally, the tracks extend perianal and to abut the coccyx. If acute, conditions such as necrotizing fasciitis should be excluded.  If chronic, recommend correlation with the patient's immune status for inflammatory conditions such as Crohn disease.  Inguinal lymphadenopathy may be reactive.  Lymphoma should be excluded.  Pericolonic fat stranding and wall thickening involving the descending colon.  Prominent pericolonic lymph nodes.  Differential for this appearance also includes inflammatory bowel disease and lymphoma.  Correlate with  colonoscopy if not yet performed.  Discussed via telephone with the admitting service at 01:40 a.m. on 03/23/2012.  Reportedly, this is a chronic process that is progressing, and the patient has had a recent colonoscopy.   Original Report Authenticated By: Jearld Lesch, M.D.    Dg Chest Port 1 View  03/24/2012  *RADIOLOGY REPORT*  Clinical Data: Shortness of breath, evaluate endotracheal tube position  PORTABLE CHEST - 1 VIEW  Comparison: Portable chest x-ray of 03/23/2012  Findings:  The lungs are not quite as well aerated with increase in basilar atelectasis.  No endotracheal tube is visible.  The left central venous line is unchanged in position.  Heart size is stable.  IMPRESSION: No endotracheal tube is seen.  The lungs are poorly aerated with slight increase in basilar atelectasis.   Original Report Authenticated By: Dwyane Dee, M.D.    Dg Chest Port 1 View  03/23/2012  *RADIOLOGY REPORT*  Clinical Data: Central line placement.  PORTABLE CHEST - 1 VIEW  Comparison: Earlier film, same date.  Findings: The left subclavian central venous catheter tip is in the mid SVC at the level of the carina.  Low lung volumes with vascular crowding and atelectasis.  No pneumothorax.  IMPRESSION:  1.  Left subclavian central venous catheter tip is in the mid SVC. No complicating features. 2.  Low lung volumes with vascular crowding and atelectasis.   Original Report Authenticated By: Rudie Meyer, M.D.    Dg Chest Port 1 View  03/23/2012  *RADIOLOGY REPORT*  Clinical Data: Preoperative radiograph for wound debridement.  PORTABLE CHEST - 1 VIEW  Comparison: 09/23/2011  Findings: Mild left lung base opacity, likely scarring or atelectasis.  Otherwise, no confluent airspace opacity, pleural effusion, or pneumothorax.  Cardiomediastinal contours within normal range.  No acute osseous finding.  IMPRESSION: Mild left lung base opacity, likely scarring or atelectasis.   Original Report Authenticated By: Jearld Lesch, M.D.      Recent Results (from the past 720 hour(s))  CULTURE, BLOOD (ROUTINE X 2)     Status: Normal (Preliminary result)   Collection Time   03/22/12  7:10 PM      Component Value Range Status Comment   Specimen Description BLOOD ARM LEFT   Final    Special Requests     Final    Value: BOTTLES DRAWN AEROBIC AND ANAEROBIC AERO 10CC,ANAE 5CC   Culture  Setup Time 03/23/2012 04:02   Final    Culture     Final    Value:        BLOOD CULTURE RECEIVED NO GROWTH TO DATE CULTURE WILL BE HELD FOR 5 DAYS BEFORE ISSUING A FINAL NEGATIVE REPORT   Report Status PENDING   Incomplete   CULTURE, BLOOD (ROUTINE X 2)     Status: Normal (Preliminary result)   Collection Time   03/22/12  7:27 PM      Component Value Range Status Comment   Specimen Description BLOOD ARM LEFT   Final    Special Requests BOTTLES DRAWN AEROBIC AND ANAEROBIC 10CC   Final    Culture  Setup Time 03/23/2012 04:02   Final    Culture     Final    Value:        BLOOD CULTURE RECEIVED NO GROWTH TO DATE CULTURE WILL BE HELD FOR 5 DAYS BEFORE ISSUING A FINAL NEGATIVE REPORT   Report Status PENDING   Incomplete   WOUND CULTURE     Status: Normal (Preliminary result)   Collection Time   03/23/12  2:13 AM      Component Value Range Status Comment   Specimen Description WOUND PENIS   Final    Special Requests Normal   Final    Gram Stain     Final    Value: NO WBC SEEN     FEW SQUAMOUS EPITHELIAL CELLS PRESENT     FEW GRAM NEGATIVE COCCOBACILLI     FEW GRAM POSITIVE COCCI IN PAIRS  RARE GRAM NEGATIVE RODS   Culture Culture reincubated for better growth   Final    Report Status PENDING   Incomplete   EYE CULTURE     Status: Normal (Preliminary result)   Collection Time   03/23/12  2:13 AM      Component Value Range Status Comment   Specimen Description EYE   Final    Special Requests Normal   Final    Culture NO GROWTH 1 DAY   Final    Report Status PENDING   Incomplete   MRSA PCR SCREENING     Status: Normal    Collection Time   03/23/12  4:50 PM      Component Value Range Status Comment   MRSA by PCR NEGATIVE  NEGATIVE Final   GC/CHLAMYDIA PROBE AMP     Status: Normal   Collection Time   03/23/12  7:33 PM      Component Value Range Status Comment   CT Probe RNA NEGATIVE  NEGATIVE Final    GC Probe RNA NEGATIVE  NEGATIVE Final      Impression/Recommendation  53 ear old [place] American man with newly diagnosed HIV likely AIDS with perirectal abscesses and rectal fistulas  #1 perirectal abscessesdisease with rectal fistulas: --Agree with DC and clindamycin as the patient does not appear to have a toxin mediated pathology going on --Vancomycin and Zosyn are reasonable for now while we await culture data --Patient appears to need a diverting colostomy to control his pathology.  #2 HIV and likely AIDS: --Will order a CD4 count and viral load with genotype --We need clarification if he does in fact have active Medicaid which should pay for his medicines. If that is the case I would then want to start him on antiretroviral therapy as soon as possible and continue this and an underactive fashion. I discussed some of the first line antiretroviral therapy for that today but I did discuss these further with him.  He does not have Medicaid he will early need emergency ADAP Application.  Dr. Cathie Olden on over the weekend.   Thank you so much for this interesting consult  [facility] [phone] (pager) [phone] (office) 03/24/2012, 12:14 PM  Paulette Blanch Dam 03/24/2012, 12:14 PM

## 2012-03-24 NOTE — Progress Notes (Signed)
Patient ID: Leon Nichols, male   DOB: 1965-04-12, 46 y.o.   MRN: [id number] 1 Day Post-Op  Subjective: Feels better than before surgery, sore but less pain  Objective: Vital signs in last 24 hours: Temp:  [96.8 F (36 C)-101.3 F (38.5 C)] 96.8 F (36 C) (12/20 0755) Pulse Rate:  [66-111] 73  (12/20 0645) Resp:  [9-29] 20  (12/20 0645) BP: (56-134)/(38-96) 87/59 mmHg (12/20 0600) SpO2:  [87 %-100 %] 100 % (12/20 0645) Arterial Line BP: (76-121)/(46-67) 92/67 mmHg (12/20 0645) Weight:  [208 lb 8.9 oz (94.6 kg)-224 lb 3.3 oz (101.7 kg)] 208 lb 8.9 oz (94.6 kg) (12/20 0500) Last BM Date: 03/21/12  Intake/Output from previous day: 12/19 0701 - 12/20 0700 In: 10533.6 [P.O.:300; I.V.:8721.1; Blood:350; IV Piggyback:1162.5] Out: 1675 [Urine:1675] Intake/Output this shift:    General appearance: alert, cooperative and no distress Incision/Wound: Dressings clean without bleeding or unusual drainage  Lab Results:   Basename 03/24/12 0500 03/23/12 1930  WBC 7.9 8.7  HGB 7.6* 7.0*  HCT 23.5* 20.9*  PLT 228 229   BMET  Basename 03/24/12 0500 03/23/12 1316 03/23/12 1039  NA 130* 137 --  K 3.7 3.7 --  CL 101 -- 105105  CO2 21 -- 2020  GLUCOSE 86 -- 103*101*  BUN 11 -- 1515  CREATININE 0.85 -- 0.820.85  CALCIUM 8.4 -- 8.58.4     Studies/Results: Ct Pelvis W Contrast  03/23/2012  *RADIOLOGY REPORT*  Clinical Data:  Suprapubic/perianal region wounds.  CT PELVIS WITH CONTRAST  Technique:  Multidetector CT imaging of the pelvis was performed using the standard protocol following the bolus administration of intravenous contrast.  Contrast: OMNIPAQUE IOHEXOL 300 MG/ML  SOLN  Comparison:  08/29/2011  Findings:  Visualized intrapelvic contents show pericolonic fat stranding at the descending colon with prominent adjacent lymph nodes up to 7 mm short axis.  The previously described cutaneous ulcer and fissure along the left side of the back, extending perianal and toward the  coccyx is again noted, with interval increased associated gas.  The process is in communication with tracks of gas and soft tissue coursing along the posterior subcutaneous fat and anteriorly to involve the perineum and suprapubic region. There is no associated loculated/drainable fluid collection.  Prominent inguinal lymph nodes, measuring up to 1.5 cm on the left.  No acute osseous finding.  IMPRESSION: Multiple gas containing tracks within the posterior subcutaneous tissues, perineum, and suprapubic region.  Centrally, the tracks extend perianal and to abut the coccyx. If acute, conditions such as necrotizing fasciitis should be excluded.  If chronic, recommend correlation with the patient's immune status for inflammatory conditions such as Crohn disease.  Inguinal lymphadenopathy may be reactive.  Lymphoma should be excluded.  Pericolonic fat stranding and wall thickening involving the descending colon.  Prominent pericolonic lymph nodes.  Differential for this appearance also includes inflammatory bowel disease and lymphoma.  Correlate with colonoscopy if not yet performed.  Discussed via telephone with the admitting service at 01:40 a.m. on 03/23/2012.  Reportedly, this is a chronic process that is progressing, and the patient has had a recent colonoscopy.   Original Report Authenticated By: Jearld Lesch, M.D.    Dg Chest Port 1 View  03/24/2012  *RADIOLOGY REPORT*  Clinical Data: Shortness of breath, evaluate endotracheal tube position  PORTABLE CHEST - 1 VIEW  Comparison: Portable chest x-ray of 03/23/2012  Findings: The lungs are not quite as well aerated with increase in basilar atelectasis.  No endotracheal tube is  visible.  The left central venous line is unchanged in position.  Heart size is stable.  IMPRESSION: No endotracheal tube is seen.  The lungs are poorly aerated with slight increase in basilar atelectasis.   Original Report Authenticated By: Dwyane Dee, M.D.    Dg Chest Port 1  View  03/23/2012  *RADIOLOGY REPORT*  Clinical Data: Central line placement.  PORTABLE CHEST - 1 VIEW  Comparison: Earlier film, same date.  Findings: The left subclavian central venous catheter tip is in the mid SVC at the level of the carina.  Low lung volumes with vascular crowding and atelectasis.  No pneumothorax.  IMPRESSION:  1.  Left subclavian central venous catheter tip is in the mid SVC. No complicating features. 2.  Low lung volumes with vascular crowding and atelectasis.   Original Report Authenticated By: Rudie Meyer, M.D.    Dg Chest Port 1 View  03/23/2012  *RADIOLOGY REPORT*  Clinical Data: Preoperative radiograph for wound debridement.  PORTABLE CHEST - 1 VIEW  Comparison: 09/23/2011  Findings: Mild left lung base opacity, likely scarring or atelectasis.  Otherwise, no confluent airspace opacity, pleural effusion, or pneumothorax.  Cardiomediastinal contours within normal range.  No acute osseous finding.  IMPRESSION: Mild left lung base opacity, likely scarring or atelectasis.   Original Report Authenticated By: Jearld Lesch, M.D.     Anti-infectives: Anti-infectives     Start     Dose/Rate Route Frequency Ordered Stop   03/23/12 1200   clindamycin (CLEOCIN) IVPB 900 mg        900 mg 100 mL/hr over 30 Minutes Intravenous 4 times per day 03/23/12 0946     03/23/12 1030  piperacillin-tazobactam (ZOSYN) IVPB 3.375 g       3.375 g 12.5 mL/hr over 240 Minutes Intravenous 3 times per day 03/23/12 0922     03/23/12 1030   vancomycin (VANCOCIN) IVPB 1000 mg/200 mL premix        1,000 mg 200 mL/hr over 60 Minutes Intravenous Every 8 hours 03/23/12 0922     03/23/12 0000   acyclovir (ZOVIRAX) tablet 400 mg  Status:  Discontinued        400 mg Oral 5 times daily 03/22/12 2359 03/23/12 1039   03/22/12 1845   vancomycin (VANCOCIN) IVPB 1000 mg/200 mL premix        1,000 mg 200 mL/hr over 60 Minutes Intravenous  Once 03/22/12 1836 03/22/12 2237   03/22/12 1845   piperacillin-tazobactam (ZOSYN) IVPB 3.375 g       3.375 g 12.5 mL/hr over 240 Minutes Intravenous  Once 03/22/12 1836 03/23/12 0015          Assessment/Plan: s/p Procedure(s): IRRIGATION AND DEBRIDEMENT PERIRECTAL ABSCESS Multiple perirectal and perineal wounds Large rectal fistulas on exam in OR- I think he will need a diverting colostomy and I discussed this with him I will change peri rectal packing tomorrow   LOS: 2 days    Naira Standiford T 03/24/2012

## 2012-03-24 NOTE — Progress Notes (Signed)
Pt profile: 76M admitted 12/19 with extensive perirectal, perineal abscesses with fistulization, septic shock, anemia. S/P surgical exploration, debridement and irrigation 12/20. Found to be HIV + 12/20  Active problems: Perirectal and perineal abscesses Septic shock - resolved 12/20 New dx of HIV Anemia - received PRBCs 12/19  Lines, Tubes, etc: L [place] CVL 12/19 >>   Microbiology: Wound 12/19 >> mixed organisms >>  Blood 12/19 >>   Antibiotics: (ID service managing) Clinda 12/19 >> 12/20 Vanc 12/19 >>  Zosyn 12/19 >>    Studies/Events: 12/19 CT abd/pelvis: Multiple gas containing tracks within the posterior subcutaneous tissues, perineum, and suprapubic region. Centrally, the tracks extend perianal and to abut the coccyx.   Consults:  CCS ([place]) ID Zenaida Niece [place])   Best Practice: DVT: SCDs SUP: N/I Nutrition: reg diet Glycemic control: N/I Sedation/analgesia: PRN fentanyl    Subj: No new complaints. I have informed him of new finding of HIV + serology  Obj: Filed Vitals:   03/24/12 1430  BP:   Pulse: 79  Temp:   Resp: 21    Gen: NAD HEENT: WNL Neck: no JVD Chest: clear Cardiac: RRR s M [id number]:[id number], NABS, NT Ext: warm, no edema  Perineal wounds dressed - did not examine  BMET    Component Value Date/Time   NA 130* 03/24/2012 0500   K 3.7 03/24/2012 0500   CL 101 03/24/2012 0500   CO2 21 03/24/2012 0500   GLUCOSE 86 03/24/2012 0500   BUN 11 03/24/2012 0500   CREATININE 0.85 03/24/2012 0500   CALCIUM 8.4 03/24/2012 0500   GFRNONAA >90 03/24/2012 0500   GFRAA >90 03/24/2012 0500    CBC    Component Value Date/Time   WBC 7.9 03/24/2012 0500   RBC 2.85* 03/24/2012 0500   HGB 7.6* 03/24/2012 0500   HCT 23.5* 03/24/2012 0500   PLT 228 03/24/2012 0500   MCV 82.5 03/24/2012 0500   MCH 26.7 03/24/2012 0500   MCHC 32.3 03/24/2012 0500   RDW 15.5 03/24/2012 0500   LYMPHSABS 1.2 03/23/2012 1039   MONOABS 0.8 03/23/2012 1039   EOSABS 0.1  03/23/2012 1039   BASOSABS 0.0 03/23/2012 1039    CXR: no new   IMPRESSION: 1) Extensive perirectal and perineal abscesses  S/P irrigation and debridement 12/19  Per CCS, will likely need diverting colostomy 2) Anemia  S/p transfusion 12/19 3) Septic shock resolved 4) New dx of HIV, likely AIDS   PLAN/RECS:  -If remains off pressors through day today, transfer out of ICU and back to IMTS 12/21 -Abx as above. ID to manage -Post op mgmt and timing of colostomy per CCS -Begin diet -Mobilize as tolerated -CD4 and viral load ordered -Discussed with Dr Daiva Eves -I informed pt of new dx of HIV    CCM X 35 mins  Billy Fischer, MD ; [facility] service Mobile [phone].  After 5:30 PM or weekends, call [phone]

## 2012-03-24 NOTE — Progress Notes (Signed)
Call to pharmacy to report Vancomycin level; spoke with james.

## 2012-03-24 NOTE — Progress Notes (Signed)
Utilization Review Completed.Leon Nichols T12/20/2013

## 2012-03-24 NOTE — Progress Notes (Signed)
ANTIBIOTIC CONSULT NOTE - Follow Up  Pharmacy Consult for Vancomycin/Zosyn/Acyclovir Indication: Empiric coverage of scrotal and inguinal abscesses  No Known Allergies  Patient Measurements: Height: 6' 0.83" (185 cm) Weight: 208 lb 8.9 oz (94.6 kg) IBW/kg (Calculated) : 79.52   Vital Signs: Temp: 97.5 F (36.4 C) (12/20 1600) Temp src: Oral (12/20 1600) BP: 99/64 mmHg (12/20 1800) Pulse Rate: 67  (12/20 1800) Intake/Output from previous day: 12/19 0701 - 12/20 0700 In: 10628.6 [P.O.:300; I.V.:8828.6; Blood:350; IV Piggyback:1150] Out: 1675 [Urine:1675] Intake/Output from this shift: Total I/O In: 1525.9 [P.O.:180; I.V.:995.9; IV Piggyback:350] Out: 1515 [Urine:1515]  Labs:  [facility] 03/24/12 0500 03/23/12 1930 03/23/12 1316 03/23/12 1039 03/23/12 0214 03/22/12 1856  WBC 7.9 8.7 -- 9.79.8 -- --  HGB 7.6* 7.0* 6.8* -- -- --  PLT 228 229 -- 270293 -- --  LABCREA -- -- -- -- 85.09 --  CREATININE 0.85 -- -- 0.820.85 -- 1.21   Estimated Creatinine Clearance: 124.7 ml/min (by C-G formula based on Cr of 0.85).  Basename 03/24/12 1730  VANCOTROUGH 22.2*  VANCOPEAK --  Drue Dun --  GENTTROUGH --  GENTPEAK --  GENTRANDOM --  TOBRATROUGH --  TOBRAPEAK --  TOBRARND --  AMIKACINPEAK --  AMIKACINTROU --  AMIKACIN --     Microbiology: Recent Results (from the past 720 hour(s))  CULTURE, BLOOD (ROUTINE X 2)     Status: Normal (Preliminary result)   Collection Time   03/22/12  7:10 PM      Component Value Range Status Comment   Specimen Description BLOOD ARM LEFT   Final    Special Requests     Final    Value: BOTTLES DRAWN AEROBIC AND ANAEROBIC AERO 10CC,ANAE 5CC   Culture  Setup Time 03/23/2012 04:02   Final    Culture     Final    Value:        BLOOD CULTURE RECEIVED NO GROWTH TO DATE CULTURE WILL BE HELD FOR 5 DAYS BEFORE ISSUING A FINAL NEGATIVE REPORT   Report Status PENDING   Incomplete   CULTURE, BLOOD (ROUTINE X 2)     Status: Normal (Preliminary result)    Collection Time   03/22/12  7:27 PM      Component Value Range Status Comment   Specimen Description BLOOD ARM LEFT   Final    Special Requests BOTTLES DRAWN AEROBIC AND ANAEROBIC 10CC   Final    Culture  Setup Time 03/23/2012 04:02   Final    Culture     Final    Value:        BLOOD CULTURE RECEIVED NO GROWTH TO DATE CULTURE WILL BE HELD FOR 5 DAYS BEFORE ISSUING A FINAL NEGATIVE REPORT   Report Status PENDING   Incomplete   WOUND CULTURE     Status: Normal (Preliminary result)   Collection Time   03/23/12  2:13 AM      Component Value Range Status Comment   Specimen Description WOUND PENIS   Final    Special Requests Normal   Final    Gram Stain     Final    Value: NO WBC SEEN     FEW SQUAMOUS EPITHELIAL CELLS PRESENT     FEW GRAM NEGATIVE COCCOBACILLI     FEW GRAM POSITIVE COCCI IN PAIRS     RARE GRAM NEGATIVE RODS   Culture Culture reincubated for better growth   Final    Report Status PENDING   Incomplete   EYE CULTURE  Status: Normal (Preliminary result)   Collection Time   03/23/12  2:13 AM      Component Value Range Status Comment   Specimen Description EYE   Final    Special Requests Normal   Final    Culture NO GROWTH 1 DAY   Final    Report Status PENDING   Incomplete   MRSA PCR SCREENING     Status: Normal   Collection Time   03/23/12  4:50 PM      Component Value Range Status Comment   MRSA by PCR NEGATIVE  NEGATIVE Final   GC/CHLAMYDIA PROBE AMP     Status: Normal   Collection Time   03/23/12  7:33 PM      Component Value Range Status Comment   CT Probe RNA NEGATIVE  NEGATIVE Final    GC Probe RNA NEGATIVE  NEGATIVE Final     Medical History: Past Medical History  Diagnosis Date  . Hypertension   . Diverticulosis   . Hemorrhoids   . Perirectal abscess   . Colitis   . History of shingles    Assessment: 46 yoAAm with pain in rectum who presented to [facility] 12/18 for foul-smelling draining of scrotal and inguinal abscesses.  Pharmacy consulted to  start Vancomycin & Zosyn.     Events: Found to have perirectal fistulas during I&D that will require diverting colostomy.  Presently afebrile and WBC trend down.  SCr trend down with good UOP (1.22ml/kg/hr)  Zosyn RX 12/18 >> Vanc RX 12/18 >> (trough 22.2, drawn 1h before dose) Acyclovir RX 12/18 >>12/19 Clinda 12/19>>12/20  12/18 Blood - NGTD 12/19 Wound - GPC pairs, GNR, GN coccobacilli 12/19 MRSA - NEG 12/18 HIV reactive   Goal of Therapy:  Vancomycin trough level 15-20 mcg/ml  Plan:  Continue Vancomycin 1g IV q8h given concentration drawn early, doses in prior 24h given late, and improving UOP.  If SCr increases or UOP declines would have narrow window to recheck a trough. Follow up SCr, UOP, cultures, clinical course and adjust as clinically indicated.  Thank you for allowing pharmacy to be a part of this patients care team.  Lovenia Kim Pharm.D., BCPS Clinical Pharmacist 03/24/2012 6:38 PM Pager: [phone] Phone: [phone]

## 2012-03-25 DIAGNOSIS — D649 Anemia, unspecified: Secondary | ICD-10-CM

## 2012-03-25 LAB — CBC
HCT: 25.2 % — ABNORMAL LOW (ref 39.0–52.0)
MCH: 27 pg (ref 26.0–34.0)
MCHC: 33.3 g/dL (ref 30.0–36.0)
RDW: 15.7 % — ABNORMAL HIGH (ref 11.5–15.5)

## 2012-03-25 LAB — BASIC METABOLIC PANEL
BUN: 5 mg/dL — ABNORMAL LOW (ref 6–23)
Creatinine, Ser: 0.72 mg/dL (ref 0.50–1.35)
GFR calc Af Amer: >90 mL/min (ref >90)
GFR calc non Af Amer: >90 mL/min (ref >90)

## 2012-03-25 LAB — EYE CULTURE
Culture: NO GROWTH
Special Requests: NORMAL

## 2012-03-25 MED ORDER — POTASSIUM CHLORIDE IN NACL 40-0.9 MEQ/L-% IV SOLN
INTRAVENOUS | Status: DC
  Administered 2012-03-25: 50 mL/h via INTRAVENOUS
  Administered 2012-03-28 – 2012-03-29 (×4): via INTRAVENOUS
  Filled 2012-03-25 (×14): qty 1000

## 2012-03-25 MED ORDER — OXYCODONE HCL 5 MG PO TABS
10.0000 mg | ORAL_TABLET | ORAL | Status: DC | PRN
  Administered 2012-03-25 – 2012-03-27 (×8): 10 mg via ORAL
  Filled 2012-03-25 (×2): qty 2
  Filled 2012-03-25: qty 3
  Filled 2012-03-25 (×6): qty 2

## 2012-03-25 MED ORDER — TEMAZEPAM 15 MG PO CAPS
30.0000 mg | ORAL_CAPSULE | Freq: Every evening | ORAL | Status: DC | PRN
  Administered 2012-04-01: 30 mg via ORAL
  Filled 2012-03-25: qty 2

## 2012-03-25 MED ORDER — FENTANYL CITRATE 0.05 MG/ML IJ SOLN
200.0000 ug | Freq: Once | INTRAMUSCULAR | Status: AC
  Administered 2012-03-25: 100 ug via INTRAVENOUS

## 2012-03-25 NOTE — Progress Notes (Signed)
Pt profile: 16M admitted 12/19 with extensive perirectal, perineal abscesses with fistulization, septic shock, anemia. S/P surgical exploration, debridement and irrigation 12/20. Found to be HIV + 12/20  Active problems: Perirectal and perineal abscesses Septic shock - resolved 12/20 New dx of HIV Anemia - received PRBCs 12/19  Lines, Tubes, etc: L [place] CVL 12/19 >>   Microbiology: Wound 12/19 >> mixed organisms >>  Blood 12/19 >>   Antibiotics: (ID service managing) Clinda 12/19 >> 12/20 Vanc 12/19 >>  Zosyn 12/19 >>    Studies/Events: 12/19 CT abd/pelvis: Multiple gas containing tracks within the posterior subcutaneous tissues, perineum, and suprapubic region. Centrally, the tracks extend perianal and to abut the coccyx.   Consults:  CCS Materials engineer) ID    Best Practice: DVT: SCDs SUP: N/I Nutrition: reg diet Glycemic control: N/I Sedation/analgesia: PRN fentanyl    Subj: Difficulty sleeping last PM due to pain. NE resumed yest for MAP < 65 mmHg  Obj: Filed Vitals:   03/25/12 1239  BP:   Pulse:   Temp: 98.1 F (36.7 C)  Resp:     Gen: NAD HEENT: WNL Neck: no JVD Chest: clear Cardiac: RRR s M ZOX:WRUE, NABS, NT Ext: warm, no edema  Perineal wounds dressed - did not examine  BMET    Component Value Date/Time   NA 131* 03/25/2012 0500   K 3.4* 03/25/2012 0500   CL 99 03/25/2012 0500   CO2 22 03/25/2012 0500   GLUCOSE 99 03/25/2012 0500   BUN 5* 03/25/2012 0500   CREATININE 0.72 03/25/2012 0500   CALCIUM 8.3* 03/25/2012 0500   GFRNONAA >90 03/25/2012 0500   GFRAA >90 03/25/2012 0500    CBC    Component Value Date/Time   WBC 8.5 03/25/2012 0500   RBC 3.11* 03/25/2012 0500   HGB 8.4* 03/25/2012 0500   HCT 25.2* 03/25/2012 0500   PLT 219 03/25/2012 0500   MCV 81.0 03/25/2012 0500   MCH 27.0 03/25/2012 0500   MCHC 33.3 03/25/2012 0500   RDW 15.7* 03/25/2012 0500   LYMPHSABS 1.2 03/23/2012 1039   MONOABS 0.8 03/23/2012 1039   EOSABS 0.1  03/23/2012 1039   BASOSABS 0.0 03/23/2012 1039    CXR: no new   IMPRESSION: 1) Extensive perirectal and perineal abscesses  S/P irrigation and debridement 12/19  Per C[facility], will likely need diverting colostomy 2) Anemia  S/p transfusion 12/19 3) Septic shock resolved 4) New dx of HIV, likely AIDS   PLAN/RECS:  -If remains off pressors through day today, transfer out of ICU and back to IMTS 12/22 -Abx as above. ID to manage -Post op mgmt and timing of colostomy per CCS -Cont diet -Mobilize as tolerated -CD4 and viral load pending -Discussed with Dr Orvan Falconer -Work on analgesia   Billy Fischer, MD ; [facility] [phone].  After 5:30 PM or weekends, call [phone]

## 2012-03-25 NOTE — Progress Notes (Signed)
Patient ID: SLAYDEN MENNENGA, male   DOB: [birth year], 46 y.o.   MRN: [id number] 2 Days Post-Op  Subjective: No new C/O, moderate perineal pain  Objective: Vital signs in last 24 hours: Temp:  [97 F (36.1 C)-98.6 F (37 C)] 98.4 F (36.9 C) (12/21 0750) Pulse Rate:  [57-95] 94  (12/21 0630) Resp:  [8-29] 17  (12/21 0700) BP: (83-119)/(50-87) 119/68 mmHg (12/21 0645) SpO2:  [86 %-100 %] 100 % (12/21 0630) Arterial Line BP: (75-149)/(51-102) 146/74 mmHg (12/21 0700) Last BM Date: 03/21/12  Intake/Output from previous day: 12/20 0701 - 12/21 0700 In: 2917.4 [P.O.:180; I.V.:2337.4; IV Piggyback:400] Out: 3015 [Urine:3015] Intake/Output this shift:    Incision/Wound: Multiple perineal wounds repacked. Stool drianing through mulltiple wounds.  No necrotic tissue  Lab Results:   Basename 03/25/12 0500 03/24/12 0500  WBC 8.5 7.9  HGB 8.4* 7.6*  HCT 25.2* 23.5*  PLT 219 228   BMET  Basename 03/25/12 0500 03/24/12 0500  NA 131* 130*  K 3.4* 3.7  CL 99 101  CO2 22 21  GLUCOSE 99 86  BUN 5* 11  CREATININE 0.72 0.85  CALCIUM 8.3* 8.4     Studies/Results: Dg Chest Port 1 View  03/24/2012  *RADIOLOGY REPORT*  Clinical Data: Shortness of breath, evaluate endotracheal tube position  PORTABLE CHEST - 1 VIEW  Comparison: Portable chest x-ray of 03/23/2012  Findings: The lungs are not quite as well aerated with increase in basilar atelectasis.  No endotracheal tube is visible.  The left central venous line is unchanged in position.  Heart size is stable.  IMPRESSION: No endotracheal tube is seen.  The lungs are poorly aerated with slight increase in basilar atelectasis.   Original Report Authenticated By: Dwyane Dee, M.D.    Dg Chest Port 1 View  03/23/2012  *RADIOLOGY REPORT*  Clinical Data: Central line placement.  PORTABLE CHEST - 1 VIEW  Comparison: Earlier film, same date.  Findings: The left subclavian central venous catheter tip is in the mid SVC at the level of the carina.   Low lung volumes with vascular crowding and atelectasis.  No pneumothorax.  IMPRESSION:  1.  Left subclavian central venous catheter tip is in the mid SVC. No complicating features. 2.  Low lung volumes with vascular crowding and atelectasis.   Original Report Authenticated By: Rudie Meyer, M.D.     Anti-infectives: Anti-infectives     Start     Dose/Rate Route Frequency Ordered Stop   03/23/12 1200   clindamycin (CLEOCIN) IVPB 900 mg  Status:  Discontinued        900 mg 100 mL/hr over 30 Minutes Intravenous 4 times per day 03/23/12 0946 03/24/12 1030   03/23/12 1030  piperacillin-tazobactam (ZOSYN) IVPB 3.375 g       3.375 g 12.5 mL/hr over 240 Minutes Intravenous 3 times per day 03/23/12 0922     03/23/12 1030   vancomycin (VANCOCIN) IVPB 1000 mg/200 mL premix        1,000 mg 200 mL/hr over 60 Minutes Intravenous Every 8 hours 03/23/12 0922     03/23/12 0000   acyclovir (ZOVIRAX) tablet 400 mg  Status:  Discontinued        400 mg Oral 5 times daily 03/22/12 2359 03/23/12 1039   03/22/12 1845   vancomycin (VANCOCIN) IVPB 1000 mg/200 mL premix        1,000 mg 200 mL/hr over 60 Minutes Intravenous  Once 03/22/12 1836 03/22/12 2237   03/22/12 1845  piperacillin-tazobactam (ZOSYN)  IVPB 3.375 g       3.375 g 12.5 mL/hr over 240 Minutes Intravenous  Once 03/22/12 1836 03/23/12 0015          Assessment/Plan: s/p Procedure(s): IRRIGATION AND DEBRIDEMENT PERIRECTAL ABSCESS Extensive perineal fistula disease.  HIV +, ? Crohn's Dz Again discussed with pt that he will need diverting colostomy and he is agreeable.  Prob Mon   LOS: 3 days    Quianna Avery T 03/25/2012

## 2012-03-25 NOTE — Progress Notes (Signed)
Patient ID: Leon Nichols, male   DOB: [birth year], 46 y.o.   MRN: [id number]    [facility]    Date of Admission:  03/22/2012           Day 4 vancomycin        Day 4 piperacillin tazobactam Principal Problem:  *Septic shock Active Problems:  Colitis  Diverticulosis of colon (without mention of hemorrhage)  Perianal abscess  Perianal fistula  Anemia  Penile ulcer  Non-penile genital ulcer  Multiple Abscesses of skin and subcutaneous tissue  Hypotension  Keratitis  Necrotizing fasciitis  HIV (human immunodeficiency virus infection)      . antiseptic oral rinse  15 mL Mouth Rinse BID  . influenza  inactive virus vaccine  0.5 mL Intramuscular Tomorrow-1000  . piperacillin-tazobactam (ZOSYN)  IV  3.375 g Intravenous Q8H  . pneumococcal 23 valent vaccine  0.5 mL Intramuscular Tomorrow-1000  . sodium chloride  3 mL Intravenous Q12H  . vancomycin  1,000 mg Intravenous Q8H    Subjective: He is feeling better today with less perineal pain. He denies ever having been tested for HIV previous to this admission.  Objective: Temp:  [97 F (36.1 C)-98.6 F (37 C)] 98.4 F (36.9 C) (12/21 0750) Pulse Rate:  [57-94] 94  (12/21 0630) Resp:  [8-28] 17  (12/21 0700) BP: (83-119)/(50-87) 119/68 mmHg (12/21 0645) SpO2:  [86 %-100 %] 100 % (12/21 0630) Arterial Line BP: (75-149)/(51-102) 146/74 mmHg (12/21 0700)  General: He was asleep upon entering the room but arouses easily and has normal conversation. He appears comfortable. Lungs: Clear Cor: Regular S1-S2 no murmurs Abdomen: Soft nontender Perineal wounds dressed  Lab Results Lab Results  Component Value Date   WBC 8.5 03/25/2012   HGB 8.4* 03/25/2012   HCT 25.2* 03/25/2012   MCV 81.0 03/25/2012   PLT 219 03/25/2012    HIV EIA: Reactive (western blot, HIV viral load and CD4 count are pending)  Microbiology: Recent Results (from the past 240 hour(s))  CULTURE, BLOOD (ROUTINE X 2)     Status:  Normal (Preliminary result)   Collection Time   03/22/12  7:10 PM      Component Value Range Status Comment   Specimen Description BLOOD ARM LEFT   Final    Special Requests     Final    Value: BOTTLES DRAWN AEROBIC AND ANAEROBIC AERO 10CC,ANAE 5CC   Culture  Setup Time 03/23/2012 04:02   Final    Culture     Final    Value:        BLOOD CULTURE RECEIVED NO GROWTH TO DATE CULTURE WILL BE HELD FOR 5 DAYS BEFORE ISSUING A FINAL NEGATIVE REPORT   Report Status PENDING   Incomplete   CULTURE, BLOOD (ROUTINE X 2)     Status: Normal (Preliminary result)   Collection Time   03/22/12  7:27 PM      Component Value Range Status Comment   Specimen Description BLOOD ARM LEFT   Final    Special Requests BOTTLES DRAWN AEROBIC AND ANAEROBIC 10CC   Final    Culture  Setup Time 03/23/2012 04:02   Final    Culture     Final    Value:        BLOOD CULTURE RECEIVED NO GROWTH TO DATE CULTURE WILL BE HELD FOR 5 DAYS BEFORE ISSUING A FINAL NEGATIVE REPORT   Report Status PENDING   Incomplete   WOUND CULTURE     Status:  Normal (Preliminary result)   Collection Time   03/23/12  2:13 AM      Component Value Range Status Comment   Specimen Description WOUND PENIS   Final    Special Requests Normal   Final    Gram Stain     Final    Value: NO WBC SEEN     FEW SQUAMOUS EPITHELIAL CELLS PRESENT     FEW GRAM NEGATIVE COCCOBACILLI     FEW GRAM POSITIVE COCCI IN PAIRS     RARE GRAM NEGATIVE RODS   Culture Culture reincubated for better growth   Final    Report Status PENDING   Incomplete   EYE CULTURE     Status: Normal   Collection Time   03/23/12  2:13 AM      Component Value Range Status Comment   Specimen Description EYE   Final    Special Requests Normal   Final    Culture NO GROWTH 2 DAYS   Final    Report Status 03/25/2012 FINAL   Final   MRSA PCR SCREENING     Status: Normal   Collection Time   03/23/12  4:50 PM      Component Value Range Status Comment   MRSA by PCR NEGATIVE  NEGATIVE Final     GC/CHLAMYDIA PROBE AMP     Status: Normal   Collection Time   03/23/12  7:33 PM      Component Value Range Status Comment   CT Probe RNA NEGATIVE  NEGATIVE Final    GC Probe RNA NEGATIVE  NEGATIVE Final    Assessment: He has polymicrobial perineal wound infections and has agreed to diverting colostomy next week. I will continue his current antibiotic regimen pending final wound cultures.  We are awaiting the results of his HIV Western blot and viral load to determine the significance of his reactive HIV EIA.  Plan: 1. Continue vancomycin and piperacillin tazobactam pending final cultures 2. Await results of HIV Western blot, viral load and CD4 count  Leon Asters, MD [facility] [phone] pager   [phone] cell 03/25/2012, 11:37 AM

## 2012-03-26 ENCOUNTER — Encounter (HOSPITAL_COMMUNITY): Payer: Self-pay | Admitting: Internal Medicine

## 2012-03-26 DIAGNOSIS — K603 Anal fistula: Secondary | ICD-10-CM

## 2012-03-26 LAB — WOUND CULTURE: Gram Stain: NONE SEEN

## 2012-03-26 LAB — HIV-1 RNA ULTRAQUANT REFLEX TO GENTYP+
HIV 1 RNA Quant: 61754 copies/mL — ABNORMAL HIGH (ref <20)
HIV-1 RNA Quant, Log: 4.79 {Log} — ABNORMAL HIGH (ref <1.30)

## 2012-03-26 MED ORDER — AMIODARONE HCL IN DEXTROSE 360-4.14 MG/200ML-% IV SOLN
INTRAVENOUS | Status: AC
  Filled 2012-03-26: qty 200

## 2012-03-26 NOTE — Progress Notes (Signed)
Patient ID: KEYION KNACK, male   DOB: 1965-08-02, 46 y.o.   MRN: [id number] 3 Days Post-Op  Subjective: Feels better, sore  Objective: Vital signs in last 24 hours: Temp:  [98.1 F (36.7 C)-99.3 F (37.4 C)] 98.6 F (37 C) (12/22 0500) Pulse Rate:  [76-104] 88  (12/22 0600) Resp:  [6-26] 17  (12/22 0600) BP: (74-121)/(41-101) 90/54 mmHg (12/22 0600) SpO2:  [88 %-100 %] 98 % (12/22 0600) Arterial Line BP: (93-154)/(51-84) 93/51 mmHg (12/21 1500) Last BM Date: 03/21/12  Intake/Output from previous day: 12/21 0701 - 12/22 0700 In: 2273.3 [P.O.:240; I.V.:983.3; IV Piggyback:1050] Out: 2795 [Urine:2795] Intake/Output this shift:    General appearance: alert, cooperative and no distress Incision/Wound: Perineal wounds repacked, generally clean some purulent drainage  Lab Results:   Basename 03/25/12 0500 03/24/12 0500  WBC 8.5 7.9  HGB 8.4* 7.6*  HCT 25.2* 23.5*  PLT 219 228   BMET  Basename 03/25/12 0500 03/24/12 0500  NA 131* 130*  K 3.4* 3.7  CL 99 101  CO2 22 21  GLUCOSE 99 86  BUN 5* 11  CREATININE 0.72 0.85  CALCIUM 8.3* 8.4     Studies/Results: No results found.  Anti-infectives: Anti-infectives     Start     Dose/Rate Route Frequency Ordered Stop   03/23/12 1200   clindamycin (CLEOCIN) IVPB 900 mg  Status:  Discontinued        900 mg 100 mL/hr over 30 Minutes Intravenous 4 times per day 03/23/12 0946 03/24/12 1030   03/23/12 1030  piperacillin-tazobactam (ZOSYN) IVPB 3.375 g       3.375 g 12.5 mL/hr over 240 Minutes Intravenous 3 times per day 03/23/12 0922     03/23/12 1030   vancomycin (VANCOCIN) IVPB 1000 mg/200 mL premix        1,000 mg 200 mL/hr over 60 Minutes Intravenous Every 8 hours 03/23/12 0922     03/23/12 0000   acyclovir (ZOVIRAX) tablet 400 mg  Status:  Discontinued        400 mg Oral 5 times daily 03/22/12 2359 03/23/12 1039   03/22/12 1845   vancomycin (VANCOCIN) IVPB 1000 mg/200 mL premix        1,000 mg 200 mL/hr over 60  Minutes Intravenous  Once 03/22/12 1836 03/22/12 2237   03/22/12 1845  piperacillin-tazobactam (ZOSYN) IVPB 3.375 g       3.375 g 12.5 mL/hr over 240 Minutes Intravenous  Once 03/22/12 1836 03/23/12 0015          Assessment/Plan: s/p Procedure(s): IRRIGATION AND DEBRIDEMENT PERIRECTAL ABSCESS Extensive severe rectal fistulas Patient agreeable to diverting colostomy, prob tomorrow, have made NPO   LOS: 4 days    Martez Weiand T 03/26/2012

## 2012-03-26 NOTE — Progress Notes (Addendum)
Pt profile: Leon Nichols admitted 12/19 with extensive perirectal, perineal abscesses with fistulization, septic shock, anemia. S/P surgical exploration, debridement and irrigation 12/20. Found to be HIV + 12/20  Active problems: Perirectal and perineal abscesses Septic shock - resolved 12/20 New dx of HIV Anemia - received PRBCs 12/19  Lines, Tubes, etc: L Barlow CVL 12/19 >>   Microbiology: Wound 12/19 >> mixed organisms >>  Blood 12/19 >>   Antibiotics: (ID service managing) Clinda 12/19 >> 12/20 Vanc 12/19 >>  Zosyn 12/19 >>    Studies/Events: 12/19 CT abd/pelvis: Multiple gas containing tracks within the posterior subcutaneous tissues, perineum, and suprapubic region. Centrally, the tracks extend perianal and to abut the coccyx.   Consults:  [facility] Materials engineer) ID    Best Practice: DVT: SCDs SUP: N/I Nutrition: reg diet Glycemic control: N/I Sedation/analgesia: PRN fentanyl    Subj: No new complaints. Off pressors > 24 hrs  Obj: Filed Vitals:   03/26/12 0900  BP: 102/46  Pulse: 74  Temp:   Resp: 20    Gen: NAD HEENT: WNL Neck: no JVD Chest: clear Cardiac: RRR s M ZOX:WRUE, NABS, NT Ext: warm, no edema  Perineal wounds dressed - did not examine  BMET    Component Value Date/Time   NA 131* 03/25/2012 0500   K 3.4* 03/25/2012 0500   CL 99 03/25/2012 0500   CO2 22 03/25/2012 0500   GLUCOSE 99 03/25/2012 0500   BUN 5* 03/25/2012 0500   CREATININE 0.72 03/25/2012 0500   CALCIUM 8.3* 03/25/2012 0500   GFRNONAA >90 03/25/2012 0500   GFRAA >90 03/25/2012 0500    CBC    Component Value Date/Time   WBC 8.5 03/25/2012 0500   RBC 3.11* 03/25/2012 0500   HGB 8.4* 03/25/2012 0500   HCT 25.2* 03/25/2012 0500   PLT 219 03/25/2012 0500   MCV 81.0 03/25/2012 0500   MCH 27.0 03/25/2012 0500   MCHC 33.3 03/25/2012 0500   RDW 15.7* 03/25/2012 0500   LYMPHSABS 1.2 03/23/2012 1039   MONOABS 0.8 03/23/2012 1039   EOSABS 0.1 03/23/2012 1039   BASOSABS 0.0  03/23/2012 1039    CXR: no new   IMPRESSION: 1) Extensive perirectal and perineal abscesses  S/P irrigation and debridement 12/19  Per [facility], will likely need diverting colostomy 2) Anemia  S/p transfusion 12/19 3) Septic shock resolved 4) New dx of HIV, likely AIDS   PLAN/RECS:  -Transfer to [facility]. IMTS to resume care and PCCM to sign off  Discussed with Dr Everardo Beals -Abx as above. ID to manage -Post op mgmt and timing of colostomy per [facility] -Cont diet -Mobilize as tolerated -CD4 and viral load pending -Cont analgesia  Will leave CVL in place while still on Vancomycin. This should be discontinued when Vancomycin is discontinued  Billy Fischer, MD ; [employer] [phone].  After 5:30 PM or weekends, call [phone]

## 2012-03-26 NOTE — Progress Notes (Signed)
Patient ID: Leon Nichols, male   DOB: 24-Oct-1965, 46 y.o.   MRN: [id number]    [facility]    Date of Admission:  03/22/2012           Day 5 vancomycin        Day 5 piperacillin tazobactam Principal Problem:  *Perianal abscess and fistula Active Problems:  Colitis  Diverticulosis of colon (without mention of hemorrhage)  Anemia  Penile ulcer  Non-penile genital ulcer  Hypotension  Keratitis  Septic shock  HIV (human immunodeficiency virus infection)  Subjective: This pain is under a little better control. He is off pressors.  Objective: Temp:  [98.1 F (36.7 C)-99.3 F (37.4 C)] 98.1 F (36.7 C) (12/22 1205) Pulse Rate:  [74-102] 89  (12/22 1000) Resp:  [6-24] 18  (12/22 1000) BP: (74-113)/(41-101) 89/55 mmHg (12/22 1000) SpO2:  [88 %-100 %] 95 % (12/22 1000) Arterial Line BP: (93-98)/(51-63) 93/51 mmHg (12/21 1500)  General: Alert and in no distress Perineal wounds dressed and not examined  Lab Results HIV 1 RNA Quant (copies/mL)  Date Value  03/24/2012 61754*    Microbiology: Recent Results (from the past 240 hour(s))  CULTURE, BLOOD (ROUTINE X 2)     Status: Normal (Preliminary result)   Collection Time   03/22/12  7:10 PM      Component Value Range Status Comment   Specimen Description BLOOD ARM LEFT   Final    Special Requests     Final    Value: BOTTLES DRAWN AEROBIC AND ANAEROBIC AERO 10CC,ANAE 5CC   Culture  Setup Time 03/23/2012 04:02   Final    Culture     Final    Value:        BLOOD CULTURE RECEIVED NO GROWTH TO DATE CULTURE WILL BE HELD FOR 5 DAYS BEFORE ISSUING A FINAL NEGATIVE REPORT   Report Status PENDING   Incomplete   CULTURE, BLOOD (ROUTINE X 2)     Status: Normal (Preliminary result)   Collection Time   03/22/12  7:27 PM      Component Value Range Status Comment   Specimen Description BLOOD ARM LEFT   Final    Special Requests BOTTLES DRAWN AEROBIC AND ANAEROBIC 10CC   Final    Culture  Setup Time 03/23/2012  04:02   Final    Culture     Final    Value:        BLOOD CULTURE RECEIVED NO GROWTH TO DATE CULTURE WILL BE HELD FOR 5 DAYS BEFORE ISSUING A FINAL NEGATIVE REPORT   Report Status PENDING   Incomplete   WOUND CULTURE     Status: Normal (Preliminary result)   Collection Time   03/23/12  2:13 AM      Component Value Range Status Comment   Specimen Description WOUND PENIS   Final    Special Requests Normal   Final    Gram Stain     Final    Value: NO WBC SEEN     FEW SQUAMOUS EPITHELIAL CELLS PRESENT     FEW GRAM NEGATIVE COCCOBACILLI     FEW GRAM POSITIVE COCCI IN PAIRS     RARE GRAM NEGATIVE RODS   Culture Culture reincubated for better growth   Final    Report Status PENDING   Incomplete   EYE CULTURE     Status: Normal   Collection Time   03/23/12  2:13 AM      Component Value Range Status  Comment   Specimen Description EYE   Final    Special Requests Normal   Final    Culture NO GROWTH 2 DAYS   Final    Report Status 03/25/2012 FINAL   Final   MRSA PCR SCREENING     Status: Normal   Collection Time   03/23/12  4:50 PM      Component Value Range Status Comment   MRSA by PCR NEGATIVE  NEGATIVE Final   GC/CHLAMYDIA PROBE AMP     Status: Normal   Collection Time   03/23/12  7:33 PM      Component Value Range Status Comment   CT Probe RNA NEGATIVE  NEGATIVE Final    GC Probe RNA NEGATIVE  NEGATIVE Final    Assessment: He has extensive, chronic infected perineal wounds. He has agreed to diverting colostomy. He has polymicrobial infection of his wound and his wound culture is still pending. I will continue his current antibiotics for now.  His elevated HIV viral load confirms HIV infection. I've informed him of this and told him that his condition is treatable. His CD4 count is pending.  Plan: 1. Continue current antibiotics pending final wound cultures 2. CD4 count should be available tomorrow afternoon  Cliffton Asters, MD [facility] [phone] pager   [phone] cell 03/26/2012, 12:55 PM

## 2012-03-26 NOTE — H&P (Signed)
Internal Medicine Teaching Service Transfer Note--Leon Nichols  Leon Nichols is a 46 year old African American male with PMH significant for HTN, diverticulosis per colonoscopy in 5/13 by Dr. Marina Goodell, nonspecific colitis, and Herpes Zoster in 6/13 complicated by left Ramsey-Hunt Syndrome who presented to the [facility] ED with complaints of rectal pain. At that time, he was found to have numerous skin openings/abscesses in perirectal, perineum, scrotal, and penile region with malodorous purulent discharge.  He has a hx of diffuse boils all over his skin since he was 46 years of age and reports a 30-40Lbs weight loss over the past several months.  CT Pelvis with contrast showed: Multiple gas containing tracks within the posterior subcutaneous tissues, perineum, and suprapubic region. Centrally, the tracks extend perianal and to abut the coccyx.  Pericolonic fat stranding and wall thickening involving the descending colon. Prominent pericolonic lymph nodes.  He was evaluated in the ED by surgery and IMTS on admission and found to be in septic shock.  Care was transferred to [facility] and he subsequently also went to the OR for irrigation and debridement of perirectal abscess.    03/23/12: transfused initially with 2 units PRBCs on admission, hypotensive despite fluid resuscitation, tachycardia, fever 101.75F-->septic shock, PCCM consulted.  Continued on IV Vancomycin and Zosyn (for concern of possible nectrotizing fascitis) and Acyclovir (started 03/22/12) and started on Zosyn as well (03/23/12). CVL and Arterial catheter placed.  Taken to OR by surgery--Dr. Johna Sheriff, Transfused another 2 units of PRBCs.  Irrigation and debridement perirectal abscess 03/23/12 per Dr. Jamse Mead Op Note: -5 x 5 cm widely open wound in the right anterior perirectal space communicated widely into the right perirectal space. -2 large openings in the anus and rectum on the right side, one at the dentate line and one about 4 or 5 cm above this laterally.   - 5 x 1 cm wound more laterally in the right perineum that tracked subcutaneously anteriorly up alongside the scrotum with multiple fistulas draining tracks to the skin superiorly.  -In the left perineum/perirectal space was a 5 x 5 cm wound with clean granulation and also tracked subcutaneously along the left side of the scrotum with some draining fistulous tracts along the direction. -There were 2 1 cm wounds posteriorly on the left buttock that tracked up toward the anus.  - On either side of the mons toward the thigh creases were a couple of approximately 3 x 2 cm superficial wounds that did not appear to track or contain any necrotic tissue.  -There was a lot of superficial erosions around the base and shaft of the penis but again no necrotic tissue or deep tracts or purulent drainage.  Appears to be most consistent with extensive unusual perirectal fistulas disease.  Will need a divergent colostomy at some point.  03/24/12: POD#1 Irrigation and debridement perirectal abscess.  Septic shock resolved.  New dx of HIV (antibody positive). ID consulted: D/C clindamycin, continue Vancomycin and Zosyn awaiting culture data. Wound cx: mixed organisms, blood cx pending.  F/u CD4 count and viral load with genotype.   03/25/12: POD#2 s/p surgical exploration and Irrigation and debridement perirectal abscess--will need diverting colostomy likely Monday 03/27/12.  Vancomycin and Zosyn day 4. Off pressors.    03/26/12: POD#3 s/p surgical exploration and Irrigation and debridement perirectal abscess--diverting colostomy probably tomorrow per surgery, NPO.  HIV RNA Quant: [id number].  CD4 count pending. CT/GC probe negative. Blood Cx (03/22/12)--NGTD.  Eye Cx 03/23/12: Negative. Wound cx 03/23/12:: multiple organisms present, none predominant, no  staph aureus isolated, no group a strep (s. Pyogenes) isolated.   Continue Vancomycin and Zosyn day 5.    Today, 03/27/12, Leon Nichols is seen and examined at bedside.  He  is POD#4 irrigation and debridement of perirectal abscesses.  He is scheduled to go for diverting colostomy later this morning.  He claims to feel better since admission and improving since surgery.  He has not had a BM yet.  He currently denies any headaches, fever, chills, chest pain, N/V/D, shortness of breath, or abdominal pain at this time.     Meds: Prescriptions prior to admission  Medication Sig Dispense Refill  . lisinopril (PRINIVIL,ZESTRIL) 40 MG tablet Take 1 tablet (40 mg total) by mouth daily.  30 tablet  1   Allergies: Allergies as of 03/22/2012  . (No Known Allergies)   Past Medical History  Diagnosis Date  . Hypertension   . Diverticulosis   . Hemorrhoids   . Perirectal abscess   . Colitis   . History of shingles   . HIV (human immunodeficiency virus infection) 03/24/2012   Past Surgical History  Procedure Date  . Colonoscopy 08/31/2011    Procedure: COLONOSCOPY;  Surgeon: Hilarie Fredrickson, MD;  Location: [facility] ENDOSCOPY;  Service: Endoscopy;  Laterality: N/A;   Family History  Problem Relation Age of Onset  . Hypertension Mother   . Diabetes Mother    History   Social History  . Marital Status: Married    Spouse Name: N/A    Number of Children: N/A  . Years of Education: N/A   Occupational History  . Not on file.   Social History Main Topics  . Smoking status: Current Every Day Smoker -- 0.2 packs/day for 15 years    Types: Cigarettes  . Smokeless tobacco: Not on file  . Alcohol Use: Yes  . Drug Use: Yes    Special: Marijuana, Cocaine  . Sexually Active: Yes   Other Topics Concern  . Not on file   Social History Narrative  . No narrative on file   Review of Systems: Pertinent items are noted in HPI.  Physical Exam: Blood pressure 128/90, pulse 95, temperature 99.8 F (37.7 C), temperature source Oral, resp. rate 21, height 6' 0.84" (1.85 m), weight 208 lb 8.9 oz (94.6 kg), SpO2 96.00%. Vitals reviewed. General: resting in bed, NAD HEENT:  PERRLA, EOMI, no scleral icterus Cardiac: RRR, no rubs, murmurs or gallops Pulm: clear to auscultation bilaterally, no wheezes, rales, or rhonchi Abd: soft, nontender, nondistended, BS present Ext: warm and well perfused, no pedal edema, +2 dp b/l Neuro: alert and oriented X3, cranial nerves II-XII grossly intact, strength and sensation to light touch equal in bilateral upper and lower extremities Skin: + papules on b/l upper extremities.  Dry dressing in place over penis/scrotal region and perianal region.  White/yellow drainage seen on dressing, - blood.    Lab results: Basic Metabolic Panel:  Basename 03/27/12 0531 03/25/12 0500  NA 130* 131*  K 3.6 3.4*  CL 97 99  CO2 26 22  GLUCOSE 86 99  BUN 3* 5*  CREATININE 0.64 0.72  CALCIUM 8.5 8.3*  MG -- --  PHOS -- --   CBC:  Basename 03/25/12 0500  WBC 8.5  NEUTROABS --  HGB 8.4*  HCT 25.2*  MCV 81.0  PLT 219   Anemia Panel:  Basename 03/24/12 1130  VITAMINB12 --  FOLATE 7.0  FERRITIN --  TIBC --  IRON --  RETICCTPCT --   Urine  Drug Screen: Drugs of Abuse     Component Value Date/Time   LABOPIA POSITIVE* 03/23/2012 0737   COCAINSCRNUR NONE DETECTED 03/23/2012 0737   LABBENZ NONE DETECTED 03/23/2012 0737   AMPHETMU NONE DETECTED 03/23/2012 0737   THCU NONE DETECTED 03/23/2012 0737   LABBARB NONE DETECTED 03/23/2012 0737    Imaging results:  *RADIOLOGY REPORT*  Clinical Data: Suprapubic/perianal region wounds.  CT PELVIS WITH CONTRAST  Technique: Multidetector CT imaging of the pelvis was performed  using the standard protocol following the bolus administration of  intravenous contrast.  Contrast: OMNIPAQUE IOHEXOL 300 MG/ML SOLN  Comparison: 08/29/2011  Findings: Visualized intrapelvic contents show pericolonic fat  stranding at the descending colon with prominent adjacent lymph  nodes up to 7 mm short axis.  The previously described cutaneous ulcer and fissure along the left  side of the back,  extending perianal and toward the coccyx is again  noted, with interval increased associated gas. The process is in  communication with tracks of gas and soft tissue coursing along the  posterior subcutaneous fat and anteriorly to involve the perineum  and suprapubic region. There is no associated loculated/drainable  fluid collection. Prominent inguinal lymph nodes, measuring up to  1.5 cm on the left.  No acute osseous finding.  IMPRESSION:  Multiple gas containing tracks within the posterior subcutaneous  tissues, perineum, and suprapubic region. Centrally, the tracks  extend perianal and to abut the coccyx. If acute, conditions such  as necrotizing fasciitis should be excluded. If chronic, recommend  correlation with the patient's immune status for inflammatory  conditions such as Crohn disease.  Inguinal lymphadenopathy may be reactive. Lymphoma should be  excluded.  Pericolonic fat stranding and wall thickening involving the  descending colon. Prominent pericolonic lymph nodes. Differential  for this appearance also includes inflammatory bowel disease and  lymphoma. Correlate with colonoscopy if not yet performed.  Discussed via telephone with the admitting service at 01:40 a.m. on  03/23/2012. Reportedly, this is a chronic process that is  progressing, and the patient has had a recent colonoscopy.  Original Report Authenticated By: Jearld Lesch, M.D.   Assessment & Plan by Problem: Mr. Gilkeson is a 46 year old African American male with PMH significant for HTN, diverticulosis, nonspecific colitis, and Herpes Zoster complicated by left Ramsey-Hunt Syndrome admitted for perianal abscesses and fistulas with new diagnosis of HIV this hospital admission.     Perianal abscess and fistula--POD#4 s/p surgical exploration and Irrigation and debridement. -diverting colostomy today -f/u wound cultures -ID following--continue Vancomycin and Zosyn--day 6 -surgery following -spoke with  Dr. Matthias Hughs from GI, will await surgery today for further findings.  May consider CT enterography in the future for further investigation.  Would hold on serological testing at this time and correlate clinically.  Will formally consult with further information or any changes.     Anemia--Hb 7.4 on admission.  Transfused total of 4 units PRBCs during hospital course thus far.  Stable s/p surgery.   -Hb 8.2 03/27/12 -continue to monitor   Hypotension--improved.  Holding home dose lisinopril -IVF -continue to monitor   Septic shock--resolved.  Was under care of PCCM, transferred back to IMTS 03/27/12.    HIV (human immunodeficiency virus infection)--new diagnosis this admission.  HIV antibody reactive + 03/23/12.  HIV 1 RNA Quant: [id number]. -f/u CD4 count -ID following   HSV--HSV 1 and HSV 2 IgG positive 03/23/12.  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  -continue to monitor  Diet: NPO  Dvt Ppx:  SCDs  Dispo: Disposition is deferred at this time, awaiting improvement of current medical problems.   The patient does have a current PCP (MARTIN,NYKEDTRA, NP), therefore will not be requiring OPC follow-up after discharge.   The patient does not know have transportation limitations that hinder transportation to clinic appointments.  SignedDarden Palmer 03/27/2012, 8:45 AM

## 2012-03-27 ENCOUNTER — Inpatient Hospital Stay (HOSPITAL_COMMUNITY): Payer: Medicaid Other | Admitting: Anesthesiology

## 2012-03-27 ENCOUNTER — Encounter (HOSPITAL_COMMUNITY)
Admission: EM | Disposition: A | Discharge status: Home / Self Care | Payer: Self-pay | Source: Home / Self Care | Attending: Internal Medicine

## 2012-03-27 ENCOUNTER — Encounter (HOSPITAL_COMMUNITY): Payer: Self-pay | Admitting: Anesthesiology

## 2012-03-27 DIAGNOSIS — B20 Human immunodeficiency virus [HIV] disease: Secondary | ICD-10-CM

## 2012-03-27 DIAGNOSIS — I959 Hypotension, unspecified: Secondary | ICD-10-CM

## 2012-03-27 HISTORY — PX: LAPAROSCOPIC DIVERTED COLOSTOMY: SHX5892

## 2012-03-27 HISTORY — PX: DRESSING CHANGE UNDER ANESTHESIA: SHX5237

## 2012-03-27 LAB — CBC WITH DIFFERENTIAL/PLATELET
Basophils Relative: 0 % (ref 0–1)
Eosinophils Absolute: 0.2 10*3/uL (ref 0.0–0.7)
HCT: 24.8 % — ABNORMAL LOW (ref 39.0–52.0)
Hemoglobin: 8.2 g/dL — ABNORMAL LOW (ref 13.0–17.0)
Lymphocytes Relative: 10 % — ABNORMAL LOW (ref 12–46)
Lymphs Abs: 0.8 10*3/uL (ref 0.7–4.0)
Monocytes Absolute: 0.7 10*3/uL (ref 0.1–1.0)
Monocytes Relative: 9 % (ref 3–12)
Neutro Abs: 6.2 10*3/uL (ref 1.7–7.7)
Neutrophils Relative %: 78 % — ABNORMAL HIGH (ref 43–77)
Platelets: 307 10*3/uL (ref 150–400)
RBC: 3.07 MIL/uL — ABNORMAL LOW (ref 4.22–5.81)
RDW: 16.3 % — ABNORMAL HIGH (ref 11.5–15.5)
WBC: 7.9 10*3/uL (ref 4.0–10.5)

## 2012-03-27 LAB — T-HELPER CELLS (CD4) COUNT (NOT AT ARMC)
CD4 % Helper T Cell: 32 % — ABNORMAL LOW (ref 33–55)
CD4 T Cell Abs: 170 uL — ABNORMAL LOW (ref 400–2700)

## 2012-03-27 LAB — BASIC METABOLIC PANEL
BUN: 3 mg/dL — ABNORMAL LOW (ref 6–23)
Chloride: 97 mEq/L (ref 96–112)
GFR calc Af Amer: >90 mL/min (ref >90)
GFR calc non Af Amer: >90 mL/min (ref >90)
Potassium: 3.6 mEq/L (ref 3.5–5.1)
Sodium: 130 mEq/L — ABNORMAL LOW (ref 135–145)

## 2012-03-27 SURGERY — REPLACEMENT, DRESSING, WITH ANESTHESIA
Anesthesia: General | Site: Perineum | Wound class: Dirty or Infected

## 2012-03-27 MED ORDER — PROMETHAZINE HCL 25 MG/ML IJ SOLN: 6.2500 mg | INTRAMUSCULAR | Status: DC | PRN

## 2012-03-27 MED ORDER — NEOSTIGMINE METHYLSULFATE 1 MG/ML IJ SOLN
INTRAMUSCULAR | Status: DC | PRN
  Administered 2012-03-27: 4 mg via INTRAVENOUS
  Administered 2012-03-27: 1 mg via INTRAVENOUS

## 2012-03-27 MED ORDER — DOLUTEGRAVIR SODIUM 50 MG PO TABS
50.0000 mg | ORAL_TABLET | Freq: Every day | ORAL | Status: DC
  Administered 2012-03-28 – 2012-03-29 (×2): 50 mg via ORAL
  Filled 2012-03-27 (×3): qty 1

## 2012-03-27 MED ORDER — PROPOFOL 10 MG/ML IV BOLUS
INTRAVENOUS | Status: DC | PRN
  Administered 2012-03-27: 200 mg via INTRAVENOUS

## 2012-03-27 MED ORDER — ROCURONIUM BROMIDE 100 MG/10ML IV SOLN
INTRAVENOUS | Status: DC | PRN
  Administered 2012-03-27: 50 mg via INTRAVENOUS

## 2012-03-27 MED ORDER — LIDOCAINE HCL (CARDIAC) 20 MG/ML IV SOLN
INTRAVENOUS | Status: DC | PRN
  Administered 2012-03-27: 100 mg via INTRAVENOUS

## 2012-03-27 MED ORDER — METRONIDAZOLE 500 MG PO TABS
500.0000 mg | ORAL_TABLET | Freq: Three times a day (TID) | ORAL | Status: DC
  Administered 2012-03-27 – 2012-04-03 (×19): 500 mg via ORAL
  Filled 2012-03-27 (×26): qty 1

## 2012-03-27 MED ORDER — MIDAZOLAM HCL 5 MG/5ML IJ SOLN
INTRAMUSCULAR | Status: DC | PRN
  Administered 2012-03-27: 2 mg via INTRAVENOUS

## 2012-03-27 MED ORDER — HYDROMORPHONE HCL PF 1 MG/ML IJ SOLN
INTRAMUSCULAR | Status: AC
  Filled 2012-03-27: qty 1

## 2012-03-27 MED ORDER — EMTRICITABINE-TENOFOVIR DF 200-300 MG PO TABS
1.0000 | ORAL_TABLET | Freq: Every day | ORAL | Status: DC
  Administered 2012-03-28 – 2012-03-29 (×2): 1 via ORAL
  Filled 2012-03-27 (×3): qty 1

## 2012-03-27 MED ORDER — PROPOFOL 10 MG/ML IV BOLUS: INTRAVENOUS | Status: DC | PRN

## 2012-03-27 MED ORDER — LACTATED RINGERS IV SOLN
INTRAVENOUS | Status: DC
  Administered 2012-03-27: 12:00:00 via INTRAVENOUS

## 2012-03-27 MED ORDER — MIDAZOLAM HCL 2 MG/2ML IJ SOLN: 1.0000 mg | INTRAMUSCULAR | Status: DC | PRN

## 2012-03-27 MED ORDER — GLYCOPYRROLATE 0.2 MG/ML IJ SOLN
INTRAMUSCULAR | Status: DC | PRN
  Administered 2012-03-27: 0.6 mg via INTRAVENOUS
  Administered 2012-03-27: 0.2 mg via INTRAVENOUS

## 2012-03-27 MED ORDER — LACTATED RINGERS IV SOLN
INTRAVENOUS | Status: DC | PRN
  Administered 2012-03-27 (×2): via INTRAVENOUS

## 2012-03-27 MED ORDER — 0.9 % SODIUM CHLORIDE (POUR BTL) OPTIME
TOPICAL | Status: DC | PRN
  Administered 2012-03-27: 1000 mL

## 2012-03-27 MED ORDER — FENTANYL CITRATE 0.05 MG/ML IJ SOLN: 50.0000 ug | Freq: Once | INTRAMUSCULAR | Status: DC

## 2012-03-27 MED ORDER — DEXTROSE 5 % IV SOLN
2.0000 g | INTRAVENOUS | Status: DC
  Administered 2012-03-28 – 2012-04-02 (×6): 2 g via INTRAVENOUS
  Filled 2012-03-27 (×8): qty 2

## 2012-03-27 MED ORDER — SODIUM CHLORIDE 0.9 % IJ SOLN
10.0000 mL | INTRAMUSCULAR | Status: DC | PRN
  Administered 2012-03-27 – 2012-03-28 (×3): 10 mL

## 2012-03-27 MED ORDER — FENTANYL CITRATE 0.05 MG/ML IJ SOLN
INTRAMUSCULAR | Status: DC | PRN
  Administered 2012-03-27: 100 ug via INTRAVENOUS
  Administered 2012-03-27: 50 ug via INTRAVENOUS
  Administered 2012-03-27 (×2): 100 ug via INTRAVENOUS
  Administered 2012-03-27: 50 ug via INTRAVENOUS
  Administered 2012-03-27: 100 ug via INTRAVENOUS

## 2012-03-27 MED ORDER — HEPARIN SODIUM (PORCINE) 5000 UNIT/ML IJ SOLN
5000.0000 [IU] | Freq: Three times a day (TID) | INTRAMUSCULAR | Status: DC
  Administered 2012-03-27 – 2012-04-03 (×20): 5000 [IU] via SUBCUTANEOUS
  Filled 2012-03-27 (×23): qty 1

## 2012-03-27 MED ORDER — ONDANSETRON HCL 4 MG/2ML IJ SOLN
INTRAMUSCULAR | Status: DC | PRN
  Administered 2012-03-27: 4 mg via INTRAVENOUS

## 2012-03-27 MED ORDER — SULFAMETHOXAZOLE-TMP DS 800-160 MG PO TABS
1.0000 | ORAL_TABLET | ORAL | Status: DC
  Filled 2012-03-27 (×2): qty 1

## 2012-03-27 MED ORDER — HYDROMORPHONE HCL PF 1 MG/ML IJ SOLN
0.2500 mg | INTRAMUSCULAR | Status: DC | PRN
  Administered 2012-03-27 (×4): 0.5 mg via INTRAVENOUS

## 2012-03-27 MED ORDER — VECURONIUM BROMIDE 10 MG IV SOLR
INTRAVENOUS | Status: DC | PRN
  Administered 2012-03-27 (×2): 3 mg via INTRAVENOUS

## 2012-03-27 SURGICAL SUPPLY — 70 items
APPLIER CLIP ROT 10 11.4 M/L (STAPLE)
APR CLP MED LRG 11.4X10 (STAPLE)
BANDAGE GAUZE ELAST BULKY 4 IN (GAUZE/BANDAGES/DRESSINGS) ×8 IMPLANT
BLADE SURG 10 STRL SS (BLADE) ×4 IMPLANT
BLADE SURG 15 STRL LF DISP TIS (BLADE) ×3 IMPLANT
BLADE SURG 15 STRL SS (BLADE) ×3
BLADE SURG ROTATE 9660 (MISCELLANEOUS) IMPLANT
CANISTER SUCTION 2500CC (MISCELLANEOUS) ×4 IMPLANT
CATH ROBINSON RED A/P 22FR (CATHETERS) ×4 IMPLANT
CELLS DAT CNTRL 66122 CELL SVR (MISCELLANEOUS) ×3 IMPLANT
CHLORAPREP W/TINT 26ML (MISCELLANEOUS) ×4 IMPLANT
CLIP APPLIE ROT 10 11.4 M/L (STAPLE) IMPLANT
CLOTH BEACON ORANGE TIMEOUT ST (SAFETY) ×4 IMPLANT
COVER SURGICAL LIGHT HANDLE (MISCELLANEOUS) ×4 IMPLANT
DECANTER SPIKE VIAL GLASS SM (MISCELLANEOUS) ×4 IMPLANT
DRAPE PROXIMA HALF (DRAPES) ×4 IMPLANT
DRAPE UTILITY 15X26 W/TAPE STR (DRAPE) ×8 IMPLANT
DRAPE WARM FLUID 44X44 (DRAPE) ×4 IMPLANT
DRSG PAD ABDOMINAL 8X10 ST (GAUZE/BANDAGES/DRESSINGS) ×4 IMPLANT
ELECT CAUTERY BLADE 6.4 (BLADE) ×4 IMPLANT
ELECT REM PT RETURN 9FT ADLT (ELECTROSURGICAL) ×4
ELECTRODE REM PT RTRN 9FT ADLT (ELECTROSURGICAL) ×3 IMPLANT
FILTER SMOKE EVAC LAPAROSHD (FILTER) ×4 IMPLANT
GEL ULTRASOUND 20GR AQUASONIC (MISCELLANEOUS) ×4 IMPLANT
GLOVE EUDERMIC 7 POWDERFREE (GLOVE) ×8 IMPLANT
GOWN PREVENTION PLUS XLARGE (GOWN DISPOSABLE) ×4 IMPLANT
GOWN STRL NON-REIN LRG LVL3 (GOWN DISPOSABLE) ×8 IMPLANT
KIT BASIN OR (CUSTOM PROCEDURE TRAY) ×4 IMPLANT
KIT COLOSTOMY ILEOSTOMY 2.75 (WOUND CARE) ×4 IMPLANT
KIT OSTOMY DRAINABLE 2.75 STR (WOUND CARE) ×4 IMPLANT
KIT ROOM TURNOVER OR (KITS) ×4 IMPLANT
LEGGING LITHOTOMY PAIR STRL (DRAPES) ×4 IMPLANT
LIGASURE IMPACT 36 18CM CVD LR (INSTRUMENTS) IMPLANT
NS IRRIG 1000ML POUR BTL (IV SOLUTION) ×8 IMPLANT
PAD ARMBOARD 7.5X6 YLW CONV (MISCELLANEOUS) ×8 IMPLANT
PENCIL BUTTON HOLSTER BLD 10FT (ELECTRODE) ×4 IMPLANT
RTRCTR WOUND ALEXIS 18CM MED (MISCELLANEOUS) ×4
SCALPEL HARMONIC ACE (MISCELLANEOUS) ×4 IMPLANT
SCISSORS LAP 5X35 DISP (ENDOMECHANICALS) IMPLANT
SET IRRIG TUBING LAPAROSCOPIC (IRRIGATION / IRRIGATOR) IMPLANT
SLEEVE ENDOPATH XCEL 5M (ENDOMECHANICALS) IMPLANT
SPECIMEN JAR X LARGE (MISCELLANEOUS) ×4 IMPLANT
SPONGE GAUZE 4X4 12PLY (GAUZE/BANDAGES/DRESSINGS) ×4 IMPLANT
SPONGE LAP 4X18 X RAY DECT (DISPOSABLE) ×4 IMPLANT
STAPLER VISISTAT 35W (STAPLE) ×4 IMPLANT
SURGILUBE 2OZ TUBE FLIPTOP (MISCELLANEOUS) IMPLANT
SUT PDS II 0 TP-1 LOOPED 60 (SUTURE) IMPLANT
SUT PROLENE 2 0 CT2 30 (SUTURE) ×4 IMPLANT
SUT PROLENE 2 0 KS (SUTURE) IMPLANT
SUT SILK 2 0 (SUTURE) ×4
SUT SILK 2 0 SH CR/8 (SUTURE) ×4 IMPLANT
SUT SILK 2-0 18XBRD TIE 12 (SUTURE) ×3 IMPLANT
SUT SILK 3 0 (SUTURE) ×3
SUT SILK 3 0 SH CR/8 (SUTURE) ×4 IMPLANT
SUT SILK 3-0 18XBRD TIE 12 (SUTURE) ×3 IMPLANT
SUT VIC AB 3-0 SH 18 (SUTURE) ×8 IMPLANT
SYS LAPSCP GELPORT 120MM (MISCELLANEOUS)
SYSTEM LAPSCP GELPORT 120MM (MISCELLANEOUS) IMPLANT
TOWEL OR 17X24 6PK STRL BLUE (TOWEL DISPOSABLE) ×4 IMPLANT
TOWEL OR 17X26 10 PK STRL BLUE (TOWEL DISPOSABLE) ×4 IMPLANT
TRAY FOLEY CATH 14FRSI W/METER (CATHETERS) IMPLANT
TRAY LAPAROSCOPIC (CUSTOM PROCEDURE TRAY) ×4 IMPLANT
TRAY PROCTOSCOPIC FIBER OPTIC (SET/KITS/TRAYS/PACK) ×4 IMPLANT
TROCAR XCEL BLUNT TIP 100MML (ENDOMECHANICALS) IMPLANT
TROCAR XCEL NON-BLD 11X100MML (ENDOMECHANICALS) IMPLANT
TROCAR XCEL NON-BLD 5MMX100MML (ENDOMECHANICALS) ×8 IMPLANT
TUBE CONNECTING 12X1/4 (SUCTIONS) ×4 IMPLANT
TUBING FILTER THERMOFLATOR (ELECTROSURGICAL) ×4 IMPLANT
WATER STERILE IRR 1000ML POUR (IV SOLUTION) ×4 IMPLANT
YANKAUER SUCT BULB TIP NO VENT (SUCTIONS) ×8 IMPLANT

## 2012-03-27 NOTE — Progress Notes (Signed)
Clinical Social Work Department BRIEF PSYCHOSOCIAL ASSESSMENT 03/27/2012  Patient:  Leon Nichols, Leon Nichols     Account Number:  [id number]     Admit date:  03/22/2012  Clinical Social Worker:  Dennison Bulla  Date/Time:  03/27/2012 09:45 AM  Referred by:  Physician  Date Referred:  03/27/2012 Referred for  Crisis Intervention   Other Referral:   Interview type:  Patient Other interview type:    PSYCHOSOCIAL DATA Living Status:  ALONE Admitted from facility:   Level of care:   Primary support name:  Lawanna Kobus Primary support relationship to patient:  SPOUSE Degree of support available:   Adequate    CURRENT CONCERNS Current Concerns  Adjustment to Illness   Other Concerns:    SOCIAL WORK ASSESSMENT / PLAN CSW received consult due to new HIV diagnosis. CSW reviewed chart and met with patient at bedside. No visitors present.    CSW introduced myself and explained role. Patient is currently separated from wife but reports that they have a good relationship. Patient reports that he has supportive network who will assist at dc. Patient reports that he is aware of diagnosis and trying to get affairs in order. Patient reports he has been working to get Medicaid approved and with ID clinic for medication assistance. CSW spoke with financial counselor who is aware of patient will be working with him.    CSW spoke with patient regarding emotional status regarding diagnosis. Patient reports he has not thought about everything to this point. Patient is worried about possible surgery and wants to talk with CSW after surgery. CSW explained community resources and referred patient to [employer]. Patient appreciative of resources but asks for CSW not to write anything down or place on paperwork because he does not want others to ask questions. When asked about individual counseling, patient reports he wants CSW to follow up closer to dc.    CSW updated unit CSW on plans with patient.    Assessment/plan status:  Psychosocial Support/Ongoing Assessment of Needs Other assessment/ plan:   Information/referral to community resources:   [employer] administrator    PATIENT'S/FAMILY'S RESPONSE TO PLAN OF CARE: Patient alert and oriented. Patient appreciative of visit but cannot focus on outpatient follow up at this time. Patient not receptive to discussing feelings at this point and prefers to wait until other matters such as insurance and surgery are determined.

## 2012-03-27 NOTE — Progress Notes (Signed)
Internal Medicine Teaching Service Attending Note Date: 03/27/2012  Patient name: Leon Nichols  Medical record number: [id number]  Date of birth: Jan 23, 1966    This patient has been seen and discussed with the house staff. Please see their note for complete details. I concur with their findings with the following additions/corrections:  [name] was transferred from ICU back to the [facility].   1. Fistulas, perianal - for OR today. On Vanc and zosyn. Dr Jannet Mantis touched vase with Dr Matthias Hughs regarding possible dx of Crohn's but can delay formal GI involvement for now pending OR findings.   2. HIV - CD4 is 170 so meets AIDS dx. Will need PCP prophylaxis. ID is on board and will F/U with pt at D/C.  3. Anemia - Has received 4 unit PRBC. Follow.   Brandyn Thien 03/27/2012, 11:33 AM

## 2012-03-27 NOTE — Progress Notes (Signed)
Please see redundant note from same admission date.

## 2012-03-27 NOTE — Preoperative (Signed)
Beta Blockers   Reason not to administer Beta Blockers:Not Applicable 

## 2012-03-27 NOTE — Consult Note (Signed)
Ostomy consult:  Requested to mark pt for impending colostomy surgery.  Assessed pt while lying and sitting upright.  States he is in too much pain to dangle at bedside or stand.  Deep crease located to lower abd and another near umbilicus; these areas need to be avoided.  Mark placed in left upper abd quad, even with umbilicus and 9 cm to left.  Area within rectus muscles, in line of vision, area free from folds.  Briefly discussed pouching routines and educational materials left at bedside.  No family members present at this time, but he states his wife will assist with pouching activities.  Will assess post-op tomorrow for further ostomy needs.. Will not plan to follow further unless re-consulted.  [address], RN, MSN, [employer]  [phone]

## 2012-03-27 NOTE — Transfer of Care (Signed)
Immediate Anesthesia Transfer of Care Note  Patient: Leon Nichols  Procedure(s) Performed: Procedure(s) (LRB) with comments: DRESSING CHANGE UNDER ANESTHESIA (N/A) LAPAROSCOPIC DIVERTED COLOSTOMY (N/A)  Patient Location: PACU  Anesthesia Type:General  Level of Consciousness: awake, oriented and patient cooperative  Airway & Oxygen Therapy: Patient Spontanous Breathing and Patient connected to nasal cannula oxygen  Post-op Assessment: Report given to PACU RN and Post -op Vital signs reviewed and stable  Post vital signs: Reviewed and stable  Complications: No apparent anesthesia complications

## 2012-03-27 NOTE — Anesthesia Procedure Notes (Signed)
Procedure Name: Intubation Date/Time: 03/27/2012 12:45 PM Performed by: Angelica Pou Pre-anesthesia Checklist: Patient identified, Timeout performed, Emergency Drugs available, Suction available and Patient being monitored Patient Re-evaluated:Patient Re-evaluated prior to inductionOxygen Delivery Method: Circle system utilized Preoxygenation: Pre-oxygenation with 100% oxygen Intubation Type: IV induction Ventilation: Mask ventilation without difficulty and Oral airway inserted - appropriate to patient size Laryngoscope Size: Mac and 4 Grade View: Grade I Tube type: Oral Tube size: 7.5 mm Number of attempts: 1 Airway Equipment and Method: Stylet and Oral airway Placement Confirmation: ETT inserted through vocal cords under direct vision,  positive ETCO2 and breath sounds checked- equal and bilateral Secured at: 22 cm Tube secured with: Tape Dental Injury: Teeth and Oropharynx as per pre-operative assessment

## 2012-03-27 NOTE — Anesthesia Postprocedure Evaluation (Signed)
  Anesthesia Post-op Note  Patient: Leon Nichols  Procedure(s) Performed: Procedure(s) (LRB) with comments: DRESSING CHANGE UNDER ANESTHESIA (N/A) LAPAROSCOPIC DIVERTED COLOSTOMY (N/A)  Patient Location: PACU  Anesthesia Type:General  Level of Consciousness: awake and alert   Airway and Oxygen Therapy: Patient Spontanous Breathing  Post-op Pain: mild  Post-op Assessment: Post-op Vital signs reviewed, Patient's Cardiovascular Status Stable, Respiratory Function Stable, Patent Airway, No signs of Nausea or vomiting and Pain level controlled  Post-op Vital Signs: stable  Complications: No apparent anesthesia complications

## 2012-03-27 NOTE — Progress Notes (Signed)
4 Days Post-Op   Assessment: s/p Procedure(s): IRRIGATION AND DEBRIDEMENT PERIRECTAL ABSCESS Patient Active Problem List  Diagnosis  . HYPERTENSION, BENIGN ESSENTIAL  . Colitis  . Nonspecific (abnormal) findings on radiological and other examination of gastrointestinal tract  . Diverticulosis of colon (without mention of hemorrhage)  . Herpes zoster complicated  . Anemia  . Penile ulcer  . Non-penile genital ulcer  . Perianal abscess and fistula  . Hypotension  . Keratitis  . Septic shock  . HIV (human immunodeficiency virus infection)    Improving s/p extensive debridement of severe perianal abscesses and fistulae Needs colostomy to divert stool.  Plan: Will plan to take to OR later this am to do laparoscopic assisted colostomy, hopefully sigmoid colostomy. I have discussed this with the patient. Await ostomy team to mark. He has no other questions. Told him this hopefully will be temporary (6+ months), but can only be closed if perianal disease is resolved  Subjective: Feels better daily, still moderate pain  Objective: Vital signs in last 24 hours: Temp:  [98.1 F (36.7 C)-99.8 F (37.7 C)] 99.8 F (37.7 C) (12/23 0623) Pulse Rate:  [74-95] 95  (12/23 0623) Resp:  [16-22] 21  (12/23 0623) BP: (89-128)/(40-90) 128/90 mmHg (12/23 0623) SpO2:  [92 %-99 %] 96 % (12/23 0623)   Intake/Output from previous day: 12/22 0701 - 12/23 0700 In: 1550 [I.V.:900; IV Piggyback:650] Out: 805 [Urine:805] Intake/Output this shift:     General appearance: alert, cooperative and mild distress Resp: clear to auscultation bilaterally  Incision: Dressing left in situ - will examine in OR at time of colostomy  Lab Results:   Basename 03/25/12 0500  WBC 8.5  HGB 8.4*  HCT 25.2*  PLT 219   BMET  Basename 03/27/12 0531 03/25/12 0500  NA 130* 131*  K 3.6 3.4*  CL 97 99  CO2 26 22  GLUCOSE 86 99  BUN 3* 5*  CREATININE 0.64 0.72  CALCIUM 8.5 8.3*   PT/INR No results  found for this basename: LABPROT:2,INR:2 in the last 72 hours ABG No results found for this basename: PHART:2,PCO2:2,PO2:2,HCO3:2 in the last 72 hours  MEDS, Scheduled    . antiseptic oral rinse  15 mL Mouth Rinse BID  . piperacillin-tazobactam (ZOSYN)  IV  3.375 g Intravenous Q8H  . sodium chloride  3 mL Intravenous Q12H  . vancomycin  1,000 mg Intravenous Q8H    Studies/Results: No results found.    LOS: 5 days     Currie Paris, MD, [facility] Surgery, [place] [phone]   03/27/2012 7:56 AM

## 2012-03-27 NOTE — OR Nursing (Signed)
Ostomy site oozing blood, MD aware.

## 2012-03-27 NOTE — Progress Notes (Signed)
[facility]  Day #6  of antibiotics  Day #6 vancomycin Day #6 zosyn   Subjective: No new complaints   Antibiotics:  Anti-infectives     Start     Dose/Rate Route Frequency Ordered Stop   03/27/12 1400   emtricitabine-tenofovir (TRUVADA) 200-300 MG per tablet 1 tablet        1 tablet Oral Daily 03/27/12 1044     03/27/12 1400   Dolutegravir Sodium TABS 50 mg        50 mg Oral Daily 03/27/12 1102     03/27/12 1400   metroNIDAZOLE (FLAGYL) tablet 500 mg        500 mg Oral 3 times per day 03/27/12 1217     03/27/12 1230   cefTRIAXone (ROCEPHIN) 2 g in dextrose 5 % 50 mL IVPB        2 g 100 mL/hr over 30 Minutes Intravenous Every 24 hours 03/27/12 1217     03/27/12 1230  sulfamethoxazole-trimethoprim (BACTRIM DS) 800-160 MG per tablet 1 tablet       1 tablet Oral Once per day on Mon Wed Fri 03/27/12 1217     03/23/12 1200   clindamycin (CLEOCIN) IVPB 900 mg  Status:  Discontinued        900 mg 100 mL/hr over 30 Minutes Intravenous 4 times per day 03/23/12 0946 03/24/12 1030   03/23/12 1030   piperacillin-tazobactam (ZOSYN) IVPB 3.375 g  Status:  Discontinued        3.375 g 12.5 mL/hr over 240 Minutes Intravenous 3 times per day 03/23/12 0922 03/27/12 1216   03/23/12 1030   vancomycin (VANCOCIN) IVPB 1000 mg/200 mL premix        1,000 mg 200 mL/hr over 60 Minutes Intravenous Every 8 hours 03/23/12 0922     03/23/12 0000   acyclovir (ZOVIRAX) tablet 400 mg  Status:  Discontinued        400 mg Oral 5 times daily 03/22/12 2359 03/23/12 1039   03/22/12 1845   vancomycin (VANCOCIN) IVPB 1000 mg/200 mL premix        1,000 mg 200 mL/hr over 60 Minutes Intravenous  Once 03/22/12 1836 03/22/12 2237   03/22/12 1845   piperacillin-tazobactam (ZOSYN) IVPB 3.375 g        3.375 g 12.5 mL/hr over 240 Minutes Intravenous  Once 03/22/12 1836 03/23/12 0015          Medications: Scheduled Meds:   . [facility] HOLD] antiseptic oral rinse  15 mL Mouth Rinse  BID  . cefTRIAXone (ROCEPHIN)  IV  2 g Intravenous Q24H  . [MAR HOLD] Dolutegravir Sodium  50 mg Oral Daily  . [facility] HOLD] emtricitabine-tenofovir  1 tablet Oral Daily  . metroNIDAZOLE  500 mg Oral Q8H  . [MAR HOLD] sodium chloride  3 mL Intravenous Q12H  . sulfamethoxazole-trimethoprim  1 tablet Oral 3 times weekly  . [facility] HOLD] vancomycin  1,000 mg Intravenous Q8H   Continuous Infusions:   . 0.9 % NaCl with KCl 40 mEq / L 50 mL/hr (03/25/12 1223)  . lactated ringers 50 mL/hr at 03/27/12 1141   PRN Meds:.[MAR HOLD] acetaminophen, [MAR HOLD] fentaNYL, [MAR HOLD] ondansetron (ZOFRAN) IV, [MAR HOLD] oxyCODONE, [MAR HOLD] temazepam   Objective: Weight change:   Intake/Output Summary (Last 24 hours) at 03/27/12 1240 Last data filed at 03/27/12 1100  Gross per 24 hour  Intake    850 ml  Output    850 ml  Net  0 ml   Blood pressure 135/87, pulse 93, temperature 98.3 F (36.8 C), temperature source Oral, resp. rate 18, height 6' 0.84" (1.85 m), weight 208 lb 8.9 oz (94.6 kg), SpO2 99.00%. Temp:  [98.2 F (36.8 C)-99.8 F (37.7 C)] 98.3 F (36.8 C) (12/23 1100) Pulse Rate:  [85-95] 93  (12/23 1100) Resp:  [16-21] 18  (12/23 1100) BP: (92-135)/(40-90) 135/87 mmHg (12/23 1100) SpO2:  [95 %-99 %] 99 % (12/23 1100)  Physical Exam: General: Alert and awake, oriented x3, not in any acute distress. HEENT: anicteric sclera,  CVS regular rate, normal r,  no murmur rubs or gallops Chest: clear to auscultation bilaterally, no wheezing, rales or rhonchi Abdomen: soft nontender, nondistended, normal bowel sounds, Extremities: no  clubbing or edema noted bilaterally Skin: perineal area with purluent material from fistula Neuro: nonfocal  Lab Results:  Basename 03/27/12 0900 03/25/12 0500  WBC 7.9 8.5  HGB 8.2* 8.4*  HCT 24.8* 25.2*  PLT 307 219    BMET  Basename 03/27/12 0531 03/25/12 0500  NA 130* 131*  K 3.6 3.4*  CL 97 99  CO2 26 22  GLUCOSE 86 99  BUN 3* 5*    CREATININE 0.64 0.72  CALCIUM 8.5 8.3*    Micro Results: Recent Results (from the past 240 hour(s))  CULTURE, BLOOD (ROUTINE X 2)     Status: Normal (Preliminary result)   Collection Time   03/22/12  7:10 PM      Component Value Range Status Comment   Specimen Description BLOOD ARM LEFT   Final    Special Requests     Final    Value: BOTTLES DRAWN AEROBIC AND ANAEROBIC AERO 10CC,ANAE 5CC   Culture  Setup Time 03/23/2012 04:02   Final    Culture     Final    Value:        BLOOD CULTURE RECEIVED NO GROWTH TO DATE CULTURE WILL BE HELD FOR 5 DAYS BEFORE ISSUING A FINAL NEGATIVE REPORT   Report Status PENDING   Incomplete   CULTURE, BLOOD (ROUTINE X 2)     Status: Normal (Preliminary result)   Collection Time   03/22/12  7:27 PM      Component Value Range Status Comment   Specimen Description BLOOD ARM LEFT   Final    Special Requests BOTTLES DRAWN AEROBIC AND ANAEROBIC 10CC   Final    Culture  Setup Time 03/23/2012 04:02   Final    Culture     Final    Value:        BLOOD CULTURE RECEIVED NO GROWTH TO DATE CULTURE WILL BE HELD FOR 5 DAYS BEFORE ISSUING A FINAL NEGATIVE REPORT   Report Status PENDING   Incomplete   WOUND CULTURE     Status: Normal   Collection Time   03/23/12  2:13 AM      Component Value Range Status Comment   Specimen Description WOUND PENIS   Final    Special Requests Normal   Final    Gram Stain     Final    Value: NO WBC SEEN     FEW SQUAMOUS EPITHELIAL CELLS PRESENT     FEW GRAM NEGATIVE COCCOBACILLI     FEW GRAM POSITIVE COCCI IN PAIRS     RARE GRAM NEGATIVE RODS   Culture     Final    Value: MULTIPLE ORGANISMS PRESENT, NONE PREDOMINANT     Note: NO STAPHYLOCOCCUS AUREUS ISOLATED NO GROUP A STREP (S.PYOGENES) ISOLATED  Report Status 03/26/2012 FINAL   Final   EYE CULTURE     Status: Normal   Collection Time   03/23/12  2:13 AM      Component Value Range Status Comment   Specimen Description EYE   Final    Special Requests Normal   Final     Culture NO GROWTH 2 DAYS   Final    Report Status 03/25/2012 FINAL   Final   MRSA PCR SCREENING     Status: Normal   Collection Time   03/23/12  4:50 PM      Component Value Range Status Comment   MRSA by PCR NEGATIVE  NEGATIVE Final   GC/CHLAMYDIA PROBE AMP     Status: Normal   Collection Time   03/23/12  7:33 PM      Component Value Range Status Comment   CT Probe RNA NEGATIVE  NEGATIVE Final    GC Probe RNA NEGATIVE  NEGATIVE Final     Studies/Results: No results found.    Assessment/Plan: Leon Nichols is a 46 y.o. malewith newly diagnosed HIV likely AIDS with perirectal abscesses and rectal fistulas   #1 perirectal abscessesdisease with rectal fistulas:  Pt going to OR today for diverting colostomy --given no specific organism such as Pseudomonas isolated on culture will simplify his abx to : --vancomycin, rocephin and oral flagyl   #2 HIV and likely AIDS: I will start him on Tivicay and truvada His wife is STILl not to my knowledge aware of her dx so care with discussing topic of his HIV positivity with her present Needs bactrim and will start this thrice weekly for PCP prophlaxis   He will need to followup with [place] when he leaves the hospital. It has been my understanding that he actually DOES have medicaid but would want [employer] to triple check this and make sure he actually DOES have this because IF he doesn't THEN he needs ADAP paperwork processed to help him get cvoerage for his ARVs.    LOS: 5 days   Acey Lav 03/27/2012, 12:40 PM

## 2012-03-27 NOTE — Anesthesia Preprocedure Evaluation (Addendum)
Anesthesia Evaluation  Patient identified by MRN, date of birth, ID band Patient awake    Reviewed: Allergy & Precautions, H&P , NPO status , Patient's Chart, lab work & pertinent test results  Airway Mallampati: II TM Distance: >3 FB Neck ROM: Full    Dental  (+) Dental Advisory Given and Teeth Intact   Pulmonary COPDCurrent Smoker,  + rhonchi         Cardiovascular hypertension, Pt. on medications Rhythm:Regular Rate:Normal     Neuro/Psych    GI/Hepatic (+)     substance abuse  cocaine use and marijuana use,   Endo/Other    Renal/GU      Musculoskeletal   Abdominal   Peds  Hematology  (+) HIV,   Anesthesia Other Findings   Reproductive/Obstetrics                          Anesthesia Physical Anesthesia Plan  ASA: III  Anesthesia Plan: General   Post-op Pain Management:    Induction: Intravenous  Airway Management Planned: Oral ETT  Additional Equipment:   Intra-op Plan:   Post-operative Plan: Extubation in OR and Possible Post-op intubation/ventilation  Informed Consent: I have reviewed the patients History and Physical, chart, labs and discussed the procedure including the risks, benefits and alternatives for the proposed anesthesia with the patient or authorized representative who has indicated his/her understanding and acceptance.     Plan Discussed with: CRNA and Surgeon  Anesthesia Plan Comments:         Anesthesia Quick Evaluation

## 2012-03-27 NOTE — Op Note (Signed)
Leon Nichols 07-04-1965 [id number] 03/22/2012  Preoperative diagnosis: status post drainage of multiple perineal perianal and pararectal abscesses  Postoperative diagnosis: same  Procedure: laparoscopic assisted sigmoid colostomy  Surgeon: Currie Paris, MD, FACS  Anesthesia: General   Clinical History and Indications: this patient presented a few days ago and was taken to the operating room with multiple perineal perianal perirectal abscess these which were opened and drained and debrided. It was felt that he would benefit from a colostomy to reduce his stool contamination to allow this area to heal with plans that this would be temporary ascending that we will get complete healing. Description of Procedure: I saw the patient in the preoperative area and reviewed the plans again. He was taken to the operating room and after satisfactory general endotracheal anesthesia was obtained a Foley catheter was placed abdomen prepped and draped and a timeout done.  Using an Optiview 5 mm in the abdominal cavity under direct vision in the right lower quadrant. After the abdomen was insufflated it put another 5 mm in the left lower quadrant colostomy site. The camera therapy we third 5 mm in the right upper quadrant.  I was able to visualize the sigmoid colon it appeared to be mobile but little bit attached laterally. It is a Harmonic to free it up so that I had excellent mobility to bring out into the left lower quadrant. I put a grasper on the sigmoid or a plan to bring it out.  I then made a circular incision at the spot marked preoperatively, divided down to the fascia which was opened in a transverse and vertical fashion, spread the muscle and entered the peritoneal cavity. I stretched his incision to 2+ fingers in depth and width.  A Babcock was placed on the sigmoid colon where had placed the previous grasper through the 5 mm trocar and this mobilized up into the wound. I opened the  peritoneum at the mesentery and was able to thread a red rubber catheter under this to supportive and appeared to come out easily with no tension pulling it back. I then opened along the antimesenteric border and matured it was multiple sutures of 3-0 Vicryl. The rubber catheter was secured with 2-0 Prolene's.  Mild check was made with the camera the lateral ports and this appeared to be in good position with no problems. It was deflated and the lateral trochars removed. The incisions were closed with 3-0 Vicryl and Dermabond. Colostomy bag was applied.  I did place the patient some serotonin change distressing sense I thought to get better examination of all the wounds while he was under anesthesia did not change the dressings this morning. He has multiple open tracts, so we still have periodic liquid drainage coming out on him which was somewhat greenish. I checked the sure there were no undrained pockets and redressed it.  Patient tolerated the procedure well. There were no complications and counts were correct. There was minimal blood loss. Currie Paris, MD, FACS 03/27/2012 2:25 PM

## 2012-03-28 ENCOUNTER — Encounter (HOSPITAL_COMMUNITY): Payer: Self-pay | Admitting: Surgery

## 2012-03-28 DIAGNOSIS — R6520 Severe sepsis without septic shock: Secondary | ICD-10-CM

## 2012-03-28 DIAGNOSIS — Z21 Asymptomatic human immunodeficiency virus [HIV] infection status: Secondary | ICD-10-CM

## 2012-03-28 DIAGNOSIS — A419 Sepsis, unspecified organism: Secondary | ICD-10-CM

## 2012-03-28 DIAGNOSIS — R6521 Severe sepsis with septic shock: Secondary | ICD-10-CM

## 2012-03-28 LAB — CBC
HCT: 27 % — ABNORMAL LOW (ref 39.0–52.0)
Hemoglobin: 8.8 g/dL — ABNORMAL LOW (ref 13.0–17.0)
MCH: 26.5 pg (ref 26.0–34.0)
MCHC: 32.6 g/dL (ref 30.0–36.0)
MCV: 81.3 fL (ref 78.0–100.0)

## 2012-03-28 LAB — CMV (CYTOMEGALOVIRUS) DNA ULTRAQUANT, PCR

## 2012-03-28 LAB — BASIC METABOLIC PANEL
BUN: 3 mg/dL — ABNORMAL LOW (ref 6–23)
Calcium: 8.5 mg/dL (ref 8.4–10.5)
Creatinine, Ser: 0.57 mg/dL (ref 0.50–1.35)
GFR calc non Af Amer: >90 mL/min (ref >90)
Glucose, Bld: 82 mg/dL (ref 70–99)
Sodium: 132 mEq/L — ABNORMAL LOW (ref 135–145)

## 2012-03-28 MED ORDER — KETOROLAC TROMETHAMINE 30 MG/ML IJ SOLN
30.0000 mg | Freq: Once | INTRAMUSCULAR | Status: AC
  Administered 2012-03-28: 30 mg via INTRAVENOUS
  Filled 2012-03-28: qty 1

## 2012-03-28 MED ORDER — KETOROLAC TROMETHAMINE 15 MG/ML IJ SOLN
15.0000 mg | Freq: Four times a day (QID) | INTRAMUSCULAR | Status: DC
  Administered 2012-03-28 – 2012-03-29 (×3): 15 mg via INTRAVENOUS
  Filled 2012-03-28 (×6): qty 1

## 2012-03-28 NOTE — Progress Notes (Addendum)
Subjective: Leon Nichols was seen and examined at bedside.  He is s/p diverting colostomy yesterday and POD #5 from I&D of perianal abscesses.  He is complaining of pain at the site of his colostomy but otherwise says he is feeling okay. He still has not informed his wife of his HIV status and says that he plans to do so when he is out of the hospital and with a pastor as well.  He currently denies any N/V/D, chest pain, shortness of breath, or any urinary complaints at this time.  He has not passed any gas or BM yet.   Objective: Vital signs in last 24 hours: Filed Vitals:   03/27/12 1500 03/27/12 1540 03/27/12 1844 03/27/12 2304  BP:  112/68 112/60 120/69  Pulse:  101 91 90  Temp:  98.4 F (36.9 C) 99.5 F (37.5 C) 98.8 F (37.1 C)  TempSrc:    Oral  Resp:  20 18 18   Height:      Weight:      SpO2: 100% 95% 100% 100%   Weight change:   Intake/Output Summary (Last 24 hours) at 03/28/12 0758 Last data filed at 03/28/12 0600  Gross per 24 hour  Intake   2503 ml  Output   1975 ml  Net    528 ml   Vitals reviewed.  General: resting in bed, NAD  HEENT: PERRLA, EOMI, no scleral icterus  Cardiac: RRR, no rubs, murmurs or gallops  Pulm: clear to auscultation bilaterally, no wheezes, rales, or rhonchi  Abd: soft, nontender, nondistended, BS hypoactive, colostomy in place Ext: warm and well perfused, no pedal edema, +2 dp b/l  Neuro: alert and oriented X3, cranial nerves II-XII grossly intact, strength and sensation to light touch equal in bilateral upper and lower extremities  Skin: + papules on b/l upper extremities. Dry dressing in place over penis/scrotal region and perianal region, - blood.  Lab Results: Basic Metabolic Panel:  Lab 03/28/12 1610 03/27/12 0531  NA 132* 130*  K 3.5 3.6  CL 97 97  CO2 27 26  GLUCOSE 82 86  BUN 3* 3*  CREATININE 0.57 0.64  CALCIUM 8.5 8.5  MG -- --  PHOS -- --   Liver Function Tests:  Lab 03/24/12 0500 03/23/12 1039  AST 13 10  ALT <5  <5  ALKPHOS 47 47  BILITOT 1.1 0.5  PROT 6.1 6.3  ALBUMIN 1.7* 1.8*   CBC:  Lab 03/28/12 0500 03/27/12 0900 03/23/12 1039  WBC 7.4 7.9 --  NEUTROABS -- 6.2 7.7  HGB 8.8* 8.2* --  HCT 27.0* 24.8* --  MCV 81.3 80.8 --  PLT 297 307 --   Hemoglobin A1C:  Lab 03/23/12 1039  HGBA1C 6.2*   Coagulation:  Lab 03/23/12 1039  LABPROT 16.4*  INR 1.35   Anemia Panel:  Lab 03/24/12 1130 03/22/12 2037 03/22/12 1856  VITAMINB12 -- 448 --  FOLATE 7.0 -- --  FERRITIN -- 1317* --  TIBC -- 186* --  IRON -- 11* --  RETICCTPCT -- -- 1.0   Urine Drug Screen: Drugs of Abuse     Component Value Date/Time   LABOPIA POSITIVE* 03/23/2012 0737   COCAINSCRNUR NONE DETECTED 03/23/2012 0737   LABBENZ NONE DETECTED 03/23/2012 0737   AMPHETMU NONE DETECTED 03/23/2012 0737   THCU NONE DETECTED 03/23/2012 0737   LABBARB NONE DETECTED 03/23/2012 0737    Urinalysis:  Lab 03/23/12 0214  COLORURINE YELLOW  LABSPEC 1.020  PHURINE 5.0  GLUCOSEU NEGATIVE  HGBUR NEGATIVE  BILIRUBINUR NEGATIVE  KETONESUR NEGATIVE  PROTEINUR NEGATIVE  UROBILINOGEN 1.0  NITRITE NEGATIVE  LEUKOCYTESUR MODERATE*   Micro Results: Recent Results (from the past 240 hour(s))  CULTURE, BLOOD (ROUTINE X 2)     Status: Normal (Preliminary result)   Collection Time   03/22/12  7:10 PM      Component Value Range Status Comment   Specimen Description BLOOD ARM LEFT   Final    Special Requests     Final    Value: BOTTLES DRAWN AEROBIC AND ANAEROBIC AERO 10CC,ANAE 5CC   Culture  Setup Time 03/23/2012 04:02   Final    Culture     Final    Value:        BLOOD CULTURE RECEIVED NO GROWTH TO DATE CULTURE WILL BE HELD FOR 5 DAYS BEFORE ISSUING A FINAL NEGATIVE REPORT   Report Status PENDING   Incomplete   CULTURE, BLOOD (ROUTINE X 2)     Status: Normal (Preliminary result)   Collection Time   03/22/12  7:27 PM      Component Value Range Status Comment   Specimen Description BLOOD ARM LEFT   Final    Special Requests  BOTTLES DRAWN AEROBIC AND ANAEROBIC 10CC   Final    Culture  Setup Time 03/23/2012 04:02   Final    Culture     Final    Value:        BLOOD CULTURE RECEIVED NO GROWTH TO DATE CULTURE WILL BE HELD FOR 5 DAYS BEFORE ISSUING A FINAL NEGATIVE REPORT   Report Status PENDING   Incomplete   WOUND CULTURE     Status: Normal   Collection Time   03/23/12  2:13 AM      Component Value Range Status Comment   Specimen Description WOUND PENIS   Final    Special Requests Normal   Final    Gram Stain     Final    Value: NO WBC SEEN     FEW SQUAMOUS EPITHELIAL CELLS PRESENT     FEW GRAM NEGATIVE COCCOBACILLI     FEW GRAM POSITIVE COCCI IN PAIRS     RARE GRAM NEGATIVE RODS   Culture     Final    Value: MULTIPLE ORGANISMS PRESENT, NONE PREDOMINANT     Note: NO STAPHYLOCOCCUS AUREUS ISOLATED NO GROUP A STREP (S.PYOGENES) ISOLATED   Report Status 03/26/2012 FINAL   Final   EYE CULTURE     Status: Normal   Collection Time   03/23/12  2:13 AM      Component Value Range Status Comment   Specimen Description EYE   Final    Special Requests Normal   Final    Culture NO GROWTH 2 DAYS   Final    Report Status 03/25/2012 FINAL   Final   MRSA PCR SCREENING     Status: Normal   Collection Time   03/23/12  4:50 PM      Component Value Range Status Comment   MRSA by PCR NEGATIVE  NEGATIVE Final   GC/CHLAMYDIA PROBE AMP     Status: Normal   Collection Time   03/23/12  7:33 PM      Component Value Range Status Comment   CT Probe RNA NEGATIVE  NEGATIVE Final    GC Probe RNA NEGATIVE  NEGATIVE Final    Medications: I have reviewed the patient's current medications. Scheduled Meds:   . antiseptic oral rinse  15 mL Mouth Rinse BID  . cefTRIAXone (  ROCEPHIN)  IV  2 g Intravenous Q24H  . Dolutegravir Sodium  50 mg Oral Daily  . emtricitabine-tenofovir  1 tablet Oral Daily  . heparin subcutaneous  5,000 Units Subcutaneous Q8H  . metroNIDAZOLE  500 mg Oral Q8H  . sodium chloride  3 mL Intravenous Q12H  .  sulfamethoxazole-trimethoprim  1 tablet Oral 3 times weekly  . vancomycin  1,000 mg Intravenous Q8H   Continuous Infusions:   . 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   Perianal abscess and fistula--POD#5 s/p surgical exploration and Irrigation and debridement. S/p diverting colostomy 03/27/12 -wound cultures: multiple organisms present, none predominant, no staph aureus or group a strep isolated  -ID following--continue Vancomycin--day 7, Rocephin day 2, and oral flagyl day 2--total day of abx 7.    -surgery following--diverting colostomy yesterday  -spoke with Dr. Matthias Hughs from GI yesterday, will await surgery findings. May consider CT enterography in the future for further investigation. Would hold on serological testing at this time and correlate clinically. Will formally consult with further information or any changes.   Anemia--Hb 7.4 on admission. Transfused total of 4 units PRBCs during hospital course thus far. Stable s/p surgery.  -Hb 8.8 03/28/12  -continue to monitor   Hypotension--improved. Holding home dose lisinopril  -IVF  -continue to monitor   Septic shock--resolved. Was under care of PCCM, transferred back to IMTS 03/27/12.   HIV (human immunodeficiency virus infection)--new diagnosis this admission. HIV antibody reactive + 03/23/12. HIV 1 RNA Quant: 61754 and CD4 count 170 -ID following: will start Tivicay and Truvada and bactrim for PCP Prophylaxis per Dr. 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 Hx of Herpes Zoster 09/16/11 complicated by Ramsey-Hunt  Syndrome.  -continue to monitor   Diet: NPO   Dvt Ppx: Heparin  Dispo: Disposition is deferred at this time, awaiting improvement of current medical problems.  The patient does have a current PCP (MARTIN,NYKEDTRA, NP), therefore will not be requiring OPC follow-up after discharge.  The patient does not know have transportation limitations that hinder transportation to clinic appointments.  .Services Needed at time of discharge: Y = Yes, Blank = No PT:   OT:   RN:   Equipment:   Other:     LOS: 6 days   Darden Palmer 03/28/2012, 7:58 AM

## 2012-03-28 NOTE — Consult Note (Signed)
WOC ostomy consult  Stoma type/location:  Colostomy surgery to left lower quad yesterday. Stomal assessment/size: Stoma red and viable, 2 inches, located in deep valley, flush with skin level. Peristomal assessment:  Surrounding skin intact Output  Small amt bloody drainage, no stool or flatus. Ostomy pouching:Pt has 2 piece appliance which was applied in the OR and is currently leaking. Education provided: Demonstrated pouch change using barrier ring to maintain seal and one piece flexible pouch.  Pt able to open and close velcro to empty.  No family members at bedside for teaching session.  Educational materials left in room.  Supplies ordered for bedside staff use.  Pt asked appropriate questions and watched demonstration; participation was limited R/T 1st post-op day and pain.  Cammie Mcgee, RN, MSN, [employer]  [phone]

## 2012-03-28 NOTE — Progress Notes (Signed)
1 Day Post-Op   Assessment: s/p Procedure(s): DRESSING CHANGE UNDER ANESTHESIA LAPAROSCOPIC DIVERTED COLOSTOMY Patient Active Problem List  Diagnosis  . HYPERTENSION, BENIGN ESSENTIAL  . Colitis  . Nonspecific (abnormal) findings on radiological and other examination of gastrointestinal tract  . Diverticulosis of colon (without mention of hemorrhage)  . Herpes zoster complicated  . Anemia  . Penile ulcer  . Non-penile genital ulcer  . Perianal abscess and fistula  . Hypotension  . Keratitis  . Septic shock  . HIV (human immunodeficiency virus infection)    Stable s/p colostomy and s/p drainage of extensive peri-recatal abscesses and fistulae  Plan: d/c foley Will add tordol to pain meds. need to start some more wound care  Subjective: Pain mainly at ostomy site, no nausea.   Objective: Vital signs in last 24 hours: Temp:  [98.3 F (36.8 C)-99.5 F (37.5 C)] 98.8 F (37.1 C) (12/23 2304) Pulse Rate:  [90-101] 90  (12/23 2304) Resp:  [18-20] 18  (12/23 2304) BP: (112-135)/(60-87) 120/69 mmHg (12/23 2304) SpO2:  [95 %-100 %] 100 % (12/23 2304)   Intake/Output from previous day: 12/23 0701 - 12/24 0700 In: 2503 [I.V.:2503] Out: 1975 [Urine:1950; Blood:25] Intake/Output this shift:     General appearance: alert, cooperative and mild distress Resp: clear to auscultation bilaterally GI: Soft, tender at ostomy site. ostomy appears viable  Incision: purulent drainage on dressings  Lab Results:   Basename 03/28/12 0500 03/27/12 0900  WBC 7.4 7.9  HGB 8.8* 8.2*  HCT 27.0* 24.8*  PLT 297 307   BMET  Basename 03/28/12 0500 03/27/12 0531  NA 132* 130*  K 3.5 3.6  CL 97 97  CO2 27 26  GLUCOSE 82 86  BUN 3* 3*  CREATININE 0.57 0.64  CALCIUM 8.5 8.5   PT/INR No results found for this basename: LABPROT:2,INR:2 in the last 72 hours ABG No results found for this basename: PHART:2,PCO2:2,PO2:2,HCO3:2 in the last 72 hours  MEDS, Scheduled    .  antiseptic oral rinse  15 mL Mouth Rinse BID  . cefTRIAXone (ROCEPHIN)  IV  2 g Intravenous Q24H  . Dolutegravir Sodium  50 mg Oral Daily  . emtricitabine-tenofovir  1 tablet Oral Daily  . heparin subcutaneous  5,000 Units Subcutaneous Q8H  . metroNIDAZOLE  500 mg Oral Q8H  . sodium chloride  3 mL Intravenous Q12H  . sulfamethoxazole-trimethoprim  1 tablet Oral 3 times weekly  . vancomycin  1,000 mg Intravenous Q8H    Studies/Results: No results found.    LOS: 6 days     Currie Paris, MD, [facility], [place] [phone]   03/28/2012 9:31 AM

## 2012-03-28 NOTE — Progress Notes (Signed)
[facility]   Day #7 of antibiotics  Day #7 vancomycin dAY # 2 ROCEPHIN DAY 3 2 FLAGYL  6 Days of zosyn   Subjective: SP surgery   Antibiotics:  Anti-infectives     Start     Dose/Rate Route Frequency Ordered Stop   03/27/12 1400   emtricitabine-tenofovir (TRUVADA) 200-300 MG per tablet 1 tablet        1 tablet Oral Daily 03/27/12 1044     03/27/12 1400   Dolutegravir Sodium TABS 50 mg        50 mg Oral Daily 03/27/12 1102     03/27/12 1400   cefTRIAXone (ROCEPHIN) 2 g in dextrose 5 % 50 mL IVPB        2 g 100 mL/hr over 30 Minutes Intravenous Every 24 hours 03/27/12 1217     03/27/12 1400   metroNIDAZOLE (FLAGYL) tablet 500 mg        500 mg Oral 3 times per day 03/27/12 1217     03/27/12 1300  sulfamethoxazole-trimethoprim (BACTRIM DS) 800-160 MG per tablet 1 tablet       1 tablet Oral Once per day on Mon Wed Fri 03/27/12 1217     03/23/12 1200   clindamycin (CLEOCIN) IVPB 900 mg  Status:  Discontinued        900 mg 100 mL/hr over 30 Minutes Intravenous 4 times per day 03/23/12 0946 03/24/12 1030   03/23/12 1030   piperacillin-tazobactam (ZOSYN) IVPB 3.375 g  Status:  Discontinued        3.375 g 12.5 mL/hr over 240 Minutes Intravenous 3 times per day 03/23/12 0922 03/27/12 1216   03/23/12 1030   vancomycin (VANCOCIN) IVPB 1000 mg/200 mL premix        1,000 mg 200 mL/hr over 60 Minutes Intravenous Every 8 hours 03/23/12 0922     03/23/12 0000   acyclovir (ZOVIRAX) tablet 400 mg  Status:  Discontinued        400 mg Oral 5 times daily 03/22/12 2359 03/23/12 1039   03/22/12 1845   vancomycin (VANCOCIN) IVPB 1000 mg/200 mL premix        1,000 mg 200 mL/hr over 60 Minutes Intravenous  Once 03/22/12 1836 03/22/12 2237   03/22/12 1845   piperacillin-tazobactam (ZOSYN) IVPB 3.375 g        3.375 g 12.5 mL/hr over 240 Minutes Intravenous  Once 03/22/12 1836 03/23/12 0015          Medications: Scheduled Meds:    . antiseptic oral  rinse  15 mL Mouth Rinse BID  . cefTRIAXone (ROCEPHIN)  IV  2 g Intravenous Q24H  . Dolutegravir Sodium  50 mg Oral Daily  . emtricitabine-tenofovir  1 tablet Oral Daily  . heparin subcutaneous  5,000 Units Subcutaneous Q8H  . ketorolac  15 mg Intravenous Q6H  . metroNIDAZOLE  500 mg Oral Q8H  . sodium chloride  3 mL Intravenous Q12H  . sulfamethoxazole-trimethoprim  1 tablet Oral 3 times weekly  . vancomycin  1,000 mg Intravenous Q8H   Continuous Infusions:    . 0.9 % NaCl with KCl 40 mEq / L 100 mL/hr at 03/28/12 1401   PRN Meds:.acetaminophen, fentaNYL, ondansetron (ZOFRAN) IV, oxyCODONE, sodium chloride, temazepam   Objective: Weight change:   Intake/Output Summary (Last 24 hours) at 03/28/12 1522 Last data filed at 03/28/12 0800  Gross per 24 hour  Intake   1003 ml  Output    900 ml  Net    103 ml   Blood pressure 115/79, pulse 99, temperature 98.6 F (37 C), temperature source Oral, resp. rate 18, height 6' 0.84" (1.85 m), weight 208 lb 8.9 oz (94.6 kg), SpO2 98.00%. Temp:  [98.4 F (36.9 C)-99.5 F (37.5 C)] 98.6 F (37 C) (12/24 1035) Pulse Rate:  [90-101] 99  (12/24 1035) Resp:  [18-20] 18  (12/24 1035) BP: (112-120)/(60-79) 115/79 mmHg (12/24 1035) SpO2:  [95 %-100 %] 98 % (12/24 1035)  Physical Exam: General: Alert and awake, oriented x3, not in any acute distress. HEENT: anicteric sclera,  CVS regular rate, normal r,  no murmur rubs or gallops Chest: clear to auscultation bilaterally, no wheezing, rales or rhonchi Abdomen: soft nontender ostomy in place Extremities: no  clubbing or edema noted bilaterally Skin: perineal area with purluent material from fistula Neuro: nonfocal  Lab Results:  Basename 03/28/12 0500 03/27/12 0900  WBC 7.4 7.9  HGB 8.8* 8.2*  HCT 27.0* 24.8*  PLT 297 307    BMET  Basename 03/28/12 0500 03/27/12 0531  NA 132* 130*  K 3.5 3.6  CL 97 97  CO2 27 26  GLUCOSE 82 86  BUN 3* 3*  CREATININE 0.57 0.64  CALCIUM 8.5  8.5    Micro Results: Recent Results (from the past 240 hour(s))  CULTURE, BLOOD (ROUTINE X 2)     Status: Normal (Preliminary result)   Collection Time   03/22/12  7:10 PM      Component Value Range Status Comment   Specimen Description BLOOD ARM LEFT   Final    Special Requests     Final    Value: BOTTLES DRAWN AEROBIC AND ANAEROBIC AERO 10CC,ANAE 5CC   Culture  Setup Time 03/23/2012 04:02   Final    Culture     Final    Value:        BLOOD CULTURE RECEIVED NO GROWTH TO DATE CULTURE WILL BE HELD FOR 5 DAYS BEFORE ISSUING A FINAL NEGATIVE REPORT   Report Status PENDING   Incomplete   CULTURE, BLOOD (ROUTINE X 2)     Status: Normal (Preliminary result)   Collection Time   03/22/12  7:27 PM      Component Value Range Status Comment   Specimen Description BLOOD ARM LEFT   Final    Special Requests BOTTLES DRAWN AEROBIC AND ANAEROBIC 10CC   Final    Culture  Setup Time 03/23/2012 04:02   Final    Culture     Final    Value:        BLOOD CULTURE RECEIVED NO GROWTH TO DATE CULTURE WILL BE HELD FOR 5 DAYS BEFORE ISSUING A FINAL NEGATIVE REPORT   Report Status PENDING   Incomplete   WOUND CULTURE     Status: Normal   Collection Time   03/23/12  2:13 AM      Component Value Range Status Comment   Specimen Description WOUND PENIS   Final    Special Requests Normal   Final    Gram Stain     Final    Value: NO WBC SEEN     FEW SQUAMOUS EPITHELIAL CELLS PRESENT     FEW GRAM NEGATIVE COCCOBACILLI     FEW GRAM POSITIVE COCCI IN PAIRS     RARE GRAM NEGATIVE RODS   Culture     Final    Value: MULTIPLE ORGANISMS PRESENT, NONE PREDOMINANT     Note: NO STAPHYLOCOCCUS AUREUS ISOLATED NO GROUP A STREP (  S.PYOGENES) ISOLATED   Report Status 03/26/2012 FINAL   Final   EYE CULTURE     Status: Normal   Collection Time   03/23/12  2:13 AM      Component Value Range Status Comment   Specimen Description EYE   Final    Special Requests Normal   Final    Culture NO GROWTH 2 DAYS   Final    Report  Status 03/25/2012 FINAL   Final   MRSA PCR SCREENING     Status: Normal   Collection Time   03/23/12  4:50 PM      Component Value Range Status Comment   MRSA by PCR NEGATIVE  NEGATIVE Final   GC/CHLAMYDIA PROBE AMP     Status: Normal   Collection Time   03/23/12  7:33 PM      Component Value Range Status Comment   CT Probe RNA NEGATIVE  NEGATIVE Final    GC Probe RNA NEGATIVE  NEGATIVE Final     Studies/Results: No results found.    Assessment/Plan: Leon Nichols is a 46 y.o. malewith newly diagnosed HIV likely AIDS with perirectal abscesses and rectal fistulas   #1 perirectal abscessesdisease with rectal fistulas: Sp diverting colostomy  no specific organism such as Pseudomonas isolated on culture will we simplified his abx to --vancomycin, rocephin and oral flagyl --would narrow further to rocephin and flagyl while the patient is in the hospital and then as he nears discharge to SNF vs home would consider changing him to an oral regimen such as augmenting 875/125mg  bid until he can followup in our clinic   #2 HIV and likely AIDS: Started him on Tivicay and truvada Needs bactrim and will start this thrice weekly for PCP prophlaxis  On talking with him today I am worried that he does not have medicaid and he is certainly NOT enrolled with ADAP  I WOULD MAKE SURE CM WORKING ON HIS MEDICAID APPLICATION  KIM POFF (office number [phone] and cell phone [phone] our bridge counselor with [facility] will be back on January the 3rd and could help with getting the patient ADAP IF HE CANNOT GET MEDICAID  I DO NOT THINK HE SHOULD BE DC FROM THE HOSPITAL UNTIL PAYOR SOURCE FOR HIS [facility] AND REST OF HIS COMPLICATED CARE (INCLUDING WOUND CARE, OSTOMY CARE ETC) HAS BEEN WORKED OUT  Dr. Ninetta Lights is covering over the next 4 days of the Holidays and then Dr. Drue Second. Please contact one of them should questions arise.   LOS: 6 days   Acey Lav 03/28/2012, 3:22  PM

## 2012-03-29 ENCOUNTER — Inpatient Hospital Stay (HOSPITAL_COMMUNITY): Payer: Medicaid Other

## 2012-03-29 DIAGNOSIS — N179 Acute kidney failure, unspecified: Secondary | ICD-10-CM | POA: Diagnosis not present

## 2012-03-29 LAB — URINE MICROSCOPIC-ADD ON

## 2012-03-29 LAB — BASIC METABOLIC PANEL
BUN: 8 mg/dL (ref 6–23)
CO2: 23 mEq/L (ref 19–32)
Calcium: 8.2 mg/dL — ABNORMAL LOW (ref 8.4–10.5)
Calcium: 7.8 mg/dL — ABNORMAL LOW (ref 8.4–10.5)
Creatinine, Ser: 2.15 mg/dL — ABNORMAL HIGH (ref 0.50–1.35)
GFR calc Af Amer: 49 mL/min — ABNORMAL LOW (ref >90)
GFR calc non Af Amer: 42 mL/min — ABNORMAL LOW (ref >90)
Glucose, Bld: 123 mg/dL — ABNORMAL HIGH (ref 70–99)
Potassium: 4.3 mEq/L (ref 3.5–5.1)
Sodium: 134 mEq/L — ABNORMAL LOW (ref 135–145)
Sodium: 134 mEq/L — ABNORMAL LOW (ref 135–145)

## 2012-03-29 LAB — RENAL FUNCTION PANEL
BUN: 9 mg/dL (ref 6–23)
CO2: 22 mEq/L (ref 19–32)
Calcium: 8.2 mg/dL — ABNORMAL LOW (ref 8.4–10.5)
Creatinine, Ser: 2.27 mg/dL — ABNORMAL HIGH (ref 0.50–1.35)
GFR calc Af Amer: 39 mL/min — ABNORMAL LOW (ref >90)
Glucose, Bld: 105 mg/dL — ABNORMAL HIGH (ref 70–99)
Phosphorus: 3.4 mg/dL (ref 2.3–4.6)
Sodium: 133 mEq/L — ABNORMAL LOW (ref 135–145)

## 2012-03-29 LAB — URINALYSIS, ROUTINE W REFLEX MICROSCOPIC
Glucose, UA: NEGATIVE mg/dL
Nitrite: NEGATIVE
Protein, ur: 30 mg/dL — AB
Specific Gravity, Urine: 1.016 (ref 1.005–1.030)
Urobilinogen, UA: 1 mg/dL (ref 0.0–1.0)

## 2012-03-29 LAB — CULTURE, BLOOD (ROUTINE X 2): Culture: NO GROWTH

## 2012-03-29 LAB — CBC
MCHC: 32.6 g/dL (ref 30.0–36.0)
RDW: 16.4 % — ABNORMAL HIGH (ref 11.5–15.5)

## 2012-03-29 MED ORDER — SODIUM CHLORIDE 0.9 % IV BOLUS (SEPSIS)
1000.0000 mL | Freq: Once | INTRAVENOUS | Status: AC
  Administered 2012-03-29: 1000 mL via INTRAVENOUS

## 2012-03-29 MED ORDER — SODIUM CHLORIDE 0.9 % IV SOLN
INTRAVENOUS | Status: DC
  Administered 2012-03-29: 21:00:00 via INTRAVENOUS

## 2012-03-29 NOTE — Progress Notes (Signed)
Bladder scan performed this morning 0 residual. Last night bladder scan revealed 18. I&0 cath obtained 100 last night 03/28/12. Per report. Please advise

## 2012-03-29 NOTE — Progress Notes (Signed)
Patient ID: Leon Nichols, male   DOB: 1966/02/28, 46 y.o.   MRN: [id number] 2 Days Post-Op  Subjective: No C/O  Objective: Vital signs in last 24 hours: Temp:  [98 F (36.7 C)-98.6 F (37 C)] 98 F (36.7 C) (12/25 0707) Pulse Rate:  [76-99] 76  (12/25 0707) Resp:  [18] 18  (12/25 0707) BP: (115-143)/(78-94) 143/94 mmHg (12/25 0707) SpO2:  [98 %-100 %] 100 % (12/25 0707) Last BM Date: 03/23/12  Intake/Output from previous day: 12/24 0701 - 12/25 0700 In: 3191 [I.V.:2941; IV Piggyback:250] Out: 200 [Urine:200] Intake/Output this shift:    General appearance: alert and no distress GI: normal findings: soft, non-tender Incision/Wound: Stoma OK, Perineal wounds redressed, stable  Lab Results:   [facility] 03/29/12 0608 03/28/12 0500  WBC 7.9 7.4  HGB 8.6* 8.8*  HCT 26.4* 27.0*  PLT 303 297   BMET  Basename 03/29/12 0657 03/28/12 0500  NA 134* 132*  K 4.3 3.5  CL 102 97  CO2 24 27  GLUCOSE 96 82  BUN 8 3*  CREATININE 1.87* 0.57  CALCIUM 8.2* 8.5     Studies/Results: No results found.  Anti-infectives: Anti-infectives     Start     Dose/Rate Route Frequency Ordered Stop   03/27/12 1400   emtricitabine-tenofovir (TRUVADA) 200-300 MG per tablet 1 tablet        1 tablet Oral Daily 03/27/12 1044     03/27/12 1400   Dolutegravir Sodium TABS 50 mg        50 mg Oral Daily 03/27/12 1102     03/27/12 1400   cefTRIAXone (ROCEPHIN) 2 g in dextrose 5 % 50 mL IVPB        2 g 100 mL/hr over 30 Minutes Intravenous Every 24 hours 03/27/12 1217     03/27/12 1400   metroNIDAZOLE (FLAGYL) tablet 500 mg        500 mg Oral 3 times per day 03/27/12 1217     03/27/12 1300  sulfamethoxazole-trimethoprim (BACTRIM DS) 800-160 MG per tablet 1 tablet       1 tablet Oral Once per day on Mon Wed Fri 03/27/12 1217     03/23/12 1200   clindamycin (CLEOCIN) IVPB 900 mg  Status:  Discontinued        900 mg 100 mL/hr over 30 Minutes Intravenous 4 times per day 03/23/12 0946 03/24/12  1030   03/23/12 1030   piperacillin-tazobactam (ZOSYN) IVPB 3.375 g  Status:  Discontinued        3.375 g 12.5 mL/hr over 240 Minutes Intravenous 3 times per day 03/23/12 0922 03/27/12 1216   03/23/12 1030   vancomycin (VANCOCIN) IVPB 1000 mg/200 mL premix  Status:  Discontinued        1,000 mg 200 mL/hr over 60 Minutes Intravenous Every 8 hours 03/23/12 0922 03/28/12 1539   03/23/12 0000   acyclovir (ZOVIRAX) tablet 400 mg  Status:  Discontinued        400 mg Oral 5 times daily 03/22/12 2359 03/23/12 1039   03/22/12 1845   vancomycin (VANCOCIN) IVPB 1000 mg/200 mL premix        1,000 mg 200 mL/hr over 60 Minutes Intravenous  Once 03/22/12 1836 03/22/12 2237   03/22/12 1845   piperacillin-tazobactam (ZOSYN) IVPB 3.375 g        3.375 g 12.5 mL/hr over 240 Minutes Intravenous  Once 03/22/12 1836 03/23/12 0015          Assessment/Plan: s/p Procedure(s): DRESSING CHANGE UNDER  ANESTHESIA LAPAROSCOPIC DIVERTED COLOSTOMY Stable post op Cont abx, wound care   LOS: 7 days    Kai Railsback T 03/29/2012

## 2012-03-29 NOTE — Progress Notes (Signed)
2 Days Post-Op   Assessment: s/p Procedure(s): DRESSING CHANGE UNDER ANESTHESIA LAPAROSCOPIC DIVERTED COLOSTOMY Patient Active Problem List  Diagnosis  . HYPERTENSION, BENIGN ESSENTIAL  . Colitis  . Nonspecific (abnormal) findings on radiological and other examination of gastrointestinal tract  . Diverticulosis of colon (without mention of hemorrhage)  . Herpes zoster complicated  . Anemia  . Penile ulcer  . Non-penile genital ulcer  . Perianal abscess and fistula  . Hypotension  . Keratitis  . Septic shock  . HIV (human immunodeficiency virus infection)    Stable s/p colostomy and s/p drainage of extensive peri-recatal abscesses and fistulae  Plan: Sitz baths TID Continue dressing changes Will start clear liquids today  Subjective: Pain mainly at ostomy site, no nausea.   Objective: Vital signs in last 24 hours: Temp:  [98 F (36.7 C)-98.6 F (37 C)] 98 F (36.7 C) (12/25 0707) Pulse Rate:  [76-99] 76  (12/25 0707) Resp:  [18] 18  (12/25 0707) BP: (115-143)/(78-94) 143/94 mmHg (12/25 0707) SpO2:  [98 %-100 %] 100 % (12/25 0707)   Intake/Output from previous day: 12/24 0701 - 12/25 0700 In: 3191 [I.V.:2941; IV Piggyback:250] Out: 200 [Urine:200] Intake/Output this shift:     General appearance: alert, cooperative and mild distress Resp: clear to auscultation bilaterally GI: Soft, tender at ostomy site. ostomy appears viable  Incision: purulent drainage on dressings  Lab Results:   Basename 03/29/12 0608 03/28/12 0500  WBC 7.9 7.4  HGB 8.6* 8.8*  HCT 26.4* 27.0*  PLT 303 297   BMET  Basename 03/29/12 0657 03/28/12 0500  NA 134* 132*  K 4.3 3.5  CL 102 97  CO2 24 27  GLUCOSE 96 82  BUN 8 3*  CREATININE 1.87* 0.57  CALCIUM 8.2* 8.5    MEDS, Scheduled    . antiseptic oral rinse  15 mL Mouth Rinse BID  . cefTRIAXone (ROCEPHIN)  IV  2 g Intravenous Q24H  . Dolutegravir Sodium  50 mg Oral Daily  . emtricitabine-tenofovir  1 tablet Oral  Daily  . heparin subcutaneous  5,000 Units Subcutaneous Q8H  . ketorolac  15 mg Intravenous Q6H  . metroNIDAZOLE  500 mg Oral Q8H  . sodium chloride  3 mL Intravenous Q12H  . sulfamethoxazole-trimethoprim  1 tablet Oral 3 times weekly    Studies/Results: No results found.    LOS: 7 days   Vanita Panda, MD  Colorectal and General Surgery [facility]   03/29/2012 9:21 AM

## 2012-03-29 NOTE — Progress Notes (Addendum)
Subjective: [name] was seen and examined at bedside.  He is s/p diverting colostomy PD#2 and POD #6 from I&D of perianal abscesses.  His pain at site of colostomy is slightly improved.  [name] has been producing limited urine since yesterday and voided 100cc this morning.  He denies any dysuria or hematuria.  Otherwise, he currently denies any N/V/D, chest pain, shortness of breath at this time.  He has not had a BM yet.    Objective: Vital signs in last 24 hours: Filed Vitals:   03/27/12 2304 03/28/12 1035 03/28/12 2252 03/29/12 0707  BP: 120/69 115/79 122/78 143/94  Pulse: 90 99 85 76  Temp: 98.8 F (37.1 C) 98.6 F (37 C) 98 F (36.7 C) 98 F (36.7 C)  TempSrc: Oral  Oral Oral  Resp: 18 18 18 18   Height:      Weight:      SpO2: 100% 98% 100% 100%   Weight change:   Intake/Output Summary (Last 24 hours) at 03/29/12 0945 Last data filed at 03/29/12 0700  Gross per 24 hour  Intake   3191 ml  Output    200 ml  Net   2991 ml   Vitals reviewed.  General: resting in bed, NAD  HEENT: PERRLA, EOMI, no scleral icterus  Cardiac: RRR, no rubs, murmurs or gallops  Pulm: clear to auscultation bilaterally, no wheezes, rales, or rhonchi  Abd: soft, nontender, nondistended, BS present, colostomy in place Ext: warm and well perfused, no pedal edema, +2 dp b/l  Neuro: alert and oriented X3, cranial nerves II-XII grossly intact, strength and sensation to light touch equal in bilateral upper and lower extremities  Skin: + papules on b/l upper extremities. Dressing in place over penis/scrotal region and perianal region with drainage , - blood.  Lab Results: Basic Metabolic Panel:  Lab 03/29/12 1610 03/28/12 0500  NA 134* 132*  K 4.3 3.5  CL 102 97  CO2 24 27  GLUCOSE 96 82  BUN 8 3*  CREATININE 1.87* 0.57  CALCIUM 8.2* 8.5  MG -- --  PHOS -- --   Liver Function Tests:  Lab 03/24/12 0500 03/23/12 1039  AST 13 10  ALT <5 <5  ALKPHOS 47 47  BILITOT 1.1 0.5  PROT 6.1 6.3   ALBUMIN 1.7* 1.8*   CBC:  Lab 03/29/12 0608 03/28/12 0500 03/27/12 0900 03/23/12 1039  WBC 7.9 7.4 -- --  NEUTROABS -- -- 6.2 7.7  HGB 8.6* 8.8* -- --  HCT 26.4* 27.0* -- --  MCV 82.0 81.3 -- --  PLT 303 297 -- --   Hemoglobin A1C:  Lab 03/23/12 1039  HGBA1C 6.2*   Coagulation:  Lab 03/23/12 1039  LABPROT 16.4*  INR 1.35   Anemia Panel:  Lab 03/24/12 1130 03/22/12 2037 03/22/12 1856  VITAMINB12 -- 448 --  FOLATE 7.0 -- --  FERRITIN -- 1317* --  TIBC -- 186* --  IRON -- 11* --  RETICCTPCT -- -- 1.0   Urine Drug Screen: Drugs of Abuse     Component Value Date/Time   LABOPIA POSITIVE* 03/23/2012 0737   COCAINSCRNUR NONE DETECTED 03/23/2012 0737   LABBENZ NONE DETECTED 03/23/2012 0737   AMPHETMU NONE DETECTED 03/23/2012 0737   THCU NONE DETECTED 03/23/2012 0737   LABBARB NONE DETECTED 03/23/2012 0737    Urinalysis:  Lab 03/29/12 0810 03/23/12 0214  COLORURINE AMBER* YELLOW  LABSPEC 1.016 1.020  PHURINE 5.0 5.0  GLUCOSEU NEGATIVE NEGATIVE  HGBUR MODERATE* NEGATIVE  BILIRUBINUR MODERATE*  NEGATIVE  KETONESUR 15* NEGATIVE  PROTEINUR 30* NEGATIVE  UROBILINOGEN 1.0 1.0  NITRITE NEGATIVE NEGATIVE  LEUKOCYTESUR MODERATE* MODERATE*   Micro Results: Recent Results (from the past 240 hour(s))  CULTURE, BLOOD (ROUTINE X 2)     Status: Normal   Collection Time   03/22/12  7:10 PM      Component Value Range Status Comment   Specimen Description BLOOD ARM LEFT   Final    Special Requests     Final    Value: BOTTLES DRAWN AEROBIC AND ANAEROBIC AERO 10CC,ANAE 5CC   Culture  Setup Time 03/23/2012 04:02   Final    Culture NO GROWTH 5 DAYS   Final    Report Status 03/29/2012 FINAL   Final   CULTURE, BLOOD (ROUTINE X 2)     Status: Normal   Collection Time   03/22/12  7:27 PM      Component Value Range Status Comment   Specimen Description BLOOD ARM LEFT   Final    Special Requests BOTTLES DRAWN AEROBIC AND ANAEROBIC 10CC   Final    Culture  Setup Time 03/23/2012  04:02   Final    Culture NO GROWTH 5 DAYS   Final    Report Status 03/29/2012 FINAL   Final   WOUND CULTURE     Status: Normal   Collection Time   03/23/12  2:13 AM      Component Value Range Status Comment   Specimen Description WOUND PENIS   Final    Special Requests Normal   Final    Gram Stain     Final    Value: NO WBC SEEN     FEW SQUAMOUS EPITHELIAL CELLS PRESENT     FEW GRAM NEGATIVE COCCOBACILLI     FEW GRAM POSITIVE COCCI IN PAIRS     RARE GRAM NEGATIVE RODS   Culture     Final    Value: MULTIPLE ORGANISMS PRESENT, NONE PREDOMINANT     Note: NO STAPHYLOCOCCUS AUREUS ISOLATED NO GROUP A STREP (S.PYOGENES) ISOLATED   Report Status 03/26/2012 FINAL   Final   EYE CULTURE     Status: Normal   Collection Time   03/23/12  2:13 AM      Component Value Range Status Comment   Specimen Description EYE   Final    Special Requests Normal   Final    Culture NO GROWTH 2 DAYS   Final    Report Status 03/25/2012 FINAL   Final   MRSA PCR SCREENING     Status: Normal   Collection Time   03/23/12  4:50 PM      Component Value Range Status Comment   MRSA by PCR NEGATIVE  NEGATIVE Final   GC/CHLAMYDIA PROBE AMP     Status: Normal   Collection Time   03/23/12  7:33 PM      Component Value Range Status Comment   CT Probe RNA NEGATIVE  NEGATIVE Final    GC Probe RNA NEGATIVE  NEGATIVE Final    Medications: I have reviewed the patient's current medications. Scheduled Meds:    . antiseptic oral rinse  15 mL Mouth Rinse BID  . cefTRIAXone (ROCEPHIN)  IV  2 g Intravenous Q24H  . Dolutegravir Sodium  50 mg Oral Daily  . emtricitabine-tenofovir  1 tablet Oral Daily  . heparin subcutaneous  5,000 Units Subcutaneous Q8H  . ketorolac  15 mg Intravenous Q6H  . metroNIDAZOLE  500 mg Oral Q8H  . sodium  chloride  1,000 mL Intravenous Once  . sodium chloride  3 mL Intravenous Q12H  . sulfamethoxazole-trimethoprim  1 tablet Oral 3 times weekly   Continuous Infusions:    . 0.9 % NaCl with  KCl 40 mEq / L 200 mL/hr at 03/29/12 0610   PRN Meds:.acetaminophen, fentaNYL, ondansetron (ZOFRAN) IV, oxyCODONE, sodium chloride, temazepam  Assessment/Plan: 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   Perianal abscess and fistula--POD#6 s/p surgical exploration and Irrigation and debridement. S/p diverting colostomy 03/27/12 -wound cultures: multiple organisms present, none predominant, no staph aureus or group a strep isolated  -ID following--discontinue Vancomycin in setting of AKI.  Will continue Rocephin day 2, and oral flagyl day 3--total day of abx 8.    -surgery following--diverting colostomy yesterday  -spoke with Dr. Matthias Hughs from GI, will await surgery findings. May consider CT enterography in the future for further investigation. Would hold on serological testing at this time and correlate clinically. Will formally consult with further information or any changes.   Acute Renal Failure--Cr elevated 1.87 today with GFR 49.  On vancomycin and recently started on Truvada and Bactrim.   -will d/c Vancomycin and bactrim at this time (has not received Bactrim yet during hospital course) -discussed with Dr. Ninetta Lights, agrees with d/c Vancomycin but will continue Truvada for now -renal ultrasound -IVF -continue to monitor  Anemia--Hb 7.4 on admission. Transfused total of 4 units PRBCs during hospital course thus far. Stable s/p surgery.  -continue to monitor   Hypotension--resolved. Holding home dose lisinopril  -IVF  -continue to monitor   Septic shock--resolved. Was under care of PCCM, transferred back to IMT[facility] 03/27/12.   HIV (human immunodeficiency virus infection)--new diagnosis this admission. HIV antibody reactive + 03/23/12. HIV 1 RNA Quant: [id number] and  CD4 count 174 -ID following: continueTivicay and Truvada and bactrim for PCP Prophylaxis per Dr. 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  -continue to monitor   Diet: clear liquids  Dvt Ppx: Heparin  Dispo: Disposition is deferred at this time, awaiting improvement of current medical problems.  The patient does have a current PCP (MARTIN,NYKEDTRA, NP), therefore will not be requiring OPC follow-up after discharge.  The patient does not know have transportation limitations that hinder transportation to clinic appointments.  .Services Needed at time of discharge: Y = Yes, Blank = No PT:   OT:   RN:   Equipment:   Other:     LOS: 7 days   Leon Nichols 03/29/2012, 9:45 AM

## 2012-03-30 DIAGNOSIS — N179 Acute kidney failure, unspecified: Secondary | ICD-10-CM

## 2012-03-30 LAB — BASIC METABOLIC PANEL
BUN: 9 mg/dL (ref 6–23)
BUN: 9 mg/dL (ref 6–23)
Calcium: 8.3 mg/dL — ABNORMAL LOW (ref 8.4–10.5)
Chloride: 106 mEq/L (ref 96–112)
Creatinine, Ser: 2.4 mg/dL — ABNORMAL HIGH (ref 0.50–1.35)
Creatinine, Ser: 2.43 mg/dL — ABNORMAL HIGH (ref 0.50–1.35)
GFR calc Af Amer: 36 mL/min — ABNORMAL LOW (ref >90)
GFR calc Af Amer: 36 mL/min — ABNORMAL LOW (ref >90)
GFR calc non Af Amer: 31 mL/min — ABNORMAL LOW (ref >90)
Glucose, Bld: 92 mg/dL (ref 70–99)
Potassium: 3.9 mEq/L (ref 3.5–5.1)
Potassium: 4 mEq/L (ref 3.5–5.1)

## 2012-03-30 LAB — CBC
HCT: 26 % — ABNORMAL LOW (ref 39.0–52.0)
Hemoglobin: 8.3 g/dL — ABNORMAL LOW (ref 13.0–17.0)
MCH: 26.2 pg (ref 26.0–34.0)
MCHC: 31.9 g/dL (ref 30.0–36.0)
MCV: 82 fL (ref 78.0–100.0)
RDW: 16.6 % — ABNORMAL HIGH (ref 11.5–15.5)

## 2012-03-30 LAB — HIV-1 GENOTYPR PLUS: Resistance Assoc RT Mutations: NOT DETECTED

## 2012-03-30 MED ORDER — FENTANYL CITRATE 0.05 MG/ML IJ SOLN
50.0000 ug | INTRAMUSCULAR | Status: DC | PRN
  Administered 2012-03-31: 50 ug via INTRAVENOUS
  Administered 2012-04-01 – 2012-04-03 (×7): 100 ug via INTRAVENOUS
  Filled 2012-03-30 (×8): qty 2

## 2012-03-30 MED ORDER — OXYCODONE HCL 5 MG PO TABS
15.0000 mg | ORAL_TABLET | ORAL | Status: DC | PRN
  Administered 2012-03-30 – 2012-04-03 (×17): 15 mg via ORAL
  Filled 2012-03-30 (×16): qty 3

## 2012-03-30 MED ORDER — ACETAMINOPHEN 10 MG/ML IV SOLN
1000.0000 mg | Freq: Four times a day (QID) | INTRAVENOUS | Status: AC
  Administered 2012-03-30 – 2012-03-31 (×4): 1000 mg via INTRAVENOUS
  Filled 2012-03-30 (×4): qty 100

## 2012-03-30 NOTE — Progress Notes (Signed)
Agree with A&P of JD,NP. He has clearly improved and should be able to advance diet to solids shortly. Will leave colostomy "bridge" in until Monday

## 2012-03-30 NOTE — Progress Notes (Signed)
3 Days Post-Op  Subjective: Doing well this morning. Still with small amount of operative pain, advised patient as to management of his pain and communicating with nursing. Would like change in diet.  Objective: Vital signs in last 24 hours: Temp:  [98.4 F (36.9 C)-99.5 F (37.5 C)] 98.4 F (36.9 C) (12/25 2110) Pulse Rate:  [81-87] 81  (12/25 2110) Resp:  [18-20] 18  (12/25 2110) BP: (105-122)/(72-80) 122/72 mmHg (12/25 2110) SpO2:  [99 %-100 %] 99 % (12/25 2110) Last BM Date: 03/30/12  Intake/Output from previous day: 12/25 0701 - 12/26 0700 In: 4953.7 [P.O.:600; I.V.:3353.7; IV Piggyback:1000] Out: 200 [Urine:200] Intake/Output this shift: Total I/O In: -  Out: 200 [Urine:200]  General appearance: alert, cooperative, appears stated age and no distress Ostomy has small amount of brown liquid in bad, appears well perfused. +BS Labs: WBC wnl up slightly from prev. H&H stable Creatine up slightly Dressing C/DI  Lab Results:   Basename 03/30/12 0704 03/29/12 0608  WBC 8.4 7.9  HGB 8.3* 8.6*  HCT 26.0* 26.4*  PLT 287 303   BMET  Basename 03/30/12 0704 03/29/12 2020  NA 134* 133*  K 3.9 4.5  CL 104 103  CO2 22 22  GLUCOSE 92 105*  BUN 9 9  CREATININE 2.40* 2.27*  CALCIUM 8.1* 8.2*   PT/INR No results found for this basename: LABPROT:2,INR:2 in the last 72 hours ABG No results found for this basename: PHART:2,PCO2:2,PO2:2,HCO3:2 in the last 72 hours  Studies/Results: Dg Chest 2 View  03/29/2012  *RADIOLOGY REPORT*  Clinical Data: Evaluate pleural effusion, weakness, shortness of breath  CHEST - 2 VIEW  Comparison: 03/24/2012; 03/23/2012; 09/23/2011  Findings:  Examination is degraded secondary to patient positioning.  There is an unchanged cardiac silhouette and mediastinal contours to the decreased lung volumes and patient rotation.  Mild pulmonary congestion without frank evidence of edema.  Trace bilateral pleural effusions.  Bibasilar heterogeneous  opacities, right greater than left.  No pneumothorax.  Unchanged bones.  IMPRESSION: 1.  Trace bilateral effusions and bibasilar opacities, right greater than left, atelectasis versus infiltrate. 2. Pulmonary venous congestion without frank evidence of edema.   Original Report Authenticated By: Tacey Ruiz, MD    US Renal  03/29/2012  *RADIOLOGY REPORT*  Clinical Data: Acute renal failure postoperatively  RENAL/URINARY TRACT ULTRASOUND COMPLETE  Comparison:  CT abdomen 03/23/2012  Findings:  Right Kidney:  12.8 cm. No hydronephrosis or focal mass.  Left Kidney:  12.3 cm. No hydronephrosis or focal mass.  Bladder:  The bladder is decompressed but otherwise unremarkable.  Right sided pleural effusion partly imaged.  IMPRESSION: Normal exam.  Right pleural effusion partly visualized.   Original Report Authenticated By: Christiana Pellant, M.D.     Anti-infectives: Anti-infectives     Start     Dose/Rate Route Frequency Ordered Stop   03/27/12 1400   emtricitabine-tenofovir (TRUVADA) 200-300 MG per tablet 1 tablet  Status:  Discontinued        1 tablet Oral Daily 03/27/12 1044 03/29/12 1607   03/27/12 1400   Dolutegravir Sodium TABS 50 mg  Status:  Discontinued        50 mg Oral Daily 03/27/12 1102 03/29/12 1607   03/27/12 1400   cefTRIAXone (ROCEPHIN) 2 g in dextrose 5 % 50 mL IVPB        2 g 100 mL/hr over 30 Minutes Intravenous Every 24 hours 03/27/12 1217     03/27/12 1400   metroNIDAZOLE (FLAGYL) tablet 500 mg  500 mg Oral 3 times per day 03/27/12 1217     03/27/12 1300   sulfamethoxazole-trimethoprim (BACTRIM DS) 800-160 MG per tablet 1 tablet  Status:  Discontinued        1 tablet Oral Once per day on Mon Wed Fri 03/27/12 1217 03/29/12 1016   03/23/12 1200   clindamycin (CLEOCIN) IVPB 900 mg  Status:  Discontinued        900 mg 100 mL/hr over 30 Minutes Intravenous 4 times per day 03/23/12 0946 03/24/12 1030   03/23/12 1030   piperacillin-tazobactam (ZOSYN) IVPB 3.375 g  Status:   Discontinued        3.375 g 12.5 mL/hr over 240 Minutes Intravenous 3 times per day 03/23/12 0922 03/27/12 1216   03/23/12 1030   vancomycin (VANCOCIN) IVPB 1000 mg/200 mL premix  Status:  Discontinued        1,000 mg 200 mL/hr over 60 Minutes Intravenous Every 8 hours 03/23/12 0922 03/28/12 1539   03/23/12 0000   acyclovir (ZOVIRAX) tablet 400 mg  Status:  Discontinued        400 mg Oral 5 times daily 03/22/12 2359 03/23/12 1039   03/22/12 1845   vancomycin (VANCOCIN) IVPB 1000 mg/200 mL premix        1,000 mg 200 mL/hr over 60 Minutes Intravenous  Once 03/22/12 1836 03/22/12 2237   03/22/12 1845  piperacillin-tazobactam (ZOSYN) IVPB 3.375 g       3.375 g 12.5 mL/hr over 240 Minutes Intravenous  Once 03/22/12 1836 03/23/12 0015          Assessment/Plan:  Patient Active Problem List  Diagnosis  . HYPERTENSION, BENIGN ESSENTIAL  . Colitis  . Nonspecific (abnormal) findings on radiological and other examination of gastrointestinal tract  . Diverticulosis of colon (without mention of hemorrhage)  . Herpes zoster complicated  . Anemia  . Penile ulcer  . Perianal abscess and fistula  . Hypotension  . Keratitis  . Septic shock  . HIV (human immunodeficiency virus infection)  . Acute renal failure   s/p Procedure(s) (LRB) with comments: DRESSING CHANGE UNDER ANESTHESIA (N/A) LAPAROSCOPIC DIVERTED COLOSTOMY (N/A) Continue wound care, abx Sitz baths Pain management Adv to full liquids   LOS: 8 days    [facility] Pager # [phone] 03/30/2012

## 2012-03-30 NOTE — Progress Notes (Addendum)
Subjective: Mr. Steil was seen and examined at bedside.  He is s/p diverting colostomy POD#3 and POD #7 from I&D of perianal abscesses.  His pain at site of colostomy is slightly improved and bag is half full with light brown colored stool.  Mr. Ghosh claims he urinated approximately 5 times last night each time with output ~100-150cc.  He denies any dysuria or hematuria.  Otherwise, he currently denies any N/V/D, chest pain, shortness of breath at this time.  He is passing a lot of gas since last night.    Objective: Vital signs in last 24 hours: Filed Vitals:   03/28/12 2252 03/29/12 0707 03/29/12 1450 03/29/12 2110  BP: 122/78 143/94 105/80 122/72  Pulse: 85 76 87 81  Temp: 98 F (36.7 C) 98 F (36.7 C) 99.5 F (37.5 C) 98.4 F (36.9 C)  TempSrc: Oral Oral Oral Oral  Resp: 18 18 20 18   Height:      Weight:      SpO2: 100% 100% 100% 99%   Weight change:   Intake/Output Summary (Last 24 hours) at 03/30/12 0850 Last data filed at 03/30/12 0826  Gross per 24 hour  Intake 4953.67 ml  Output    300 ml  Net 4653.67 ml   Vitals reviewed.  General: resting in bed, NAD  HEENT: PERRLA, EOMI, no scleral icterus  Cardiac: RRR, no rubs, murmurs or gallops  Pulm: clear to auscultation bilaterally, no wheezes, rales, or rhonchi  Abd: soft, nontender, nondistended, BS present, colostomy in place Ext: warm and well perfused, no pedal edema, +2 dp b/l  Neuro: alert and oriented X3, cranial nerves II-XII grossly intact, strength and sensation to light touch equal in bilateral upper and lower extremities  Skin: + papules on b/l upper extremities. Dressing in place over penis/scrotal region and perianal region with drainage , - blood.  Lab Results: Basic Metabolic Panel:  Lab 03/30/12 1610 03/29/12 2020  NA 134* 133*  K 3.9 4.5  CL 104 103  CO2 22 22  GLUCOSE 92 105*  BUN 9 9  CREATININE 2.40* 2.27*  CALCIUM 8.1* 8.2*  MG -- --  PHOS -- 3.4   Liver Function Tests:  Lab 03/29/12  2020 03/24/12 0500 03/23/12 1039  AST -- 13 10  ALT -- <5 <5  ALKPHOS -- 47 47  BILITOT -- 1.1 0.5  PROT -- 6.1 6.3  ALBUMIN 1.5* 1.7* --   CBC:  Lab 03/30/12 0704 03/29/12 0608 03/27/12 0900 03/23/12 1039  WBC 8.4 7.9 -- --  NEUTROABS -- -- 6.2 7.7  HGB 8.3* 8.6* -- --  HCT 26.0* 26.4* -- --  MCV 82.0 82.0 -- --  PLT 287 303 -- --   Hemoglobin A1C:  Lab 03/23/12 1039  HGBA1C 6.2*   Coagulation:  Lab 03/23/12 1039  LABPROT 16.4*  INR 1.35   Anemia Panel:  Lab 03/24/12 1130  VITAMINB12 --  FOLATE 7.0  FERRITIN --  TIBC --  IRON --  RETICCTPCT --   Urine Drug Screen: Drugs of Abuse     Component Value Date/Time   LABOPIA POSITIVE* 03/23/2012 0737   COCAINSCRNUR NONE DETECTED 03/23/2012 0737   LABBENZ NONE DETECTED 03/23/2012 0737   AMPHETMU NONE DETECTED 03/23/2012 0737   THCU NONE DETECTED 03/23/2012 0737   LABBARB NONE DETECTED 03/23/2012 0737    Urinalysis:  Lab 03/29/12 0810  COLORURINE AMBER*  LABSPEC 1.016  PHURINE 5.0  GLUCOSEU NEGATIVE  HGBUR MODERATE*  BILIRUBINUR MODERATE*  KETONESUR 15*  PROTEINUR  30*  UROBILINOGEN 1.0  NITRITE NEGATIVE  LEUKOCYTESUR MODERATE*   Micro Results: Recent Results (from the past 240 hour(s))  CULTURE, BLOOD (ROUTINE X 2)     Status: Normal   Collection Time   03/22/12  7:10 PM      Component Value Range Status Comment   Specimen Description BLOOD ARM LEFT   Final    Special Requests     Final    Value: BOTTLES DRAWN AEROBIC AND ANAEROBIC AERO 10CC,ANAE 5CC   Culture  Setup Time 03/23/2012 04:02   Final    Culture NO GROWTH 5 DAYS   Final    Report Status 03/29/2012 FINAL   Final   CULTURE, BLOOD (ROUTINE X 2)     Status: Normal   Collection Time   03/22/12  7:27 PM      Component Value Range Status Comment   Specimen Description BLOOD ARM LEFT   Final    Special Requests BOTTLES DRAWN AEROBIC AND ANAEROBIC 10CC   Final    Culture  Setup Time 03/23/2012 04:02   Final    Culture NO GROWTH 5 DAYS    Final    Report Status 03/29/2012 FINAL   Final   WOUND CULTURE     Status: Normal   Collection Time   03/23/12  2:13 AM      Component Value Range Status Comment   Specimen Description WOUND PENIS   Final    Special Requests Normal   Final    Gram Stain     Final    Value: NO WBC SEEN     FEW SQUAMOUS EPITHELIAL CELLS PRESENT     FEW GRAM NEGATIVE COCCOBACILLI     FEW GRAM POSITIVE COCCI IN PAIRS     RARE GRAM NEGATIVE RODS   Culture     Final    Value: MULTIPLE ORGANISMS PRESENT, NONE PREDOMINANT     Note: NO STAPHYLOCOCCUS AUREUS ISOLATED NO GROUP A STREP (S.PYOGENES) ISOLATED   Report Status 03/26/2012 FINAL   Final   EYE CULTURE     Status: Normal   Collection Time   03/23/12  2:13 AM      Component Value Range Status Comment   Specimen Description EYE   Final    Special Requests Normal   Final    Culture NO GROWTH 2 DAYS   Final    Report Status 03/25/2012 FINAL   Final   MRSA PCR SCREENING     Status: Normal   Collection Time   03/23/12  4:50 PM      Component Value Range Status Comment   MRSA by PCR NEGATIVE  NEGATIVE Final   GC/CHLAMYDIA PROBE AMP     Status: Normal   Collection Time   03/23/12  7:33 PM      Component Value Range Status Comment   CT Probe RNA NEGATIVE  NEGATIVE Final    GC Probe RNA NEGATIVE  NEGATIVE Final    Medications: I have reviewed the patient's current medications. Scheduled Meds:    . antiseptic oral rinse  15 mL Mouth Rinse BID  . cefTRIAXone (ROCEPHIN)  IV  2 g Intravenous Q24H  . heparin subcutaneous  5,000 Units Subcutaneous Q8H  . metroNIDAZOLE  500 mg Oral Q8H  . sodium chloride  3 mL Intravenous Q12H   Continuous Infusions:    . sodium chloride 75 mL/hr at 03/29/12 2044   PRN Meds:.acetaminophen, fentaNYL, ondansetron (ZOFRAN) IV, oxyCODONE, sodium chloride, temazepam  Assessment/Plan: Mr.  Dragoo is a 46 year old African American male with PMH significant for HTN, diverticulosis, nonspecific colitis, and Herpes Zoster  complicated by left Ramsey-Hunt Syndrome admitted for perianal abscesses and fistulas with new diagnosis of HIV this hospital admission.   Perianal abscess and fistula--POD#7 s/p surgical exploration and Irrigation and debridement. S/p diverting colostomy 03/27/12 -wound cultures: multiple organisms present, none predominant, no staph aureus or group a strep isolated  -ID following--discontinue Vancomycin in setting of AKI.  Will continue Rocephin day 3, and oral flagyl day 4--total day of abx 9.    -surgery following -spoke with Dr. Matthias Hughs from GI, will await surgery findings. May consider CT enterography in the future for further investigation. Would hold on serological testing at this time and correlate clinically. Will formally consult with further information or any changes.   Acute Renal Failure--Cr worsening, Cr elevated to 2.40 today with GFR 31 today.  Was on vancomycin and recently started on Truvada and Bactrim. Discontinued all nephrotoxic drugs including Vancomycin and antiretorvirals   -renal ultrasound wnl but did show right pleural effusion -D/C IVF -continue to monitor -spoke with renal--Dr. Deterding.  Will continue to monitor.  Likely drug induced, all nephrotoxic medications have been discontinued.  D/c IVF especially in setting of trace b/l pleural effusions on cxr.    Anemia--Hb 7.4 on admission. Transfused total of 4 units PRBCs during hospital course thus far. Stable s/p surgery.  -continue to monitor   Hypotension--resolved. Holding home dose lisinopril  -IVF  -continue to monitor   Septic shock--resolved. Was under care of PCCM, transferred back to [facility] 03/27/12.   HIV (human immunodeficiency virus infection)--new diagnosis this admission. HIV antibody reactive + 03/23/12. HIV 1 RNA Quant: [id number] and CD4 count 174 -ID following: holding all antiretovirals in setting of AKI  HSV--HSV 1 and HSV 2 IgG positive 03/23/12. Hx of Herpes Zoster 09/16/11 complicated by  Ramsey-Hunt Syndrome.  -continue to monitor   Diet: full liquids--salt resticted  Dvt Ppx: Heparin  Dispo: Disposition is deferred at this time, awaiting improvement of current medical problems.  The patient does have a current PCP (MARTIN,NYKEDTRA, NP), therefore will not be requiring OPC follow-up after discharge.  The patient does not know have transportation limitations that hinder transportation to clinic appointments.  .Services Needed at time of discharge: Y = Yes, Blank = No PT:   OT:   RN:   Equipment:   Other:     LOS: 8 days   Darden Palmer 03/30/2012, 8:50 AM

## 2012-03-30 NOTE — Progress Notes (Signed)
INFECTIOUS DISEASE PROGRESS NOTE  ID: Leon Nichols is a 46 y.o. male with   Principal Problem:  *Perianal abscess and fistula Active Problems:  Colitis  Diverticulosis of colon (without mention of hemorrhage)  Anemia  Penile ulcer  Hypotension  Keratitis  Septic shock  HIV (human immunodeficiency virus infection)  Acute renal failure  Subjective: Without complaints  Abtx:  Anti-infectives     Start     Dose/Rate Route Frequency Ordered Stop   03/27/12 1400   emtricitabine-tenofovir (TRUVADA) 200-300 MG per tablet 1 tablet  Status:  Discontinued        1 tablet Oral Daily 03/27/12 1044 03/29/12 1607   03/27/12 1400   Dolutegravir Sodium TABS 50 mg  Status:  Discontinued        50 mg Oral Daily 03/27/12 1102 03/29/12 1607   03/27/12 1400   cefTRIAXone (ROCEPHIN) 2 g in dextrose 5 % 50 mL IVPB        2 g 100 mL/hr over 30 Minutes Intravenous Every 24 hours 03/27/12 1217     03/27/12 1400   metroNIDAZOLE (FLAGYL) tablet 500 mg        500 mg Oral 3 times per day 03/27/12 1217     03/27/12 1300   sulfamethoxazole-trimethoprim (BACTRIM DS) 800-160 MG per tablet 1 tablet  Status:  Discontinued        1 tablet Oral Once per day on Mon Wed Fri 03/27/12 1217 03/29/12 1016   03/23/12 1200   clindamycin (CLEOCIN) IVPB 900 mg  Status:  Discontinued        900 mg 100 mL/hr over 30 Minutes Intravenous 4 times per day 03/23/12 0946 03/24/12 1030   03/23/12 1030   piperacillin-tazobactam (ZOSYN) IVPB 3.375 g  Status:  Discontinued        3.375 g 12.5 mL/hr over 240 Minutes Intravenous 3 times per day 03/23/12 0922 03/27/12 1216   03/23/12 1030   vancomycin (VANCOCIN) IVPB 1000 mg/200 mL premix  Status:  Discontinued        1,000 mg 200 mL/hr over 60 Minutes Intravenous Every 8 hours 03/23/12 0922 03/28/12 1539   03/23/12 0000   acyclovir (ZOVIRAX) tablet 400 mg  Status:  Discontinued        400 mg Oral 5 times daily 03/22/12 2359 03/23/12 1039   03/22/12 1845   vancomycin  (VANCOCIN) IVPB 1000 mg/200 mL premix        1,000 mg 200 mL/hr over 60 Minutes Intravenous  Once 03/22/12 1836 03/22/12 2237   03/22/12 1845  piperacillin-tazobactam (ZOSYN) IVPB 3.375 g       3.375 g 12.5 mL/hr over 240 Minutes Intravenous  Once 03/22/12 1836 03/23/12 0015          Medications:  Scheduled:   . acetaminophen  1,000 mg Intravenous Q6H  . antiseptic oral rinse  15 mL Mouth Rinse BID  . cefTRIAXone (ROCEPHIN)  IV  2 g Intravenous Q24H  . heparin subcutaneous  5,000 Units Subcutaneous Q8H  . metroNIDAZOLE  500 mg Oral Q8H  . sodium chloride  3 mL Intravenous Q12H    Objective: Vital signs in last 24 hours: Temp:  [97 F (36.1 C)-98.4 F (36.9 C)] 97 F (36.1 C) (12/26 1345) Pulse Rate:  [81-92] 92  (12/26 1345) Resp:  [18-20] 20  (12/26 1345) BP: (122-129)/(72-84) 129/84 mmHg (12/26 1345) SpO2:  [97 %-99 %] 97 % (12/26 1345)   General appearance: alert, cooperative and no distress Resp: clear to  auscultation bilaterally Cardio: regular rate and rhythm GI: normal findings: bowel sounds normal and soft, non-tender  Lab Results  Basename 03/30/12 1218 03/30/12 0704 03/29/12 0608  WBC -- 8.4 7.9  HGB -- 8.3* 8.6*  HCT -- 26.0* 26.4*  NA 136 134* --  K 4.0 3.9 --  CL 106 104 --  CO2 22 22 --  BUN 9 9 --  CREATININE 2.43* 2.40* --  GLU -- -- --   Liver Panel  Basename 03/29/12 2020  PROT --  ALBUMIN 1.5*  AST --  ALT --  ALKPHOS --  BILITOT --  BILIDIR --  IBILI --   Sedimentation Rate No results found for this basename: ESRSEDRATE in the last 72 hours C-Reactive Protein No results found for this basename: CRP:2 in the last 72 hours  Microbiology: Recent Results (from the past 240 hour(s))  CULTURE, BLOOD (ROUTINE X 2)     Status: Normal   Collection Time   03/22/12  7:10 PM      Component Value Range Status Comment   Specimen Description BLOOD ARM LEFT   Final    Special Requests     Final    Value: BOTTLES DRAWN AEROBIC AND  ANAEROBIC AERO 10CC,ANAE 5CC   Culture  Setup Time 03/23/2012 04:02   Final    Culture NO GROWTH 5 DAYS   Final    Report Status 03/29/2012 FINAL   Final   CULTURE, BLOOD (ROUTINE X 2)     Status: Normal   Collection Time   03/22/12  7:27 PM      Component Value Range Status Comment   Specimen Description BLOOD ARM LEFT   Final    Special Requests BOTTLES DRAWN AEROBIC AND ANAEROBIC 10CC   Final    Culture  Setup Time 03/23/2012 04:02   Final    Culture NO GROWTH 5 DAYS   Final    Report Status 03/29/2012 FINAL   Final   WOUND CULTURE     Status: Normal   Collection Time   03/23/12  2:13 AM      Component Value Range Status Comment   Specimen Description WOUND PENIS   Final    Special Requests Normal   Final    Gram Stain     Final    Value: NO WBC SEEN     FEW SQUAMOUS EPITHELIAL CELLS PRESENT     FEW GRAM NEGATIVE COCCOBACILLI     FEW GRAM POSITIVE COCCI IN PAIRS     RARE GRAM NEGATIVE RODS   Culture     Final    Value: MULTIPLE ORGANISMS PRESENT, NONE PREDOMINANT     Note: NO STAPHYLOCOCCUS AUREUS ISOLATED NO GROUP A STREP (S.PYOGENES) ISOLATED   Report Status 03/26/2012 FINAL   Final   EYE CULTURE     Status: Normal   Collection Time   03/23/12  2:13 AM      Component Value Range Status Comment   Specimen Description EYE   Final    Special Requests Normal   Final    Culture NO GROWTH 2 DAYS   Final    Report Status 03/25/2012 FINAL   Final   MRSA PCR SCREENING     Status: Normal   Collection Time   03/23/12  4:50 PM      Component Value Range Status Comment   MRSA by PCR NEGATIVE  NEGATIVE Final   GC/CHLAMYDIA PROBE AMP     Status: Normal   Collection Time  03/23/12  7:33 PM      Component Value Range Status Comment   CT Probe RNA NEGATIVE  NEGATIVE Final    GC Probe RNA NEGATIVE  NEGATIVE Final   URINE CULTURE     Status: Normal (Preliminary result)   Collection Time   03/29/12  8:10 AM      Component Value Range Status Comment   Specimen Description URINE,  CLEAN CATCH   Final    Special Requests NONE   Final    Culture  Setup Time 03/29/2012 16:07   Final    Colony Count PENDING   Incomplete    Culture Culture reincubated for better growth   Final    Report Status PENDING   Incomplete     Studies/Results: Dg Chest 2 View  03/29/2012  *RADIOLOGY REPORT*  Clinical Data: Evaluate pleural effusion, weakness, shortness of breath  CHEST - 2 VIEW  Comparison: 03/24/2012; 03/23/2012; 09/23/2011  Findings:  Examination is degraded secondary to patient positioning.  There is an unchanged cardiac silhouette and mediastinal contours to the decreased lung volumes and patient rotation.  Mild pulmonary congestion without frank evidence of edema.  Trace bilateral pleural effusions.  Bibasilar heterogeneous opacities, right greater than left.  No pneumothorax.  Unchanged bones.  IMPRESSION: 1.  Trace bilateral effusions and bibasilar opacities, right greater than left, atelectasis versus infiltrate. 2. Pulmonary venous congestion without frank evidence of edema.   Original Report Authenticated By: Tacey Ruiz, MD    US Renal  03/29/2012  *RADIOLOGY REPORT*  Clinical Data: Acute renal failure postoperatively  RENAL/URINARY TRACT ULTRASOUND COMPLETE  Comparison:  CT abdomen 03/23/2012  Findings:  Right Kidney:  12.8 cm. No hydronephrosis or focal mass.  Left Kidney:  12.3 cm. No hydronephrosis or focal mass.  Bladder:  The bladder is decompressed but otherwise unremarkable.  Right sided pleural effusion partly imaged.  IMPRESSION: Normal exam.  Right pleural effusion partly visualized.   Original Report Authenticated By: Christiana Pellant, M.D.      Assessment/Plan: ARF AIDS Perianal Abscess with fistula Total days of antibiotics: 8 (ceftriaxone/flagyl) Prior zosyn, vanco Have stopped vanco, anti-retroviral therapy to watch his Cr.  Will check his HLA B 5701 to see if we can change him to epzicom if his Cr improves.  Possible change to augmentin for d/c (dose  renally) If d/c, home with TIW bactrim  Johny Sax Infectious Diseases [phone] 03/30/2012, 6:24 PM   LOS: 8 days

## 2012-03-30 NOTE — Care Management Note (Signed)
  Page 2 of 2   04/03/2012     10:14:37 AM   CARE MANAGEMENT NOTE 04/03/2012  Patient:  Leon Nichols, Leon Nichols   Account Number:  [id number]  Date Initiated:  03/27/2012  Documentation initiated by:  Ronny Flurry  Subjective/Objective Assessment:   status post drainage of multiple perineal perianal and pararectal abscesses     Action/Plan:   03-27-12  laparoscopic assisted sigmoid colostomy   Anticipated DC Date:  04/03/2012   Anticipated DC Plan:  HOME W HOME HEALTH SERVICES         Choice offered to / List presented to:  C-1 Patient        HH arranged  HH-1 RN      [facility] agency  [facility]   Status of service:  Completed, signed off Medicare Important Message given?   (If response is "NO", the following Medicare IM given date fields will be blank) Date Medicare IM given:   Date Additional Medicare IM given:    Discharge Disposition:  HOME W HOME HEALTH SERVICES  Per UR Regulation:    If discussed at Long Length of Stay Meetings, dates discussed:    Comments:  04-03-12 MD aware patient still in process of having 042 medication assistance approval , however, due to renal function patient will not be discharging on 042 medication ( will follow up in ID Clinic ) .  MATCH assistance given and explained . Patient voiced understanding .  Ronny Flurry RN BSN [phone]   03-31-12 Juliette Alcide at [employer] will met with patient today at bedside at 1500 to start application. Patient states he and his wife do not file taxes together. Therefore , his wife's proof of income is not needed.  Ronny Flurry RN BSN [phone]     03-31-12 Was told in progression patient's wife does not know about patient's 042 DX .   Called Juliette Alcide at [employer] .  Juliette Alcide needs both patient's and patient's wife's proof of income .   Called patient on phone to explain , patient stated him and his wife have been   apart for 2 years ( not living together ) , however , they are  not legally separated . Called [place] back and left her a message .  Ronny Flurry RN BSN [phone]   03-30-12 [facility] with [facility] [phone] ext [phone], fax [phone]  returned call , she will visit patient 03-31-12 at 1100 to start application for ADAP .  Juliette Alcide will ask to have application  expedited, if this is done patient could be approved by next Tuesday . Ronny Flurry RN BS [phone]     03-30-12 Call Financial counselor  Lennox Solders regarding  MEDICAID APPLICATION  Left message with Tad Moore ( bridge counselor with  [facility]   [phone] ) for assistance in getting the patient ADAP .   Ronny Flurry RN BSN [phone]

## 2012-03-30 NOTE — Consult Note (Signed)
WOC ostomy follow up Stoma type/location: upper mid line above umbilicus Stomal assessment/size: 1 5/8-2" oval, pink and moist Treatment options for stomal/peristomal skin: 2" barrier ring added around stoma at pouch change this am per bedside nursing due to stoma flush and in abdominal crease Output liquid brown in pouch Ostomy pouching: 1pc in place with good seal at the time of my visit today, just changed this am per bedside nursing.   Education provided: Explained need for placement of pouch with spout downward at next pouch change, he verbalized understanding of pouch emptying.  Will plan pouch change with pt tom. Supplies ordered to bedside for pouch change.   WOC team will follow along with you for ostomy care and teaching.  Molly Maselli Waldron RN,CWOCN [phone]

## 2012-03-31 LAB — CBC
HCT: 26 % — ABNORMAL LOW (ref 39.0–52.0)
Hemoglobin: 8.5 g/dL — ABNORMAL LOW (ref 13.0–17.0)
MCHC: 32.7 g/dL (ref 30.0–36.0)
RDW: 16.8 % — ABNORMAL HIGH (ref 11.5–15.5)
WBC: 6.7 10*3/uL (ref 4.0–10.5)

## 2012-03-31 LAB — BASIC METABOLIC PANEL
BUN: 10 mg/dL (ref 6–23)
Chloride: 103 mEq/L (ref 96–112)
Creatinine, Ser: 2.51 mg/dL — ABNORMAL HIGH (ref 0.50–1.35)
GFR calc Af Amer: 34 mL/min — ABNORMAL LOW (ref >90)
GFR calc non Af Amer: 30 mL/min — ABNORMAL LOW (ref >90)
Potassium: 3.6 mEq/L (ref 3.5–5.1)
Sodium: 135 mEq/L (ref 135–145)

## 2012-03-31 MED ORDER — ENSURE PUDDING PO PUDG
1.0000 | Freq: Three times a day (TID) | ORAL | Status: DC
  Administered 2012-04-01: 1 via ORAL

## 2012-03-31 MED ORDER — ACETAMINOPHEN 325 MG PO TABS
650.0000 mg | ORAL_TABLET | Freq: Four times a day (QID) | ORAL | Status: DC | PRN
  Administered 2012-04-01: 650 mg via ORAL
  Filled 2012-03-31: qty 2

## 2012-03-31 NOTE — Consult Note (Addendum)
WOC ostomy consult  Stoma type/location: Colostomy to left upper quad. Stomal assessment/size: approx 2 inches.  Visualized stoma through pouch, red and viable.  Pouch not removed. Pouch intact with good seal. Applied yesterday, will not plan to remove at this time.  Output  Mod semi-formed stool in pouch. Ostomy pouching: 1pc with barrier ring.   Education provided:  Discussed ordering supplies and pouching routines.  Pt states he is assisting with emptying.  Supplies at bedside for staff use during the weekend.  Educational materials in room.  Placed on [facility] discharge program. Pt could benefit from home health assistance after discharge.   Cammie Mcgee, RN, MSN, [employer]  [phone]

## 2012-03-31 NOTE — Progress Notes (Signed)
Subjective: Leon Nichols was seen and examined at bedside.  He is s/p diverting colostomy POD#4 and POD #8 from I&D of perianal abscesses.  His pain at site of colostomy is improved and passing gas and stool through bag.  Leon Nichols claims he urinated approximately 3 times last night each time with output ~100cc dark urine.  He denies any dysuria or hematuria.  Otherwise, he currently denies any N/V/D, chest pain, shortness of breath at this time.  He is tolerating full liquid diet well.   Objective: Vital signs in last 24 hours: Filed Vitals:   03/29/12 2110 03/30/12 1345 03/30/12 2103 03/31/12 0531  BP: 122/72 129/84 124/79 118/75  Pulse: 81 92 83 73  Temp: 98.4 F (36.9 C) 97 F (36.1 C) 98 F (36.7 C) 97.5 F (36.4 C)  TempSrc: Oral Oral Oral   Resp: 18 20 19 19   Height:      Weight:      SpO2: 99% 97% 98% 99%   Weight change:   Intake/Output Summary (Last 24 hours) at 03/31/12 0926 Last data filed at 03/31/12 6962  Gross per 24 hour  Intake 1261.25 ml  Output   1125 ml  Net 136.25 ml   Vitals reviewed.  General: resting in bed, NAD  HEENT: PERRLA, EOMI, no scleral icterus  Cardiac: RRR, no rubs, murmurs or gallops  Pulm: clear to auscultation bilaterally, no wheezes, rales, or rhonchi  Abd: soft, nontender, nondistended, BS present, colostomy in place Ext: warm and well perfused, no pedal edema, +2 dp b/l  Neuro: alert and oriented X3, cranial nerves II-XII grossly intact, strength and sensation to light touch equal in bilateral upper and lower extremities  Skin: + papules on b/l upper extremities. Dressing in place over penis/scrotal region and perianal region , - blood.  Lab Results: Basic Metabolic Panel:  Lab 03/31/12 9528 03/30/12 1218 03/29/12 2020  NA 135 136 --  K 3.6 4.0 --  CL 103 106 --  CO2 23 22 --  GLUCOSE 80 87 --  BUN 10 9 --  CREATININE 2.51* 2.43* --  CALCIUM 8.8 8.3* --  MG -- -- --  PHOS -- -- 3.4   Liver Function Tests:  Lab 03/29/12  2020  AST --  ALT --  ALKPHOS --  BILITOT --  PROT --  ALBUMIN 1.5*   CBC:  Lab 03/31/12 0710 03/30/12 0704 03/27/12 0900  WBC 6.7 8.4 --  NEUTROABS -- -- 6.2  HGB 8.5* 8.3* --  HCT 26.0* 26.0* --  MCV 81.8 82.0 --  PLT 290 287 --   Anemia Panel:  Lab 03/24/12 1130  VITAMINB12 --  FOLATE 7.0  FERRITIN --  TIBC --  IRON --  RETICCTPCT --   Urine Drug Screen: Drugs of Abuse     Component Value Date/Time   LABOPIA POSITIVE* 03/23/2012 0737   COCAINSCRNUR NONE DETECTED 03/23/2012 0737   LABBENZ NONE DETECTED 03/23/2012 0737   AMPHETMU NONE DETECTED 03/23/2012 0737   THCU NONE DETECTED 03/23/2012 0737   LABBARB NONE DETECTED 03/23/2012 0737    Urinalysis:  Lab 03/29/12 0810  COLORURINE AMBER*  LABSPEC 1.016  PHURINE 5.0  GLUCOSEU NEGATIVE  HGBUR MODERATE*  BILIRUBINUR MODERATE*  KETONESUR 15*  PROTEINUR 30*  UROBILINOGEN 1.0  NITRITE NEGATIVE  LEUKOCYTESUR MODERATE*   Micro Results: Recent Results (from the past 240 hour(s))  CULTURE, BLOOD (ROUTINE X 2)     Status: Normal   Collection Time   03/22/12  7:10 PM  Component Value Range Status Comment   Specimen Description BLOOD ARM LEFT   Final    Special Requests     Final    Value: BOTTLES DRAWN AEROBIC AND ANAEROBIC AERO 10CC,ANAE 5CC   Culture  Setup Time 03/23/2012 04:02   Final    Culture NO GROWTH 5 DAYS   Final    Report Status 03/29/2012 FINAL   Final   CULTURE, BLOOD (ROUTINE X 2)     Status: Normal   Collection Time   03/22/12  7:27 PM      Component Value Range Status Comment   Specimen Description BLOOD ARM LEFT   Final    Special Requests BOTTLES DRAWN AEROBIC AND ANAEROBIC 10CC   Final    Culture  Setup Time 03/23/2012 04:02   Final    Culture NO GROWTH 5 DAYS   Final    Report Status 03/29/2012 FINAL   Final   WOUND CULTURE     Status: Normal   Collection Time   03/23/12  2:13 AM      Component Value Range Status Comment   Specimen Description WOUND PENIS   Final    Special  Requests Normal   Final    Gram Stain     Final    Value: NO WBC SEEN     FEW SQUAMOUS EPITHELIAL CELLS PRESENT     FEW GRAM NEGATIVE COCCOBACILLI     FEW GRAM POSITIVE COCCI IN PAIRS     RARE GRAM NEGATIVE RODS   Culture     Final    Value: MULTIPLE ORGANISMS PRESENT, NONE PREDOMINANT     Note: NO STAPHYLOCOCCUS AUREUS ISOLATED NO GROUP A STREP (S.PYOGENES) ISOLATED   Report Status 03/26/2012 FINAL   Final   EYE CULTURE     Status: Normal   Collection Time   03/23/12  2:13 AM      Component Value Range Status Comment   Specimen Description EYE   Final    Special Requests Normal   Final    Culture NO GROWTH 2 DAYS   Final    Report Status 03/25/2012 FINAL   Final   MRSA PCR SCREENING     Status: Normal   Collection Time   03/23/12  4:50 PM      Component Value Range Status Comment   MRSA by PCR NEGATIVE  NEGATIVE Final   GC/CHLAMYDIA PROBE AMP     Status: Normal   Collection Time   03/23/12  7:33 PM      Component Value Range Status Comment   CT Probe RNA NEGATIVE  NEGATIVE Final    GC Probe RNA NEGATIVE  NEGATIVE Final   URINE CULTURE     Status: Normal (Preliminary result)   Collection Time   03/29/12  8:10 AM      Component Value Range Status Comment   Specimen Description URINE, CLEAN CATCH   Final    Special Requests NONE   Final    Culture  Setup Time 03/29/2012 16:07   Final    Colony Count 15,000 COLONIES/ML   Final    Culture GRAM NEGATIVE RODS   Final    Report Status PENDING   Incomplete    Medications: I have reviewed the patient's current medications. Scheduled Meds:    . antiseptic oral rinse  15 mL Mouth Rinse BID  . cefTRIAXone (ROCEPHIN)  IV  2 g Intravenous Q24H  . heparin subcutaneous  5,000 Units Subcutaneous Q8H  . metroNIDAZOLE  500 mg Oral Q8H  . 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   Perianal abscess and fistula--POD#8 s/p surgical exploration and Irrigation and debridement. S/p diverting colostomy 03/27/12 -wound cultures: multiple organisms present, none predominant, no staph aureus or group a strep isolated  -ID following--continue Rocephin day 4, and oral flagyl day 5--total day of abx 10.    -surgery following--will advance to low fiber -spoke with Dr. Matthias Hughs from GI, will await surgery findings. May consider CT enterography in the future for further investigation. Would hold on serological testing at this time and correlate clinically. Will formally consult with further information or any changes.   Acute Renal Failure--Cr worsening, Cr elevated to 2.51 today with GFR 34 today.  Was on vancomycin and recently started on Truvada and Bactrim. Discontinued all nephrotoxic drugs including Vancomycin and antiretorvirals   -renal ultrasound wnl but did show right pleural effusion -continue to monitor -spoke with renal--Dr. Deterding.  Will continue to monitor.  Likely drug induced, all nephrotoxic medications have been discontinued.  D/c IVF especially in setting of trace b/l pleural effusions on cxr.    Anemia--Hb 7.4 on admission. Transfused total of 4 units PRBCs during hospital course thus far. Stable s/p surgery.  -continue to monitor   Hypotension--resolved. Holding home dose lisinopril  -IVF  -continue to monitor   Septic shock--resolved. Was under care of PCCM, transferred back to IMTS 03/27/12.   HIV (human immunodeficiency virus infection)--new diagnosis this admission. HIV antibody reactive + 03/23/12. HIV 1 RNA Quant: [id number] and CD4 count 174 -ID following: holding all antiretovirals in setting of  AKI  HSV--HSV 1 and HSV 2 IgG positive 03/23/12. 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  -continue to monitor   Diet: low fiber--salt resticted  Dvt Ppx: Heparin  Dispo: Disposition is deferred at this time, awaiting improvement of current medical problems.  The patient does have a current PCP (MARTIN,NYKEDTRA, NP), therefore will not be requiring OPC follow-up after discharge.  The patient does not know have transportation limitations that hinder transportation to clinic appointments.  .Services Needed at time of discharge: Y = Yes, Blank = No PT:   OT:   RN:   Equipment:   Other:     LOS: 9 days   Darden Palmer 03/31/2012, 9:26 AM

## 2012-03-31 NOTE — Progress Notes (Signed)
Improving daily can advance diet. Ostomy bridge can come out Sunday or Monday

## 2012-03-31 NOTE — Progress Notes (Signed)
INFECTIOUS DISEASE PROGRESS NOTE  ID: Leon Nichols is a 46 y.o. male with  Principal Problem:  *Perianal abscess and fistula Active Problems:  Colitis  Diverticulosis of colon (without mention of hemorrhage)  Anemia  Penile ulcer  Hypotension  Keratitis  Septic shock  HIV (human immunodeficiency virus infection)  Acute renal failure  Subjective: without complaints  Abtx:  Anti-infectives     Start     Dose/Rate Route Frequency Ordered Stop   03/27/12 1400   emtricitabine-tenofovir (TRUVADA) 200-300 MG per tablet 1 tablet  Status:  Discontinued        1 tablet Oral Daily 03/27/12 1044 03/29/12 1607   03/27/12 1400   Dolutegravir Sodium TABS 50 mg  Status:  Discontinued        50 mg Oral Daily 03/27/12 1102 03/29/12 1607   03/27/12 1400   cefTRIAXone (ROCEPHIN) 2 g in dextrose 5 % 50 mL IVPB        2 g 100 mL/hr over 30 Minutes Intravenous Every 24 hours 03/27/12 1217     03/27/12 1400   metroNIDAZOLE (FLAGYL) tablet 500 mg        500 mg Oral 3 times per day 03/27/12 1217     03/27/12 1300   sulfamethoxazole-trimethoprim (BACTRIM DS) 800-160 MG per tablet 1 tablet  Status:  Discontinued        1 tablet Oral Once per day on Mon Wed Fri 03/27/12 1217 03/29/12 1016   03/23/12 1200   clindamycin (CLEOCIN) IVPB 900 mg  Status:  Discontinued        900 mg 100 mL/hr over 30 Minutes Intravenous 4 times per day 03/23/12 0946 03/24/12 1030   03/23/12 1030   piperacillin-tazobactam (ZOSYN) IVPB 3.375 g  Status:  Discontinued        3.375 g 12.5 mL/hr over 240 Minutes Intravenous 3 times per day 03/23/12 0922 03/27/12 1216   03/23/12 1030   vancomycin (VANCOCIN) IVPB 1000 mg/200 mL premix  Status:  Discontinued        1,000 mg 200 mL/hr over 60 Minutes Intravenous Every 8 hours 03/23/12 0922 03/28/12 1539   03/23/12 0000   acyclovir (ZOVIRAX) tablet 400 mg  Status:  Discontinued        400 mg Oral 5 times daily 03/22/12 2359 03/23/12 1039   03/22/12 1845   vancomycin  (VANCOCIN) IVPB 1000 mg/200 mL premix        1,000 mg 200 mL/hr over 60 Minutes Intravenous  Once 03/22/12 1836 03/22/12 2237   03/22/12 1845  piperacillin-tazobactam (ZOSYN) IVPB 3.375 g       3.375 g 12.5 mL/hr over 240 Minutes Intravenous  Once 03/22/12 1836 03/23/12 0015          Medications:  Scheduled:   . antiseptic oral rinse  15 mL Mouth Rinse BID  . cefTRIAXone (ROCEPHIN)  IV  2 g Intravenous Q24H  . heparin subcutaneous  5,000 Units Subcutaneous Q8H  . metroNIDAZOLE  500 mg Oral Q8H  . sodium chloride  3 mL Intravenous Q12H    Objective: Vital signs in last 24 hours: Temp:  [97.5 F (36.4 C)-98 F (36.7 C)] 97.5 F (36.4 C) (12/27 0531) Pulse Rate:  [73-83] 73  (12/27 0531) Resp:  [19] 19  (12/27 0531) BP: (118-124)/(75-79) 118/75 mmHg (12/27 0531) SpO2:  [98 %-99 %] 99 % (12/27 0531)   General appearance: alert, cooperative and no distress Resp: clear to auscultation bilaterally Cardio: regular rate and rhythm GI: normal  findings: bowel sounds normal and soft, non-tender  Lab Results  Basename 03/31/12 0710 03/30/12 1218 03/30/12 0704  WBC 6.7 -- 8.4  HGB 8.5* -- 8.3*  HCT 26.0* -- 26.0*  NA 135 136 --  K 3.6 4.0 --  CL 103 106 --  CO2 23 22 --  BUN 10 9 --  CREATININE 2.51* 2.43* --  GLU -- -- --   Liver Panel  Basename 03/29/12 2020  PROT --  ALBUMIN 1.5*  AST --  ALT --  ALKPHOS --  BILITOT --  BILIDIR --  IBILI --   Sedimentation Rate No results found for this basename: ESRSEDRATE in the last 72 hours C-Reactive Protein No results found for this basename: CRP:2 in the last 72 hours  Microbiology: Recent Results (from the past 240 hour(s))  CULTURE, BLOOD (ROUTINE X 2)     Status: Normal   Collection Time   03/22/12  7:10 PM      Component Value Range Status Comment   Specimen Description BLOOD ARM LEFT   Final    Special Requests     Final    Value: BOTTLES DRAWN AEROBIC AND ANAEROBIC AERO 10CC,ANAE 5CC   Culture  Setup  Time 03/23/2012 04:02   Final    Culture NO GROWTH 5 DAYS   Final    Report Status 03/29/2012 FINAL   Final   CULTURE, BLOOD (ROUTINE X 2)     Status: Normal   Collection Time   03/22/12  7:27 PM      Component Value Range Status Comment   Specimen Description BLOOD ARM LEFT   Final    Special Requests BOTTLES DRAWN AEROBIC AND ANAEROBIC 10CC   Final    Culture  Setup Time 03/23/2012 04:02   Final    Culture NO GROWTH 5 DAYS   Final    Report Status 03/29/2012 FINAL   Final   WOUND CULTURE     Status: Normal   Collection Time   03/23/12  2:13 AM      Component Value Range Status Comment   Specimen Description WOUND PENIS   Final    Special Requests Normal   Final    Gram Stain     Final    Value: NO WBC SEEN     FEW SQUAMOUS EPITHELIAL CELLS PRESENT     FEW GRAM NEGATIVE COCCOBACILLI     FEW GRAM POSITIVE COCCI IN PAIRS     RARE GRAM NEGATIVE RODS   Culture     Final    Value: MULTIPLE ORGANISMS PRESENT, NONE PREDOMINANT     Note: NO STAPHYLOCOCCUS AUREUS ISOLATED NO GROUP A STREP (S.PYOGENES) ISOLATED   Report Status 03/26/2012 FINAL   Final   EYE CULTURE     Status: Normal   Collection Time   03/23/12  2:13 AM      Component Value Range Status Comment   Specimen Description EYE   Final    Special Requests Normal   Final    Culture NO GROWTH 2 DAYS   Final    Report Status 03/25/2012 FINAL   Final   MRSA PCR SCREENING     Status: Normal   Collection Time   03/23/12  4:50 PM      Component Value Range Status Comment   MRSA by PCR NEGATIVE  NEGATIVE Final   GC/CHLAMYDIA PROBE AMP     Status: Normal   Collection Time   03/23/12  7:33 PM  Component Value Range Status Comment   CT Probe RNA NEGATIVE  NEGATIVE Final    GC Probe RNA NEGATIVE  NEGATIVE Final   URINE CULTURE     Status: Normal (Preliminary result)   Collection Time   03/29/12  8:10 AM      Component Value Range Status Comment   Specimen Description URINE, CLEAN CATCH   Final    Special Requests NONE    Final    Culture  Setup Time 03/29/2012 16:07   Final    Colony Count 15,000 COLONIES/ML   Final    Culture GRAM NEGATIVE RODS   Final    Report Status PENDING   Incomplete     Studies/Results: Dg Chest 2 View  03/29/2012  *RADIOLOGY REPORT*  Clinical Data: Evaluate pleural effusion, weakness, shortness of breath  CHEST - 2 VIEW  Comparison: 03/24/2012; 03/23/2012; 09/23/2011  Findings:  Examination is degraded secondary to patient positioning.  There is an unchanged cardiac silhouette and mediastinal contours to the decreased lung volumes and patient rotation.  Mild pulmonary congestion without frank evidence of edema.  Trace bilateral pleural effusions.  Bibasilar heterogeneous opacities, right greater than left.  No pneumothorax.  Unchanged bones.  IMPRESSION: 1.  Trace bilateral effusions and bibasilar opacities, right greater than left, atelectasis versus infiltrate. 2. Pulmonary venous congestion without frank evidence of edema.   Original Report Authenticated By: Tacey Ruiz, MD      Assessment/Plan: ARF AIDS  Perianal Abscess with fistula  Total days of antibiotics: 9 (vanco/zosyn ---> ceftriaxone/flagyl)   Have stopped vanco, anti-retroviral therapy to watch his Cr.  *Await HLA B 5701 to see if we can change him to epzicom if his Cr improves. Cr slightly worse last 2 days.  *Await UCx (showing GNR) Possible change to augmentin for d/c (dose renally)  If d/c, home with TIW bactrim           Johny Sax Infectious Diseases [phone] 03/31/2012, 1:59 PM   LOS: 9 days

## 2012-03-31 NOTE — Progress Notes (Signed)
INITIAL NUTRITION ASSESSMENT  DOCUMENTATION CODES Per approved criteria  -Severe malnutrition in the context of chronic illness   INTERVENTION: 1. Ensure Pudding po TID, each supplement provides 170 kcal and 4 grams of protein.  2. Encouraged pt to continue high protein/kcal snacks or nutrition shakes post d/c if continues with poor appetite.  3. RD will continue to follow    NUTRITION DIAGNOSIS: Malnutrition related to weakness and new HIV+ as evidenced by weight loss, meeting </=75% estimated nutrition needs.   Goal: PO intake to meet >/=90% estimated nutrition needs  Monitor:  PO intake, weight trends, I/O's, wound healing  Reason for Assessment: Malnutrition Screening Tool  46 y.o. male  Admitting Dx: Abscess of skin and subcutaneous tissue  ASSESSMENT: Pt admitted for I&D of abscess. Found to be HIV+.  S/p diverting colostomy day 4, S/p I&D day 8.  States that over the past year has ben gradually losing weight r/t being to weak to make meals. States he previously weighed 240 lbs. Pt with 13% body weight loss in 7 months, severe weight loss. Po intake this admission has been mostly <50% meal completion, likely meeting </=75% estimated nutrition needs PTA as well.  Pt was tolerating liquids well, advanced diet to low fiber for lunch today. Spoke with pt about low fiber foods, avoiding tough meats while colostomy healing.   Height: Ht Readings from Last 1 Encounters:  03/23/12 6' 0.83" (1.85 m)    Weight: Wt Readings from Last 1 Encounters:  03/24/12 208 lb 8.9 oz (94.6 kg)    Ideal Body Weight: 91 kg   % Ideal Body Weight: 117%  Wt Readings from Last 10 Encounters:  03/24/12 208 lb 8.9 oz (94.6 kg)  03/24/12 208 lb 8.9 oz (94.6 kg)  03/24/12 208 lb 8.9 oz (94.6 kg)  09/24/11 224 lb 3.3 oz (101.7 kg)  08/29/11 240 lb 3.2 oz (108.954 kg)  08/29/11 240 lb 3.2 oz (108.954 kg)  03/29/07 296 lb (134.265 kg)    Usual Body Weight: 240 lbs   % Usual Body Weight:  87%  BMI:  Body mass index is 27.64 kg/(m^2). Overweight   Estimated Nutritional Needs: Kcal: 2250-2450  Protein: 90-100 gm  Fluid: >2.3 L/day   Skin: s/p I&D of abscesses  Diet Order: Fiber Restricted  EDUCATION NEEDS: -Education needs addressed   Intake/Output Summary (Last 24 hours) at 03/31/12 1420 Last data filed at 03/31/12 1415  Gross per 24 hour  Intake   1120 ml  Output   1125 ml  Net     -5 ml    Last BM: 12/27- pt with BM from new ostomy.    Labs:   Lab 03/31/12 0710 03/30/12 1218 03/30/12 0704 03/29/12 2020  NA 135 136 134* --  K 3.6 4.0 3.9 --  CL 103 106 104 --  CO2 23 22 22  --  BUN 10 9 9  --  CREATININE 2.51* 2.43* 2.40* --  CALCIUM 8.8 8.3* 8.1* --  MG -- -- -- --  PHOS -- -- -- 3.4  GLUCOSE 80 87 92 --    CBG (last 3)  No results found for this basename: GLUCAP:3 in the last 72 hours  Scheduled Meds:   . antiseptic oral rinse  15 mL Mouth Rinse BID  . cefTRIAXone (ROCEPHIN)  IV  2 g Intravenous Q24H  . heparin subcutaneous  5,000 Units Subcutaneous Q8H  . metroNIDAZOLE  500 mg Oral Q8H  . sodium chloride  3 mL Intravenous Q12H  Continuous Infusions:   Past Medical History  Diagnosis Date  . Hypertension   . Diverticulosis   . Hemorrhoids   . Perirectal abscess   . Colitis   . History of shingles   . HIV (human immunodeficiency virus infection) 03/24/2012    Past Surgical History  Procedure Date  . Colonoscopy 08/31/2011    Procedure: COLONOSCOPY;  Surgeon: Hilarie Fredrickson, MD;  Location[facility] ENDOSCOPY;  Service: Endoscopy;  Laterality: N/A;  . Incision and drainage perirectal abscess 03/23/2012    Procedure: IRRIGATION AND DEBRIDEMENT PERIRECTAL ABSCESS;  Surgeon: Mariella Saa, MD;  Location: [facility] OR;  Service: General;  Laterality: N/A;  I & D soft tissue scrotal and perineal abscess  . Dressing change under anesthesia 03/27/2012    Procedure: DRESSING CHANGE UNDER ANESTHESIA;  Surgeon: Currie Paris, MD;  Location:  [facility] OR;  Service: General;  Laterality: N/A;  . Laparoscopic diverted colostomy 03/27/2012    Procedure: LAPAROSCOPIC DIVERTED COLOSTOMY;  Surgeon: Currie Paris, MD;  Location: [facility] OR;  Service: General;  Laterality: N/A;    Clarene Duke RD, LDN Pager [phone] After Hours pager [phone]

## 2012-03-31 NOTE — Progress Notes (Signed)
4 Days Post-Op  Subjective: Up and alert, incisions, are doing well, colostomy is working, pain controlled, mostly with oral meds.  He is tolerating full liquid diet.  Objective: Vital signs in last 24 hours: Temp:  [97 F (36.1 C)-98 F (36.7 C)] 97.5 F (36.4 C) (12/27 0531) Pulse Rate:  [73-92] 73  (12/27 0531) Resp:  [19-20] 19  (12/27 0531) BP: (118-129)/(75-84) 118/75 mmHg (12/27 0531) SpO2:  [97 %-99 %] 99 % (12/27 0531) Last BM Date: 03/30/12  Diet: full liquids, afebrile, VSS, rising creatinine, 840 PO recorded yesterday, 325 ml per ostomy Intake/Output from previous day: 12/26 0701 - 12/27 0700 In: 1621.3 [P.O.:840; I.V.:381.3; IV Piggyback:400] Out: 1225 [Urine:900; Stool:325] Intake/Output this shift:    General appearance: alert, cooperative and no distress Resp: clear to auscultation bilaterally and BS down in the bases,  GI: soft, few BS, stool from the ostomy. Incision/Wound: Open and clean rectal abscess sites packed wet to dry.  Lab Results:   [facility] 03/30/12 0704 03/29/12 0608  WBC 8.4 7.9  HGB 8.3* 8.6*  HCT 26.0* 26.4*  PLT 287 303    BMET  Basename 03/30/12 1218 03/30/12 0704  NA 136 134*  K 4.0 3.9  CL 106 104  CO2 22 22  GLUCOSE 87 92  BUN 9 9  CREATININE 2.43* 2.40*  CALCIUM 8.3* 8.1*   PT/INR No results found for this basename: LABPROT:2,INR:2 in the last 72 hours   Lab 03/29/12 2020  AST --  ALT --  ALKPHOS --  BILITOT --  PROT --  ALBUMIN 1.5*     Lipase  No results found for this basename: lipase     Studies/Results: Dg Chest 2 View  03/29/2012  *RADIOLOGY REPORT*  Clinical Data: Evaluate pleural effusion, weakness, shortness of breath  CHEST - 2 VIEW  Comparison: 03/24/2012; 03/23/2012; 09/23/2011  Findings:  Examination is degraded secondary to patient positioning.  There is an unchanged cardiac silhouette and mediastinal contours to the decreased lung volumes and patient rotation.  Mild pulmonary congestion  without frank evidence of edema.  Trace bilateral pleural effusions.  Bibasilar heterogeneous opacities, right greater than left.  No pneumothorax.  Unchanged bones.  IMPRESSION: 1.  Trace bilateral effusions and bibasilar opacities, right greater than left, atelectasis versus infiltrate. 2. Pulmonary venous congestion without frank evidence of edema.   Original Report Authenticated By: Tacey Ruiz, MD    US Renal  03/29/2012  *RADIOLOGY REPORT*  Clinical Data: Acute renal failure postoperatively  RENAL/URINARY TRACT ULTRASOUND COMPLETE  Comparison:  CT abdomen 03/23/2012  Findings:  Right Kidney:  12.8 cm. No hydronephrosis or focal mass.  Left Kidney:  12.3 cm. No hydronephrosis or focal mass.  Bladder:  The bladder is decompressed but otherwise unremarkable.  Right sided pleural effusion partly imaged.  IMPRESSION: Normal exam.  Right pleural effusion partly visualized.   Original Report Authenticated By: Christiana Pellant, M.D.     Medications:    . antiseptic oral rinse  15 mL Mouth Rinse BID  . cefTRIAXone (ROCEPHIN)  IV  2 g Intravenous Q24H  . heparin subcutaneous  5,000 Units Subcutaneous Q8H  . metroNIDAZOLE  500 mg Oral Q8H  . sodium chloride  3 mL Intravenous Q12H     Assessment/Plan status post drainage of multiple perineal, perianal and pararectal abscesses; s/p laparoscopic assisted sigmoid colostomy, 03/27/2012, Currie Paris, MD (POD4) HIV (human immunodeficiency virus infection) Acute renal failure Septic shock/Hypotension Colitis/Diverticulosis of colon  Penile ulcer Anemia Bilat pleural effusions/opacity,right greater  than left   Plan: I will see if we can do showers and sitz baths for rectal open sites, Anemia is stable, creatinine is still up defer to medicine.  Advance diet to low fiber, he needs to be up moving more.  Doing well from surgical standpoint.        LOS: 9 days    Alexcia Schools 03/31/2012

## 2012-04-01 LAB — CBC
MCV: 82.4 fL (ref 78.0–100.0)
Platelets: 255 10*3/uL (ref 150–400)
RBC: 2.9 MIL/uL — ABNORMAL LOW (ref 4.22–5.81)
RDW: 16.9 % — ABNORMAL HIGH (ref 11.5–15.5)
WBC: 6 10*3/uL (ref 4.0–10.5)

## 2012-04-01 LAB — BASIC METABOLIC PANEL
BUN: 9 mg/dL (ref 6–23)
CO2: 23 mEq/L (ref 19–32)
Chloride: 105 mEq/L (ref 96–112)
GFR calc Af Amer: 35 mL/min — ABNORMAL LOW (ref >90)
Potassium: 4 mEq/L (ref 3.5–5.1)
Sodium: 136 mEq/L (ref 135–145)

## 2012-04-01 MED ORDER — FENTANYL CITRATE 0.05 MG/ML IJ SOLN
INTRAMUSCULAR | Status: AC
  Administered 2012-04-01: 100 ug
  Filled 2012-04-01: qty 2

## 2012-04-01 NOTE — Progress Notes (Signed)
5 Days Post-Op  Subjective: Got out of bed for bed change and got dizzy, so he hasn't been up much.  Also unaware of what a sitz bath is.  I told him he can shower several times a day, if he wants and do dressing change that way too.  Objective: Vital signs in last 24 hours: Temp:  [98 F (36.7 C)-98.2 F (36.8 C)] 98.1 F (36.7 C) (12/28 0518) Pulse Rate:  [79-92] 79  (12/28 0518) Resp:  [16-20] 16  (12/28 0518) BP: (127-143)/(85-90) 143/88 mmHg (12/28 0518) SpO2:  [99 %-100 %] 100 % (12/28 0518) Last BM Date: 03/31/12  720 PO recorded; diet: low fiber, afebrile, VSS, WBC is normal H/H down some, creatinine still up 2.44 Stool 150 recorded  Intake/Output from previous day: 12/27 0701 - 12/28 0700 In: 720 [P.O.:720] Out: 2125 [Urine:1975; Stool:150] Intake/Output this shift:    General appearance: alert, cooperative, no distress and he still just feels bad.  No specific complaints. GI: soft, non-tender; bowel sounds normal; no masses,  no organomegaly and abdominal incision looks fine.  abd soft, no distension, ostomy is working well, with feculent material and gas.  Tolerating Full liquid diet. Skin: Skin color, texture, turgor normal.  Open areas not changed since last PM, groin is clean.  he had allot of green, purulent drainage in the upper posterior rectum., sites are clean on exam.  Lab Results:   Basename 04/01/12 0438 03/31/12 0710  WBC 6.0 6.7  HGB 7.5* 8.5*  HCT 23.9* 26.0*  PLT 255 290    BMET  Basename 04/01/12 0438 03/31/12 0710  NA 136 135  K 4.0 3.6  CL 105 103  CO2 23 23  GLUCOSE 85 80  BUN 9 10  CREATININE 2.44* 2.51*  CALCIUM 8.8 8.8   PT/INR No results found for this basename: LABPROT:2,INR:2 in the last 72 hours   Lab 03/29/12 2020  AST --  ALT --  ALKPHOS --  BILITOT --  PROT --  ALBUMIN 1.5*     Lipase  No results found for this basename: lipase     Studies/Results: No results found.  Medications:    . antiseptic oral  rinse  15 mL Mouth Rinse BID  . cefTRIAXone (ROCEPHIN)  IV  2 g Intravenous Q24H  . feeding supplement  1 Container Oral TID BM  . heparin subcutaneous  5,000 Units Subcutaneous Q8H  . metroNIDAZOLE  500 mg Oral Q8H  . sodium chloride  3 mL Intravenous Q12H    Assessment/Plan status post drainage of multiple perineal, perianal and pararectal abscesses; s/p laparoscopic assisted sigmoid colostomy, 03/27/2012, Currie Paris, MD (POD5)  HIV (human immunodeficiency virus infection)  Acute renal failure  Septic shock/Hypotension  Colitis/Diverticulosis of colon  Penile ulcer  Anemia  Bilat pleural effusions/opacity,right greater than left  Plan:  I have encouraged him to get into the shower or sitz multiple times per day, and keep the open areas clean and redress each time and prn.  His ostomy is functioning well, and he is tolerating diet.  Dressing changes and time for open areas now.  LOS: 10 days    Destynie Toomey 04/01/2012

## 2012-04-01 NOTE — Progress Notes (Signed)
Subjective: Patient is POD5 for diverting colostomy and POD9 from I&D of perianal abscesses.  He is eating full breakfast when I enter. He complains of an episode of dizziness which occurred yesterday when he stood up too fast getting out of bed. Other than this episode, he has no complaints. He has good urinary output (1500cc last night) and denies any dysuria or hematuria. He also denies CP, SOB,  N/V. He is learning how to empty ostomy bag.   Objective: Vital signs in last 24 hours: Filed Vitals:   03/31/12 0531 03/31/12 1408 03/31/12 2141 04/01/12 0518  BP: 118/75 127/90 134/85 143/88  Pulse: 73 92 80 79  Temp: 97.5 F (36.4 C) 98.2 F (36.8 C) 98 F (36.7 C) 98.1 F (36.7 C)  TempSrc:  Oral Oral Oral  Resp: 19 20 18 16   Height:      Weight:      SpO2: 99% 100% 99% 100%   Weight change:   Intake/Output Summary (Last 24 hours) at 04/01/12 0802 Last data filed at 04/01/12 0800  Gross per 24 hour  Intake    870 ml  Output   2125 ml  Net  -1255 ml   Vitals reviewed.  General: sitting up in bed eating breakfast, NAD  HEENT: PERRLA, EOMI, no scleral icterus  Cardiac: RRR, no rubs, murmurs or gallops  Pulm: clear to auscultation bilaterally, no wheezes, rales, or rhonchi  Abd: soft, nontender, nondistended, normoactive BS. Ostomy bag in LLQ with brown stool and gas.  Ext: warm and well perfused, no pedal edema Neuro: alert and oriented X3, cranial nerves II-XII grossly intact, strength and sensation to light touch equal in bilateral upper and lower extremities  Skin: Flesh colored 1cm papules on b/l upper extremities. Perineal and anal wounds with gauze dressing in place, no active blood or drainage visualized.  Lab Results: Basic Metabolic Panel:  Lab 04/01/12 2440 03/31/12 0710 03/29/12 2020  NA 136 135 --  K 4.0 3.6 --  CL 105 103 --  CO2 23 23 --  GLUCOSE 85 80 --  BUN 9 10 --  CREATININE 2.44* 2.51* --  CALCIUM 8.8 8.8 --  MG -- -- --  PHOS -- -- 3.4   Liver  Function Tests:  Lab 03/29/12 2020  AST --  ALT --  ALKPHOS --  BILITOT --  PROT --  ALBUMIN 1.5*   CBC:  Lab 04/01/12 0438 03/31/12 0710 03/27/12 0900  WBC 6.0 6.7 --  NEUTROABS -- -- 6.2  HGB 7.5* 8.5* --  HCT 23.9* 26.0* --  MCV 82.4 81.8 --  PLT 255 290 --   Urinalysis:  Lab 03/29/12 0810  COLORURINE AMBER*  LABSPEC 1.016  PHURINE 5.0  GLUCOSEU NEGATIVE  HGBUR MODERATE*  BILIRUBINUR MODERATE*  KETONESUR 15*  PROTEINUR 30*  UROBILINOGEN 1.0  NITRITE NEGATIVE  LEUKOCYTESUR MODERATE*   Micro Results: Recent Results (from the past 240 hour(s))  CULTURE, BLOOD (ROUTINE X 2)     Status: Normal   Collection Time   03/22/12  7:10 PM      Component Value Range Status Comment   Specimen Description BLOOD ARM LEFT   Final    Special Requests     Final    Value: BOTTLES DRAWN AEROBIC AND ANAEROBIC AERO 10CC,ANAE 5CC   Culture  Setup Time 03/23/2012 04:02   Final    Culture NO GROWTH 5 DAYS   Final    Report Status 03/29/2012 FINAL   Final   CULTURE, BLOOD (  ROUTINE X 2)     Status: Normal   Collection Time   03/22/12  7:27 PM      Component Value Range Status Comment   Specimen Description BLOOD ARM LEFT   Final    Special Requests BOTTLES DRAWN AEROBIC AND ANAEROBIC 10CC   Final    Culture  Setup Time 03/23/2012 04:02   Final    Culture NO GROWTH 5 DAYS   Final    Report Status 03/29/2012 FINAL   Final   WOUND CULTURE     Status: Normal   Collection Time   03/23/12  2:13 AM      Component Value Range Status Comment   Specimen Description WOUND PENIS   Final    Special Requests Normal   Final    Gram Stain     Final    Value: NO WBC SEEN     FEW SQUAMOUS EPITHELIAL CELLS PRESENT     FEW GRAM NEGATIVE COCCOBACILLI     FEW GRAM POSITIVE COCCI IN PAIRS     RARE GRAM NEGATIVE RODS   Culture     Final    Value: MULTIPLE ORGANISMS PRESENT, NONE PREDOMINANT     Note: NO STAPHYLOCOCCUS AUREUS ISOLATED NO GROUP A STREP (S.PYOGENES) ISOLATED   Report Status  03/26/2012 FINAL   Final   EYE CULTURE     Status: Normal   Collection Time   03/23/12  2:13 AM      Component Value Range Status Comment   Specimen Description EYE   Final    Special Requests Normal   Final    Culture NO GROWTH 2 DAYS   Final    Report Status 03/25/2012 FINAL   Final   MRSA PCR SCREENING     Status: Normal   Collection Time   03/23/12  4:50 PM      Component Value Range Status Comment   MRSA by PCR NEGATIVE  NEGATIVE Final   GC/CHLAMYDIA PROBE AMP     Status: Normal   Collection Time   03/23/12  7:33 PM      Component Value Range Status Comment   CT Probe RNA NEGATIVE  NEGATIVE Final    GC Probe RNA NEGATIVE  NEGATIVE Final   URINE CULTURE     Status: Normal (Preliminary result)   Collection Time   03/29/12  8:10 AM      Component Value Range Status Comment   Specimen Description URINE, CLEAN CATCH   Final    Special Requests NONE   Final    Culture  Setup Time 03/29/2012 16:07   Final    Colony Count 15,000 COLONIES/ML   Final    Culture GRAM NEGATIVE RODS   Final    Report Status PENDING   Incomplete    Medications: I have reviewed the patient's current medications. Scheduled Meds:    . antiseptic oral rinse  15 mL Mouth Rinse BID  . cefTRIAXone (ROCEPHIN)  IV  2 g Intravenous Q24H  . feeding supplement  1 Container Oral TID BM  . heparin subcutaneous  5,000 Units Subcutaneous Q8H  . metroNIDAZOLE  500 mg Oral Q8H  . sodium chloride  3 mL Intravenous Q12H   Continuous Infusions:   PRN Meds:.acetaminophen, fentaNYL, ondansetron (ZOFRAN) IV, oxyCODONE, sodium chloride, temazepam  Assessment/Plan: [name] is a 46 year old African American male with new diagnosis of HIV admitted for treatment of perianal abscesses and fistulas.  1) Perianal abscess and fistula Patient is  POD9 /p surgical exploration, irrigation and debridement of perineal and perianal fistulae and is POD5 s/p diverting colostomy. Patient tolerated procedure and postoperative period  well. Good ostomy functioning. Tolerating diet well. No evidence of postoperative infection.  -wound cultures: multiple organisms present, none predominant, no staph aureus or group a strep isolated  -ID following--continue Rocephin day 6, and oral flagyl day 6--total day of abx 11.  Likely transition to augmentin (renally dosed) on discharge.  -Surgery following--good ostomy output, now on low fiber diet, sitz baths and dressing changes.  - Some concern for Crohn's picture given fistulae. Per Dr. Marjorie Smolder w GI, may consider CT enterography in the future for further investigation. Would hold on serological testing at this time and correlate clinically. Will formally consult with further information or any changes.   2) Acute Renal Failure Likely 2/2 nephrotoxic medications (vanc, truvada, bactrim). Cr peaked at 2.51 but has improved with discontinuation of nephrotoxic medications and is 2.44 today with good UOP overnight. Renal u/s did not reveal any pathology. Holding IVF in setting of bilateral pleural effusions.  - continue to follow Cr - Dr. Virgina Organ spoke with Dr. Darrick Penna, suspect drug induced ARF.  3) Anemia Hb 7.4 on admission. Transfused total of 4 units PRBCs during hospital course thus far. His Hb has been stable postoperatively. This morning it is 7.5.  - Daily CBCs - Transfuse if <7   4) HIV, new diagnosis HIV 1 RNA Quant: [id number] and CD4 count 174. ID following: holding all antiretovirals in setting of ARF. Per Dr. Ninetta Lights, await HLA B [id number] to see if epzicom would be option if his Cr improves.  5) Hypotension--resolved. Holding home dose lisinopril    6) Septic shock--resolved. Was under care of PCCM, transferred back to [facility]MT[facility] 03/27/12.   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 No active lesions apparent now.  -continue to monitor   Diet: low fiber--salt resticted  Dvt Ppx: Heparin  Dispo:  Disposition is deferred at this time, awaiting improvement of current medical problems.  The patient does have a current PCP (MARTIN,NYKEDTRA, NP), therefore will not be requiring OPC follow-up after discharge.  The patient does not know have transportation limitations that hinder transportation to clinic appointments.  .Services Needed at time of discharge: Y = Yes, Blank = No PT:   OT:   RN:   Equipment:   Other:     LOS: 10 days   Bronson Curb 04/01/2012, 8:02 AM

## 2012-04-01 NOTE — Progress Notes (Signed)
INFECTIOUS DISEASE PROGRESS NOTE  ID: Leon Nichols is a 46 y.o. male with   Principal Problem:  *Perianal abscess and fistula Active Problems:  Colitis  Diverticulosis of colon (without mention of hemorrhage)  Anemia  Penile ulcer  Hypotension  Keratitis  Septic shock  HIV (human immunodeficiency virus infection)  Acute renal failure  Subjective: Without complaints. Feels better- got bathe this AM. Noted that he had episode of dizziness.  Abtx:  Anti-infectives     Start     Dose/Rate Route Frequency Ordered Stop   03/27/12 1400   emtricitabine-tenofovir (TRUVADA) 200-300 MG per tablet 1 tablet  Status:  Discontinued        1 tablet Oral Daily 03/27/12 1044 03/29/12 1607   03/27/12 1400   Dolutegravir Sodium TABS 50 mg  Status:  Discontinued        50 mg Oral Daily 03/27/12 1102 03/29/12 1607   03/27/12 1400   cefTRIAXone (ROCEPHIN) 2 g in dextrose 5 % 50 mL IVPB        2 g 100 mL/hr over 30 Minutes Intravenous Every 24 hours 03/27/12 1217     03/27/12 1400   metroNIDAZOLE (FLAGYL) tablet 500 mg        500 mg Oral 3 times per day 03/27/12 1217     03/27/12 1300   sulfamethoxazole-trimethoprim (BACTRIM DS) 800-160 MG per tablet 1 tablet  Status:  Discontinued        1 tablet Oral Once per day on Mon Wed Fri 03/27/12 1217 03/29/12 1016   03/23/12 1200   clindamycin (CLEOCIN) IVPB 900 mg  Status:  Discontinued        900 mg 100 mL/hr over 30 Minutes Intravenous 4 times per day 03/23/12 0946 03/24/12 1030   03/23/12 1030   piperacillin-tazobactam (ZOSYN) IVPB 3.375 g  Status:  Discontinued        3.375 g 12.5 mL/hr over 240 Minutes Intravenous 3 times per day 03/23/12 0922 03/27/12 1216   03/23/12 1030   vancomycin (VANCOCIN) IVPB 1000 mg/200 mL premix  Status:  Discontinued        1,000 mg 200 mL/hr over 60 Minutes Intravenous Every 8 hours 03/23/12 0922 03/28/12 1539   03/23/12 0000   acyclovir (ZOVIRAX) tablet 400 mg  Status:  Discontinued        400 mg Oral 5  times daily 03/22/12 2359 03/23/12 1039   03/22/12 1845   vancomycin (VANCOCIN) IVPB 1000 mg/200 mL premix        1,000 mg 200 mL/hr over 60 Minutes Intravenous  Once 03/22/12 1836 03/22/12 2237   03/22/12 1845  piperacillin-tazobactam (ZOSYN) IVPB 3.375 g       3.375 g 12.5 mL/hr over 240 Minutes Intravenous  Once 03/22/12 1836 03/23/12 0015          Medications:  Scheduled:   . antiseptic oral rinse  15 mL Mouth Rinse BID  . cefTRIAXone (ROCEPHIN)  IV  2 g Intravenous Q24H  . heparin subcutaneous  5,000 Units Subcutaneous Q8H  . metroNIDAZOLE  500 mg Oral Q8H  . sodium chloride  3 mL Intravenous Q12H    Objective: Vital signs in last 24 hours: Temp:  [98 F (36.7 C)-98.2 F (36.8 C)] 98.1 F (36.7 C) (12/28 0518) Pulse Rate:  [79-92] 79  (12/28 0518) Resp:  [16-20] 16  (12/28 0518) BP: (127-143)/(85-90) 143/88 mmHg (12/28 0518) SpO2:  [99 %-100 %] 100 % (12/28 0518)   General appearance: alert, cooperative and  no distress Resp: clear to auscultation bilaterally Cardio: regular rate and rhythm GI: normal findings: bowel sounds normal and soft, non-tender Extremities: edema 1-2+  Lab Results  Basename 04/01/12 0438 03/31/12 0710  WBC 6.0 6.7  HGB 7.5* 8.5*  HCT 23.9* 26.0*  NA 136 135  K 4.0 3.6  CL 105 103  CO2 23 23  BUN 9 10  CREATININE 2.44* 2.51*  GLU -- --   Liver Panel  Basename 03/29/12 2020  PROT --  ALBUMIN 1.5*  AST --  ALT --  ALKPHOS --  BILITOT --  BILIDIR --  IBILI --   Sedimentation Rate No results found for this basename: ESRSEDRATE in the last 72 hours C-Reactive Protein No results found for this basename: CRP:2 in the last 72 hours  Microbiology: Recent Results (from the past 240 hour(s))  CULTURE, BLOOD (ROUTINE X 2)     Status: Normal   Collection Time   03/22/12  7:10 PM      Component Value Range Status Comment   Specimen Description BLOOD ARM LEFT   Final    Special Requests     Final    Value: BOTTLES DRAWN  AEROBIC AND ANAEROBIC AERO 10CC,ANAE 5CC   Culture  Setup Time 03/23/2012 04:02   Final    Culture NO GROWTH 5 DAYS   Final    Report Status 03/29/2012 FINAL   Final   CULTURE, BLOOD (ROUTINE X 2)     Status: Normal   Collection Time   03/22/12  7:27 PM      Component Value Range Status Comment   Specimen Description BLOOD ARM LEFT   Final    Special Requests BOTTLES DRAWN AEROBIC AND ANAEROBIC 10CC   Final    Culture  Setup Time 03/23/2012 04:02   Final    Culture NO GROWTH 5 DAYS   Final    Report Status 03/29/2012 FINAL   Final   WOUND CULTURE     Status: Normal   Collection Time   03/23/12  2:13 AM      Component Value Range Status Comment   Specimen Description WOUND PENIS   Final    Special Requests Normal   Final    Gram Stain     Final    Value: NO WBC SEEN     FEW SQUAMOUS EPITHELIAL CELLS PRESENT     FEW GRAM NEGATIVE COCCOBACILLI     FEW GRAM POSITIVE COCCI IN PAIRS     RARE GRAM NEGATIVE RODS   Culture     Final    Value: MULTIPLE ORGANISMS PRESENT, NONE PREDOMINANT     Note: NO STAPHYLOCOCCUS AUREUS ISOLATED NO GROUP A STREP (S.PYOGENES) ISOLATED   Report Status 03/26/2012 FINAL   Final   EYE CULTURE     Status: Normal   Collection Time   03/23/12  2:13 AM      Component Value Range Status Comment   Specimen Description EYE   Final    Special Requests Normal   Final    Culture NO GROWTH 2 DAYS   Final    Report Status 03/25/2012 FINAL   Final   MRSA PCR SCREENING     Status: Normal   Collection Time   03/23/12  4:50 PM      Component Value Range Status Comment   MRSA by PCR NEGATIVE  NEGATIVE Final   GC/CHLAMYDIA PROBE AMP     Status: Normal   Collection Time   03/23/12  7:33  PM      Component Value Range Status Comment   CT Probe RNA NEGATIVE  NEGATIVE Final    GC Probe RNA NEGATIVE  NEGATIVE Final   URINE CULTURE     Status: Normal (Preliminary result)   Collection Time   03/29/12  8:10 AM      Component Value Range Status Comment   Specimen  Description URINE, CLEAN CATCH   Final    Special Requests NONE   Final    Culture  Setup Time 03/29/2012 16:07   Final    Colony Count 15,000 COLONIES/ML   Final    Culture GRAM NEGATIVE RODS   Final    Report Status PENDING   Incomplete     Studies/Results: No results found.   Assessment/Plan: ARF AIDS  Perianal Abscess with fistula (? crohns)  Total days of antibiotics: 10 (vanco/zosyn ---> ceftriaxone/flagyl)  Have stopped vanco, anti-retroviral therapy to watch his Cr. Slightly better. *Await HLA B 5701 to see if we can change him to epzicom if his Cr improves. *Await UCx (showing GNR), only 15k. stil lpending.  Possible change to augmentin for d/c (dose renally)  If d/c, home with TIW bactrim. Will need early appt with ID clinic for his ART. I filled out his ADAP form yesterday.   Johny Sax Infectious Diseases [phone] 04/01/2012, 12:15 PM   LOS: 10 days

## 2012-04-02 DIAGNOSIS — I10 Essential (primary) hypertension: Secondary | ICD-10-CM

## 2012-04-02 LAB — BASIC METABOLIC PANEL
BUN: 9 mg/dL (ref 6–23)
CO2: 24 mEq/L (ref 19–32)
Calcium: 8.8 mg/dL (ref 8.4–10.5)
Chloride: 106 mEq/L (ref 96–112)
Creatinine, Ser: 2.29 mg/dL — ABNORMAL HIGH (ref 0.50–1.35)
GFR calc Af Amer: 38 mL/min — ABNORMAL LOW (ref >90)
Glucose, Bld: 110 mg/dL — ABNORMAL HIGH (ref 70–99)
Potassium: 3.6 mEq/L (ref 3.5–5.1)

## 2012-04-02 LAB — URINE CULTURE: Colony Count: 15000

## 2012-04-02 LAB — CBC
HCT: 26.1 % — ABNORMAL LOW (ref 39.0–52.0)
Hemoglobin: 8.4 g/dL — ABNORMAL LOW (ref 13.0–17.0)
MCV: 82.9 fL (ref 78.0–100.0)
RBC: 3.15 MIL/uL — ABNORMAL LOW (ref 4.22–5.81)
RDW: 17.6 % — ABNORMAL HIGH (ref 11.5–15.5)
WBC: 6.1 10*3/uL (ref 4.0–10.5)

## 2012-04-02 NOTE — Progress Notes (Signed)
6 Days Post-Op  Subjective: Still complaining of some dizziness getting up and some nausea with some of his pills.    Objective: Vital signs in last 24 hours: Temp:  [97.9 F (36.6 C)-98.3 F (36.8 C)] 97.9 F (36.6 C) (12/29 0544) Pulse Rate:  [71-89] 87  (12/29 0544) Resp:  [18-19] 19  (12/29 0544) BP: (124-136)/(86-96) 136/96 mmHg (12/29 0544) SpO2:  [98 %-100 %] 100 % (12/29 0544) Last BM Date: 04/01/12  Diet low fiber, I/O not recorded. Afebrile, BP still up,. Labs stable  Intake/Output from previous day: 12/28 0701 - 12/29 0700 In: 390 [P.O.:240; IV Piggyback:150] Out: 2400 [Urine:1500; Stool:900] Intake/Output this shift: Total I/O In: -  Out: 400 [Urine:400]  General appearance: alert, cooperative and no distress GI: soft, non-tender; bowel sounds normal; no masses,  no organomegaly and Ostomy is working well, incsions are doing well.  no distension, he's taking PO's well. Skin: The perirectal fissures are clean and the groin incision is also clean.  Less purulence on dressing this AM.  Lab Results:   Basename 04/02/12 0432 04/01/12 0438  WBC 6.1 6.0  HGB 8.4* 7.5*  HCT 26.1* 23.9*  PLT 271 255    BMET  Basename 04/02/12 0432 04/01/12 0438  NA 138 136  K 3.6 4.0  CL 106 105  CO2 24 23  GLUCOSE 110* 85  BUN 9 9  CREATININE 2.29* 2.44*  CALCIUM 8.8 8.8   PT/INR No results found for this basename: LABPROT:2,INR:2 in the last 72 hours   Lab 03/29/12 2020  AST --  ALT --  ALKPHOS --  BILITOT --  PROT --  ALBUMIN 1.5*     Lipase  No results found for this basename: lipase     Studies/Results: No results found.  Medications:    . antiseptic oral rinse  15 mL Mouth Rinse BID  . cefTRIAXone (ROCEPHIN)  IV  2 g Intravenous Q24H  . heparin subcutaneous  5,000 Units Subcutaneous Q8H  . metroNIDAZOLE  500 mg Oral Q8H  . sodium chloride  3 mL Intravenous Q12H    Assessment/Plan status post drainage of multiple perineal, perianal and  pararectal abscesses 03/23/12 Dr. Johna Sheriff;  s/p laparoscopic assisted sigmoid colostomy, 03/27/2012, Currie Paris, MD (POD5)  HIV (human immunodeficiency virus infection)  Acute renal failure  Septic shock/Hypotension  Colitis/Diverticulosis of colon  Penile ulcer  Anemia  Bilat pleural effusions/opacity,right greater than left   Plan:  From a surgical standpoint he's doing well. The open areas are all clean and look good.  He is tolerating low fiber diet, with normal ostomy function.  We will defer medical management to medical service.  I would push him moving more.    LOS: 11 days    Leon Nichols 04/02/2012

## 2012-04-02 NOTE — Progress Notes (Signed)
I have seen and examined the patient and agree with the assessment and plans.  Daylynn Stumpp A. Lisel Siegrist  MD, FACS  

## 2012-04-02 NOTE — Progress Notes (Signed)
Subjective: Patient is POD6 for diverting colostomy and POD10 from I&D of perianal abscesses.  He had some complaints of nausea this morning but no vomiting. Has been changing ostomy bag and was able to shower yesterday. I have encouraged him to get up and walk more today.   Objective: Vital signs in last 24 hours: Filed Vitals:   04/01/12 0518 04/01/12 1430 04/01/12 2229 04/02/12 0544  BP: 143/88 124/86 134/96 136/96  Pulse: 79 89 71 87  Temp: 98.1 F (36.7 C) 98.3 F (36.8 C) 98.1 F (36.7 C) 97.9 F (36.6 C)  TempSrc: Oral Oral Oral   Resp: 16 18 18 19   Height:      Weight:      SpO2: 100% 98% 100% 100%   Weight change:   Intake/Output Summary (Last 24 hours) at 04/02/12 0924 Last data filed at 04/02/12 0715  Gross per 24 hour  Intake    240 ml  Output   2800 ml  Net  -2560 ml   Vitals reviewed.  General: watching TV, NAD  HEENT: PERRL, EOMI, no scleral icterus  Cardiac: RRR, no rubs, murmurs or gallops  Pulm: clear to auscultation bilaterally, no wheezes, rales, or rhonchi  Abd: soft, nontender, nondistended, normoactive BS. Ostomy over lower abdomen with brown stool, non bloody   Ext: warm and well perfused, no pedal edema Neuro: alert and oriented X3, cranial nerves II-XII grossly intact, strength and sensation to light touch equal in bilateral upper and lower extremities  Skin: Flesh colored 1cm papules on b/l upper extremities. Perineal and anal wounds with gauze dressing in place, no active blood or drainage visualized.  Lab Results: Basic Metabolic Panel:  Lab 04/02/12 8657 04/01/12 0438 03/29/12 2020  NA 138 136 --  K 3.6 4.0 --  CL 106 105 --  CO2 24 23 --  GLUCOSE 110* 85 --  BUN 9 9 --  CREATININE 2.29* 2.44* --  CALCIUM 8.8 8.8 --  MG -- -- --  PHOS -- -- 3.4   Liver Function Tests:  Lab 03/29/12 2020  AST --  ALT --  ALKPHOS --  BILITOT --  PROT --  ALBUMIN 1.5*   CBC:  Lab 04/02/12 0432 04/01/12 0438 03/27/12 0900  WBC 6.1 6.0 --    NEUTROABS -- -- 6.2  HGB 8.4* 7.5* --  HCT 26.1* 23.9* --  MCV 82.9 82.4 --  PLT 271 255 --   Urinalysis:  Lab 03/29/12 0810  COLORURINE AMBER*  LABSPEC 1.016  PHURINE 5.0  GLUCOSEU NEGATIVE  HGBUR MODERATE*  BILIRUBINUR MODERATE*  KETONESUR 15*  PROTEINUR 30*  UROBILINOGEN 1.0  NITRITE NEGATIVE  LEUKOCYTESUR MODERATE*   Micro Results: Recent Results (from the past 240 hour(s))  MRSA PCR SCREENING     Status: Normal   Collection Time   03/23/12  4:50 PM      Component Value Range Status Comment   MRSA by PCR NEGATIVE  NEGATIVE Final   GC/CHLAMYDIA PROBE AMP     Status: Normal   Collection Time   03/23/12  7:33 PM      Component Value Range Status Comment   CT Probe RNA NEGATIVE  NEGATIVE Final    GC Probe RNA NEGATIVE  NEGATIVE Final   URINE CULTURE     Status: Normal (Preliminary result)   Collection Time   03/29/12  8:10 AM      Component Value Range Status Comment   Specimen Description URINE, CLEAN CATCH   Final  Special Requests NONE   Final    Culture  Setup Time 03/29/2012 16:07   Final    Colony Count 15,000 COLONIES/ML   Final    Culture GRAM NEGATIVE RODS   Final    Report Status PENDING   Incomplete    Medications: I have reviewed the patient's current medications. Scheduled Meds:    . antiseptic oral rinse  15 mL Mouth Rinse BID  . cefTRIAXone (ROCEPHIN)  IV  2 g Intravenous Q24H  . heparin subcutaneous  5,000 Units Subcutaneous Q8H  . metroNIDAZOLE  500 mg Oral Q8H  . sodium chloride  3 mL Intravenous Q12H   Continuous Infusions:   PRN Meds:.acetaminophen, fentaNYL, ondansetron (ZOFRAN) IV, oxyCODONE, sodium chloride, temazepam  Assessment/Plan: Mr. Bentsen is a 46 year old African American male with new diagnosis of HIV admitted for treatment of perianal abscesses and fistulas.  1) Perianal abscess and fistula Patient is POD10 /p surgical exploration, irrigation and debridement of perineal and perianal fistulae and is POD6 s/p diverting  colostomy. Patient tolerated procedure and postoperative period well. Good ostomy functioning. Tolerating diet well. No evidence of postoperative infection.  -wound cultures: multiple organisms present, none predominant, no staph aureus or group a strep isolated  -ID following--continue Rocephin day 7, and oral flagyl day 7--total day of abx 12.  Likely transition to augmentin (renally dosed) on discharge.  -Surgery following--good ostomy output, now on low fiber diet, sitz baths and dressing changes. Bridge to be removed from ostomy tomorrow in anticipation of discharge - Some concern for Crohn's picture given fistulae. Per Dr. Marjorie Smolder w GI, may consider CT enterography in the future for further investigation. Would hold on serological testing at this time and correlate clinically. Will arrange outpatient f/u  2) Acute Renal Failure Likely 2/2 nephrotoxic medications (vanc, truvada, bactrim). Cr peaked at 2.51 but has improved with discontinuation of nephrotoxic medications and is 2.29 today. UOP good. Renal u/s did not reveal any pathology. Holding IVF in setting of bilateral pleural effusions.  - continue to follow Cr  3) Anemia Hb 7.4 on admission. Transfused total of 4 units PRBCs during hospital course thus far. His Hb has been stable postoperatively. This morning it is 8.4.  - Daily CBCs - Transfuse if <7   4) HIV, new diagnosis HIV 1 RNA Quant: 61754 and CD4 count 174. ID following: holding all antiretovirals in setting of ARF. Per Dr. Ninetta Lights, await HLA B 5701 to see if epzicom would be option if his Cr improves. - Will have f/u in IV clinic - TIW bactrim for PCP prophylaxis  5) Hypotension--resolved. Holding home dose lisinopril    6) Septic shock--resolved. Was under care of PCCM, transferred back to IMTS 03/27/12.   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 No active lesions apparent now.  -continue  to monitor   Diet: low fiber--salt resticted  Dvt Ppx: Heparin  Dispo: Disposition is deferred at this time, awaiting improvement of current medical problems.  The patient does have a current PCP (MARTIN,NYKEDTRA, NP), therefore will not be requiring OPC follow-up after discharge.  The patient does not know have transportation limitations that hinder transportation to clinic appointments.  .Services Needed at time of discharge: Y = Yes, Blank = No PT:   OT:   RN:   Equipment:   Other:     LOS: 11 days   Bronson Curb 04/02/2012, 9:24 AM

## 2012-04-03 LAB — BASIC METABOLIC PANEL
BUN: 7 mg/dL (ref 6–23)
Chloride: 105 mEq/L (ref 96–112)
Creatinine, Ser: 1.98 mg/dL — ABNORMAL HIGH (ref 0.50–1.35)
GFR calc non Af Amer: 39 mL/min — ABNORMAL LOW (ref >90)
Glucose, Bld: 90 mg/dL (ref 70–99)
Potassium: 3.6 mEq/L (ref 3.5–5.1)

## 2012-04-03 LAB — CBC
HCT: 26.7 % — ABNORMAL LOW (ref 39.0–52.0)
Hemoglobin: 8.5 g/dL — ABNORMAL LOW (ref 13.0–17.0)
MCHC: 31.8 g/dL (ref 30.0–36.0)
MCV: 82.4 fL (ref 78.0–100.0)

## 2012-04-03 MED ORDER — OXYCODONE HCL 10 MG PO TABS: 10.0000 mg | ORAL_TABLET | Freq: Four times a day (QID) | ORAL | Status: DC | PRN

## 2012-04-03 MED ORDER — SULFAMETHOXAZOLE-TRIMETHOPRIM 800-160 MG PO TABS: ORAL_TABLET | ORAL | Status: DC

## 2012-04-03 MED ORDER — AMOXICILLIN-POT CLAVULANATE 875-125 MG PO TABS: 1.0000 | ORAL_TABLET | Freq: Two times a day (BID) | ORAL | Status: DC

## 2012-04-03 NOTE — Progress Notes (Signed)
Subjective: Patient is POD7 for diverting colostomy and POD11 from I&D of perianal abscesses.  No complaints today. Feels ready to go home. Is changing dressing on his own and says he is starting to feel more comfortable with ostomy care.  Denies CP, SOB, N/V, dizziness, abdominal pain, bleeding.   Objective: Vital signs in last 24 hours: Filed Vitals:   04/02/12 0544 04/02/12 1435 04/02/12 2226 04/03/12 0516  BP: 136/96 147/102 133/93 121/52  Pulse: 87 87 81 46  Temp: 97.9 F (36.6 C) 98.4 F (36.9 C) 97.7 F (36.5 C) 97.5 F (36.4 C)  TempSrc:  Oral Oral   Resp: 19 18 19 17   Height:      Weight:      SpO2: 100% 100% 100% 100%   Weight change:   Intake/Output Summary (Last 24 hours) at 04/03/12 0755 Last data filed at 04/03/12 0515  Gross per 24 hour  Intake   1200 ml  Output   1850 ml  Net   -650 ml   Vitals reviewed.  General:sitting up in bed HEENT: PERRL, EOMI, no scleral icterus  Cardiac: RRR, no rubs, murmurs or gallops  Pulm: clear to auscultation bilaterally, no wheezes, rales, or rhonchi  Abd: soft, nontender, nondistended, normoactive BS. Ostomy over lower abdomen with brown stool, non bloody   Ext: warm and well perfused, 1+ pedal edema Neuro: alert and oriented X3, cranial nerves II-XII grossly intact, strength and sensation to light touch equal in bilateral upper and lower extremities  Skin: Flesh colored 0.5-1cm papules on b/l upper extremities. Perineal and anal wounds with gauze dressing in place, no active blood or drainage visualized.  Lab Results: Basic Metabolic Panel:  Lab 04/02/12 1610 04/01/12 0438 03/29/12 2020  NA 138 136 --  K 3.6 4.0 --  CL 106 105 --  CO2 24 23 --  GLUCOSE 110* 85 --  BUN 9 9 --  CREATININE 2.29* 2.44* --  CALCIUM 8.8 8.8 --  MG -- -- --  PHOS -- -- 3.4   Liver Function Tests:  Lab 03/29/12 2020  AST --  ALT --  ALKPHOS --  BILITOT --  PROT --  ALBUMIN 1.5*   CBC:  Lab 04/03/12 0535 04/02/12 0432 03/27/12  0900  WBC 7.1 6.1 --  NEUTROABS -- -- 6.2  HGB 8.5* 8.4* --  HCT 26.7* 26.1* --  MCV 82.4 82.9 --  PLT 309 271 --   Urinalysis:  Lab 03/29/12 0810  COLORURINE AMBER*  LABSPEC 1.016  PHURINE 5.0  GLUCOSEU NEGATIVE  HGBUR MODERATE*  BILIRUBINUR MODERATE*  KETONESUR 15*  PROTEINUR 30*  UROBILINOGEN 1.0  NITRITE NEGATIVE  LEUKOCYTESUR MODERATE*   Micro Results: Recent Results (from the past 240 hour(s))  URINE CULTURE     Status: Normal   Collection Time   03/29/12  8:10 AM      Component Value Range Status Comment   Specimen Description URINE, CLEAN CATCH   Final    Special Requests NONE   Final    Culture  Setup Time 03/29/2012 16:07   Final    Colony Count 15,000 COLONIES/ML   Final    Culture [facility]   Final    Report Status 04/02/2012 FINAL   Final    Organism ID, Bacteria [facility] AN[facility]   Final    Medications: I have reviewed the patient's current medications. Scheduled Meds:    . cefTRIAXone (ROCEPHIN)  IV  2 g Intravenous Q24H  . heparin subcutaneous  5,000 Units Subcutaneous  Q8H  . metroNIDAZOLE  500 mg Oral Q8H  . sodium chloride  3 mL Intravenous Q12H   Continuous Infusions:   PRN Meds:.acetaminophen, fentaNYL, ondansetron (ZOFRAN) IV, oxyCODONE, sodium chloride, temazepam  Assessment/Plan: Mr. Kloster is a 46 year old African American male with new diagnosis of HIV admitted for treatment of perianal abscesses and fistulae.  1) Perianal abscess and fistulae Patient is POD11 /p surgical exploration, irrigation and debridement of perineal and perianal fistulae and is POD7 s/p diverting colostomy. Patient tolerated procedure and postoperative period well. Good ostomy functioning. Tolerating diet well. No evidence of postoperative infection.  -wound cultures: multiple organisms present, none predominant, no staph aureus or group a strep isolated  -ID following--continue Rocephin day 8, and oral flagyl day 8--total day of abx 12.  Likely  transition to augmentin (renally dosed) on discharge.  -Surgery following--good ostomy output, now on low fiber diet, sitz baths and dressing changes. Bridge to be removed today by surgery - Some concern for Crohn's picture given fistulae. Per Dr. Marjorie Smolder w GI, may consider CT enterography in the future for further investigation. Would hold on serological testing at this time and correlate clinically. Will arrange outpatient f/u  2) Acute Renal Failure Likely 2/2 nephrotoxic medications (vanc, truvada, bactrim). Cr peaked at 2.51 but has improved with discontinuation of nephrotoxic medications and is 1.98 today. UOP good. Renal u/s did not reveal any pathology. Holding IVF in setting of bilateral pleural effusions.  - continue to follow Cr - Hold nephrotoxic drugs pending renal recovery  3) Anemia Hb 7.4 on admission. Transfused total of 4 units PRBCs during hospital course thus far. His Hb has been stable postoperatively. This morning it is 8.5.  - Daily CBCs - Transfuse if <7   4) HIV, new diagnosis HIV 1 RNA Quant: [id number] and CD4 count 174. ID following: holding all antiretovirals in setting of ARF. Per Dr. Ninetta Lights, await HLA B 5701 to see if epzicom would be option if his Cr improves. - Will have f/u in ID clinic - TIW bactrim for PCP prophylaxis  5) Hypotension--resolved. Holding home dose lisinopril in setting of AKI. Patient w normotensives BPs.   6) Septic shock--resolved. Was under care of PCCM, transferred back to IMTS 03/27/12.   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 No active lesions apparent now.  -continue to monitor   Diet: low fiber--salt resticted  Dvt Ppx: Heparin  Dispo: Disposition is deferred at this time, awaiting improvement of current medical problems.  The patient does have a current PCP (MARTIN,NYKEDTRA, NP), therefore will not be requiring OPC follow-up after discharge.  The  patient does not know have transportation limitations that hinder transportation to clinic appointments.  .Services Needed at time of discharge: Y = Yes, Blank = No PT:   OT:   RN:   Equipment:   Other:     LOS: 12 days   Bronson Curb 04/03/2012, 7:55 AM

## 2012-04-03 NOTE — Progress Notes (Signed)
7 Days Post-Op  Subjective: Doing well, Medicine plans to send him home today.  He's doing well with dressing changes.  Needs some help with the ostomy.  Objective: Vital signs in last 24 hours: Temp:  [97.5 F (36.4 C)-98.4 F (36.9 C)] 97.5 F (36.4 C) (12/30 0516) Pulse Rate:  [46-87] 46  (12/30 0516) Resp:  [17-19] 17  (12/30 0516) BP: (121-147)/(52-102) 121/52 mmHg (12/30 0516) SpO2:  [100 %] 100 % (12/30 0516) Last BM Date: 04/02/12  Diet: low fiber, BP still up, WBC is normal,   Intake/Output from previous day: 12/29 0701 - 12/30 0700 In: 1200 [P.O.:1200] Out: 2250 [Urine:2050; Stool:200] Intake/Output this shift:    General appearance: alert, cooperative and no distress GI: soft, non-tender; bowel sounds normal; no masses,  no organomegaly and colostomy is working well. Skin: Skin color, texture, turgor normal. No rashes or lesions or Open areas are all clean and pink, the dressing has fallen out of a couple, and it's there is a little drainage and crusting in these , but the wounds are pink and clean otherwise.  Lab Results:   [facility] 04/03/12 0535 04/02/12 0432  WBC 7.1 6.1  HGB 8.5* 8.4*  HCT 26.7* 26.1*  PLT 309 271    BMET  Basename 04/02/12 0432 04/01/12 0438  NA 138 136  K 3.6 4.0  CL 106 105  CO2 24 23  GLUCOSE 110* 85  BUN 9 9  CREATININE 2.29* 2.44*  CALCIUM 8.8 8.8   PT/INR No results found for this basename: LABPROT:2,INR:2 in the last 72 hours   Lab 03/29/12 2020  AST --  ALT --  ALKPHOS --  BILITOT --  PROT --  ALBUMIN 1.5*     Lipase  No results found for this basename: lipase     Studies/Results: No results found.  Medications:    . cefTRIAXone (ROCEPHIN)  IV  2 g Intravenous Q24H  . heparin subcutaneous  5,000 Units Subcutaneous Q8H  . metroNIDAZOLE  500 mg Oral Q8H  . sodium chloride  3 mL Intravenous Q12H    Assessment/Plan status post drainage of multiple perineal, perianal and pararectal abscesses 03/23/12  Dr. Johna Nichols; s/p laparoscopic assisted sigmoid colostomy, 03/27/2012, Leon Paris, MD (POD5)  HIV (human immunodeficiency virus infection)  Acute renal failure  Septic shock/Hypotension  Colitis/Diverticulosis of colon  Penile ulcer  Anemia  Bilat pleural effusions/opacity,right greater than left  Plan:  Ostomy nurse is coming to help him do change of the ostomy again and I will take the bridge out at that time. I have told him to continue to shower or stiz bath several times per day, repack wounds, keep clean and allow to heal from the base up.  i will leave follow up info  with Dr. Jamey Nichols , on the AVS. He will need home health to help with the ostomy too. Bar removed without difficulty, the ostomy has some yellow colored tissue, looks like fat on it.  I have told him to call for follow up in 1 week if this does not turn pink shortly.     LOS: 12 days    Leon Nichols 04/03/2012

## 2012-04-03 NOTE — Consult Note (Signed)
WOC ostomy consult  Stoma type/location:  Ostomy to left lower quad.  [facility] PA in at bedside to remove red rubber rod and assess stoma appearance. Stomal assessment/size: Stoma 2 inches, pale yellow, above skin level, located in deep crease at 9 o'clock. Peristomal assessment: Intact skin surrounding. Output  50cc liquid brown stool. Ostomy pouching: 1pc flexible with barrier ring. Education provided: Pt assisted with pouch application using one piece flexible pouch and barrier ring to maintain seal..  Able to empty and close with velcro.  Reviewed pouching routines and ordering supplies.  Pt denies further questions at this time.  Recommend home health for further assistance. Supplies at bedside and pt has been placed on [facility] discharge program.   Cammie Mcgee, RN, MSN, [employer]  [phone]

## 2012-04-03 NOTE — Progress Notes (Signed)
General Surgery [facility]  Plans for discharge noted.  Follow up at [facility] office arranged.  Velora Heckler, MD, [facility] Office: [phone]

## 2012-04-03 NOTE — Progress Notes (Signed)
DC HOME, VERBALLY UNDERSTOOD DC INSTRUCTIONS, NO QUESTIONS ASKED, DRESSING SUPPLIES SENT HOME WITH PT, STATES HE FEELS COMFORTABLE IN CARING FOR OSTOMY.

## 2012-04-03 NOTE — Discharge Summary (Signed)
Internal Medicine Teaching [facility] Discharge Note  Name: Leon Nichols MRN: [id number] DOB: Sep 07, 1965 46 y.o.  Date of Admission: 03/22/2012  6:03 PM Date of Discharge: 04/03/2012 Attending Physician: Inez Catalina, MD  Discharge Diagnosis: 1) Perineal and perianal abscesses and fistulae, complicated by septic shock 2) HIV/AIDS, new diagnosis 3) Acute renal failure, drug-induced 4) Normocytic anemia 5 ) History of HSV/Zoster      Discharge Medications:   Medication List     As of 04/03/2012 10:21 AM    STOP taking these medications         lisinopril 40 MG tablet   Commonly known as: PRINIVIL,ZESTRIL      TAKE these medications         amoxicillin-clavulanate 875-125 MG per tablet   Commonly known as: AUGMENTIN   Take 1 tablet by mouth 2 (two) times daily.      Oxycodone HCl 10 MG Tabs   Take 1 tablet (10 mg total) by mouth every 6 (six) hours as needed.      sulfamethoxazole-trimethoprim 800-160 MG per tablet   Commonly known as: BACTRIM DS,SEPTRA DS   Take one tablet 3 times per week (Monday, Wednesday, Friday).        Disposition and follow-up:   Leon Nichols was discharged from [facility] in good condition.  At the hospital follow up visit please address:   1) Perianal/perineal abscesses/fistulae s/p diverting colostomy Patient now with diverting colostomy due to extensive perianal disease and fistulae. He is on oral antibiotic therapy with Augmentin. He will have an appointment in the ID clinic scheduled for the same day as his hospital follow-up on 04/11/11. Please assess healing of perianal/perineal abscesses.  There was some concern on imaging that perianal fistulae and colonic disease represent inflammatory bowel disease. The inpatient team spoke with Dr. Marjorie Smolder who recommended GI follow up after discharge, with possible CT enterography. Please make referral to GI for outpatient follow up. He will likely need to go  GI  as he is uninsured.  2) HIV, new diagnosis HIV 1 RNA Quant: 61754 and CD4 count 174. Started on truvada during admission but had to discontinue secondary to acute renal failure. Has ID appt for 04/11/11, ART to be initiated pending recovery of renal function. HLA B 5701 pending to see if epzicom would be option if his Cr improves. He is on 3x weekly bactrim for PCP prophylaxis.   3) Acute renal failure Thought 2/2 nephrotoxic drugs (vanc, truvada, bactrim). Meds discontinued and renal func improved (2.5-->1.9 day of discharge). Please draw bmet during appointment and assess recovery of renal function.   4) Hypertension Lisinopril held on discharge due to ARF. Please assess renal function and readiness to resume ACE-i therapy.   5) Normocytic anemia Pt w Hb 7.4 on admission, received 2U pRBCs while inpatient. Possibly multifactorial, blood loss from rectal dz + HIV. Please check hemoglobin/hematocrit assess need for initiation of iron supplementation.   Follow-up Appointments: Follow-up Information    Follow up with Currie Paris, MD. Schedule an appointment as soon as possible for a visit in 2 weeks. (Continue to shower or do sitz baths several times per day, and repack wet to dry.  )    Contact information:   [address] [place] [address] [phone]       Follow up with Lorretta Harp, MD. On 04/10/2012. (Your appoinment is at 2:45pm in the Internal Medicine clinic, located on the ground floor of  the hospital.)    Contact information:   Ground floor hospital [address] [place] [address] [phone]       Follow up with [name], MD. On 04/10/2012. (Please go to your appointment with the infectious disease doctor at 4:15 pm. The clinic is across the street from the hospital. )    Contact information:   [address] [place] [address] [phone]       Please follow up. (If your ostomy does not appear to be mostly pink call  for a follow up appointment with Dr. Jamey Ripa in 1 week.)         Discharge Orders    Future Appointments: Provider: Department: Dept Phone: Center:   04/10/2012 2:45 PM Lorretta Harp, MD [facility] [phone] [facility]   04/10/2012 4:15 PM [name], MD [facility] [phone] [facility]     Future Orders Please Complete By Expires   Diet - low sodium heart healthy      Increase activity slowly      Call MD for:  temperature >100.4      Call MD for:  persistant nausea and vomiting      Call MD for:  severe uncontrolled pain      Call MD for:  redness, tenderness, or signs of infection (pain, swelling, redness, odor or green/yellow discharge around incision site)      Call MD for:  hives      Call MD for:  difficulty breathing, headache or visual disturbances      Call MD for:  persistant dizziness or light-headedness         Consultations:   [facility] Infectious Disease Critical Care  Procedures Performed:  Dg Chest 2 View  03/29/2012  *RADIOLOGY REPORT*  Clinical Data: Evaluate pleural effusion, weakness, shortness of breath  CHEST - 2 VIEW  Comparison: 03/24/2012; 03/23/2012; 09/23/2011  Findings:  Examination is degraded secondary to patient positioning.  There is an unchanged cardiac silhouette and mediastinal contours to the decreased lung volumes and patient rotation.  Mild pulmonary congestion without frank evidence of edema.  Trace bilateral pleural effusions.  Bibasilar heterogeneous opacities, right greater than left.  No pneumothorax.  Unchanged bones.  IMPRESSION: 1.  Trace bilateral effusions and bibasilar opacities, right greater than left, atelectasis versus infiltrate. 2. Pulmonary venous congestion without frank evidence of edema.   Original Report Authenticated By: Tacey Ruiz, MD     Ct Pelvis W Contrast  03/23/2012  *RADIOLOGY REPORT*  Clinical Data:  Suprapubic/perianal region wounds.  CT  PELVIS WITH CONTRAST  Technique:  Multidetector CT imaging of the pelvis was performed using the standard protocol following the bolus administration of intravenous contrast.  Contrast: OMNIPAQUE IOHEXOL 300 MG/ML  SOLN  Comparison:  08/29/2011  Findings:  Visualized intrapelvic contents show pericolonic fat stranding at the descending colon with prominent adjacent lymph nodes up to 7 mm short axis.  The previously described cutaneous ulcer and fissure along the left side of the back, extending perianal and toward the coccyx is again noted, with interval increased associated gas.  The process is in communication with tracks of gas and soft tissue coursing along the posterior subcutaneous fat and anteriorly to involve the perineum and suprapubic region. There is no associated loculated/drainable fluid collection.  Prominent inguinal lymph nodes, measuring up to 1.5 cm on the left.  No acute osseous finding.  IMPRESSION: Multiple gas containing tracks  within the posterior subcutaneous tissues, perineum, and suprapubic region.  Centrally, the tracks extend perianal and to abut the coccyx. If acute, conditions such as necrotizing fasciitis should be excluded.  If chronic, recommend correlation with the patient's immune status for inflammatory conditions such as Crohn disease.  Inguinal lymphadenopathy may be reactive.  Lymphoma should be excluded.  Pericolonic fat stranding and wall thickening involving the descending colon.  Prominent pericolonic lymph nodes.  Differential for this appearance also includes inflammatory bowel disease and lymphoma.  Correlate with colonoscopy if not yet performed.  Discussed via telephone with the admitting service at 01:40 a.m. on 03/23/2012.  Reportedly, this is a chronic process that is progressing, and the patient has had a recent colonoscopy.   Original Report Authenticated By: Jearld Lesch, M.D.     US Renal  03/29/2012  *RADIOLOGY REPORT*  Clinical Data: Acute renal  failure postoperatively  RENAL/URINARY TRACT ULTRASOUND COMPLETE  Comparison:  CT abdomen 03/23/2012  Findings:  Right Kidney:  12.8 cm. No hydronephrosis or focal mass.  Left Kidney:  12.3 cm. No hydronephrosis or focal mass.  Bladder:  The bladder is decompressed but otherwise unremarkable.  Right sided pleural effusion partly imaged.  IMPRESSION: Normal exam.  Right pleural effusion partly visualized.   Original Report Authenticated By: Christiana Pellant, M.D.     Dg Chest Port 1 View  03/24/2012  *RADIOLOGY REPORT*  Clinical Data: Shortness of breath, evaluate endotracheal tube position  PORTABLE CHEST - 1 VIEW  Comparison: Portable chest x-ray of 03/23/2012  Findings: The lungs are not quite as well aerated with increase in basilar atelectasis.  No endotracheal tube is visible.  The left central venous line is unchanged in position.  Heart size is stable.  IMPRESSION: No endotracheal tube is seen.  The lungs are poorly aerated with slight increase in basilar atelectasis.   Original Report Authenticated By: Dwyane Dee, M.D.     Dg Chest Port 1 View  03/23/2012  *RADIOLOGY REPORT*  Clinical Data: Central line placement.  PORTABLE CHEST - 1 VIEW  Comparison: Earlier film, same date.  Findings: The left subclavian central venous catheter tip is in the mid SVC at the level of the carina.  Low lung volumes with vascular crowding and atelectasis.  No pneumothorax.  IMPRESSION:  1.  Left subclavian central venous catheter tip is in the mid SVC. No complicating features. 2.  Low lung volumes with vascular crowding and atelectasis.   Original Report Authenticated By: Rudie Meyer, M.D.    Dg Chest Port 1 View  03/23/2012  *RADIOLOGY REPORT*  Clinical Data: Preoperative radiograph for wound debridement.  PORTABLE CHEST - 1 VIEW  Comparison: 09/23/2011  Findings: Mild left lung base opacity, likely scarring or atelectasis.  Otherwise, no confluent airspace opacity, pleural effusion, or pneumothorax.   Cardiomediastinal contours within normal range.  No acute osseous finding.  IMPRESSION: Mild left lung base opacity, likely scarring or atelectasis.   Original Report Authenticated By: Jearld Lesch, M.D.     Admission HPI:  Mr. Fries is a 46 yo AA man with PMH significant for HTN, nonspecific colitis and diverticulosis per colonoscopy in 5/13 by Dr. Marina Goodell, and Herpes Zoster in 6/13 complicated by left Ramsey-Hunt Syndrome who presents to the [facility] ED with rectal pain. The pain started one year ago with a boil in his rectum, he was seen at an Urgent care initially, given some antibiotics and some improvement. He was admitted to the [facility] ED in May with colonoscopy showing  colitis but no Crohn's, and was treated with antibiotics. He was admitted again in June for Herpes Zoster complicated by Ramsey-Hunt Syndrome and also received treatment for his perianal abscess and colitis. He does not have a PCP and has not been able to follow up with GI. In September he had one ED visit for evaluation of itchy, painful, superficial lesions on his penis, suprapubic, and scrotal area thought to be likely herpetic with superimposed infection. The lesions had started as a suprapubic boil which he treated with pork salt and vaseline. He was treated with acyclovir and Keflex and discharged home. Both the pararectal and suprapubic skin infections continued to have discharge with increased pain that worsened in the past month. He has been cleaning the suprapubic wound with hydrogen peroxide. He has noticed that when he walks the discharge from his suprapubic wound increases in quantity and he has increased pain. It is difficult for him to keep the wound covered up with gauze because of its location. He has been unable to work for the last 2-3 months secondary to pain from his wounds. He has had boils under his arms but they have gone away after they self-drained. He has had skin boils since he started drinking at age of 33. He has a  chronic papular rash with eschars over his arms, buttocks, and legs.  He has had dizziness, he feels weak, with no energy to stand up and cook. His appetite is normal but he reports a 30-40Lbs weight loss. He feels cold often. He has a BM every 1-2 days with bright red blood per rectum with amount described as several spoonfulls. He describes a sense of "pooling" when he has a BM and he is unable to sit down to [place], he has to stand to have a BM.  He denies fever, chills, cough, shortness of breath, sore throat, chest pain or palpitations, abdominal pain, constipation, diarrhea, or dysuria.  He has been without a drink for two months. He used to drink 1/5 of a pint and a 12 pack in one sitting.  He has no family history of colon cancer of colitis.   Hospital Course by problem list:  1) Perineal/perianal abscess complicated by septic shock On physical exam, patient was noted to have to have numerous skin openings/abscesses in perirectal, perineum, scrotal, and penile region with malodorous purulent discharge. CT scan demonstrated multiple gas containing tracks within the posterior subcutaneous tissues, perineum, and suprapubic region tracking to the coccyx. On the night of admission, patient was with sustained hypotension, fever, and tachycardia and was transferred to the Critical Care Service for treatment of septic shock. He was transfused 2U pRBCs and started on empiric antimicrobial coverage with vancomycin, zosyn, clindamycin, and acyclovir. On hospital day 2 (03/23/12), he underwent irrigation and debridement of perirectal abscesses/fistulae by Dr. Johna Sheriff. During the debridement, Dr. Johna Sheriff noted extensive unusual perirectal fistulas disease and recommended divergent colostomy.  Patient's wound cultures grew out multiple organisms, non predominant, no staph. Urine culture grew 15,000 CFU of onchrobactrum anthropi. ID was consulted as HIV Ab returned positive (see problem #2) and recommended  discontinuing clindamycin and acyclovir, and continuing vancomycin and zosyn. Patient was taken off of pressors on 03/25/12. On 03/27/12, he underwent laparoscopic assisted sigmoid colostomy by Dr. Jamey Ripa. He tolerated the procedure well. Postoperatively, he was continued on antibiotic therapy with vancomycin and ceftriaxone as well as flagyl. Vancomycin was discontinued on 03/30/12 in setting of acute renal failure (see problem #3) and patient remained afebrile and  stable on ceftriaxone and flagyl. Patient had an uncomplicated postoperative course. He was instructed on appropriate self-care and dressing changes for open wounds on perineum and anal region. He also demonstrated competence with ostomy care. He was discharged on oral Augmentin with home health services for ostomy care. Follow-up was arranged with Internal Medicine, Infectious Disease, and Surgery. Ultimately, at the time of discharge it is not clear whether or not his longstanding perirectal disease was an outcome of uncontrolled HIV infection, or if there was an additional underlying inflammatory bowel disease. He did have a colonoscopy in May 2013 with inflammatory changes possible consistent with Crohn's disease. We spoke with Dr. Marjorie Smolder, who recommended holding serological testing at this time and correlate clinically. Patient will need additional GI follow up for patient once acute issues resolve. He may require CT enterography at some point in the future.    2) HIV/AIDS, new diagnosis Patient w positive HIV Ab on admission, reported no previous HIV diagnosis. HIV 1 RNA Quant: 61754 and CD4 count 170. Infectious disease saw him and he was started on Truvada and Bactrim for PCP prophylaxis, but these medications had to be discontinued in the setting of acute renal failure (see # 3 below). Patient had been separated from wife for 2 years, and was reluctant to discuss his diagnosis with her.  ADAP application submitted for patient and follow-up  was arranged with the ID clinic within the week for initiation of ART once renal function acceptably recovered. HLA B 5701 Ag was checked to see if he would be candidate to Epzicom upon improvement of creatinine. Results pending at time of discharge. Patient was given prescription for TIW bactrim for PCP prophylaxis upon discharge.  Patient reports that he has arranged his pastor to come to a meeting this week during which he will reveal HIV dx to wife.  3) Acute renal failure Patient admitted with creatinine of 1.21 which increased to a peak of 2.51 on 03/31/12. Renal ultrasound was normal. Acute renal failure 2/2 nephrotoxic medications was suspected, as patient was on therapy with vancomycin, truvada and bactrim. The primary team spoke with Dr. Darrick Penna (renal), who surmised the same. The aforementioned nephrotoxic drugs were discontinued. Cr gradually recovered and was 1.9 at time of discharge. Bactrim for PCP prophylaxis TIW was prescribed on discharge, but patient was instructed to hold home lisinopril until follow up visit. Initiation of ART will also be deferred to outpatient ID appointment pending recovery of renal function.  4) Normocytic anemia Patient admitted w Hb 7.4, received 2U pRBCs on day of admission. Possibly related to blood loss from rectal disease (low iron and iron saturation) vs HIV disease. Hb was stable after transfusion and 8.5 at time of discharge. May require iron therapy as outpatient.   5 ) History of HSV/Zoster  HSV 1 and HSV 2 IgG positive 03/23/12. 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 No active lesions apparent during admission.      Discharge Vitals:  BP 121/52  Pulse 46  Temp 97.5 F (36.4 C) (Oral)  Resp 17  Ht 6' 0.84" (1.85 m)  Wt 208 lb 8.9 oz (94.6 kg)  BMI 27.64 kg/m2  SpO2 100%  Discharge Day Physical Exam: General:sitting up in bed  HEENT: PERRL, EOMI, no scleral icterus  Cardiac: RRR, no rubs, murmurs or gallops    Pulm: clear to auscultation bilaterally, no wheezes, rales, or rhonchi  Abd: soft, nontender, nondistended, normoactive BS. Ostomy over lower abdomen with brown stool, non bloody  Ext: warm and well perfused, 1+ pedal edema  Neuro: alert and oriented X3, cranial nerves II-XII grossly intact, strength and sensation to light touch equal in bilateral upper and lower extremities  Skin: Flesh colored 0.5-1cm papules on b/l upper extremities. Perineal and anal wounds with gauze dressing in place, no active blood or drainage visualized.  Discharge Labs:  Results for orders placed during the hospital encounter of 03/22/12 (from the past 24 hour(s))  CBC     Status: Abnormal   Collection Time   04/03/12  5:35 AM      Component Value Range   WBC 7.1  4.0 - 10.5 K/uL   RBC 3.24 (*) 4.22 - 5.81 MIL/uL   Hemoglobin 8.5 (*) 13.0 - 17.0 g/dL   HCT 41.3 (*) 24.4 - 01.0 %   MCV 82.4  78.0 - 100.0 fL   MCH 26.2  26.0 - 34.0 pg   MCHC 31.8  30.0 - 36.0 g/dL   RDW 27.2 (*) 53.6 - 64.4 %   Platelets 309  150 - 400 K/uL  BASIC METABOLIC PANEL     Status: Abnormal   Collection Time   04/03/12  5:35 AM      Component Value Range   Sodium 138  135 - 145 mEq/L   Potassium 3.6  3.5 - 5.1 mEq/L   Chloride 105  96 - 112 mEq/L   CO2 23  19 - 32 mEq/L   Glucose, Bld 90  70 - 99 mg/dL   BUN 7  6 - 23 mg/dL   Creatinine, Ser 0.34 (*) 0.50 - 1.35 mg/dL   Calcium 8.7  8.4 - 74.2 mg/dL   GFR calc non Af Amer 39 (*) >90 mL/min   GFR calc Af Amer 46 (*) >90 mL/min    Signed: Bronson Curb 04/03/2012, 10:21 AM   Time Spent on Discharge: 60 min Services Ordered on Discharge: Home Health RN Equipment Ordered on Discharge: None

## 2012-04-07 LAB — HLA B*5701

## 2012-04-10 ENCOUNTER — Inpatient Hospital Stay: Payer: Self-pay | Admitting: Internal Medicine

## 2012-04-10 ENCOUNTER — Telehealth: Payer: Self-pay | Admitting: *Deleted

## 2012-04-10 ENCOUNTER — Ambulatory Visit: Payer: Self-pay | Admitting: Internal Medicine

## 2012-04-10 NOTE — Telephone Encounter (Signed)
Returned call to [employer], Charity fundraiser and read what Dr Rogelia Boga wrote.  She will call pt back and tell him. I still have his appointment open if he will come in.

## 2012-04-10 NOTE — Telephone Encounter (Signed)
Spoke to Rodri­guez Hevia. Pt needs seen. If he decides not to be seen, then he will have to take responsibility for any poor outcome he might experience. Without exam, hx, and lab work, I cannot comment on Bactrim. He needs seen today.

## 2012-04-10 NOTE — Telephone Encounter (Signed)
Call from [place], RN with [facility] # [phone] Stating pt is having swelling to scrotum and lower extremities. She states testicles are swollen to the size of tennis balls. Legs have 2 plus edema from knees to feet.  Onset when he started bactrim.  Started 1 week ago.  He is scheduled to take M, W and F. took last dose Friday. He did not take today.   Pt had an appointment today but he cancelled and scheduled for Thursday 1/9 also cancelled appointment with ID. Nurse advised him to be seen today, pt refused but stated he will go to ED if any changes. I also advised evaluation with MD today but pt refused. What should I tell pt about taking Bactrim today and Wednesday?

## 2012-04-13 ENCOUNTER — Encounter: Payer: Self-pay | Admitting: Internal Medicine

## 2012-04-13 ENCOUNTER — Ambulatory Visit (INDEPENDENT_AMBULATORY_CARE_PROVIDER_SITE_OTHER): Payer: Self-pay | Admitting: Internal Medicine

## 2012-04-13 VITALS — BP 145/95 | HR 90 | Temp 97.1°F | Ht 73.0 in | Wt 221.3 lb

## 2012-04-13 DIAGNOSIS — K612 Anorectal abscess: Secondary | ICD-10-CM

## 2012-04-13 DIAGNOSIS — B20 Human immunodeficiency virus [HIV] disease: Secondary | ICD-10-CM

## 2012-04-13 DIAGNOSIS — I10 Essential (primary) hypertension: Secondary | ICD-10-CM

## 2012-04-13 DIAGNOSIS — D649 Anemia, unspecified: Secondary | ICD-10-CM

## 2012-04-13 DIAGNOSIS — N179 Acute kidney failure, unspecified: Secondary | ICD-10-CM

## 2012-04-13 DIAGNOSIS — L0291 Cutaneous abscess, unspecified: Secondary | ICD-10-CM

## 2012-04-13 LAB — BASIC METABOLIC PANEL
BUN: 6 mg/dL (ref 6–23)
CO2: 29 mEq/L (ref 19–32)
Chloride: 103 mEq/L (ref 96–112)
Glucose, Bld: 110 mg/dL — ABNORMAL HIGH (ref 70–99)
Potassium: 3.8 mEq/L (ref 3.5–5.3)
Sodium: 137 mEq/L (ref 135–145)

## 2012-04-13 LAB — CBC WITH DIFFERENTIAL/PLATELET
Basophils Absolute: 0 K/uL (ref 0.0–0.1)
Basophils Relative: 0 % (ref 0–1)
Eosinophils Absolute: 0.1 K/uL (ref 0.0–0.7)
Eosinophils Relative: 3 % (ref 0–5)
HCT: 26.4 % — ABNORMAL LOW (ref 39.0–52.0)
Hemoglobin: 8.7 g/dL — ABNORMAL LOW (ref 13.0–17.0)
Lymphocytes Relative: 20 % (ref 12–46)
Lymphs Abs: 0.9 K/uL (ref 0.7–4.0)
MCH: 26.8 pg (ref 26.0–34.0)
MCHC: 33 g/dL (ref 30.0–36.0)
MCV: 81.2 fL (ref 78.0–100.0)
Monocytes Absolute: 0.4 K/uL (ref 0.1–1.0)
Monocytes Relative: 9 % (ref 3–12)
Neutro Abs: 3.2 K/uL (ref 1.7–7.7)
Neutrophils Relative %: 68 % (ref 43–77)
Platelets: 229 K/uL (ref 150–400)
RBC: 3.25 MIL/uL — ABNORMAL LOW (ref 4.22–5.81)
RDW: 20.2 % — ABNORMAL HIGH (ref 11.5–15.5)
WBC: 4.7 K/uL (ref 4.0–10.5)

## 2012-04-13 MED ORDER — HYDROCODONE-ACETAMINOPHEN 10-325 MG PO TABS: 1.0000 | ORAL_TABLET | Freq: Four times a day (QID) | ORAL | Status: DC | PRN

## 2012-04-13 NOTE — Assessment & Plan Note (Signed)
Continue to hold lisinopril for now.  Can consider restarting at future visits

## 2012-04-13 NOTE — Assessment & Plan Note (Addendum)
Lesions appear to be well-healing, and patient describes good ostomy output. -refer to GI for further evaluation for possible IBD -patient currently without insurance; he has filled out medicaid application, and today was given info on obtaining the [employer] in the interim. -hydrocodone-acetaminophen for pain

## 2012-04-13 NOTE — Progress Notes (Signed)
HPI The patient is a 47 y.o. male with a history of HTN, newly-diagnosed HIV (CD4 = 174), presenting to establish care.  The patient was hospitalized 12/18-12/30 with perineal fistulas, requiring diverting colostomy, and found to have HIV.  Since hospital discharge, the patient notes regular soft, Natelie Ostrosky bowel movements through his ostomy.  He notes less serosanguinous discharge from his perineal abscesses, which are now also less painful.  He still notes some discomfort when sitting down.  The patient does not have an appointment yet with GI to follow-up (previously saw Dr. Marina Goodell for colonoscopy 08/2011).  The patient has an appointment in 5 days with ID.  He was initially started on Truvada during his last hospitalization for HIV, but this was discontinued due to AKI.  The patient has not yet informed his wife of his HIV status, but states that he is planning a meeting to do so.  ROS: General: no fevers, chills, changes in weight, changes in appetite Skin: no rash HEENT: no blurry vision, hearing changes, sore throat Pulm: no dyspnea, coughing, wheezing CV: no chest pain, palpitations, shortness of breath Abd: no abdominal pain, nausea/vomiting, diarrhea/constipation GU: no dysuria, hematuria, polyuria Ext: no arthralgias, myalgias Neuro: no weakness, numbness, or tingling  Filed Vitals:   04/13/12 1535  BP: 145/95  Pulse: 90  Temp: 97.1 F (36.2 C)    PEX General: alert, cooperative, and in no apparent distress HEENT: pupils equal round and reactive to light, vision grossly intact, oropharynx clear and non-erythematous  Neck: supple, no lymphadenopathy Lungs: clear to ascultation bilaterally, normal work of respiration, no wheezes, rales, ronchi Heart: regular rate and rhythm, no murmurs, gallops, or rubs Abdomen: soft, non-tender, non-distended, normal bowel sounds, ostomy in place with no surrounding erythema Rectal: Large areas of skin breakdown along gluteal cleft with pink  granulation tissue present, without surrounding edema or erythema Extremities: no cyanosis, clubbing, or edema Neurologic: alert & oriented X3, cranial nerves II-XII intact, strength grossly intact, sensation intact to light touch  Current Outpatient Prescriptions on File Prior to Visit  Medication Sig Dispense Refill  . amoxicillin-clavulanate (AUGMENTIN) 875-125 MG per tablet Take 1 tablet by mouth 2 (two) times daily.  60 tablet  0  . oxyCODONE 10 MG TABS Take 1 tablet (10 mg total) by mouth every 6 (six) hours as needed.  30 tablet  0  . sulfamethoxazole-trimethoprim (BACTRIM DS,SEPTRA DS) 800-160 MG per tablet Take one tablet 3 times per week (Monday, Wednesday, Friday).  30 tablet  1    Assessment/Plan

## 2012-04-13 NOTE — Assessment & Plan Note (Signed)
AKI during patient's last hospitalization likely medication-induced (vanc vs truvada).  Patient currently on bactrim. -check BMET today

## 2012-04-13 NOTE — Patient Instructions (Signed)
General Instructions: For your abscesses, we are referring you to Gastroenterology.  Their office will contact you for an appointment.  For your pain, we are prescribing hydrocodone-acetaminophen 10-325 mg, 1 tablet up to every 6 hours as needed for pain.  Please return for a follow-up visit in 3 months   Treatment Goals:  Goals (1 Years of Data) as of 04/13/2012    None      Progress Toward Treatment Goals:  Treatment Goal 04/13/2012  Blood pressure at goal    Self Care Goals & Plans:  Self Care Goal 04/13/2012  Manage my medications take my medicines as prescribed; bring my medications to every visit; refill my medications on time  Eat healthy foods eat foods that are low in salt; eat more vegetables; drink diet soda or water instead of juice or soda       Care Management & Community Referrals:  Referral 04/13/2012  Referrals made for care management support none needed

## 2012-04-13 NOTE — Assessment & Plan Note (Signed)
Pre-transfusion CBC during last hospitalization showed Hb = 7.4, MCV = 78, with ferritin = 1317, iron = 11, saturation ratio = 6.  Concerning for anemia of chronic disease vs iron deficiency anemia. -recheck ferritin today, consider starting iron supplementation if low

## 2012-04-13 NOTE — Assessment & Plan Note (Signed)
Patient has appointment 1/14 with ID, greatly appreciate assistance! -continue bactrim for PCP prophy

## 2012-04-18 ENCOUNTER — Ambulatory Visit: Payer: Self-pay

## 2012-04-18 ENCOUNTER — Telehealth: Payer: Self-pay | Admitting: *Deleted

## 2012-04-18 ENCOUNTER — Encounter: Payer: Self-pay | Admitting: Internal Medicine

## 2012-04-18 ENCOUNTER — Ambulatory Visit (INDEPENDENT_AMBULATORY_CARE_PROVIDER_SITE_OTHER): Payer: Self-pay | Admitting: Internal Medicine

## 2012-04-18 VITALS — BP 186/121 | HR 81 | Temp 98.3°F | Wt 209.0 lb

## 2012-04-18 DIAGNOSIS — Z21 Asymptomatic human immunodeficiency virus [HIV] infection status: Secondary | ICD-10-CM

## 2012-04-18 DIAGNOSIS — B20 Human immunodeficiency virus [HIV] disease: Secondary | ICD-10-CM

## 2012-04-18 MED ORDER — SULFAMETHOXAZOLE-TRIMETHOPRIM 800-160 MG PO TABS: ORAL_TABLET | ORAL | Status: DC

## 2012-04-18 MED ORDER — DOLUTEGRAVIR SODIUM 50 MG PO TABS: 1.0000 | ORAL_TABLET | Freq: Every day | ORAL | Status: DC

## 2012-04-18 MED ORDER — ABACAVIR SULFATE-LAMIVUDINE 600-300 MG PO TABS: 1.0000 | ORAL_TABLET | Freq: Every day | ORAL | Status: DC

## 2012-04-18 NOTE — Progress Notes (Signed)
HIV CLINIC  RFV: establishing care and starting treatment Subjective:    Patient ID: Leon Nichols, male    DOB: 02/19/1966, 47 y.o.   MRN: [id number]  HPI 47yo Male with newly diagnosed HIV/AIDs, CD 4 count of 170 (32%)/VL 61,754, genotype shows M36I. HIV risk factors is sex with multiple male partners, HIV+, prior hx of cocaine use. Had a significant hospitalization from [facility].  Diverting colostomy due to perianal abscess.  Has disclosed to his brother.  Current Outpatient Prescriptions on File Prior to Visit  Medication Sig Dispense Refill  . amoxicillin-clavulanate (AUGMENTIN) 875-125 MG per tablet Take 1 tablet by mouth 2 (two) times daily.  60 tablet  0  . oxyCODONE 10 MG TABS Take 1 tablet (10 mg total) by mouth every 6 (six) hours as needed.  30 tablet  0  . sulfamethoxazole-trimethoprim (BACTRIM DS,SEPTRA DS) 800-160 MG per tablet Take one tablet 3 times per week (Monday, Wednesday, Friday).  30 tablet  1  . HYDROcodone-acetaminophen (NORCO) 10-325 MG per tablet Take 1 tablet by mouth every 6 (six) hours as needed for pain.  60 tablet  1   Active Ambulatory Problems    Diagnosis Date Noted  . HYPERTENSION, BENIGN ESSENTIAL 03/29/2007  . Colitis 08/29/2011  . Nonspecific (abnormal) findings on radiological and other examination of gastrointestinal tract 08/31/2011  . Diverticulosis of colon (without mention of hemorrhage) 08/31/2011  . Herpes zoster complicated 09/24/2011  . Anemia 03/23/2012  . Penile ulcer 03/23/2012  . Perianal abscess and fistula 03/23/2012  . Keratitis 03/23/2012  . HIV (human immunodeficiency virus infection) 03/24/2012  . Acute renal failure 03/29/2012   Resolved Ambulatory Problems    Diagnosis Date Noted  . UTI (urinary tract infection) 09/24/2011  . Perianal abscess 03/23/2012  . Perianal fistula 03/23/2012  . Non-penile genital ulcer 03/23/2012  . Hypotension 03/23/2012  . Septic shock 03/23/2012  . Necrotizing fasciitis 03/23/2012    Past Medical History  Diagnosis Date  . Hypertension   . Diverticulosis   . Hemorrhoids   . Perirectal abscess   . History of shingles       Social hx: living with his dad, weekend only :1 pint of liquor, and 6 pack beers, smoke on weekend but not everyday. Drug of choice cocaine in the past, stopped using he got sick. Weekend user. Not currently using  Family hx : HTN, congestive heart failure at 51 with dad. DM, mom with stroke   Review of Systems  Constitutional: Negative for fever, chills, diaphoresis, activity change, appetite change, fatigue and unexpected weight change.  HENT: Negative for congestion, sore throat, rhinorrhea, sneezing, trouble swallowing and sinus pressure.  Eyes: Negative for photophobia and visual disturbance.  Respiratory: Negative for cough, chest tightness, shortness of breath, wheezing and stridor.  Cardiovascular: Negative for chest pain, palpitations and leg swelling.  Gastrointestinal: Negative for nausea, vomiting, abdominal pain, diarrhea, constipation, blood in stool, abdominal distention and anal bleeding.  Genitourinary: Negative for dysuria, hematuria, flank pain and difficulty urinating.  Musculoskeletal: Negative for myalgias, back pain, joint swelling, arthralgias and gait problem.  Skin: Negative for color change, pallor, rash and wound.  Neurological: Negative for dizziness, tremors, weakness and light-headedness.  Hematological: Negative for adenopathy. Does not bruise/bleed easily.  Psychiatric/Behavioral: Negative for behavioral problems, confusion, sleep disturbance, dysphoric mood, decreased concentration and agitation.       Objective:   Physical Exam BP 186/121  Pulse 81  Temp 98.3 F (36.8 C) (Oral)  Wt 209 lb (94.802 kg)  Physical Exam  Constitutional: He is oriented to person, place, and time. He appears well-developed and well-nourished. No distress.  HENT:  Mouth/Throat: Oropharynx is clear and moist. No  oropharyngeal exudate.  Cardiovascular: Normal rate, regular rhythm and normal heart sounds. Exam reveals no gallop and no friction rub.  No murmur heard.  Pulmonary/Chest: Effort normal and breath sounds normal. No respiratory distress. He has no wheezes.  Abdominal: Soft. Bowel sounds are normal. He exhibits no distension. There is no tenderness.  Lymphadenopathy:  He has no cervical adenopathy.  Neurological: He is alert and oriented to person, place, and time.  Skin: Skin is warm and dry. No rash noted. No erythema.  Psychiatric: He has a normal mood and affect. His behavior is normal.          Assessment & Plan:  HIV= will do dolutegravir 50mg  daily and epzicom. He met with pharmacist to discuss how to take medications, side effects and importance of adherence  OI proph = continue with bactrim TIW for at least 3 months  Perianal abscess= finish course of augmentin  Poorly controlled HTN = will start back his home meds, Lisinopril 40/HCTZ 25 daily. Have him come to clinic next week for BP check, and BMP to see if any AKI.  aki =resolved, thought to be drug induced, hypoperfusion in setting of sepsis.  Health maintenance = at next blood draw, we will check Hep A IgG, and Hep B s Ab to see if he needs vaccination series for Hep B.  HIV prevention = not currently sexually active. Have offered condoms, and stated he does not need them. Discussed that he is at risk of giving it to others if not using protection.  Disclosure= has support from his brother who knows about his HIV status  rtc 4 wks., at that appt will repeat labs to see how he is doing on HIV meds

## 2012-04-24 NOTE — Progress Notes (Signed)
HPI: Leon Nichols is a 47 y.o. male with HIV who is here to establish care post his hospitalization.  Allergies: No Known Allergies  Vitals:    Past Medical History: Past Medical History  Diagnosis Date  . Hypertension   . Diverticulosis   . Hemorrhoids   . Perirectal abscess   . Colitis   . History of shingles   . HIV (human immunodeficiency virus infection) 03/24/2012    Social History: History   Social History  . Marital Status: Married    Spouse Name: N/A    Number of Children: N/A  . Years of Education: N/A   Social History Main Topics  . Smoking status: Former Smoker -- 0.2 packs/day for 15 years    Types: Cigarettes  . Smokeless tobacco: None     Comment: Stopped 1 montha go  . Alcohol Use: Yes  . Drug Use: Yes    Special: Marijuana, Cocaine  . Sexually Active: Yes   Other Topics Concern  . None   Social History Narrative  . None    Home Medications:  (Not in a hospital admission)  Current Regimen: Was on Tivivay + Truvada (during hospitalization)  Labs: HIV 1 RNA Quant (copies/mL)  Date Value  03/24/2012 61754*     CD4 T Cell Abs (cmm)  Date Value  03/24/2012 170*     Hepatitis B Surface Ag (no units)  Date Value  03/23/2012 NEGATIVE      HCV Ab (no units)  Date Value  03/23/2012 NEGATIVE     CrCl: The CrCl is unknown because both a height and weight (above a minimum accepted value) are required for this calculation.  Lipids:    Component Value Date/Time   CHOL 155 03/29/2007 2122   TRIG 103 03/29/2007 2122   HDL 36* 03/29/2007 2122   CHOLHDL 4.3 Ratio 03/29/2007 2122   VLDL 21 03/29/2007 2122   LDLCALC 98 03/29/2007 2122    Assessment: Pt with newly dx HIV. He was recently hospitalized for perianal abscess and had a diverting colostomy. He was started on Tivicay/Truvada inpatient but they were stopped after he developed renal dysfunction. It was unclear whether the renal failure was due to HIV or other abx (vanc) or  other factors. During that time his BP med was also held. At this visit, I spend 15-20 minutes with him going over his options and discuss that we need to restart his antihypertensive meds. His scr is now normal. I discussed with Dr. Drue Second that we should consider using Epzicom instead of Truvada due to his renal issue.   Recommendations: Restart Tivicay + Epzicom Restart Prinzide 40/25 mg qday  Clide Cliff, PharmD Clinical Infectious Disease Pharmacist [facility] 04/24/2012, 10:22 PM

## 2012-04-25 ENCOUNTER — Other Ambulatory Visit (INDEPENDENT_AMBULATORY_CARE_PROVIDER_SITE_OTHER): Payer: Self-pay

## 2012-04-25 ENCOUNTER — Encounter (INDEPENDENT_AMBULATORY_CARE_PROVIDER_SITE_OTHER): Payer: Self-pay | Admitting: Surgery

## 2012-04-25 ENCOUNTER — Ambulatory Visit (INDEPENDENT_AMBULATORY_CARE_PROVIDER_SITE_OTHER): Payer: Self-pay | Admitting: *Deleted

## 2012-04-25 VITALS — BP 154/99 | HR 94

## 2012-04-25 DIAGNOSIS — B20 Human immunodeficiency virus [HIV] disease: Secondary | ICD-10-CM

## 2012-04-25 DIAGNOSIS — Z23 Encounter for immunization: Secondary | ICD-10-CM

## 2012-04-25 LAB — CBC WITH DIFFERENTIAL/PLATELET
Basophils Relative: 0 % (ref 0–1)
Eosinophils Absolute: 0.4 10*3/uL (ref 0.0–0.7)
HCT: 33.1 % — ABNORMAL LOW (ref 39.0–52.0)
Hemoglobin: 10.8 g/dL — ABNORMAL LOW (ref 13.0–17.0)
Lymphs Abs: 1.5 10*3/uL (ref 0.7–4.0)
MCH: 27.8 pg (ref 26.0–34.0)
MCHC: 32.6 g/dL (ref 30.0–36.0)
MCV: 85.3 fL (ref 78.0–100.0)
Monocytes Absolute: 0.4 10*3/uL (ref 0.1–1.0)
Monocytes Relative: 8 % (ref 3–12)
Neutrophils Relative %: 53 % (ref 43–77)
RBC: 3.88 MIL/uL — ABNORMAL LOW (ref 4.22–5.81)

## 2012-04-25 NOTE — Progress Notes (Signed)
Patient did not take his lisinopril this morning. Educated about blood pressure, side effects of HBP, medication adherence.   Andree Coss, RN

## 2012-04-25 NOTE — Telephone Encounter (Signed)
OPEN INCORRECTLY

## 2012-04-26 LAB — COMPLETE METABOLIC PANEL WITH GFR
Albumin: 3.7 g/dL (ref 3.5–5.2)
Alkaline Phosphatase: 80 U/L (ref 39–117)
BUN: 9 mg/dL (ref 6–23)
CO2: 24 mEq/L (ref 19–32)
GFR, Est African American: >89 mL/min
GFR, Est Non African American: >89 mL/min
Glucose, Bld: 66 mg/dL — ABNORMAL LOW (ref 70–99)
Sodium: 141 mEq/L (ref 135–145)
Total Bilirubin: 0.4 mg/dL (ref 0.3–1.2)
Total Protein: 7.9 g/dL (ref 6.0–8.3)

## 2012-04-26 LAB — T-HELPER CELL (CD4) - (RCID CLINIC ONLY): CD4 T Cell Abs: 420 uL (ref 400–2700)

## 2012-05-01 ENCOUNTER — Ambulatory Visit: Payer: Self-pay

## 2012-05-01 NOTE — Progress Notes (Unsigned)
I met with Leon Nichols today for the first time and he reported that he is drinking 2 40 oz beers a day and liquor on the weekends.

## 2012-05-03 ENCOUNTER — Telehealth: Payer: Self-pay | Admitting: *Deleted

## 2012-05-03 ENCOUNTER — Telehealth (INDEPENDENT_AMBULATORY_CARE_PROVIDER_SITE_OTHER): Payer: Self-pay | Admitting: General Surgery

## 2012-05-03 ENCOUNTER — Encounter (INDEPENDENT_AMBULATORY_CARE_PROVIDER_SITE_OTHER): Payer: Self-pay | Admitting: Surgery

## 2012-05-03 DIAGNOSIS — L0291 Cutaneous abscess, unspecified: Secondary | ICD-10-CM

## 2012-05-03 NOTE — Telephone Encounter (Signed)
Pt calls and states he now has MEDICAID and needs a referral to gastro as discussed at prior appt. If you can complete the referral and send to your nurse to do the referral

## 2012-05-03 NOTE — Telephone Encounter (Signed)
Left message on machine for patient to call back and ask for me. To see if she would like to move appt sooner in the day due to cancellations in Dr Streck's schedule. Awaiting call back.  

## 2012-05-05 ENCOUNTER — Ambulatory Visit (INDEPENDENT_AMBULATORY_CARE_PROVIDER_SITE_OTHER): Payer: Medicaid Other | Admitting: Surgery

## 2012-05-05 ENCOUNTER — Encounter (INDEPENDENT_AMBULATORY_CARE_PROVIDER_SITE_OTHER): Payer: Self-pay | Admitting: Surgery

## 2012-05-05 VITALS — BP 152/98 | HR 80 | Temp 97.7°F | Resp 16 | Ht 73.0 in | Wt 200.0 lb

## 2012-05-05 DIAGNOSIS — Z09 Encounter for follow-up examination after completed treatment for conditions other than malignant neoplasm: Secondary | ICD-10-CM

## 2012-05-05 NOTE — Progress Notes (Signed)
NAME: Leon Nichols                                            DOB: Nov 01, 1965 DATE: 05/05/2012                                                  MRN: [id number]  CC:  Chief Complaint  Patient presents with  . Routine Post Op    reck perirectal abscess I&D non 03/23/12    HPI: This patient comes in for post op follow-up .Heunderwent drainage of complex multiple perirectal abscesses followed by a colostomy a few days later. He feels that he is doing well.he is eating okay, not having any significant incisional pain. His colostomy is working well. He is having reduced drainage from all of the wounds sites. PE:  VITAL SIGNS: BP 152/98  Pulse 80  Temp 97.7 F (36.5 C) (Temporal)  Resp 16  Ht 6\' 1"  (1.854 m)  Wt 200 lb (90.719 kg)  BMI 26.39 kg/m2  General: The patient appears to be healthy, NAD Abdomen: Soft and benign colostomy is healthy Perianal area: Still has multiple open areas that have granulation tissue some of which is hypertrophic. There are couple of fistulous tract still present but not draining very much.  DATA REVIEWED: No new data  IMPRESSION: The patient is doing well S/P drainage of complex perirectal fistulas and abscesses and sigmoid colostomy.    PLAN: I treated several of the areas of hypertrophic granulation with silver nitrate today.he'll come back to see me again in a couple of weeks I think this will need to be treated once or twice more. Otherwise he can have normal activities.

## 2012-05-05 NOTE — Patient Instructions (Signed)
Continue taking care of the incisions as you have been doing. Come to see me again in about 2 weeks.

## 2012-05-10 ENCOUNTER — Telehealth (INDEPENDENT_AMBULATORY_CARE_PROVIDER_SITE_OTHER): Payer: Self-pay | Admitting: General Surgery

## 2012-05-10 NOTE — Telephone Encounter (Signed)
We had discussed referral to GI for further evaluation for IBD in the setting of perianal fistulas.  I'll send the referral for [facility] (patient has seen Dr. Marina Goodell in the past).  Thank you.

## 2012-05-10 NOTE — Telephone Encounter (Signed)
Pt called to ask if he should still be having stool from his colostomy bag.  Reviewed chart; Dr. Jamey Ripa has recently done surgery for perirectal abscess, but has not yet reversed his colostomy.  Reassured pt that there should be stool coming into the bag.

## 2012-05-16 ENCOUNTER — Ambulatory Visit: Payer: Medicaid Other

## 2012-05-16 ENCOUNTER — Ambulatory Visit (INDEPENDENT_AMBULATORY_CARE_PROVIDER_SITE_OTHER): Payer: Medicaid Other | Admitting: Internal Medicine

## 2012-05-16 VITALS — BP 178/119 | HR 85 | Temp 97.5°F | Ht 73.0 in | Wt 200.0 lb

## 2012-05-16 DIAGNOSIS — I10 Essential (primary) hypertension: Secondary | ICD-10-CM

## 2012-05-16 DIAGNOSIS — B20 Human immunodeficiency virus [HIV] disease: Secondary | ICD-10-CM

## 2012-05-16 DIAGNOSIS — Z21 Asymptomatic human immunodeficiency virus [HIV] infection status: Secondary | ICD-10-CM

## 2012-05-16 DIAGNOSIS — H6690 Otitis media, unspecified, unspecified ear: Secondary | ICD-10-CM

## 2012-05-16 DIAGNOSIS — K612 Anorectal abscess: Secondary | ICD-10-CM

## 2012-05-16 DIAGNOSIS — K610 Anal abscess: Secondary | ICD-10-CM

## 2012-05-16 MED ORDER — AMOXICILLIN-POT CLAVULANATE 875-125 MG PO TABS: 1.0000 | ORAL_TABLET | Freq: Two times a day (BID) | ORAL | Status: DC

## 2012-05-16 MED ORDER — DOLUTEGRAVIR SODIUM 50 MG PO TABS: 1.0000 | ORAL_TABLET | Freq: Every day | ORAL | Status: DC

## 2012-05-16 MED ORDER — LISINOPRIL 40 MG PO TABS: 40.0000 mg | ORAL_TABLET | Freq: Every day | ORAL | Status: DC

## 2012-05-16 MED ORDER — SULFAMETHOXAZOLE-TRIMETHOPRIM 800-160 MG PO TABS: ORAL_TABLET | ORAL | Status: DC

## 2012-05-16 MED ORDER — HYDROCHLOROTHIAZIDE 25 MG PO TABS: 25.0000 mg | ORAL_TABLET | Freq: Every day | ORAL | Status: DC

## 2012-05-16 MED ORDER — ABACAVIR SULFATE-LAMIVUDINE 600-300 MG PO TABS: 1.0000 | ORAL_TABLET | Freq: Every day | ORAL | Status: DC

## 2012-05-16 NOTE — Progress Notes (Unsigned)
Trei said he is trying to cut down on his drinking, but drank as recently as Sunday.  He talked about his 45 year old father and how irrational he becomes at times.  I provided some education on dementia and suggested that his father sounds confused at times and is having a change of personality, which happens with Alzheimer's Disease and other types of dementia.  I encouraged him to talk with his siblings about this.  Caius said he is sleeping better lately and when he is not dealing with his father, he is doing some better.

## 2012-05-16 NOTE — Progress Notes (Signed)
[facility] NOTE  RFV: routine visit Subjective:    Patient ID: Leon Nichols, male    DOB: 1965/09/26, 47 y.o.   MRN: [id number]  HPI 47yo Male with HIV, CD 4 count 420 (up from 170)/VL 3,675 (down from 61,754) on tivicay/Epzicom not missing a dose. He states that his abdomen is not having much discomfort since his surgery for diverting colostomy. He does Notice that he is having some skin irritation at ostomy bag but doing well overall. His perianal lesions are healing well, per surgery. No foul drainage. He changes his gauze 2-3 per day, keeping wound clean  Ear ache on left ear since having shingles  Now has medicaid - [facility]  Current Outpatient Prescriptions on File Prior to Visit  Medication Sig Dispense Refill  . abacavir-lamiVUDine (EPZICOM) 600-300 MG per tablet Take 1 tablet by mouth daily.  30 tablet  11  . Dolutegravir Sodium (TIVICAY) 50 MG TABS Take 1 tablet (50 mg total) by mouth daily.  30 tablet  11  . HYDROcodone-acetaminophen (NORCO) 10-325 MG per tablet Take 1 tablet by mouth every 6 (six) hours as needed for pain.  60 tablet  1  . sulfamethoxazole-trimethoprim (BACTRIM DS,SEPTRA DS) 800-160 MG per tablet Take one tablet 3 times per week (Monday, Wednesday, Friday).  30 tablet  2  . amoxicillin-clavulanate (AUGMENTIN) 875-125 MG per tablet Take 1 tablet by mouth 2 (two) times daily.  60 tablet  0   No current facility-administered medications on file prior to visit.     Review of Systems No fever, chills, nightsweats, cough, runny nose, headache, constipation.     Objective:   Physical Exam BP 178/119  Pulse 85  Temp(Src) 97.5 F (36.4 C) (Oral)  Ht 6\' 1"  (1.854 m)  Wt 200 lb (90.719 kg)  BMI 26.39 kg/m2 Physical Exam  Constitutional: He is oriented to person, place, and time. He appears well-developed and well-nourished. No distress.  HENT: Left ear cloudy TM Mouth/Throat: Oropharynx is clear and moist. No oropharyngeal exudate.   Cardiovascular: Normal rate, regular rhythm and normal heart sounds. Exam reveals no gallop and no friction rub.  No murmur heard.  Pulmonary/Chest: Effort normal and breath sounds normal. No respiratory distress. He has no wheezes.  Abdominal: Soft. Bowel sounds are normal. He exhibits no distension. There is no tenderness. LLQ ostomy intact no exudate. Lymphadenopathy:  no cervical adenopathy.  Buttock: upper lesion, clean base, 3-4 small cm shallow ulcers also good granulation tissue  Skin: Skin is warm and dry. No rash noted. No erythema.  Psychiatric: He has a normal mood and affect. His behavior is normal.          Assessment & Plan:  HIV= continue with great job on current regimen of tivicay and epzicom  Acute OM = amox/clav 875mg  BID X 10 day  HTN = lisinopril/hctz need to increase to 40/25.   Perianal abscess= continue with local wound care  oi proph= continue with bactrim m-w-f, can stop in 3 months  Health maintenance = Hep A IgG and Hep B s Ab  Return for last in 8 wks, and repeat visit in 10 wks

## 2012-05-17 ENCOUNTER — Encounter: Payer: Self-pay | Admitting: Internal Medicine

## 2012-05-26 ENCOUNTER — Encounter (INDEPENDENT_AMBULATORY_CARE_PROVIDER_SITE_OTHER): Payer: Medicaid Other | Admitting: Surgery

## 2012-05-30 ENCOUNTER — Encounter (INDEPENDENT_AMBULATORY_CARE_PROVIDER_SITE_OTHER): Payer: Self-pay | Admitting: Surgery

## 2012-06-09 ENCOUNTER — Ambulatory Visit: Payer: Medicaid Other | Admitting: Internal Medicine

## 2012-06-12 ENCOUNTER — Encounter: Payer: Self-pay | Admitting: Internal Medicine

## 2012-06-20 ENCOUNTER — Encounter (INDEPENDENT_AMBULATORY_CARE_PROVIDER_SITE_OTHER): Payer: Self-pay | Admitting: Surgery

## 2012-06-20 ENCOUNTER — Ambulatory Visit (INDEPENDENT_AMBULATORY_CARE_PROVIDER_SITE_OTHER): Payer: Medicaid Other | Admitting: Surgery

## 2012-06-20 VITALS — BP 140/100 | HR 64 | Temp 97.9°F | Resp 16 | Ht 73.0 in | Wt 214.0 lb

## 2012-06-20 DIAGNOSIS — Z09 Encounter for follow-up examination after completed treatment for conditions other than malignant neoplasm: Secondary | ICD-10-CM

## 2012-06-20 NOTE — Progress Notes (Signed)
NAME: Leon Nichols                                            DOB: October 22, 1965 DATE: 06/20/2012                                                  MRN: [id number]  CC:  Chief Complaint  Patient presents with  . Routine Post Op    peri-rectal abscess/colostomy 03/23/2013    HPI: This patient comes in for post op follow-up .Heunderwent drainage of complex multiple perirectal abscesses followed by a colostomy a few days later. He feels that he is doing well.he is eating okay, not having any significant incisional pain. His colostomy is working well. He thinks the wounds are almost healed  PE:  VITAL SIGNS: BP 140/100  Pulse 64  Temp(Src) 97.9 F (36.6 C) (Temporal)  Resp 16  Ht 6\' 1"  (1.854 m)  Wt 214 lb (97.07 kg)  BMI 28.24 kg/m2  General: The patient appears to be healthy, NAD Abdomen: Soft and benign colostomy is healthy Perianal area:Several areas with granulation tissue, no obvious fistula today, but suspect there is a residual DATA REVIEWED: No new data  IMPRESSION: The patient is doing well S/P drainage of complex perirectal fistulas and abscesses and sigmoid colostomy.    PLAN: Treated the granulations with AgNO3. He will come back in a couple of weeks to see Dr Maisie Fus and se if any additional surgery needs to be done for the fistula and if the colostomy might be able to be reversed at some point in time

## 2012-06-23 ENCOUNTER — Other Ambulatory Visit: Payer: Self-pay | Admitting: *Deleted

## 2012-06-30 ENCOUNTER — Telehealth (INDEPENDENT_AMBULATORY_CARE_PROVIDER_SITE_OTHER): Payer: Self-pay | Admitting: General Surgery

## 2012-06-30 NOTE — Telephone Encounter (Signed)
Pt called to ask if OK to give medical supply company Dr. Tenna Child name, to send orders for signature.  Assured pt this is okay and not unexpected.  Pt states he is nearly out of bags.

## 2012-07-05 ENCOUNTER — Ambulatory Visit: Payer: Medicaid Other | Admitting: Internal Medicine

## 2012-07-05 ENCOUNTER — Encounter (INDEPENDENT_AMBULATORY_CARE_PROVIDER_SITE_OTHER): Payer: Medicaid Other | Admitting: General Surgery

## 2012-07-10 ENCOUNTER — Ambulatory Visit: Payer: Medicaid Other | Admitting: Internal Medicine

## 2012-07-10 NOTE — Addendum Note (Signed)
Addended by: Neomia Dear on: 07/10/2012 08:11 PM   Modules accepted: Orders

## 2012-07-11 ENCOUNTER — Encounter (INDEPENDENT_AMBULATORY_CARE_PROVIDER_SITE_OTHER): Payer: Self-pay | Admitting: General Surgery

## 2012-07-14 ENCOUNTER — Encounter: Payer: Self-pay | Admitting: Internal Medicine

## 2012-07-14 ENCOUNTER — Ambulatory Visit: Payer: Medicaid Other | Admitting: Internal Medicine

## 2012-07-14 ENCOUNTER — Ambulatory Visit (INDEPENDENT_AMBULATORY_CARE_PROVIDER_SITE_OTHER): Payer: Medicaid Other | Admitting: Internal Medicine

## 2012-07-14 VITALS — BP 131/90 | HR 79 | Temp 97.7°F | Ht 71.0 in | Wt 212.6 lb

## 2012-07-14 DIAGNOSIS — H9209 Otalgia, unspecified ear: Secondary | ICD-10-CM

## 2012-07-14 DIAGNOSIS — K612 Anorectal abscess: Secondary | ICD-10-CM

## 2012-07-14 DIAGNOSIS — L0291 Cutaneous abscess, unspecified: Secondary | ICD-10-CM

## 2012-07-14 DIAGNOSIS — I10 Essential (primary) hypertension: Secondary | ICD-10-CM

## 2012-07-14 DIAGNOSIS — H9202 Otalgia, left ear: Secondary | ICD-10-CM | POA: Insufficient documentation

## 2012-07-14 MED ORDER — HYDROCODONE-ACETAMINOPHEN 10-325 MG PO TABS: 1.0000 | ORAL_TABLET | ORAL | Status: DC | PRN

## 2012-07-14 NOTE — Progress Notes (Signed)
Patient: Leon Nichols   MRN: [id number]  DOB: [birth year]  PCP: Provider Not In System   Subjective:    HPI: Leon Nichols is a 47 y.o. male with a PMHx as outlined below, who presented to clinic today for the following:  1) HTN - Patient does not check blood pressure regularly at home. Currently taking Lisinopril 40mg  daily and HCTZ 25mg  daily, which were escalated from Lisinopril-HCTZ 20-12.5mg  daily during ID appointment 05/18/2012. Patient misses doses 0 x per week on average. denies headaches, dizziness, lightheadedness, chest pain, shortness of breath.  does not request refills today.   2) Complex mulitple perirectal abscesses - The patient was hospitalized 12/18-12/30 with multiple complex perirectal abscesses s/p drainage, and perineal fistulas requiring diverting colostomy, and found to have HIV. He has since been following with ID clinic and [facility] for the respective issues. Seen by Dr.Streck on 03/18/2014at which time AgNO3 was recommended for continued care of the granulations. He was recommended continued followup for consideration of reversal of his colostomy.  Today, pt indicates pain around site of colostomy bag with irritation of skin, some redness. However, the skinsurrounding his colostomy site does seem to be dry and intact without maceration. The patient has also experienced significant pain surrounding his former perirectal abscess site. Continues to have some drainage in the gluteal fold, however states that this is unchanged from his baseline. No fevers, chills, nausea, vomiting, diarrhea.  3) Left ear OM - pt describes a 2 month history of Left ear pain with fullness, discharge. Denies associated no fever, hearing loss, itching. He was seen in [facility] on 05/16/2012 at which time he was started on Augmentin x 10 days. Patient indicates that he took the medication as prescribed, despite which he continues to have the persistent symptoms. No fevers, chills, nasal congestion,  hearing loss.   Review of Systems: Per HPI.    Current Outpatient Medications: Medication Sig  . abacavir-lamiVUDine (EPZICOM) 600-300 MG per tablet Take 1 tablet by mouth daily.  Roselie Awkward Sodium (TIVICAY) 50 MG TABS Take 1 tablet (50 mg total) by mouth daily.  . hydrochlorothiazide (HYDRODIURIL) 25 MG tablet Take 1 tablet (25 mg total) by mouth daily.  Marland Kitchen lisinopril (PRINIVIL,ZESTRIL) 40 MG tablet Take 1 tablet (40 mg total) by mouth daily.  Marland Kitchen sulfamethoxazole-trimethoprim (BACTRIM DS,SEPTRA DS) 800-160 MG per tablet Take one tablet 3 times per week (Monday, Wednesday, Friday).    Allergies: No Known Allergies   Past Medical History  Diagnosis Date  . Hypertension   . Diverticulosis   . Hemorrhoids   . Perirectal abscess   . Colitis   . History of shingles   . HIV (human immunodeficiency virus infection) 03/24/2012    Objective:    Physical Exam: Filed Vitals:   07/14/12 1359  BP: 131/90  Pulse: 79  Temp: 97.7 F (36.5 C)     General: Vital signs reviewed and noted. Well-developed, well-nourished, in no acute distress; alert, appropriate and cooperative throughout examination.  Head: Normocephalic, atraumatic.  Eyes: conjunctivae/corneas clear. PERRL, EOM's intact. Fundi benign.  Ears: Right ear - TM nonerythematous, not bulging, good light reflex bilaterally. Left ear - TM nonerythematous, not bulging, good light reflex bilaterally. With small amount of creamy discharge in bottom of canal.  Nose: Mucous membranes moist, not inflammed, nonerythematous.  Throat: Oropharynx nonerythematous, no exudate appreciated.   Neck: No deformities, masses, or tenderness noted.  Lungs:  Normal respiratory effort. Clear to auscultation BL without crackles or wheezes.  Heart:  RRR. S1 and S2 normal without gallop, rubs. no murmur.  Abdomen:  BS normoactive. Soft, Nondistended, non-tender.  No masses or organomegaly.  Extremities: No pretibial edema.  GU: Large areas of skin  breakdown along gluteal cleft with pink granulation tissue. No surrounding edema, erythema.  Exam chaperoned by: Filomena Jungling    Assessment/ Plan:   The patient's case and plan of care was discussed with attending physician, Dr. Blanch Media.

## 2012-07-14 NOTE — Assessment & Plan Note (Addendum)
Assessment: Patient has had incision and drainage of these abscesses these during his last hospital course in December 2013. He is following all wound care instructions as provided by his surgeon. He was last seen on 3/18 and recommended followup in an additional 2 weeks, however he is yet to do so. He denies new fevers, chills, drainage. However, he does have persistent pain from this disease process.  Plan:      Refill Vicodin RX - as this is not lasting for the entire 6 hours, I changed to q4h PRN dosing and refilled #120.   He was instructed - You have been started on a new medication that can cause drowsiness, do not drive or operate heavy machinery . Do not take this medication with alcohol.    I do not anticipate that this should have to be a chronic medication, however, will be necessary during this acute phase.  Encouraged to see his surgeons ASAP

## 2012-07-14 NOTE — Assessment & Plan Note (Signed)
Pertinent Data: BP Readings from Last 3 Encounters:  07/14/12 131/90  06/20/12 140/100  05/16/12 178/119    Basic Metabolic Panel:    Component Value Date/Time   NA 141 04/25/2012 1138   K 3.7 04/25/2012 1138   CL 102 04/25/2012 1138   CO2 24 04/25/2012 1138   BUN 9 04/25/2012 1138   CREATININE 0.79 04/25/2012 1138   CREATININE 1.98* 04/03/2012 0535   GLUCOSE 66* 04/25/2012 1138   CALCIUM 9.4 04/25/2012 1138    Assessment: Disease Control:  Controlled  Progress toward goals:  At goal  Barriers to meeting goals: no barriers identified    Patient is compliant most of the time with prescribed medications.   Plan:  continue current medications  Educational resources provided: brochure  Self management tools provided: home blood pressure logbook

## 2012-07-14 NOTE — Assessment & Plan Note (Signed)
Assessment: Patient has persistent left ear pain since his recent shingles. Possibly complicated with neuropathic pain, however he does have a small amount of discharge as well.  Plan:      The patient will be referred to ENT.  Will not start additional antibiotics today, as he has just been treated with him within the past month and half, without improvement.

## 2012-07-14 NOTE — Patient Instructions (Signed)
General Instructions:  Please follow-up at the clinic in 1 month, at which time we will reevaluate your pain and blood pressure - OR, please follow-up in the clinic sooner if needed.  There have been changes in your medications:  I have refilled your hydrocodone-acetaminophen  #120. You have been started on a new medication that can cause drowsiness, do not drive or operate heavy machinery . Do not take this medication with alcohol.      Please make sure to be evaluated by your surgeon as soon as possible.  If you have been started on new medication(s), and you develop symptoms concerning for allergic reaction, including, but not limited to, throat closing, tongue swelling, rash, please stop the medication immediately and call the clinic at [phone], and go to the ER.  If symptoms worsen, or new symptoms arise, please call the clinic or go to the ER.  PLEASE BRING ALL OF YOUR MEDICATIONS  IN A BAG TO YOUR NEXT APPOINTMENT   Treatment Goals:  Goals (1 Years of Data) as of 07/14/12   None      Progress Toward Treatment Goals:  Treatment Goal 04/13/2012  Blood pressure at goal    Self Care Goals & Plans:  Self Care Goal 07/14/2012  Manage my medications take my medicines as prescribed; bring my medications to every visit; refill my medications on time  Eat healthy foods drink diet soda or water instead of juice or soda; eat more vegetables; eat foods that are low in salt    Care Management & Community Referrals:  Referral 04/13/2012  Referrals made for care management support none needed

## 2012-07-17 NOTE — Progress Notes (Signed)
Case discussed with Dr. Kalia-Reynolds at time of visit. We reviewed the resident's history and exam and pertinent patient test results. I agree with the assessment, diagnosis, and plan of care documented in the resident's note.  

## 2012-07-18 ENCOUNTER — Other Ambulatory Visit: Payer: Medicaid Other

## 2012-07-24 ENCOUNTER — Other Ambulatory Visit: Payer: Medicaid Other

## 2012-07-27 ENCOUNTER — Other Ambulatory Visit: Payer: Self-pay | Admitting: Infectious Diseases

## 2012-07-27 ENCOUNTER — Other Ambulatory Visit: Payer: Medicaid Other

## 2012-07-27 DIAGNOSIS — B20 Human immunodeficiency virus [HIV] disease: Secondary | ICD-10-CM

## 2012-07-27 LAB — COMPREHENSIVE METABOLIC PANEL
AST: 14 U/L (ref 0–37)
Alkaline Phosphatase: 64 U/L (ref 39–117)
BUN: 15 mg/dL (ref 6–23)
Calcium: 9.7 mg/dL (ref 8.4–10.5)
Creat: 0.94 mg/dL (ref 0.50–1.35)
Total Bilirubin: 0.2 mg/dL — ABNORMAL LOW (ref 0.3–1.2)

## 2012-07-27 LAB — CBC WITH DIFFERENTIAL/PLATELET
Basophils Absolute: 0 10*3/uL (ref 0.0–0.1)
Basophils Relative: 0 % (ref 0–1)
Eosinophils Absolute: 0.1 10*3/uL (ref 0.0–0.7)
Eosinophils Relative: 3 % (ref 0–5)
HCT: 38.1 % — ABNORMAL LOW (ref 39.0–52.0)
MCHC: 34.6 g/dL (ref 30.0–36.0)
MCV: 92.5 fL (ref 78.0–100.0)
Monocytes Absolute: 0.3 10*3/uL (ref 0.1–1.0)
Platelets: 260 10*3/uL (ref 150–400)
RDW: 15.5 % (ref 11.5–15.5)

## 2012-07-28 LAB — T-HELPER CELL (CD4) - (RCID CLINIC ONLY)
CD4 % Helper T Cell: 26 % — ABNORMAL LOW (ref 33–55)
CD4 T Cell Abs: 590 uL (ref 400–2700)

## 2012-07-28 LAB — HEPATITIS A ANTIBODY, TOTAL: Hep A Total Ab: NEGATIVE

## 2012-08-01 ENCOUNTER — Telehealth: Payer: Self-pay | Admitting: *Deleted

## 2012-08-01 ENCOUNTER — Ambulatory Visit: Payer: Medicaid Other | Admitting: Internal Medicine

## 2012-08-01 NOTE — Telephone Encounter (Signed)
Called the patient to find out why he no showed the appt and he advised he had other appt at the same time and thought this one was later at 230 pm. Advised him that Dr Drue Second will not be here this afternoon and offered to reschedule him for next week. Gave him 08/10/12 at 415 pm as he has an appt with [facility] the same day at 255 pm. Advised him to go to that appt a little earlier. Also advised him if something comes up to give [place] a call and to please not No Show if possible.

## 2012-08-10 ENCOUNTER — Ambulatory Visit: Payer: Medicaid Other | Admitting: Internal Medicine

## 2012-08-10 ENCOUNTER — Encounter (INDEPENDENT_AMBULATORY_CARE_PROVIDER_SITE_OTHER): Payer: Medicaid Other | Admitting: General Surgery

## 2012-08-18 ENCOUNTER — Encounter (INDEPENDENT_AMBULATORY_CARE_PROVIDER_SITE_OTHER): Payer: Self-pay | Admitting: General Surgery

## 2013-01-25 ENCOUNTER — Encounter: Payer: Self-pay | Admitting: Licensed Clinical Social Worker

## 2013-01-29 ENCOUNTER — Other Ambulatory Visit: Payer: Self-pay | Admitting: Licensed Clinical Social Worker

## 2013-01-29 DIAGNOSIS — B20 Human immunodeficiency virus [HIV] disease: Secondary | ICD-10-CM

## 2013-08-17 ENCOUNTER — Ambulatory Visit: Payer: Medicaid Other | Admitting: Internal Medicine

## 2013-08-24 ENCOUNTER — Encounter: Payer: Self-pay | Admitting: Internal Medicine

## 2013-08-24 ENCOUNTER — Ambulatory Visit: Payer: Medicaid Other | Admitting: Internal Medicine

## 2013-09-24 ENCOUNTER — Ambulatory Visit: Payer: Medicaid Other | Admitting: Internal Medicine

## 2013-10-20 ENCOUNTER — Encounter (HOSPITAL_COMMUNITY): Payer: Self-pay | Admitting: Emergency Medicine

## 2013-10-20 ENCOUNTER — Emergency Department (HOSPITAL_COMMUNITY)
Admission: EM | Admit: 2013-10-20 | Discharge: 2013-10-20 | Disposition: A | Discharge status: Home / Self Care | Payer: Medicaid Other | Attending: Emergency Medicine | Admitting: Emergency Medicine

## 2013-10-20 DIAGNOSIS — Z760 Encounter for issue of repeat prescription: Secondary | ICD-10-CM | POA: Insufficient documentation

## 2013-10-20 DIAGNOSIS — K6289 Other specified diseases of anus and rectum: Secondary | ICD-10-CM | POA: Diagnosis not present

## 2013-10-20 DIAGNOSIS — Z792 Long term (current) use of antibiotics: Secondary | ICD-10-CM | POA: Diagnosis not present

## 2013-10-20 DIAGNOSIS — F17200 Nicotine dependence, unspecified, uncomplicated: Secondary | ICD-10-CM | POA: Diagnosis not present

## 2013-10-20 DIAGNOSIS — H9209 Otalgia, unspecified ear: Secondary | ICD-10-CM | POA: Insufficient documentation

## 2013-10-20 DIAGNOSIS — H9202 Otalgia, left ear: Secondary | ICD-10-CM

## 2013-10-20 DIAGNOSIS — I10 Essential (primary) hypertension: Secondary | ICD-10-CM | POA: Insufficient documentation

## 2013-10-20 DIAGNOSIS — Z21 Asymptomatic human immunodeficiency virus [HIV] infection status: Secondary | ICD-10-CM | POA: Diagnosis not present

## 2013-10-20 DIAGNOSIS — K5730 Diverticulosis of large intestine without perforation or abscess without bleeding: Secondary | ICD-10-CM | POA: Diagnosis not present

## 2013-10-20 DIAGNOSIS — Z79899 Other long term (current) drug therapy: Secondary | ICD-10-CM | POA: Insufficient documentation

## 2013-10-20 DIAGNOSIS — Z8619 Personal history of other infectious and parasitic diseases: Secondary | ICD-10-CM | POA: Diagnosis not present

## 2013-10-20 MED ORDER — HYDROCHLOROTHIAZIDE 25 MG PO TABS: 25.0000 mg | ORAL_TABLET | Freq: Every day | ORAL | Status: DC

## 2013-10-20 MED ORDER — OFLOXACIN 0.3 % OT SOLN
5.0000 [drp] | Freq: Once | OTIC | Status: AC
  Administered 2013-10-20: 5 [drp] via OTIC
  Filled 2013-10-20: qty 5

## 2013-10-20 MED ORDER — LISINOPRIL 40 MG PO TABS: 40.0000 mg | ORAL_TABLET | Freq: Every day | ORAL | Status: DC

## 2013-10-20 MED ORDER — HYDROCODONE-ACETAMINOPHEN 5-325 MG PO TABS: ORAL_TABLET | ORAL | Status: DC

## 2013-10-20 NOTE — ED Provider Notes (Signed)
CSN: [id number]     Arrival date & time 10/20/13  1028 History  This chart was scribed for non-physician practitioner, [name], PA-C working with [name], MD, by Jarvis Morgan, ED Scribe. This patient was seen in room TR05C/TR05C and the patient's care was started at 10:59 AM   Chief Complaint  Patient presents with  . Otalgia   The history is provided by the patient. No language interpreter was used.   HPI Comments: Leon Nichols is a 48 y.o. male with a h/o HTN, DVT, shingles, perirectal abscesses and HIV who presents to the Emergency Department complaining of gradually worsening, waxing and waning, moderate, otalgia to his left ear. He believes this could be due to him having shingles in that area in the past. Pt also admits that he sometimes irritates his ear by using q-tips when his ears are bothering him. He is having associated decreased hearing .Patient states he has a colostomy bag. He sees Dr. [name] for his HIV. States he has not been taking his HIV medications since January due to insurance issues. He states that he needs to have some blood work done for his HIV medication but is unsure of exactly what he needs. He states he has seen an ear specialist in the past for his ear problems and was given liquid Penicillin. He denies any fevers, congestion, sinus pressure, cough, or rhinorrhea.   Patient has a second complaint of rectal pain. He describes the pain as a discomfort. He states he has had abscesses at the site before. Pt has personal history of perianal fistulas that lead to him having the colostomy bag.    Past Medical History  Diagnosis Date  . Hypertension   . Diverticulosis   . Hemorrhoids   . Perirectal abscess   . Colitis   . History of shingles   . HIV (human immunodeficiency virus infection) 03/24/2012   Past Surgical History  Procedure Laterality Date  . Colonoscopy  08/31/2011    Procedure: COLONOSCOPY;  Surgeon: Hilarie Fredrickson, MD;   Location: [facility] ENDOSCOPY;  Service: Endoscopy;  Laterality: N/A;  . Incision and drainage perirectal abscess  03/23/2012    Procedure: IRRIGATION AND DEBRIDEMENT PERIRECTAL ABSCESS;  Surgeon: Mariella Saa, MD;  Location: [facility] OR;  Service: General;  Laterality: N/A;  I & D soft tissue scrotal and perineal abscess  . Dressing change under anesthesia  03/27/2012    Procedure: DRESSING CHANGE UNDER ANESTHESIA;  Surgeon: Currie Paris, MD;  Location: [facility] OR;  Service: General;  Laterality: N/A;  . Laparoscopic diverted colostomy  03/27/2012    Procedure: LAPAROSCOPIC DIVERTED COLOSTOMY;  Surgeon: Currie Paris, MD;  Location: [facility] OR;  Service: General;  Laterality: N/A;   Family History  Problem Relation Age of Onset  . Hypertension Mother   . Diabetes Mother    History  Substance Use Topics  . Smoking status: Current Some Day Smoker -- 0.25 packs/day for 15 years    Types: Cigarettes  . Smokeless tobacco: Not on file     Comment: Stopped 1 montha go  . Alcohol Use: Yes    Review of Systems  Constitutional: Negative for fever.  HENT: Positive for ear pain (left sided) and hearing loss (mild). Negative for congestion, rhinorrhea and sinus pressure.   Respiratory: Negative for cough.   Gastrointestinal: Positive for rectal pain.    Allergies  Review of patient's allergies indicates no known allergies.  Home Medications   Prior to Admission  medications   Medication Sig Start Date End Date Taking? Authorizing Provider  abacavir-lamiVUDine (EPZICOM) 600-300 MG per tablet Take 1 tablet by mouth daily. 05/16/12   [name], MD  Dolutegravir Sodium (TIVICAY) 50 MG TABS Take 1 tablet (50 mg total) by mouth daily. 05/16/12   [name], MD  hydrochlorothiazide (HYDRODIURIL) 25 MG tablet Take 1 tablet (25 mg total) by mouth daily. 10/20/13   Renne Crigler, PA-C  HYDROcodone-acetaminophen (NORCO/VICODIN) 5-325 MG per tablet Take 1-2 tablets every 6 hours as needed for severe  pain 10/20/13   Renne Crigler, PA-C  lisinopril (PRINIVIL,ZESTRIL) 40 MG tablet Take 1 tablet (40 mg total) by mouth daily. 10/20/13   Renne Crigler, PA-C  sulfamethoxazole-trimethoprim (BACTRIM DS,SEPTRA DS) 800-160 MG per tablet Take one tablet 3 times per week (Monday, Wednesday, Friday). 05/16/12   [name], MD   BP 160/115  Pulse 101  Temp(Src) 97.9 F (36.6 C) (Oral)  Resp 18  Ht 6\' 1"  (1.854 m)  Wt 250 lb (113.399 kg)  BMI 32.99 kg/m2  SpO2 97%  Physical Exam  Nursing note and vitals reviewed. Constitutional: He appears well-developed and well-nourished. No distress.  HENT:  Head: Normocephalic and atraumatic.  Right Ear: Tympanic membrane is not perforated. No decreased hearing is noted.  Left Ear: Tympanic membrane is perforated. Decreased hearing (patient cannot hear whisper) is noted.  Mouth/Throat: Oropharynx is clear and moist.  Eyes: Conjunctivae and EOM are normal. Right eye exhibits no discharge. Left eye exhibits no discharge.  Neck: Normal range of motion. Neck supple. No tracheal deviation present.  Cardiovascular: Normal rate, regular rhythm and normal heart sounds.   Pulmonary/Chest: Effort normal and breath sounds normal. No respiratory distress.  Abdominal: Soft. There is no tenderness.  Genitourinary:  Patient refuses rectal examination.   Musculoskeletal: Normal range of motion.  Neurological: He is alert.  Skin: Skin is warm and dry.  Psychiatric: He has a normal mood and affect. His behavior is normal.    ED Course  Procedures (including critical care time)  DIAGNOSTIC STUDIES: Oxygen Saturation is 97% on RA, normal by my interpretation.    COORDINATION OF CARE: 11:07 AM- Will order Floxin. Pt advised of plan for treatment and pt agrees.    Labs Review Labs Reviewed - No data to display  Imaging Review No results found.   EKG Interpretation None      Patient seen and examined. Rx for ofloxacin drops, ENT f/u. Rx pain medication,  BP medication refill. Patient counseled on use of narcotic pain medications. Counseled not to combine these medications with others containing tylenol. Urged not to drink alcohol, drive, or perform any other activities that requires focus while taking these medications. The patient verbalizes understanding and agrees with the plan.    Vital signs reviewed and are as follows: Filed Vitals:   10/20/13 1031  BP: 160/115  Pulse: 101  Temp: 97.9 F (36.6 C)  Resp: 18      MDM   Final diagnoses:  Left ear pain  Medication refill   Patient with multiple complaints:  Lab work for HIV appointment: It is unclear what lab work the patient would need and what he is requesting. He simply states he needs lab work so that he can see his infectious disease physician. Regardless, this is not the appropriate setting to perform routine lab work. Patient encouraged to followup with his infectious disease office for further instructions. Patient needs to restart his antiretroviral therapy.  Ear pain: This is chronic. Question  left TM perforation. Ofloxacin ear drops given. ENT followup encouraged and referral given. Pain medicine given.  Rectal pain: At baseline per patient. He refuses genital exam. Pain medication given. No concern for obstruction or significant infection given prescription and lack of fever.  Blood pressure medication refill: Patient with hypertension today. No obvious end organ damage. Medications refilled. Encouraged PCP followup for recheck.  I personally performed the services described in this documentation, which was scribed in my presence. The recorded information has been reviewed and is accurate.     Renne CriglerJoshua Tuwanna Krausz, PA-C 10/20/13 1153

## 2013-10-20 NOTE — ED Provider Notes (Signed)
Medical screening examination/treatment/procedure(s) were performed by non-physician practitioner and as supervising physician I was immediately available for consultation/collaboration.  Shanna CiscoMegan E Docherty, MD 10/20/13 2053

## 2013-10-20 NOTE — ED Notes (Signed)
Pt. Has reported that he has decreased hearing to his lt. Ear due to having shingles months ago.  Pt. States, ""my ear has not been right since then."  He also reports that he has been off of his medications since January and needs to start taking them.  He also is having lt. Buttocks pain due to repaired abscesses.

## 2013-10-20 NOTE — Discharge Instructions (Signed)
Please read and follow all provided instructions.  Your diagnoses today include:  1. Left ear pain   2. Medication refill   3. Essential hypertension, benign     Tests performed today include:  Vital signs. See below for your results today.   Medications prescribed:   Ofloxacin ear drops - 10 drops in left ear twice a day for 14 days   Vicodin (hydrocodone/acetaminophen) - narcotic pain medication  DO NOT drive or perform any activities that require you to be awake and alert because this medicine can make you drowsy. BE VERY CAREFUL not to take multiple medicines containing Tylenol (also called acetaminophen). Doing so can lead to an overdose which can damage your liver and cause liver failure and possibly death.  Take any prescribed medications only as directed.  Home care instructions:  Follow any educational materials contained in this packet.  BE VERY CAREFUL not to take multiple medicines containing Tylenol (also called acetaminophen). Doing so can lead to an overdose which can damage your liver and cause liver failure and possibly death.   Follow-up instructions: Please follow-up with your primary care provider in the next 3 days for further evaluation of your symptoms.   Return instructions:   Please return to the Emergency Department if you experience worsening symptoms.   Please return if you have any other emergent concerns.  Additional Information:  Your vital signs today were: BP 160/115   Pulse 101   Temp(Src) 97.9 F (36.6 C) (Oral)   Resp 18   Ht 6\' 1"  (1.854 m)   Wt 250 lb (113.399 kg)   BMI 32.99 kg/m2   SpO2 97% If your blood pressure (BP) was elevated above 135/85 this visit, please have this repeated by your doctor within one month. --------------

## 2013-11-08 ENCOUNTER — Other Ambulatory Visit (INDEPENDENT_AMBULATORY_CARE_PROVIDER_SITE_OTHER): Payer: Medicaid Other

## 2013-11-08 ENCOUNTER — Other Ambulatory Visit (HOSPITAL_COMMUNITY)
Admission: RE | Admit: 2013-11-08 | Discharge: 2013-11-08 | Disposition: A | Discharge status: Home / Self Care | Payer: Medicaid Other | Source: Ambulatory Visit | Attending: Internal Medicine | Admitting: Internal Medicine

## 2013-11-08 DIAGNOSIS — B20 Human immunodeficiency virus [HIV] disease: Secondary | ICD-10-CM

## 2013-11-08 DIAGNOSIS — Z113 Encounter for screening for infections with a predominantly sexual mode of transmission: Secondary | ICD-10-CM | POA: Diagnosis present

## 2013-11-08 DIAGNOSIS — Z79899 Other long term (current) drug therapy: Secondary | ICD-10-CM

## 2013-11-08 LAB — LIPID PANEL
Cholesterol: 142 mg/dL (ref 0–200)
HDL: 45 mg/dL (ref >39)
LDL Cholesterol: 76 mg/dL (ref 0–99)
Total CHOL/HDL Ratio: 3.2 Ratio
Triglycerides: 105 mg/dL (ref <150)
VLDL: 21 mg/dL (ref 0–40)

## 2013-11-08 LAB — CBC WITH DIFFERENTIAL/PLATELET
BASOS PCT: 0 % (ref 0–1)
Basophils Absolute: 0 10*3/uL (ref 0.0–0.1)
EOS ABS: 0 10*3/uL (ref 0.0–0.7)
EOS PCT: 1 % (ref 0–5)
HCT: 41.4 % (ref 39.0–52.0)
Hemoglobin: 14 g/dL (ref 13.0–17.0)
LYMPHS ABS: 1.8 10*3/uL (ref 0.7–4.0)
Lymphocytes Relative: 44 % (ref 12–46)
MCH: 27.1 pg (ref 26.0–34.0)
MCHC: 33.8 g/dL (ref 30.0–36.0)
MCV: 80.1 fL (ref 78.0–100.0)
Monocytes Absolute: 0.6 10*3/uL (ref 0.1–1.0)
Monocytes Relative: 14 % — ABNORMAL HIGH (ref 3–12)
NEUTROS PCT: 41 % — AB (ref 43–77)
Neutro Abs: 1.7 10*3/uL (ref 1.7–7.7)
PLATELETS: 166 10*3/uL (ref 150–400)
RBC: 5.17 MIL/uL (ref 4.22–5.81)
RDW: 18.4 % — ABNORMAL HIGH (ref 11.5–15.5)
WBC: 4.2 10*3/uL (ref 4.0–10.5)

## 2013-11-08 LAB — COMPLETE METABOLIC PANEL WITH GFR
ALT: 17 U/L (ref 0–53)
AST: 25 U/L (ref 0–37)
Albumin: 4.3 g/dL (ref 3.5–5.2)
Alkaline Phosphatase: 73 U/L (ref 39–117)
BUN: 13 mg/dL (ref 6–23)
CALCIUM: 9.3 mg/dL (ref 8.4–10.5)
CO2: 24 meq/L (ref 19–32)
CREATININE: 0.95 mg/dL (ref 0.50–1.35)
Chloride: 100 mEq/L (ref 96–112)
GFR, Est Non African American: >89 mL/min
Glucose, Bld: 96 mg/dL (ref 70–99)
Potassium: 3.5 mEq/L (ref 3.5–5.3)
SODIUM: 136 meq/L (ref 135–145)
Total Bilirubin: 0.3 mg/dL (ref 0.2–1.2)
Total Protein: 8.1 g/dL (ref 6.0–8.3)

## 2013-11-09 LAB — T-HELPER CELL (CD4) - (RCID CLINIC ONLY)
CD4 T CELL HELPER: 24 % — AB (ref 33–55)
CD4 T Cell Abs: 510 /uL (ref 400–2700)

## 2013-11-09 LAB — HIV-1 RNA QUANT-NO REFLEX-BLD
HIV 1 RNA QUANT: 38872 {copies}/mL — AB (ref <20)
HIV-1 RNA Quant, Log: 4.59 {Log} — ABNORMAL HIGH (ref <1.30)

## 2013-11-09 LAB — RPR

## 2013-12-03 ENCOUNTER — Encounter: Payer: Self-pay | Admitting: Internal Medicine

## 2013-12-03 ENCOUNTER — Telehealth: Payer: Self-pay | Admitting: *Deleted

## 2013-12-03 ENCOUNTER — Ambulatory Visit (INDEPENDENT_AMBULATORY_CARE_PROVIDER_SITE_OTHER): Payer: Medicaid Other | Admitting: Internal Medicine

## 2013-12-03 VITALS — BP 182/133 | HR 73 | Temp 98.2°F | Wt 225.0 lb

## 2013-12-03 DIAGNOSIS — Z21 Asymptomatic human immunodeficiency virus [HIV] infection status: Secondary | ICD-10-CM

## 2013-12-03 DIAGNOSIS — R220 Localized swelling, mass and lump, head: Secondary | ICD-10-CM

## 2013-12-03 DIAGNOSIS — H60399 Other infective otitis externa, unspecified ear: Secondary | ICD-10-CM

## 2013-12-03 DIAGNOSIS — Z23 Encounter for immunization: Secondary | ICD-10-CM

## 2013-12-03 DIAGNOSIS — H6092 Unspecified otitis externa, left ear: Secondary | ICD-10-CM

## 2013-12-03 DIAGNOSIS — R221 Localized swelling, mass and lump, neck: Secondary | ICD-10-CM

## 2013-12-03 DIAGNOSIS — B20 Human immunodeficiency virus [HIV] disease: Secondary | ICD-10-CM

## 2013-12-03 MED ORDER — CIPROFLOXACIN-HYDROCORTISONE 0.2-1 % OT SUSP: 3.0000 [drp] | Freq: Two times a day (BID) | OTIC | Status: DC

## 2013-12-03 MED ORDER — ABACAVIR SULFATE-LAMIVUDINE 600-300 MG PO TABS: 1.0000 | ORAL_TABLET | Freq: Every day | ORAL | Status: DC

## 2013-12-03 MED ORDER — DOLUTEGRAVIR SODIUM 50 MG PO TABS: 50.0000 mg | ORAL_TABLET | Freq: Every day | ORAL | Status: DC

## 2013-12-03 MED ORDER — CHLORHEXIDINE GLUCONATE 0.12 % MT SOLN: 15.0000 mL | Freq: Two times a day (BID) | OROMUCOSAL | Status: DC

## 2013-12-03 NOTE — Progress Notes (Signed)
Patient ID: Leon Nichols, male   DOB: Oct 24, 1965, 48 y.o.   MRN: [id number]       Patient ID: Leon Nichols, male   DOB: Dec 26, 1965, 48 y.o.   MRN: [id number]  HPI [name] is a 48yo M with HIV, Cd 4 coutn of 510/VL [id number]. OFF of ARV since Jan 2015 due to loss of medicaid and did not return to clinic for his follow up appt. He previously was on tivicay/epzicom and have good viral suppression at the last visit in April 2014. He is has been out of care.He recently has had his medicaid reinstated. He went to ED for chronic left ear pain and was prescribed HTN medications. Still not taking them as of yet this morning. " not used to taking meds regularly"  Lab Results  Component Value Date   HIV1RNAQUANT 38872* 11/08/2013   HIV1RNAQUANT 41* 07/27/2012   HIV1RNAQUANT 3675* 04/25/2012     Outpatient Encounter Prescriptions as of 12/03/2013  Medication Sig  . hydrochlorothiazide (HYDRODIURIL) 25 MG tablet Take 1 tablet (25 mg total) by mouth daily.  Marland Kitchen lisinopril (PRINIVIL,ZESTRIL) 40 MG tablet Take 1 tablet (40 mg total) by mouth daily.  Marland Kitchen abacavir-lamiVUDine (EPZICOM) 600-300 MG per tablet Take 1 tablet by mouth daily.  Roselie Awkward Sodium (TIVICAY) 50 MG TABS Take 1 tablet (50 mg total) by mouth daily.  Marland Kitchen HYDROcodone-acetaminophen (NORCO/VICODIN) 5-325 MG per tablet Take 1-2 tablets every 6 hours as needed for severe pain  . sulfamethoxazole-trimethoprim (BACTRIM DS,SEPTRA DS) 800-160 MG per tablet Take one tablet 3 times per week (Monday, Wednesday, Friday).     Patient Active Problem List   Diagnosis Date Noted  . Left ear pain 07/14/2012  . HIV (human immunodeficiency virus infection) 03/24/2012  . Anemia 03/23/2012  . Penile ulcer 03/23/2012  . Perianal abscess and fistula 03/23/2012  . Keratitis 03/23/2012  . Herpes zoster complicated 09/24/2011  . Diverticulosis of colon (without mention of hemorrhage) 08/31/2011  . Colitis 08/29/2011  . HYPERTENSION, BENIGN ESSENTIAL 03/29/2007       Health Maintenance Due  Topic Date Due  . Tetanus/tdap  10/27/1986  . Influenza Vaccine  11/03/2013     Review of Systems + teeth sensitivity. Left ear pain Physical Exam   BP 182/133  Pulse 73  Temp(Src) 98.2 F (36.8 C) (Oral)  Wt 225 lb (102.059 kg) Physical Exam  Constitutional: He is oriented to person, place, and time. He appears well-developed and well-nourished. No distress.  HENT: TM right is clear. TM of left slight erythema in external canal. Gum line is edematous Mouth/Throat: Oropharynx is clear and moist. No oropharyngeal exudate. hallitosis Cardiovascular: Normal rate, regular rhythm and normal heart sounds. Exam reveals no gallop and no friction rub.  No murmur heard.  Pulmonary/Chest: Effort normal and breath sounds normal. No respiratory distress. He has no wheezes.  Abdominal: Soft. Bowel sounds are normal. He exhibits no distension. There is no tenderness.  Lymphadenopathy:  He has no cervical adenopathy.  Neurological: He is alert and oriented to person, place, and time.  Skin: Skin is warm and dry. No rash noted. No erythema.  Psychiatric: He has a normal mood and affect. His behavior is normal.    Lab Results  Component Value Date   CD4TCELL 24* 11/08/2013   Lab Results  Component Value Date   CD4TABS 510 11/08/2013   CD4TABS 590 07/27/2012   CD4TABS 420 04/25/2012   Lab Results  Component Value Date   HIV1RNAQUANT 62130* 11/08/2013  No results found for this basename: HEPBSAB   No results found for this basename: RPR    CBC Lab Results  Component Value Date   WBC 4.2 11/08/2013   RBC 5.17 11/08/2013   HGB 14.0 11/08/2013   HCT 41.4 11/08/2013   PLT 166 11/08/2013   MCV 80.1 11/08/2013   MCH 27.1 11/08/2013   MCHC 33.8 11/08/2013   RDW 18.4* 11/08/2013   LYMPHSABS 1.8 11/08/2013   MONOABS 0.6 11/08/2013   EOSABS 0.0 11/08/2013   BASOSABS 0.0 11/08/2013   BMET Lab Results  Component Value Date   NA 136 11/08/2013   K 3.5 11/08/2013   CL 100 11/08/2013    CO2 24 11/08/2013   GLUCOSE 96 11/08/2013   BUN 13 11/08/2013   CREATININE 0.95 11/08/2013   CALCIUM 9.3 11/08/2013   GFRNONAA >89 11/08/2013   GFRAA >89 11/08/2013     Assessment and Plan  hiv = poorly controlled since being of ARV.  Will get genotype and II geno to see if can restart back on his previous regimen of tivicay and epzicom  htn = poorly controlled will have him start back on lisinopril nad hctz. Have him come back for BP check  Health maintenance = give flu shot today  Otitis externa = will do a trial of ear drops to see if it improved. Minimize q-tip use  peridontal disease = will give rx for peridex to see if it helps    rtc in 4 wks = roughly will be on meds for 4 wks at that time.

## 2013-12-03 NOTE — Telephone Encounter (Signed)
Cipro HC Otic not covered by Medicaid.  Fax given to Dr. Drue Second for review.

## 2013-12-12 LAB — HIV-1 INTEGRASE GENOTYPE

## 2013-12-13 ENCOUNTER — Encounter: Payer: Self-pay | Admitting: Internal Medicine

## 2013-12-13 LAB — HIV-1 GENOTYPR PLUS

## 2013-12-31 ENCOUNTER — Ambulatory Visit: Payer: Medicaid Other | Admitting: Internal Medicine

## 2014-01-01 ENCOUNTER — Ambulatory Visit (INDEPENDENT_AMBULATORY_CARE_PROVIDER_SITE_OTHER): Payer: Medicaid Other | Admitting: Internal Medicine

## 2014-01-01 VITALS — BP 160/113 | HR 80 | Temp 97.3°F | Wt 239.0 lb

## 2014-01-01 DIAGNOSIS — R220 Localized swelling, mass and lump, head: Secondary | ICD-10-CM

## 2014-01-01 DIAGNOSIS — Z21 Asymptomatic human immunodeficiency virus [HIV] infection status: Secondary | ICD-10-CM

## 2014-01-01 DIAGNOSIS — I10 Essential (primary) hypertension: Secondary | ICD-10-CM

## 2014-01-01 DIAGNOSIS — R221 Localized swelling, mass and lump, neck: Secondary | ICD-10-CM

## 2014-01-01 LAB — COMPLETE METABOLIC PANEL WITH GFR
ALT: 10 U/L (ref 0–53)
AST: 13 U/L (ref 0–37)
Albumin: 3.8 g/dL (ref 3.5–5.2)
Alkaline Phosphatase: 61 U/L (ref 39–117)
BILIRUBIN TOTAL: 0.2 mg/dL (ref 0.2–1.2)
BUN: 11 mg/dL (ref 6–23)
CHLORIDE: 102 meq/L (ref 96–112)
CO2: 28 mEq/L (ref 19–32)
Calcium: 9.4 mg/dL (ref 8.4–10.5)
Creat: 0.89 mg/dL (ref 0.50–1.35)
GFR, Est African American: >89 mL/min
GFR, Est Non African American: >89 mL/min
GLUCOSE: 123 mg/dL — AB (ref 70–99)
Potassium: 3.8 mEq/L (ref 3.5–5.3)
SODIUM: 138 meq/L (ref 135–145)
TOTAL PROTEIN: 7.1 g/dL (ref 6.0–8.3)

## 2014-01-01 LAB — CBC WITH DIFFERENTIAL/PLATELET
Basophils Absolute: 0 10*3/uL (ref 0.0–0.1)
Basophils Relative: 0 % (ref 0–1)
Eosinophils Absolute: 0.3 10*3/uL (ref 0.0–0.7)
Eosinophils Relative: 6 % — ABNORMAL HIGH (ref 0–5)
HCT: 39.3 % (ref 39.0–52.0)
HEMOGLOBIN: 13.2 g/dL (ref 13.0–17.0)
LYMPHS ABS: 1.8 10*3/uL (ref 0.7–4.0)
LYMPHS PCT: 37 % (ref 12–46)
MCH: 28.4 pg (ref 26.0–34.0)
MCHC: 33.6 g/dL (ref 30.0–36.0)
MCV: 84.7 fL (ref 78.0–100.0)
Monocytes Absolute: 0.5 10*3/uL (ref 0.1–1.0)
Monocytes Relative: 11 % (ref 3–12)
NEUTROS ABS: 2.3 10*3/uL (ref 1.7–7.7)
Neutrophils Relative %: 46 % (ref 43–77)
PLATELETS: 184 10*3/uL (ref 150–400)
RBC: 4.64 MIL/uL (ref 4.22–5.81)
RDW: 18 % — ABNORMAL HIGH (ref 11.5–15.5)
WBC: 4.9 10*3/uL (ref 4.0–10.5)

## 2014-01-01 MED ORDER — LISINOPRIL 40 MG PO TABS: 40.0000 mg | ORAL_TABLET | Freq: Every day | ORAL | Status: DC

## 2014-01-01 MED ORDER — CHLORHEXIDINE GLUCONATE 0.12 % MT SOLN: 15.0000 mL | Freq: Two times a day (BID) | OROMUCOSAL | Status: DC

## 2014-01-01 MED ORDER — HYDROCHLOROTHIAZIDE 25 MG PO TABS: 25.0000 mg | ORAL_TABLET | Freq: Every day | ORAL | Status: DC

## 2014-01-01 NOTE — Progress Notes (Signed)
Patient ID: Leon Nichols, male   DOB: 17-Jun-1965, 48 y.o.   MRN: [id number]       Patient ID: Leon Nichols, male   DOB: 17-Jun-1965, 48 y.o.   MRN: [id number]  HPI 48 yo M with HIV disease, CD 4 count 510/VL 38,872, restarted on tivicay/epzicom. Doing well with adherence. He continues to take his BP meds of HCTZ, lisinopril.  Also noticed improvement with peridex for hallitosis.  Previously had shingles to facial nerve V3 distribution. Involving ear, jaw, chin on left side of face, and astill feels that he has residual discomfort, ithcing of left ear, from it. Uses qtips  Seeing a new primary care doctor tomorrow  Outpatient Encounter Prescriptions as of 01/01/2014  Medication Sig  . abacavir-lamiVUDine (EPZICOM) 600-300 MG per tablet Take 1 tablet by mouth daily.  . chlorhexidine (PERIDEX) 0.12 % solution Use as directed 15 mLs in the mouth or throat 2 (two) times daily.  . ciprofloxacin-hydrocortisone (CIPRO HC OTIC) otic suspension Place 3 drops into the left ear 2 (two) times daily. X 7 days  . dolutegravir (TIVICAY) 50 MG tablet Take 1 tablet (50 mg total) by mouth daily.  . hydrochlorothiazide (HYDRODIURIL) 25 MG tablet Take 1 tablet (25 mg total) by mouth daily.  Marland Kitchen. HYDROcodone-acetaminophen (NORCO/VICODIN) 5-325 MG per tablet Take 1-2 tablets every 6 hours as needed for severe pain  . lisinopril (PRINIVIL,ZESTRIL) 40 MG tablet Take 1 tablet (40 mg total) by mouth daily.  Marland Kitchen. sulfamethoxazole-trimethoprim (BACTRIM DS,SEPTRA DS) 800-160 MG per tablet Take one tablet 3 times per week (Monday, Wednesday, Friday).     Patient Active Problem List   Diagnosis Date Noted  . Left ear pain 07/14/2012  . HIV (human immunodeficiency virus infection) 03/24/2012  . Anemia 03/23/2012  . Penile ulcer 03/23/2012  . Perianal abscess and fistula 03/23/2012  . Keratitis 03/23/2012  . Herpes zoster complicated 09/24/2011  . Diverticulosis of colon (without mention of hemorrhage) 08/31/2011  .  Colitis 08/29/2011  . HYPERTENSION, BENIGN ESSENTIAL 03/29/2007     Health Maintenance Due  Topic Date Due  . Tetanus/tdap  10/27/1986     Review of Systems Positive pertinents listed in hpi Physical Exam   There were no vitals taken for this visit. Physical Exam  Constitutional: He is oriented to person, place, and time. He appears well-developed and well-nourished. No distress.  HENT:  Mouth/Throat: Oropharynx is clear and moist. No oropharyngeal exudate.  Cardiovascular: Normal rate, regular rhythm and normal heart sounds. Exam reveals no gallop and no friction rub.  No murmur heard.  Pulmonary/Chest: Effort normal and breath sounds normal. No respiratory distress. He has no wheezes.  Abdominal: Soft. Bowel sounds are normal. He exhibits no distension. There is no tenderness.  Lymphadenopathy:  He has no cervical adenopathy.  Neurological: He is alert and oriented to person, place, and time.  Skin: Skin is warm and dry. No rash noted. No erythema.  Psychiatric: He has a normal mood and affect. His behavior is normal.    Lab Results  Component Value Date   CD4TCELL 24* 11/08/2013   Lab Results  Component Value Date   CD4TABS 510 11/08/2013   CD4TABS 590 07/27/2012   CD4TABS 420 04/25/2012   Lab Results  Component Value Date   HIV1RNAQUANT [id number]* 11/08/2013   No results found for this basename: HEPBSAB   No results found for this basename: RPR    CBC Lab Results  Component Value Date   WBC 4.2 11/08/2013   RBC  5.17 11/08/2013   HGB 14.0 11/08/2013   HCT 41.4 11/08/2013   PLT 166 11/08/2013   MCV 80.1 11/08/2013   MCH 27.1 11/08/2013   MCHC 33.8 11/08/2013   RDW 18.4* 11/08/2013   LYMPHSABS 1.8 11/08/2013   MONOABS 0.6 11/08/2013   EOSABS 0.0 11/08/2013   BASOSABS 0.0 11/08/2013   BMET Lab Results  Component Value Date   NA 136 11/08/2013   K 3.5 11/08/2013   CL 100 11/08/2013   CO2 24 11/08/2013   GLUCOSE 96 11/08/2013   BUN 13 11/08/2013   CREATININE 0.95 11/08/2013   CALCIUM 9.3  11/08/2013   GFRNONAA >89 11/08/2013   GFRAA >89 11/08/2013     Assessment and Plan  hiv = will check labs today and continue on current regimen  hallitosis = refill on peridex but all recommend seeing dentist  Health maintenance = already received flu vaccine

## 2014-01-02 ENCOUNTER — Encounter: Payer: Self-pay | Admitting: Internal Medicine

## 2014-01-02 ENCOUNTER — Ambulatory Visit (INDEPENDENT_AMBULATORY_CARE_PROVIDER_SITE_OTHER): Payer: Medicaid Other | Admitting: Internal Medicine

## 2014-01-02 VITALS — BP 130/103 | HR 76 | Temp 98.4°F | Ht 73.0 in | Wt 237.6 lb

## 2014-01-02 DIAGNOSIS — B0229 Other postherpetic nervous system involvement: Secondary | ICD-10-CM

## 2014-01-02 DIAGNOSIS — K0510 Chronic gingivitis, plaque induced: Secondary | ICD-10-CM | POA: Insufficient documentation

## 2014-01-02 DIAGNOSIS — I10 Essential (primary) hypertension: Secondary | ICD-10-CM

## 2014-01-02 LAB — T-HELPER CELL (CD4) - (RCID CLINIC ONLY)
CD4 % Helper T Cell: 28 % — ABNORMAL LOW (ref 33–55)
CD4 T CELL ABS: 500 /uL (ref 400–2700)

## 2014-01-02 MED ORDER — BENZOCAINE 20 % OT SOLN: 1.0000 [drp] | Freq: Four times a day (QID) | OTIC | Status: DC | PRN

## 2014-01-02 MED ORDER — ANTIPYRINE-BENZOCAINE 54-14 MG/ML OT SOLN: 2.0000 [drp] | OTIC | Status: DC | PRN

## 2014-01-02 NOTE — Progress Notes (Signed)
   Subjective:    Patient ID: Leon Nichols, male    DOB: 07-30-1965, 48 y.o.   MRN: [id number]  HPI Leon Nichols is a 48 year old man with PMH of HIV(last CD4 count 510 on 11/08/13), and HTN, presenting for evaluation of his blood pressure.  He had shingles previously with facial nerve V3 distribution that has left residual left ear and left jaw pain. He saw an ENT physician for the ear discomfort in the past and would like to be seen there again. He states that the ear pain is more noticeable at night. He uses Qtips to scratch his left inner ear with some relief of the itching sensation. He has used ear drops recently that did not provide much relief.  He also has right gum pain and swelling, he has not seen a dentist in over one ear.    Review of Systems  Constitutional: Negative for fever, chills, diaphoresis, activity change, appetite change, fatigue and unexpected weight change.  HENT: Positive for ear pain. Negative for congestion.        Gum pain and swelling   Respiratory: Negative for cough, shortness of breath and wheezing.   Cardiovascular: Negative for chest pain and leg swelling.  Gastrointestinal: Negative for abdominal pain.  Genitourinary: Negative for dysuria.  Neurological: Negative for dizziness, weakness and light-headedness.       Objective:   Physical Exam  Nursing note and vitals reviewed. Constitutional: He is oriented to person, place, and time. He appears well-developed and well-nourished. No distress.  HENT:  Right Ear: External ear normal.  Left Ear: External ear normal.  Nose: Nose normal.  Mouth/Throat: Oropharynx is clear and moist. No oropharyngeal exudate.  Gums are red with no abscess or bleeding noted No facial TTP  Cardiovascular: Normal rate.   Pulmonary/Chest: Effort normal. No respiratory distress. He has no wheezes. He has no rales.  Neurological: He is alert and oriented to person, place, and time. Coordination normal.  Skin: Skin is warm and  dry. He is not diaphoretic.  Psychiatric: He has a normal mood and affect.          Assessment & Plan:

## 2014-01-02 NOTE — Assessment & Plan Note (Signed)
No signs of gum bleeding but red on exam. Also has halitosis. No dental abscess noted but has not had dental exam >3482yr.  -referred to dentist

## 2014-01-02 NOTE — Patient Instructions (Signed)
General Instructions: -Your blood pressure is well controlled today. Continue taking your medications as prescribed.  -You have been referred to ENT for the ear and jaw pain.  -You have been referred to the Dentist for the gum pain.  -Follow up with Leon Nichols in 3 months for blood pressure recheck.    Please bring your medicines with you each time you come.   Medicines may be  Eye drops  Herbal   Vitamins  Pills  Seeing these help Leon Nichols take care of you.   Treatment Goals:  Goals (1 Years of Data) as of 01/02/14         As of Today 01/01/14 12/03/13 10/20/13 07/14/12     Blood Pressure    . Blood Pressure < 140/90  130/103 160/113 182/133 160/115 131/90      Progress Toward Treatment Goals:  Treatment Goal 01/02/2014  Blood pressure at goal    Self Care Goals & Plans:  Self Care Goal 01/02/2014  Manage my medications take my medicines as prescribed; refill my medications on time; bring my medications to every visit  Monitor my health keep track of my blood pressure  Eat healthy foods eat more vegetables; eat foods that are low in salt; eat baked foods instead of fried foods  Be physically active find an activity I enjoy  Stop smoking set a quit date and stop smoking    No flowsheet data found.   Care Management & Community Referrals:  Referral 04/13/2012  Referrals made for care management support none needed

## 2014-01-02 NOTE — Assessment & Plan Note (Addendum)
He had shingles months ago and has residual pain in his left ear and left jaw. Reports seeing ENT before and would like a referral back. The pain is mostly present at night and does not appear to interfere with his daily activities and he reports no decreased hearing.  -Pt cautioned about excessive use of Qtips.  -Would avoid Abx ear drops at this time -Rx benzocaine ear drop 4 x daily and at bedtime into left ear but this medication was not covered by his insurance  -Called pharmacy, prescribed Auralgan otic sol, 2 drops in left ear PRN for pain 1 bottle x1 refill.  -Referred back to ENT

## 2014-01-02 NOTE — Assessment & Plan Note (Signed)
BP Readings from Last 3 Encounters:  01/02/14 130/103  01/01/14 160/113  12/03/13 182/133    Lab Results  Component Value Date   NA 138 01/01/2014   K 3.8 01/01/2014   CREATININE 0.89 01/01/2014    Assessment: Blood pressure control:  Controlled Progress toward BP goal:   At goal Comments: He has restarted taking his lisinopril 40mg  daily and HCTZ 25mg  daily which he had been out of for 30 days.   Plan: Medications:  continue current medications Educational resources provided:   Self management tools provided:   Other plans: Follow up in 3 months.

## 2014-01-03 LAB — HIV-1 RNA QUANT-NO REFLEX-BLD
HIV 1 RNA Quant: 143 copies/mL — ABNORMAL HIGH (ref <20)
HIV-1 RNA Quant, Log: 2.16 {Log} — ABNORMAL HIGH (ref <1.30)

## 2014-01-04 NOTE — Progress Notes (Signed)
INTERNAL MEDICINE TEACHING ATTENDING ADDENDUM - Woods Gangemi, MD: I reviewed and discussed at the time of visit with the resident Dr. Kennerly, the patient's medical history, physical examination, diagnosis and results of pertinent tests and treatment and I agree with the patient's care as documented.  

## 2014-01-21 NOTE — Addendum Note (Signed)
Addended by: Neomia DearPOWERS, Jhaniya Briski E on: 01/21/2014 06:08 PM   Modules accepted: Orders

## 2014-01-31 IMAGING — CT CT ABD-PELV W/ CM
2 of 6 series · 16 of 46 positions shown, 18 images · IV contrast (APPLIED)
Comparison: None.

CLINICAL DATA: Boils in the pelvic area.

CT ABDOMEN AND PELVIS WITH CONTRAST
TECHNIQUE: Multidetector CT imaging of the abdomen and pelvis was
performed following the standard protocol during bolus
administration of intravenous contrast. Oral and rectal contrast
was given for this examination.
Contrast: 125mL OMNIPAQUE IOHEXOL 300 MG/ML  SOLN

[Series 2: abd/pelv with 5.0 b31f st · axial · 0.86mm/px · z∈[+890,+1370]mm · 13 of 109 slices shown, 15 images]
[im 7/109  soft-tissue]
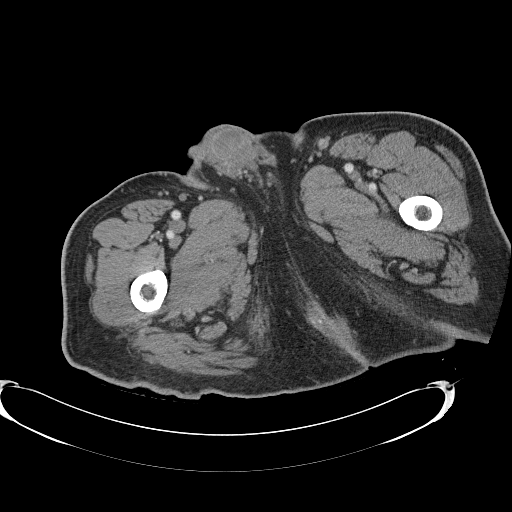
[im 7/109  bone]
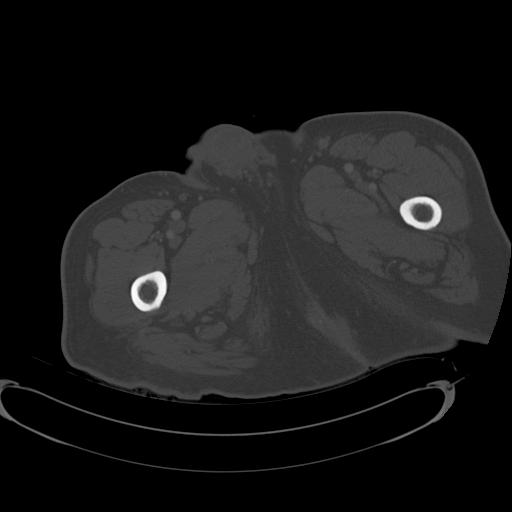
[im 13/109  soft-tissue]
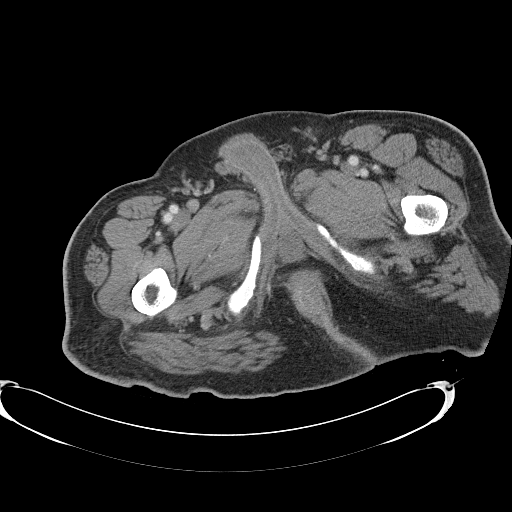
[im 25/109  soft-tissue]
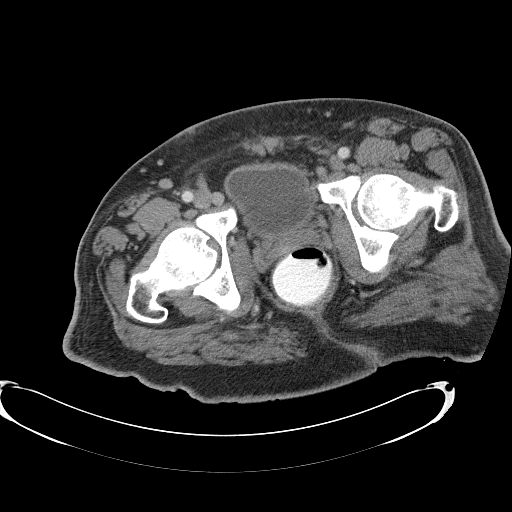
[im 31/109  soft-tissue]
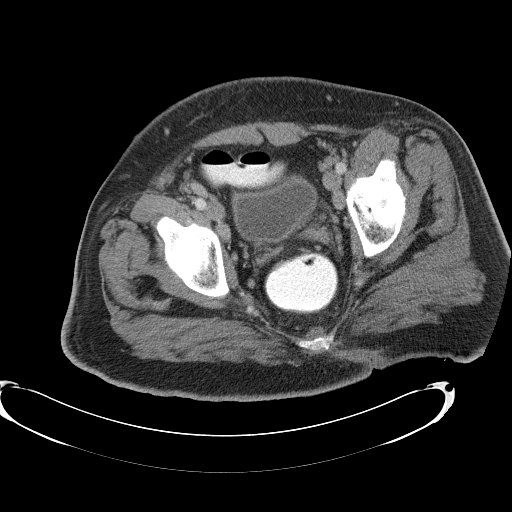
[im 37/109  soft-tissue]
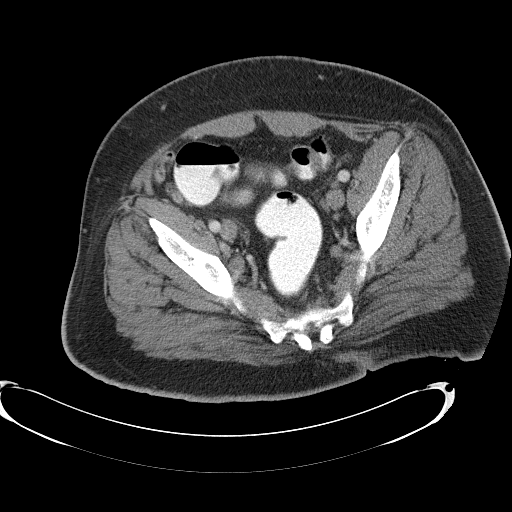
[im 49/109  soft-tissue]
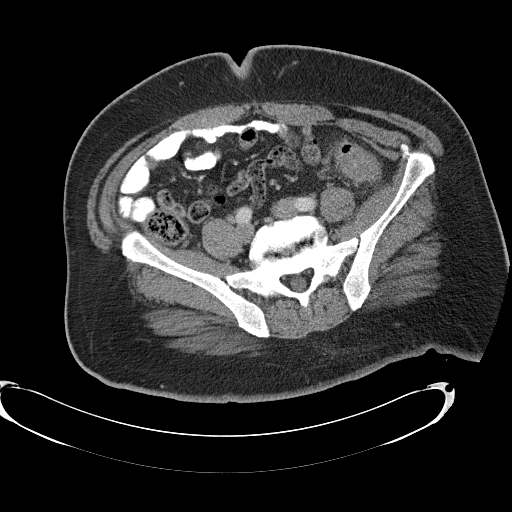
[im 55/109  soft-tissue]
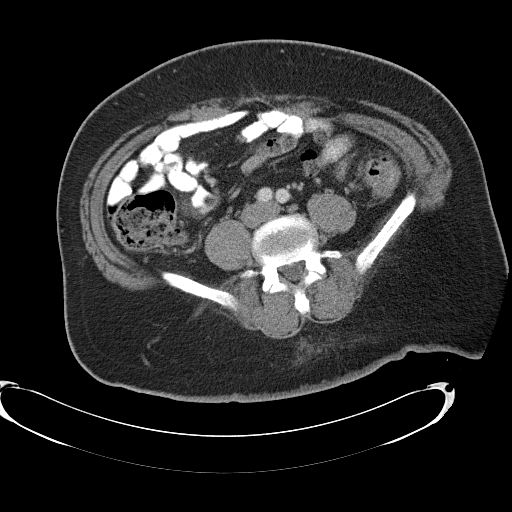
[im 61/109  soft-tissue]
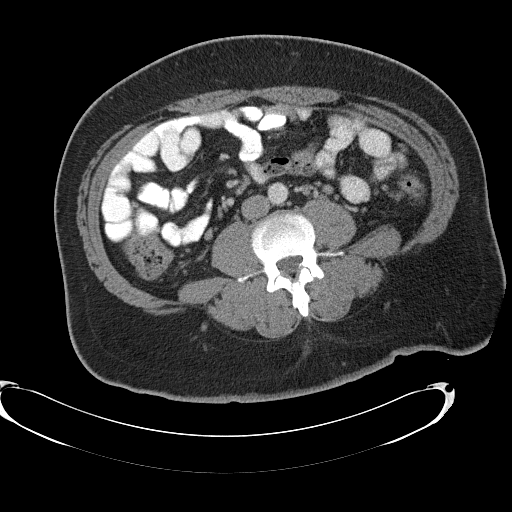
[im 73/109  soft-tissue]
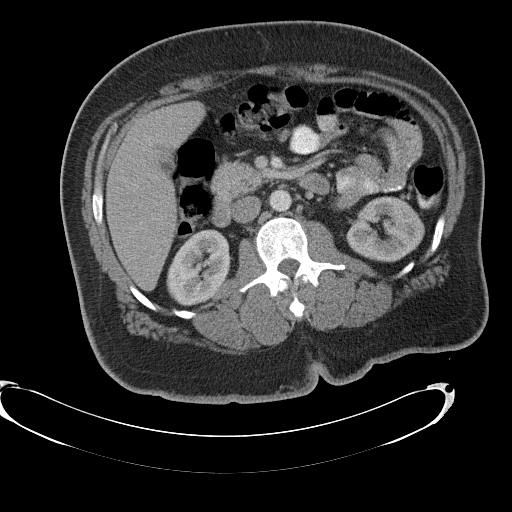
[im 73/109  bone]
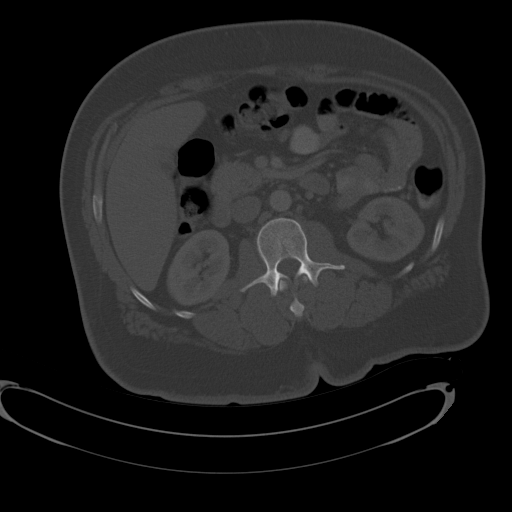
[im 79/109  soft-tissue]
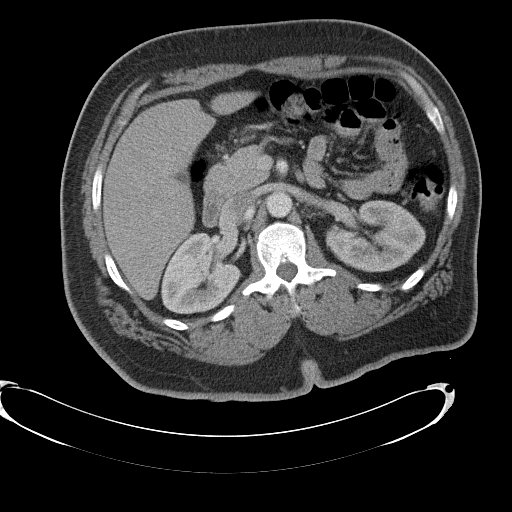
[im 85/109  soft-tissue]
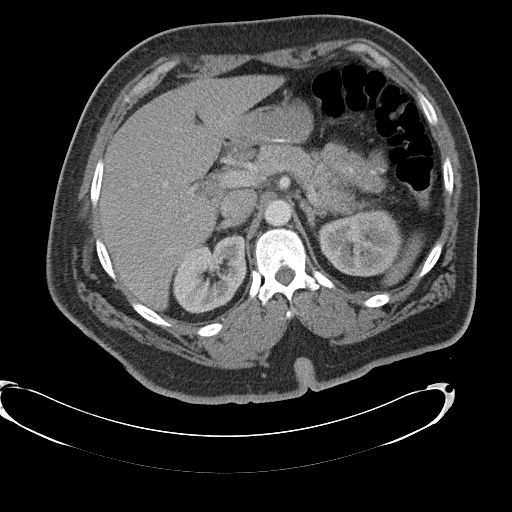
[im 97/109  soft-tissue]
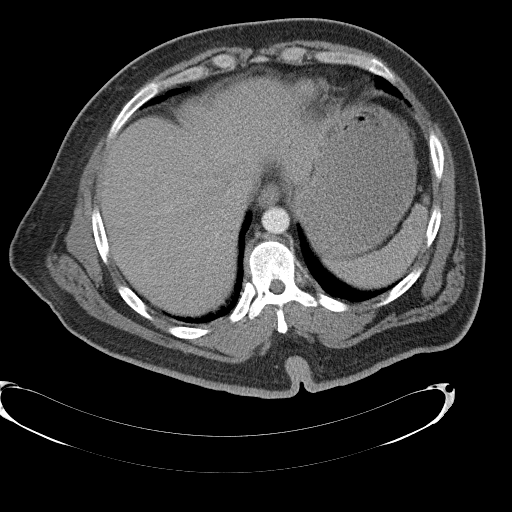
[im 103/109  soft-tissue]
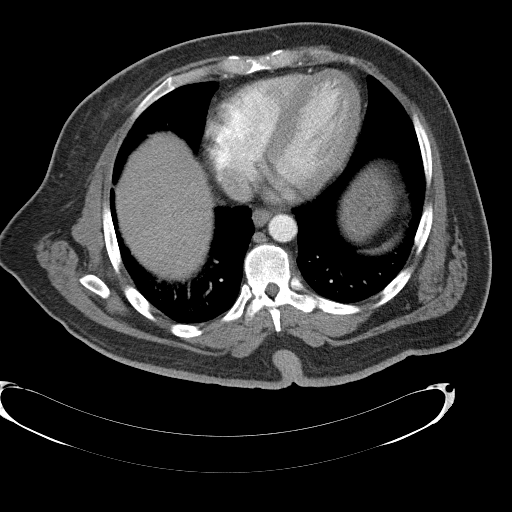

[Series 602: <mpr range> · coronal · 1.06mm/px · 3 of 156 slices shown]
[im 52/156  soft-tissue]
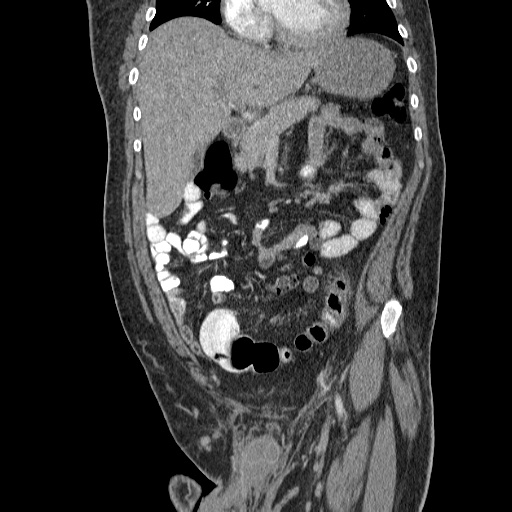
[im 69/156  soft-tissue]
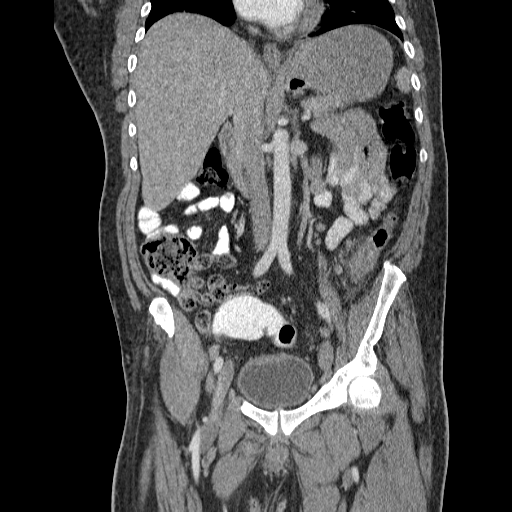
[im 87/156  soft-tissue]
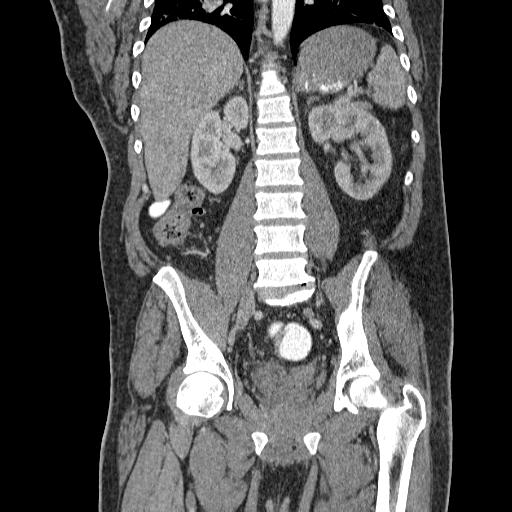

[16 of 46 positions shown; findings below may reference images not displayed]

FINDINGS: Lungs clear with exception of dependent atelectasis.
There is no evidence for free intraperitoneal air.

The gallbladder has an unusual appearance and may be within the
liver parenchyma or capsule.  The gallbladder is very small and the
tip may be folded on itself.  Difficult to exclude a small cystic
structure near the tip of the gallbladder.  There is no evidence
for acute inflammation.

Main portal venous system appears to be patent.  Normal appearance
of the pancreas, adrenal glands, kidneys and spleen.  There are
multiple lymph nodes throughout the inguinal regions bilaterally.
There are prominent retroperitoneal nodes in the pelvis.  Index
node along the right pelvic sidewall measures 1.1 x 1.5 cm on
sequence 2, image 79.  There are small scattered lymph nodes along
the iliac chains bilaterally.

There is wall thickening of the lower left colon and sigmoid colon.
There is also edema and stranding in the sigmoid mesocolon.  There
are prominent lymph node adjacent to the inflamed colon on sequence
2, image 58.  Additional lymph nodes on image 52.  The rectal
contrast stops at the level of this colonic wall thickening.

There is a cutaneous ulcer and fissure along the left side of the
back that extends towards the coccyx bone.  There is a small amount
of soft tissue just anterior to the coccyx bone which could be
inflammatory.  There is no evidence for a drainable abscess
collection.

There is no significant free fluid.  No gross abnormality to the
prostate, seminal vessels or urinary bladder.  Degenerative changes
at L5-S1.
IMPRESSION: Wall thickening of the sigmoid colon and lower descending colon.
There is adjacent inflammation and lymphadenopathy.  The findings
could be related to acute diverticulitis or colitis.  However, the
adjacent lymph nodes also raise concern for an underlying neoplasm.
Recommend GI consultation.

Cutaneous ulceration and fissure that extends towards the coccyx.
Small amount of soft tissue just anterior to the coccyx may be
inflammatory.  There are no drainable abscess collections.

Unusual appearance of the gallbladder.  This could represent a
gallbladder variant such as an intrahepatic gallbladder.  There is
no evidence for acute inflammation.  This area may be further
evaluated with ultrasound.

These results were called by telephone on 08/29/2011  at  [DATE]
p.m. to   Emanuell Or, who verbally acknowledged these results.

## 2014-02-02 ENCOUNTER — Inpatient Hospital Stay (HOSPITAL_COMMUNITY): Payer: Medicaid Other

## 2014-02-02 ENCOUNTER — Inpatient Hospital Stay (HOSPITAL_COMMUNITY)
Admission: EM | Admit: 2014-02-02 | Discharge: 2014-02-04 | DRG: 392 | Disposition: A | Discharge status: Home / Self Care | Payer: Medicaid Other | Attending: Internal Medicine | Admitting: Internal Medicine

## 2014-02-02 ENCOUNTER — Emergency Department (HOSPITAL_COMMUNITY): Payer: Medicaid Other

## 2014-02-02 ENCOUNTER — Encounter (HOSPITAL_COMMUNITY): Payer: Self-pay | Admitting: Emergency Medicine

## 2014-02-02 DIAGNOSIS — Z79899 Other long term (current) drug therapy: Secondary | ICD-10-CM | POA: Diagnosis not present

## 2014-02-02 DIAGNOSIS — Z933 Colostomy status: Secondary | ICD-10-CM | POA: Diagnosis not present

## 2014-02-02 DIAGNOSIS — N179 Acute kidney failure, unspecified: Secondary | ICD-10-CM | POA: Diagnosis present

## 2014-02-02 DIAGNOSIS — E872 Acidosis: Secondary | ICD-10-CM | POA: Diagnosis not present

## 2014-02-02 DIAGNOSIS — K9419 Other complications of enterostomy: Secondary | ICD-10-CM

## 2014-02-02 DIAGNOSIS — Z21 Asymptomatic human immunodeficiency virus [HIV] infection status: Secondary | ICD-10-CM | POA: Diagnosis present

## 2014-02-02 DIAGNOSIS — R109 Unspecified abdominal pain: Secondary | ICD-10-CM

## 2014-02-02 DIAGNOSIS — B0229 Other postherpetic nervous system involvement: Secondary | ICD-10-CM | POA: Diagnosis present

## 2014-02-02 DIAGNOSIS — R197 Diarrhea, unspecified: Secondary | ICD-10-CM

## 2014-02-02 DIAGNOSIS — E860 Dehydration: Secondary | ICD-10-CM | POA: Diagnosis present

## 2014-02-02 DIAGNOSIS — F17210 Nicotine dependence, cigarettes, uncomplicated: Secondary | ICD-10-CM | POA: Diagnosis present

## 2014-02-02 DIAGNOSIS — R112 Nausea with vomiting, unspecified: Secondary | ICD-10-CM

## 2014-02-02 DIAGNOSIS — K529 Noninfective gastroenteritis and colitis, unspecified: Secondary | ICD-10-CM | POA: Diagnosis not present

## 2014-02-02 DIAGNOSIS — I10 Essential (primary) hypertension: Secondary | ICD-10-CM | POA: Diagnosis present

## 2014-02-02 DIAGNOSIS — A084 Viral intestinal infection, unspecified: Principal | ICD-10-CM | POA: Diagnosis present

## 2014-02-02 DIAGNOSIS — F1410 Cocaine abuse, uncomplicated: Secondary | ICD-10-CM | POA: Diagnosis present

## 2014-02-02 DIAGNOSIS — B20 Human immunodeficiency virus [HIV] disease: Secondary | ICD-10-CM | POA: Diagnosis present

## 2014-02-02 DIAGNOSIS — I951 Orthostatic hypotension: Secondary | ICD-10-CM | POA: Diagnosis not present

## 2014-02-02 DIAGNOSIS — E861 Hypovolemia: Secondary | ICD-10-CM | POA: Diagnosis not present

## 2014-02-02 DIAGNOSIS — N19 Unspecified kidney failure: Secondary | ICD-10-CM

## 2014-02-02 DIAGNOSIS — R1033 Periumbilical pain: Secondary | ICD-10-CM

## 2014-02-02 LAB — URINE MICROSCOPIC-ADD ON

## 2014-02-02 LAB — CBC WITH DIFFERENTIAL/PLATELET
Basophils Absolute: 0 10*3/uL (ref 0.0–0.1)
Basophils Relative: 0 % (ref 0–1)
Eosinophils Absolute: 0 10*3/uL (ref 0.0–0.7)
Eosinophils Relative: 1 % (ref 0–5)
HCT: 40.4 % (ref 39.0–52.0)
Hemoglobin: 14.4 g/dL (ref 13.0–17.0)
Lymphocytes Relative: 39 % (ref 12–46)
Lymphs Abs: 3.5 10*3/uL (ref 0.7–4.0)
MCH: 29.6 pg (ref 26.0–34.0)
MCHC: 35.6 g/dL (ref 30.0–36.0)
MCV: 83 fL (ref 78.0–100.0)
Monocytes Absolute: 0.8 10*3/uL (ref 0.1–1.0)
Monocytes Relative: 9 % (ref 3–12)
Neutro Abs: 4.6 10*3/uL (ref 1.7–7.7)
Neutrophils Relative %: 51 % (ref 43–77)
Platelets: 255 10*3/uL (ref 150–400)
RBC: 4.87 MIL/uL (ref 4.22–5.81)
RDW: 16 % — ABNORMAL HIGH (ref 11.5–15.5)
WBC: 8.8 10*3/uL (ref 4.0–10.5)

## 2014-02-02 LAB — RAPID URINE DRUG SCREEN, HOSP PERFORMED
AMPHETAMINES: NOT DETECTED
BARBITURATES: NOT DETECTED
Benzodiazepines: NOT DETECTED
COCAINE: POSITIVE — AB
Opiates: NOT DETECTED
Tetrahydrocannabinol: NOT DETECTED

## 2014-02-02 LAB — URINALYSIS, ROUTINE W REFLEX MICROSCOPIC
Bilirubin Urine: NEGATIVE
Glucose, UA: NEGATIVE mg/dL
Hgb urine dipstick: NEGATIVE
Ketones, ur: 15 mg/dL — AB
Leukocytes, UA: NEGATIVE
Nitrite: NEGATIVE
Protein, ur: 100 mg/dL — AB
Specific Gravity, Urine: 1.022 (ref 1.005–1.030)
Urobilinogen, UA: 0.2 mg/dL (ref 0.0–1.0)
pH: 5 (ref 5.0–8.0)

## 2014-02-02 LAB — COMPREHENSIVE METABOLIC PANEL
ALT: 8 U/L (ref 0–53)
AST: 13 U/L (ref 0–37)
Albumin: 3.7 g/dL (ref 3.5–5.2)
Alkaline Phosphatase: 68 U/L (ref 39–117)
Anion gap: 26 — ABNORMAL HIGH (ref 5–15)
BUN: 125 mg/dL — ABNORMAL HIGH (ref 6–23)
CO2: 16 mEq/L — ABNORMAL LOW (ref 19–32)
Calcium: 9 mg/dL (ref 8.4–10.5)
Chloride: 88 mEq/L — ABNORMAL LOW (ref 96–112)
Creatinine, Ser: 8.81 mg/dL — ABNORMAL HIGH (ref 0.50–1.35)
GFR calc Af Amer: 7 mL/min — ABNORMAL LOW (ref >90)
GFR calc non Af Amer: 6 mL/min — ABNORMAL LOW (ref >90)
Glucose, Bld: 104 mg/dL — ABNORMAL HIGH (ref 70–99)
Potassium: 4.4 mEq/L (ref 3.7–5.3)
Sodium: 130 mEq/L — ABNORMAL LOW (ref 137–147)
Total Bilirubin: <0.2 mg/dL — ABNORMAL LOW (ref 0.3–1.2)
Total Protein: 8.6 g/dL — ABNORMAL HIGH (ref 6.0–8.3)

## 2014-02-02 LAB — CREATININE, URINE, RANDOM: Creatinine, Urine: 112.48 mg/dL

## 2014-02-02 LAB — SODIUM, URINE, RANDOM: Sodium, Ur: 54 mEq/L

## 2014-02-02 LAB — I-STAT CG4 LACTIC ACID, ED: Lactic Acid, Venous: 2.63 mmol/L — ABNORMAL HIGH (ref 0.5–2.2)

## 2014-02-02 MED ORDER — ACETAMINOPHEN 650 MG RE SUPP: 650.0000 mg | Freq: Four times a day (QID) | RECTAL | Status: DC | PRN

## 2014-02-02 MED ORDER — THIAMINE HCL 100 MG/ML IJ SOLN
100.0000 mg | Freq: Every day | INTRAMUSCULAR | Status: DC
Start: 2014-02-02 — End: 2014-02-03
  Filled 2014-02-02 (×2): qty 1

## 2014-02-02 MED ORDER — ONDANSETRON HCL 4 MG/2ML IJ SOLN: 4.0000 mg | Freq: Four times a day (QID) | INTRAMUSCULAR | Status: DC | PRN

## 2014-02-02 MED ORDER — FOLIC ACID 1 MG PO TABS
1.0000 mg | ORAL_TABLET | Freq: Every day | ORAL | Status: DC
  Administered 2014-02-02 – 2014-02-04 (×3): 1 mg via ORAL
  Filled 2014-02-02 (×3): qty 1

## 2014-02-02 MED ORDER — SODIUM CHLORIDE 0.9 % IV SOLN
INTRAVENOUS | Status: DC
  Administered 2014-02-02: 13:00:00 via INTRAVENOUS

## 2014-02-02 MED ORDER — LORAZEPAM 2 MG/ML IJ SOLN: 1.0000 mg | Freq: Four times a day (QID) | INTRAMUSCULAR | Status: DC | PRN

## 2014-02-02 MED ORDER — LORAZEPAM 1 MG PO TABS: 1.0000 mg | ORAL_TABLET | Freq: Four times a day (QID) | ORAL | Status: DC | PRN

## 2014-02-02 MED ORDER — ONDANSETRON HCL 4 MG PO TABS: 4.0000 mg | ORAL_TABLET | Freq: Four times a day (QID) | ORAL | Status: DC | PRN

## 2014-02-02 MED ORDER — HYDROCODONE-ACETAMINOPHEN 5-325 MG PO TABS
1.0000 | ORAL_TABLET | Freq: Four times a day (QID) | ORAL | Status: DC | PRN
  Administered 2014-02-02 – 2014-02-03 (×4): 1 via ORAL
  Filled 2014-02-02 (×4): qty 1

## 2014-02-02 MED ORDER — IOHEXOL 300 MG/ML  SOLN
25.0000 mL | INTRAMUSCULAR | Status: DC | PRN
Start: 2014-02-02 — End: 2014-02-02

## 2014-02-02 MED ORDER — ACETAMINOPHEN 325 MG PO TABS: 650.0000 mg | ORAL_TABLET | Freq: Four times a day (QID) | ORAL | Status: DC | PRN

## 2014-02-02 MED ORDER — HEPARIN SODIUM (PORCINE) 5000 UNIT/ML IJ SOLN
5000.0000 [IU] | Freq: Three times a day (TID) | INTRAMUSCULAR | Status: DC
  Administered 2014-02-02 – 2014-02-03 (×5): 5000 [IU] via SUBCUTANEOUS
  Filled 2014-02-02 (×6): qty 1

## 2014-02-02 MED ORDER — HYDROMORPHONE HCL 1 MG/ML IJ SOLN
1.0000 mg | Freq: Once | INTRAMUSCULAR | Status: AC
  Administered 2014-02-02: 1 mg via INTRAVENOUS
  Filled 2014-02-02: qty 1

## 2014-02-02 MED ORDER — ADULT MULTIVITAMIN W/MINERALS CH
1.0000 | ORAL_TABLET | Freq: Every day | ORAL | Status: DC
  Administered 2014-02-02 – 2014-02-04 (×3): 1 via ORAL
  Filled 2014-02-02 (×3): qty 1

## 2014-02-02 MED ORDER — SODIUM CHLORIDE 0.9 % IV BOLUS (SEPSIS)
1000.0000 mL | Freq: Once | INTRAVENOUS | Status: AC
  Administered 2014-02-02: 1000 mL via INTRAVENOUS

## 2014-02-02 MED ORDER — STERILE WATER FOR INJECTION IV SOLN
INTRAVENOUS | Status: DC
  Administered 2014-02-02: 17:00:00 via INTRAVENOUS
  Filled 2014-02-02 (×6): qty 850

## 2014-02-02 MED ORDER — MORPHINE SULFATE 2 MG/ML IJ SOLN: 2.0000 mg | INTRAMUSCULAR | Status: DC | PRN

## 2014-02-02 MED ORDER — VITAMIN B-1 100 MG PO TABS
100.0000 mg | ORAL_TABLET | Freq: Every day | ORAL | Status: DC
  Administered 2014-02-02 – 2014-02-04 (×3): 100 mg via ORAL
  Filled 2014-02-02 (×3): qty 1

## 2014-02-02 NOTE — ED Provider Notes (Signed)
CSN: [id number]     Arrival date & time 02/02/14  0530 History   First MD Initiated Contact with Patient 02/02/14 [phone]     Chief Complaint  Patient presents with  . Abdominal Pain     (Consider location/radiation/quality/duration/timing/severity/associated sxs/prior Treatment) HPI Patient presents to the emergency department with abdominal pain around his ostomy site.  The patient states that this started 2 days ago, he noted that he had redness around his ostomy site with some discomfort.  The patient also states that he's had a GI bug last week.  The patient states that he has had no vomiting, weakness, dizziness, fever, cough, runny nose, sore throat, headache, blurred vision, back pain, neck pain, dysuria, rash, chest pain, shortness of breath or syncope.  The patient states he did not take any medications prior to arrival.  Nothing seems to make his condition better or worse Past Medical History  Diagnosis Date  . Hypertension   . Diverticulosis   . Hemorrhoids   . Perirectal abscess   . Colitis   . History of shingles   . HIV (human immunodeficiency virus infection) 03/24/2012   Past Surgical History  Procedure Laterality Date  . Colonoscopy  08/31/2011    Procedure: COLONOSCOPY;  Surgeon: Hilarie FredricksonJohn N Perry, MD;  [facility];  Service: Endoscopy;  Laterality: N/A;  . Incision and drainage perirectal abscess  03/23/2012    Procedure: IRRIGATION AND DEBRIDEMENT PERIRECTAL ABSCESS;  Surgeon: Mariella SaaBenjamin T Hoxworth, MD;  Location: [facility] OR;  Service: General;  Laterality: N/A;  I & D soft tissue scrotal and perineal abscess  . Dressing change under anesthesia  03/27/2012    Procedure: DRESSING CHANGE UNDER ANESTHESIA;  Surgeon: Currie Parishristian J Streck, MD;  Location: [facility] OR;  Service: General;  Laterality: N/A;  . Laparoscopic diverted colostomy  03/27/2012    Procedure: LAPAROSCOPIC DIVERTED COLOSTOMY;  Surgeon: Currie Parishristian J Streck, MD;  [facility];  Service: General;  Laterality:  N/A;   Family History  Problem Relation Age of Onset  . Hypertension Mother   . Diabetes Mother    History  Substance Use Topics  . Smoking status: Light Tobacco Smoker -- 0.10 packs/day for 15 years    Types: Cigarettes  . Smokeless tobacco: Never Used     Comment: 1 cig a week  . Alcohol Use: Yes     Comment: rarely.    Review of Systems All other systems negative except as documented in the HPI. All pertinent positives and negatives as reviewed in the HPI.  Allergies  Review of patient's allergies indicates no known allergies.  Home Medications   Prior to Admission medications   Medication Sig Start Date End Date Taking? Authorizing Provider  abacavir-lamiVUDine (EPZICOM) 600-300 MG per tablet Take 1 tablet by mouth daily. 12/03/13  Yes Judyann Munsonynthia Snider, MD  dolutegravir (TIVICAY) 50 MG tablet Take 1 tablet (50 mg total) by mouth daily. 12/03/13  Yes Judyann Munsonynthia Snider, MD  hydrochlorothiazide (HYDRODIURIL) 25 MG tablet Take 1 tablet (25 mg total) by mouth daily. 01/01/14  Yes Judyann Munsonynthia Snider, MD  lisinopril (PRINIVIL,ZESTRIL) 40 MG tablet Take 1 tablet (40 mg total) by mouth daily. 01/01/14  Yes Judyann Munsonynthia Snider, MD   BP 98/60  Pulse 93  Temp(Src) 97.4 F (36.3 C) (Oral)  Ht 6\' 1"  (1.854 m)  Wt 225 lb (102.059 kg)  BMI 29.69 kg/m2  SpO2 94% Physical Exam  Nursing note and vitals reviewed. Constitutional: He is oriented to person, place, and time. He appears well-developed and  well-nourished. No distress.  HENT:  Head: Normocephalic and atraumatic.  Mouth/Throat: Oropharynx is clear and moist.  Eyes: Pupils are equal, round, and reactive to light.  Neck: Normal range of motion. Neck supple.  Cardiovascular: Normal rate, regular rhythm and normal heart sounds.  Exam reveals no gallop and no friction rub.   No murmur heard. Pulmonary/Chest: Effort normal and breath sounds normal. No respiratory distress.  Neurological: He is alert and oriented to person, place, and time. He  exhibits normal muscle tone. Coordination normal.  Skin: Skin is warm and dry.    ED Course  Procedures (including critical care time) Labs Review Labs Reviewed  CBC WITH DIFFERENTIAL - Abnormal; Notable for the following:    RDW 16.0 (*)    All other components within normal limits  COMPREHENSIVE METABOLIC PANEL  URINALYSIS, ROUTINE W REFLEX MICROSCOPIC    Patient will be admitted to the hospital for further evaluation and care.  Patient described the plan and all of his results.  All questions were answered  MDM   Final diagnoses:  Abdominal pain  Abdominal pain        Carlyle DollyChristopher W Airiana Elman, PA-C 02/02/14 1510

## 2014-02-02 NOTE — ED Notes (Signed)
Patients BP monitor was getting reading of 80's systolic. RN did manual BP and got a reading of 90/50. IV established and 1L NS started. Md notified.

## 2014-02-02 NOTE — ED Notes (Signed)
Patient transported to CT 

## 2014-02-02 NOTE — Progress Notes (Signed)
02/02/2014 10:49 AM  Pt arrived from ED via stretcher.  Pt is fully alert and oriented, ambulatory with standby assist.  On Call MD paged to notify of patient's arrival.  Pt c/o of abdominal pain, and is also requesting something to eat.  Skin intact.  Ostomy present to LLQ with fresh ostomy bag in place.  Vital signs currently being taken.  Pt denies any history of recent falls.  Lives at home.  Pt oriented to room/unit, and was instructed on how to utilize the call bell, to which he verbalized understanding.  Will continue to monitor. Theadora RamaKIRKMAN, Leon Nichols [place]

## 2014-02-02 NOTE — ED Notes (Signed)
NOTIFIED  C. LAWYER ,PA IN PERSON FOR PATIENTS LAB RESULTS OF CG4+ LACTIC ACID = 2.63mmol/L @10 :00 AM ,02/02/2014.

## 2014-02-02 NOTE — H&P (Signed)
Date: 02/02/2014               Patient Name:  Leon Nichols MRN: [id number]  DOB: 14-Jun-1965 Age / Sex: 48 y.o., male   PCP: Ky Barban, MD         Medical Service: Internal Medicine Teaching Service         Attending Physician: Dr. Earl Lagos, MD    First Contact: Dr. Farley Ly Pager: [phone]  Second Contact: Dr. Lauris Chroman Pager: [phone]       After Hours (After 5p/  First Contact Pager: [phone]  weekends / holidays): Second Contact Pager: [phone]   Chief Complaint: abdominal pain  History of Present Illness: Leon Nichols is a 47 year old man s/p diverted colostomy 2013 2/2 recurrent perirectal abscesses, HIV (RNA 142, CD4 500 in 12/2013), HTN presents with one week history of abdominal pain. He was in his usual state of health until ~1 week ago when he developed L-sided abdominal pain surrounding his ostomy site. He describes the pain as sharp and a 12/10. He has tried over the counter medications for it but is unclear on what he takes (he was confused between tylenol and advil). He also reports increased ostomy output that is liquid but very dark. He says he is now changing his bag ~ twice a day when normally it is once a day. No recent antibiotics. He has had the same appetite but much nausea and non-bloody non-bilious emesis. He thinks this is because he was sitting near someone who was coughing at a diner a week ago. He has a dry cough. He has decreased urine output and says it is very dark. He is also lightheaded when he stands up. He denies fever, chills, night sweats, chest pain, shortness of breath, dysuria, leg swelling, orthopnea, or weight change. He drank several beers and shots last night but only drinks once a week. He used cocaine one week ago but says he only uses it "recreationally."  He is followed by Dr. Drue Second for HIV he says was diagnosed in 2013. He says he has had issues with getting medications in the past and was recently restarted on his  anti-retrovirals in 11/2013. Most recent HIV RNA 142, CD4 500 in 12/2013. He has had no complications from his diverting colostomy performed by Dr.Streck in 2013 secondary to recurrent perirectal abscesses.  Meds: Current Facility-Administered Medications  Medication Dose Route Frequency Provider Last Rate Last Dose  . 0.9 %  sodium chloride infusion   Intravenous Continuous Courtney Paris, MD      . folic acid (FOLVITE) tablet 1 mg  1 mg Oral Daily Courtney Paris, MD      . heparin injection 5,000 Units  5,000 Units Subcutaneous 3 times per day Courtney Paris, MD      . HYDROcodone-acetaminophen (NORCO/VICODIN) 5-325 MG per tablet 1 tablet  1 tablet Oral Q6H PRN Lorenda Hatchet, MD   1 tablet at 02/02/14 1237  . LORazepam (ATIVAN) tablet 1 mg  1 mg Oral Q6H PRN Courtney Paris, MD       Or  . LORazepam (ATIVAN) injection 1 mg  1 mg Intravenous Q6H PRN Courtney Paris, MD      . multivitamin with minerals tablet 1 tablet  1 tablet Oral Daily Courtney Paris, MD      . ondansetron [facility]) tablet 4 mg  4 mg Oral Q6H PRN Courtney Paris, MD  Or  . ondansetron (ZOFRAN) injection 4 mg  4 mg Intravenous Q6H PRN Courtney Paris, MD      . thiamine (VITAMIN B-1) tablet 100 mg  100 mg Oral Daily Courtney Paris, MD       Or  . thiamine (B-1) injection 100 mg  100 mg Intravenous Daily Courtney Paris, MD        Allergies: Allergies as of 02/02/2014  . (No Known Allergies)   Past Medical History  Diagnosis Date  . Hypertension   . Diverticulosis   . Hemorrhoids   . Perirectal abscess   . Colitis   . History of shingles   . HIV (human immunodeficiency virus infection) 03/24/2012   Past Surgical History  Procedure Laterality Date  . Colonoscopy  08/31/2011    Procedure: COLONOSCOPY;  Surgeon: Hilarie Fredrickson, MD;  Location[facility] ENDOSCOPY;  Service: Endoscopy;  Laterality: N/A;  . Incision and drainage perirectal abscess  03/23/2012    Procedure: IRRIGATION AND DEBRIDEMENT PERIRECTAL ABSCESS;  Surgeon: Mariella Saa, MD;  Location: [facility] OR;  Service: General;  Laterality: N/A;  I & D soft tissue scrotal and perineal abscess  . Dressing change under anesthesia  03/27/2012    Procedure: DRESSING CHANGE UNDER ANESTHESIA;  Surgeon: Currie Paris, MD;  Location: [facility] OR;  Service: General;  Laterality: N/A;  . Laparoscopic diverted colostomy  03/27/2012    Procedure: LAPAROSCOPIC DIVERTED COLOSTOMY;  Surgeon: Currie Paris, MD;  Location: [facility] OR;  Service: General;  Laterality: N/A;   Family History  Problem Relation Age of Onset  . Hypertension Mother   . Diabetes Mother    History   Social History  . Marital Status: Single    Spouse Name: N/A    Number of Children: N/A  . Years of Education: N/A   Occupational History  . Not on file.   Social History Main Topics  . Smoking status: Light Tobacco Smoker -- 0.10 packs/day for 15 years    Types: Cigarettes  . Smokeless tobacco: Never Used     Comment: 1 cig a week  . Alcohol Use: Yes     Comment: rarely.  . Drug Use: No  . Sexual Activity: Not on file   Other Topics Concern  . Not on file   Social History Narrative  . No narrative on file    Review of Systems: A comprehensive review of systems was negative except for: as noted above her HPI  Physical Exam: Blood pressure 97/54, pulse 90, temperature 97.4 F (36.3 C), temperature source Oral, resp. rate 14, height 6\' 1"  (1.854 m), weight 225 lb (102.059 kg), SpO2 97.00%.  Gen: A&O x 4, no acute distress, well developed, well nourished HEENT: Atraumatic, PERRL, EOMI, sclerae anicteric, moist mucous membranes Heart: Regular rate and rhythm, normal S1 S2, no murmurs, rubs, or gallops Lungs: Clear to auscultation bilaterally, respirations unlabored, no CVA tenderness Abd: Soft, tender directly next to ostomy site on L midabdomen, stoma pink but erythematous,r, non-distended, + bowel sounds, no hepatosplenomegaly Ext: No edema or cyanosis  Lab results: Basic Metabolic  Panel:  Recent Labs  02/02/14 0625  NA 130*  K 4.4  CL 88*  CO2 16*  GLUCOSE 104*  BUN 125*  CREATININE 8.81*  CALCIUM 9.0   Liver Function Tests:  Recent Labs  02/02/14 0625  AST 13  ALT 8  ALKPHOS 68  BILITOT <0.2*  PROT 8.6*  ALBUMIN 3.7  CBC:  Recent Labs  02/02/14 0625  WBC 8.8  NEUTROABS 4.6  HGB 14.4  HCT 40.4  MCV 83.0  PLT 255   Urine Drug Screen: Drugs of Abuse     Component Value Date/Time   LABOPIA POSITIVE* 03/23/2012 0737   COCAINSCRNUR NONE DETECTED 03/23/2012 0737   LABBENZ NONE DETECTED 03/23/2012 0737   AMPHETMU NONE DETECTED 03/23/2012 0737   THCU NONE DETECTED 03/23/2012 0737   LABBARB NONE DETECTED 03/23/2012 0737    Urinalysis:  Recent Labs  02/02/14 0736  COLORURINE YELLOW  LABSPEC 1.022  PHURINE 5.0  GLUCOSEU NEGATIVE  HGBUR NEGATIVE  BILIRUBINUR NEGATIVE  KETONESUR 15*  PROTEINUR 100*  UROBILINOGEN 0.2  NITRITE NEGATIVE  LEUKOCYTESUR NEGATIVE  amorphous urates/phosphates  Misc. Labs: i-stat lactic acid 2.63  Imaging results:  Ct Abdomen Pelvis Wo Contrast  02/02/2014   CLINICAL DATA:  Redness around ostomy site.  EXAM: CT ABDOMEN AND PELVIS WITHOUT CONTRAST  TECHNIQUE: Multidetector CT imaging of the abdomen and pelvis was performed following the standard protocol without IV contrast.  COMPARISON:  03/23/2012.  08/29/2011.  FINDINGS: Left lower quadrant sigmoid loop colostomy is in place. There is extensive wall thickening of the descending colon traversing into the ostomy site. There is associated inflammatory changes in the adjacent fat as well as several mesenteric lymph nodes. None are greater than 10 mm in short axis diameter. Subjectively, there number has increased. Diverticuli are present in the area of wall thickening. No pneumatosis or extraluminal bowel gas.  Liver, gallbladder, spleen, pancreas, adrenal glands are within normal limits. Stable appearance of the kidneys. No hydronephrosis.  No abscess.  No  free-fluid  Bladder and prostate are unremarkable  Normal appendix.  Stable enlarged left inguinal lymph node with a short axis diameter of 13 mm.  L5-S1 degenerative disc disease and left foraminal stenosis.  IMPRESSION: Left lower quadrant sigmoid loop colostomy has been performed. There is marked wall thickening of the entering descending colon with inflammatory change and innumerable small lymph nodes. Findings may represent an inflammatory process. Malignancy can have a similar appearance. Gastroenterology consultation recommended.   Electronically Signed   By: Maryclare Bean M.D.   On: 02/02/2014 09:42    Other results: EKG: pending  Assessment & Plan by Problem: Active Problems:   Colitis   Acute renal failure  #Gastroenteritis v Colitis: Leon Nichols has marked wall thickening of the entire descending colon w inflammatory change consistent with colitis. This fits with increased ostomy output x 1 week and pain/tenderness at site. There is no fever, tachycardia, tachypnea, or leukocytosis so he does not meet SIRS criteria. This is likely viral gasteroenteritis so antibiotics are not warranted at this time. We will give supportive management at this time and if his condition worsens we would consider antibiotics such as cipro/flagyl. -vicodin 5-325 q6hprn -ondansetron 4 mg po or iv q6hprn -stool culture -NS 125 cc/hr -cont to monitor -f/u EKG  #AKI: Leon Nichols creatinine on presentation is 8.8 which is increased from 0.9 one month ago. His BUN is also increased at 125 from 11 one month ago. He also reports decreased urine output and a dark appearance to the urine. He says he has never had kidney issues in the past. This may be due to dehydration in the setting of a gastroenteritis/colitis. He has had increased ostomy output but did not appear dehydrated on exam.This may be due to restarting anti-retroviral medications. While he describes it as dark appearance, hemoglobin 14.4. -renal  consult -renal ultrasound -hold anti-retrovirals for now -check  orthostatics -check urine sodium, creatinine -NS 125 cc/hr -hold urine for spin down and microscopy -hold HCTZ, lisinopril -Is and Os, daily weights  #Substance Abuse: Leon Nichols says he last drank several beers and shots last night but only drinks about once a week. He also reports cocaine use a week ago. -CIWA protocol -urine rapid drug screen  #HIV: He is followed by Dr. Drue Second for HIV he says was diagnosed in 2013. He says he has had issues with getting medications in the past and was recently restarted on his anti-retrovirals in 11/2013. Most recent HIV RNA 142, CD4 500 in 12/2013. He normally takes abacavir-lamivudine 600-300 mg daily and dolutegravir 50 mg daily. -hold anti-retrovirals for now as they may crystallize and cause/contribute to AKI  #HTN: Leon Nichols is on HCTZ 25 daily and lisinopril 40 mg daily at home. His BP has been soft (90s/50s) so we will hold for now. There is also concern that he may be dehydrated which is causing AKI -check orthostatics -hold HCTZ 25mg  daily and lisinopril 40 mg daily for now -cont to monitor  #Diet: clear liquid  #DVT ppx: heparin 5000 u [place] q8h  Dispo: Disposition is deferred at this time, awaiting improvement of current medical problems. Anticipated discharge in approximately 1-2 day(s).   The patient does have a current PCP (Ky Barban, MD) and does need an [facility] hospital follow-up appointment after discharge.  The patient does not know have transportation limitations that hinder transportation to clinic appointments.  Signed: Lorenda Hatchet, MD 02/02/2014, 12:39 PM

## 2014-02-02 NOTE — ED Notes (Signed)
Pt. Has a colostomy and noticed some "black tar like stuff" in ostomy bag. C/o pain at ostomy site. Pt. Has had ostomy for 2 years without problems.

## 2014-02-02 NOTE — ED Notes (Addendum)
Left lower quadrant ostomy site cleaned and bag changed. Redness noted on stoma and surrounding area. Pt. States that this is an abnormal finding for his ostomy site. Black stool noted in bag and in stoma.

## 2014-02-02 NOTE — Consult Note (Signed)
Reason for Consult:  Referring Physician: Dr. Valentino Saxon  HPI:  Leon Nichols is an 48 y.o. male with PMH HIV, HTN, postherpetic neuralgia (V3), hx colitis s/p colostomy. Admitted 02/02/2014 for abdominal pain and black tarry drainage from ostomy. He has a history of AKI in 03/2012 thought to be due to nephrotoxic medications (vanc, truvada, bactrim), however no known baseline CKD and his Cr 1 month ago was 0.89. He presents to the ED today with Cr of 8.8, for which nephrology has been consulted. Of note he restarted ARV beginning of September (abacavir-lamivudine, dolutegravir). He is also on HCTZ, lisinopril. He reports over the past 1-2 months taking 200-400mg  ibuprofen each night to help him fall asleep. He also says he has had decreased urine output over the past 1 week since he started having a colitis (reports normal ostomy output changing bags 1-2 times a day, past week has been 6-8 bags). He currently feels a little improved but is requesting pain medications for his abdominal pain.   Trend in Creatinine: Creat  Date/Time Value Ref Range Status  01/01/2014 10:29 AM 0.89  0.50 - 1.35 mg/dL Final  04/10/1094 04:54 AM 0.95  0.50 - 1.35 mg/dL Final  0/98/1191  4:78 AM 0.94  0.50 - 1.35 mg/dL Final  2/95/6213 08:65 AM 0.79  0.50 - 1.35 mg/dL Final  10/11/4694  2:95 PM 0.84  0.50 - 1.35 mg/dL Final     Creatinine, Ser  Date/Time Value Ref Range Status  02/02/2014  6:25 AM 8.81* 0.50 - 1.35 mg/dL Final  28/41/3244  0:10 AM 1.98* 0.50 - 1.35 mg/dL Final  27/25/3664  4:03 AM 2.29* 0.50 - 1.35 mg/dL Final  47/42/5956  3:87 AM 2.44* 0.50 - 1.35 mg/dL Final  56/43/3295  1:88 AM 2.51* 0.50 - 1.35 mg/dL Final  41/66/0630 16:01 PM 2.43* 0.50 - 1.35 mg/dL Final  09/32/3557  3:22 AM 2.40* 0.50 - 1.35 mg/dL Final  02/54/2706  2:37 PM 2.27* 0.50 - 1.35 mg/dL Final  62/83/1517  6:16 PM 2.15* 0.50 - 1.35 mg/dL Final  07/37/1062  6:94 AM 1.87* 0.50 - 1.35 mg/dL Final     DELTA CHECK NOTED  03/28/2012  5:00  AM 0.57  0.50 - 1.35 mg/dL Final  85/46/2703  5:00 AM 0.64  0.50 - 1.35 mg/dL Final  93/81/8299  3:71 AM 0.72  0.50 - 1.35 mg/dL Final  69/67/8938  1:01 AM 0.85  0.50 - 1.35 mg/dL Final  75/01/2584 27:78 AM 0.82  0.50 - 1.35 mg/dL Final  24/23/5361 44:31 AM 0.85  0.50 - 1.35 mg/dL Final  54/00/8676  1:95 PM 1.21  0.50 - 1.35 mg/dL Final  0/93/2671  2:45 AM 0.95  0.50 - 1.35 mg/dL Final  11/12/9831  8:25 AM 0.93  0.50 - 1.35 mg/dL Final  0/53/9767 34:19 PM 0.89  0.50 - 1.35 mg/dL Final  3/79/0240  9:73 AM 0.86  0.50 - 1.35 mg/dL Final  5/32/9924  2:68 AM 0.79  0.50 - 1.35 mg/dL Final     DELTA CHECK NOTED  08/29/2011  7:43 AM 1.19  0.50 - 1.35 mg/dL Final  06/06/1960  2:29 AM 1.2  0.4 - 1.5 mg/dL Final  79/89/2119  4:17 PM 0.90  0.40-1.50 mg/dL Final    PMH:   Past Medical History  Diagnosis Date  . Hypertension   . Diverticulosis   . Hemorrhoids   . Perirectal abscess   . Colitis   . History of shingles   . HIV (human immunodeficiency virus infection) 03/24/2012  PSH:   Past Surgical History  Procedure Laterality Date  . Colonoscopy  08/31/2011    Procedure: COLONOSCOPY;  Surgeon: Hilarie FredricksonJohn N Perry, MD;  [facility] ENDOSCOPY;  Service: Endoscopy;  Laterality: N/A;  . Incision and drainage perirectal abscess  03/23/2012    Procedure: IRRIGATION AND DEBRIDEMENT PERIRECTAL ABSCESS;  Surgeon: Mariella SaaBenjamin T Hoxworth, MD;  Location: [facility] OR;  Service: General;  Laterality: N/A;  I & D soft tissue scrotal and perineal abscess  . Dressing change under anesthesia  03/27/2012    Procedure: DRESSING CHANGE UNDER ANESTHESIA;  Surgeon: Currie Parishristian J Streck, MD;  Location: [facility] OR;  Service: General;  Laterality: N/A;  . Laparoscopic diverted colostomy  03/27/2012    Procedure: LAPAROSCOPIC DIVERTED COLOSTOMY;  Surgeon: Currie Parishristian J Streck, MD;  Location: [facility] OR;  Service: General;  Laterality: N/A;    Allergies: No Known Allergies  Medications:   Prior to Admission medications   Medication Sig Start  Date End Date Taking? Authorizing Provider  abacavir-lamiVUDine (EPZICOM) 600-300 MG per tablet Take 1 tablet by mouth daily. 12/03/13  Yes Judyann Munsonynthia Snider, MD  dolutegravir (TIVICAY) 50 MG tablet Take 1 tablet (50 mg total) by mouth daily. 12/03/13  Yes Judyann Munsonynthia Snider, MD  hydrochlorothiazide (HYDRODIURIL) 25 MG tablet Take 1 tablet (25 mg total) by mouth daily. 01/01/14  Yes Judyann Munsonynthia Snider, MD  lisinopril (PRINIVIL,ZESTRIL) 40 MG tablet Take 1 tablet (40 mg total) by mouth daily. 01/01/14  Yes Judyann Munsonynthia Snider, MD    Inpatient medications: . heparin  5,000 Units Subcutaneous 3 times per day    Discontinued Meds:   Medications Discontinued During This Encounter  Medication Reason  . Antipyrine-Benzocaine (AURALGAN) 54-14 MG/ML SOLN Inpatient Standard  . chlorhexidine (PERIDEX) 0.12 % solution Inpatient Standard  . HYDROcodone-acetaminophen (NORCO/VICODIN) 5-325 MG per tablet Inpatient Standard  . iohexol (OMNIPAQUE) 300 MG/ML solution 25 mL     Social History:  reports that he has been smoking Cigarettes.  He has a 1.5 pack-year smoking history. He has never used smokeless tobacco. He reports that he drinks alcohol. He reports that he does not use illicit drugs.  Family History:   Family History  Problem Relation Age of Onset  . Hypertension Mother   . Diabetes Mother     A comprehensive review of systems was negative except for: Gastrointestinal: positive for abdominal pain Genitourinary: positive for decreased urine output Weight change:  No intake or output data in the 24 hours ending 02/02/14 1131 BP 97/54  Pulse 90  Temp(Src) 97.4 F (36.3 C) (Oral)  Resp 14  Ht 6\' 1"  (1.854 m)  Wt 225 lb (102.059 kg)  BMI 29.69 kg/m2  SpO2 97% Filed Vitals:   02/02/14 0830 02/02/14 0900 02/02/14 0945 02/02/14 1015  BP: 116/68 118/71 99/59 97/54   Pulse: 91 96 92 90  Temp:      TempSrc:      Resp:    14  Height:      Weight:      SpO2: 98% 96% 96% 97%     General: NAD, AA male  appears stated age HEENT: PERRL, EOMI, scleral anicteric, MMM CV: RRR, normal s1 and s2, no m/r/g Resp: CTAB, normal effort Abdomen: soft, mildly tender diffusely, ostomy bag clear (left middle abd), +BS Ext: no edema, no cyanosis. Neuro: alert and oriented, grossly normal  Labs: Basic Metabolic Panel:  Recent Labs Lab 02/02/14 0625  NA 130*  K 4.4  CL 88*  CO2 16*  GLUCOSE 104*  BUN 125*  CREATININE  8.81*  ALBUMIN 3.7  CALCIUM 9.0   Liver Function Tests:  Recent Labs Lab 02/02/14 0625  AST 13  ALT 8  ALKPHOS 68  BILITOT <0.2*  PROT 8.6*  ALBUMIN 3.7   No results found for this basename: LIPASE, AMYLASE,  in the last 168 hours No results found for this basename: AMMONIA,  in the last 168 hours CBC:  Recent Labs Lab 02/02/14 0625  WBC 8.8  NEUTROABS 4.6  HGB 14.4  HCT 40.4  MCV 83.0  PLT 255   PT/INR: @LABRCNTIP (inr:5) Cardiac Enzymes: )No results found for this basename: CKTOTAL, CKMB, CKMBINDEX, TROPONINI,  in the last 168 hours CBG: No results found for this basename: GLUCAP,  in the last 168 hours  Iron Studies: No results found for this basename: IRON, TIBC, TRANSFERRIN, FERRITIN,  in the last 168 hours  Xrays/Other Studies: Ct Abdomen Pelvis Wo Contrast  02/02/2014   CLINICAL DATA:  Redness around ostomy site.  EXAM: CT ABDOMEN AND PELVIS WITHOUT CONTRAST  TECHNIQUE: Multidetector CT imaging of the abdomen and pelvis was performed following the standard protocol without IV contrast.  COMPARISON:  03/23/2012.  08/29/2011.  FINDINGS: Left lower quadrant sigmoid loop colostomy is in place. There is extensive wall thickening of the descending colon traversing into the ostomy site. There is associated inflammatory changes in the adjacent fat as well as several mesenteric lymph nodes. None are greater than 10 mm in short axis diameter. Subjectively, there number has increased. Diverticuli are present in the area of wall thickening. No pneumatosis or  extraluminal bowel gas.  Liver, gallbladder, spleen, pancreas, adrenal glands are within normal limits. Stable appearance of the kidneys. No hydronephrosis.  No abscess.  No free-fluid  Bladder and prostate are unremarkable  Normal appendix.  Stable enlarged left inguinal lymph node with a short axis diameter of 13 mm.  L5-S1 degenerative disc disease and left foraminal stenosis.  IMPRESSION: Left lower quadrant sigmoid loop colostomy has been performed. There is marked wall thickening of the entering descending colon with inflammatory change and innumerable small lymph nodes. Findings may represent an inflammatory process. Malignancy can have a similar appearance. Gastroenterology consultation recommended.   Electronically Signed   By: Maryclare Bean M.D.   On: 02/02/2014 09:42     Assessment/Plan: 1. AKI: suspect likely drug induced in setting of hypovolemia/hypotension from increased ostomy output x 1 week. With ACEI in system preventing appropriate autoregulation of RBF. Multiple nephrotoxic meds including abacavir, lamivudine, hctz, lisinopril, ibuprofen. He does not appear uremic, and still producing urine. Labwork from 1 month ago showed an eosinophilia, which could be consistent with AIN (abacavir known to do this) but diff today shows this has resolved. He is a little hemoconcentrated supporting his low volume status. His urine is significant only for ketones and protein.  UDS, urine sodium/cr pending. Would recommend treating him with normal sodium bicarb 125 cc/hr to help with acidemia below as well as repleting vol.  2. Acidemia: combination decreased volume status, also possible RTA from lamivudine. 3. HIV: hold ARV for now while kidney function and volume improves, can put him on trial again in a few days. Abacavir most common offending agent of those he is on. 4. HTN: hold hctz/lisinopril as he is hypotensive. Check orthostatics q shift. 5. Postherpetic neuralgia 6. Colitis/Colostomy: increased  output/colitis seen on CT  Tawni Carnes 02/02/2014, 11:31 AM I have seen and examined this patient and agree with the plan of care seen, examined,eval, discussed with resident, and patient.  Marland Kitchen  Ferrell Claiborne L 02/02/2014, 2:31 PM

## 2014-02-03 DIAGNOSIS — F1910 Other psychoactive substance abuse, uncomplicated: Secondary | ICD-10-CM

## 2014-02-03 DIAGNOSIS — A084 Viral intestinal infection, unspecified: Principal | ICD-10-CM

## 2014-02-03 DIAGNOSIS — I10 Essential (primary) hypertension: Secondary | ICD-10-CM

## 2014-02-03 DIAGNOSIS — I951 Orthostatic hypotension: Secondary | ICD-10-CM

## 2014-02-03 DIAGNOSIS — Z21 Asymptomatic human immunodeficiency virus [HIV] infection status: Secondary | ICD-10-CM

## 2014-02-03 LAB — CBC
HCT: 41 % (ref 39.0–52.0)
Hemoglobin: 14.4 g/dL (ref 13.0–17.0)
MCH: 28.9 pg (ref 26.0–34.0)
MCHC: 35.1 g/dL (ref 30.0–36.0)
MCV: 82.3 fL (ref 78.0–100.0)
PLATELETS: 270 10*3/uL (ref 150–400)
RBC: 4.98 MIL/uL (ref 4.22–5.81)
RDW: 15.9 % — AB (ref 11.5–15.5)
WBC: 4.5 10*3/uL (ref 4.0–10.5)

## 2014-02-03 LAB — RENAL FUNCTION PANEL
ALBUMIN: 3.7 g/dL (ref 3.5–5.2)
Anion gap: 16 — ABNORMAL HIGH (ref 5–15)
BUN: 88 mg/dL — ABNORMAL HIGH (ref 6–23)
CALCIUM: 9.8 mg/dL (ref 8.4–10.5)
CO2: 21 mEq/L (ref 19–32)
Chloride: 94 mEq/L — ABNORMAL LOW (ref 96–112)
Creatinine, Ser: 2.03 mg/dL — ABNORMAL HIGH (ref 0.50–1.35)
GFR calc non Af Amer: 38 mL/min — ABNORMAL LOW (ref >90)
GFR, EST AFRICAN AMERICAN: 44 mL/min — AB (ref >90)
Glucose, Bld: 98 mg/dL (ref 70–99)
PHOSPHORUS: 3.3 mg/dL (ref 2.3–4.6)
Potassium: 4.2 mEq/L (ref 3.7–5.3)
SODIUM: 131 meq/L — AB (ref 137–147)

## 2014-02-03 MED ORDER — SODIUM CHLORIDE 0.9 % IV SOLN
INTRAVENOUS | Status: AC
  Administered 2014-02-03: 13:00:00 via INTRAVENOUS

## 2014-02-03 MED ORDER — CARBAMIDE PEROXIDE 6.5 % OT SOLN
5.0000 [drp] | Freq: Two times a day (BID) | OTIC | Status: DC
  Administered 2014-02-03 – 2014-02-04 (×2): 5 [drp] via OTIC
  Filled 2014-02-03 (×2): qty 15

## 2014-02-03 NOTE — Progress Notes (Signed)
Subjective: Interval History: has no complaint, only emptied colostomy 2x yest.  Objective: Vital signs in last 24 hours: Temp:  [98 F (36.7 C)-98.4 F (36.9 C)] 98.4 F (36.9 C) (11/01 0514) Pulse Rate:  [78-105] 103 (11/01 1015) Resp:  [14-18] 17 (11/01 0514) BP: (103-134)/(55-83) 134/81 mmHg (11/01 1015) SpO2:  [97 %-98 %] 98 % (11/01 0514) Weight:  [101.78 kg (224 lb 6.2 oz)] 101.78 kg (224 lb 6.2 oz) (10/31 2058) Weight change: -0.279 kg (-9.9 oz)  Intake/Output from previous day: 10/31 0701 - 11/01 0700 In: 2308.8 [P.O.:240; I.V.:1568.8] Out: 4450 [Urine:4450] Intake/Output this shift: Total I/O In: -  Out: 250 [Urine:250]  General appearance: alert, cooperative and no distress Resp: clear to auscultation bilaterally Cardio: S1, S2 normal and systolic murmur: systolic ejection 2/6, decrescendo at 2nd left intercostal space GI: ostomy LU abdm, pos bs,soft Extremities: extremities normal, atraumatic, no cyanosis or edema  Lab Results:  Recent Labs  02/02/14 0625 02/03/14 0458  WBC 8.8 4.5  HGB 14.4 14.4  HCT 40.4 41.0  PLT 255 270   BMET:  Recent Labs  02/02/14 0625 02/03/14 0458  NA 130* 131*  K 4.4 4.2  CL 88* 94*  CO2 16* 21  GLUCOSE 104* 98  BUN 125* 88*  CREATININE 8.81* 2.03*  CALCIUM 9.0 9.8   No results for input(s): PTH in the last 72 hours. Iron Studies: No results for input(s): IRON, TIBC, TRANSFERRIN, FERRITIN in the last 72 hours.  Studies/Results: Ct Abdomen Pelvis Wo Contrast  02/02/2014   CLINICAL DATA:  Redness around ostomy site.  EXAM: CT ABDOMEN AND PELVIS WITHOUT CONTRAST  TECHNIQUE: Multidetector CT imaging of the abdomen and pelvis was performed following the standard protocol without IV contrast.  COMPARISON:  03/23/2012.  08/29/2011.  FINDINGS: Left lower quadrant sigmoid loop colostomy is in place. There is extensive wall thickening of the descending colon traversing into the ostomy site. There is associated inflammatory  changes in the adjacent fat as well as several mesenteric lymph nodes. None are greater than 10 mm in short axis diameter. Subjectively, there number has increased. Diverticuli are present in the area of wall thickening. No pneumatosis or extraluminal bowel gas.  Liver, gallbladder, spleen, pancreas, adrenal glands are within normal limits. Stable appearance of the kidneys. No hydronephrosis.  No abscess.  No free-fluid  Bladder and prostate are unremarkable  Normal appendix.  Stable enlarged left inguinal lymph node with a short axis diameter of 13 mm.  L5-S1 degenerative disc disease and left foraminal stenosis.  IMPRESSION: Left lower quadrant sigmoid loop colostomy has been performed. There is marked wall thickening of the entering descending colon with inflammatory change and innumerable small lymph nodes. Findings may represent an inflammatory process. Malignancy can have a similar appearance. Gastroenterology consultation recommended.   Electronically Signed   By: Maryclare BeanArt  Hoss M.D.   On: 02/02/2014 09:42   [place] Renal  02/02/2014   CLINICAL DATA:  Acute renal failure.  Hypertension.  HIV.  EXAM: RENAL/URINARY TRACT ULTRASOUND COMPLETE  COMPARISON:  03/29/2012  FINDINGS: Right Kidney:  Length: 12.8 cm. Mildly increased renal parenchymal echogenicity. No mass or hydronephrosis visualized.  Left Kidney:  Length: 12.7 cm. Mildly increased renal parenchymal echogenicity. No mass or hydronephrosis visualized.  Bladder:  Appears normal for degree of bladder distention.  IMPRESSION: Increased renal parenchymal echogenicity, consistent with medical renal disease. No evidence of hydronephrosis.   Electronically Signed   By: Myles RosenthalJohn  Stahl M.D.   On: 02/02/2014 16:38    I  have reviewed the patient's current medications.  Assessment/Plan: 1 AKI rapid resolution.  Vol decrease in setting of ACEi most likely.   2 HIV  Kidneys echodense so needs to be watched.  If HTN , in 2-3 wk restart ACEI and educate to hold in dehyd  illness REC  Change to NS 75 cc/h, follow chem , as above with meds.    LOS: 1 day   Flara Storti L 02/03/2014,11:23 AM

## 2014-02-03 NOTE — Progress Notes (Signed)
Subjective:  Patient was seen and examined this morning. Patient states that his abdominal pain around his ostomy site has improved to a 4/10. He continuous to have loose stools with less output than before, but still had to change it twice yesterday. He denies any fever, chills, chest pain, shortness of breath, nausea, or vomiting. Patient states he is hungry.  Objective: Vital signs in last 24 hours: Filed Vitals:   02/03/14 0514 02/03/14 1012 02/03/14 1014 02/03/14 1015  BP: 121/70 121/83 119/77 134/81  Pulse: 78 105 98 103  Temp: 98.4 F (36.9 C)     TempSrc: Oral     Resp: 17     Height:      Weight:      SpO2: 98%      Weight change: -0.279 kg (-9.9 oz)  Intake/Output Summary (Last 24 hours) at 02/03/14 1113 Last data filed at 02/03/14 0807  Gross per 24 hour  Intake 2308.75 ml  Output   4700 ml  Net -2391.25 ml   General: Vital signs reviewed.  Patient is well-developed and well-nourished, in no acute distress and cooperative with exam.  Cardiovascular: RRR, S1 normal, S2 normal, no murmurs, gallops, or rubs. Pulmonary/Chest: Clear to auscultation bilaterally, no wheezes, rales, or rhonchi. Abdominal: Soft, non-tender, non-distended, BS +, no masses, organomegaly, or guarding present. Ostomy bag has a moderate amount of dark, loose stool content.  Musculoskeletal: No joint deformities, erythema, or stiffness, ROM full and nontender. Extremities: No lower extremity edema bilaterally, pulses symmetric and intact bilaterally. No cyanosis or clubbing. Skin: Warm, dry and intact. No rashes or erythema. Psychiatric: Normal mood and affect. speech and behavior is normal. Cognition and memory are normal.   Lab Results: Basic Metabolic Panel:  Recent Labs Lab 02/02/14 0625 02/03/14 0458  NA 130* 131*  K 4.4 4.2  CL 88* 94*  CO2 16* 21  GLUCOSE 104* 98  BUN 125* 88*  CREATININE 8.81* 2.03*  CALCIUM 9.0 9.8  PHOS  --  3.3   Liver Function Tests:  Recent  Labs Lab 02/02/14 0625 02/03/14 0458  AST 13  --   ALT 8  --   ALKPHOS 68  --   BILITOT <0.2*  --   PROT 8.6*  --   ALBUMIN 3.7 3.7   CBC:  Recent Labs Lab 02/02/14 0625 02/03/14 0458  WBC 8.8 4.5  NEUTROABS 4.6  --   HGB 14.4 14.4  HCT 40.4 41.0  MCV 83.0 82.3  PLT 255 270   Urine Drug Screen: Drugs of Abuse     Component Value Date/Time   LABOPIA NONE DETECTED 02/02/2014 1345   COCAINSCRNUR POSITIVE* 02/02/2014 1345   LABBENZ NONE DETECTED 02/02/2014 1345   AMPHETMU NONE DETECTED 02/02/2014 1345   THCU NONE DETECTED 02/02/2014 1345   LABBARB NONE DETECTED 02/02/2014 1345    Urinalysis:  Recent Labs Lab 02/02/14 0736  COLORURINE YELLOW  LABSPEC 1.022  PHURINE 5.0  GLUCOSEU NEGATIVE  HGBUR NEGATIVE  BILIRUBINUR NEGATIVE  KETONESUR 15*  PROTEINUR 100*  UROBILINOGEN 0.2  NITRITE NEGATIVE  LEUKOCYTESUR NEGATIVE   Studies/Results: Ct Abdomen Pelvis Wo Contrast  02/02/2014   CLINICAL DATA:  Redness around ostomy site.  EXAM: CT ABDOMEN AND PELVIS WITHOUT CONTRAST  TECHNIQUE: Multidetector CT imaging of the abdomen and pelvis was performed following the standard protocol without IV contrast.  COMPARISON:  03/23/2012.  08/29/2011.  FINDINGS: Left lower quadrant sigmoid loop colostomy is in place. There is extensive wall thickening of the descending colon  traversing into the ostomy site. There is associated inflammatory changes in the adjacent fat as well as several mesenteric lymph nodes. None are greater than 10 mm in short axis diameter. Subjectively, there number has increased. Diverticuli are present in the area of wall thickening. No pneumatosis or extraluminal bowel gas.  Liver, gallbladder, spleen, pancreas, adrenal glands are within normal limits. Stable appearance of the kidneys. No hydronephrosis.  No abscess.  No free-fluid  Bladder and prostate are unremarkable  Normal appendix.  Stable enlarged left inguinal lymph node with a short axis diameter of 13 mm.   L5-S1 degenerative disc disease and left foraminal stenosis.  IMPRESSION: Left lower quadrant sigmoid loop colostomy has been performed. There is marked wall thickening of the entering descending colon with inflammatory change and innumerable small lymph nodes. Findings may represent an inflammatory process. Malignancy can have a similar appearance. Gastroenterology consultation recommended.   Electronically Signed   By: Maryclare BeanArt  Hoss M.D.   On: 02/02/2014 09:42   [place] Renal  02/02/2014   CLINICAL DATA:  Acute renal failure.  Hypertension.  HIV.  EXAM: RENAL/URINARY TRACT ULTRASOUND COMPLETE  COMPARISON:  03/29/2012  FINDINGS: Right Kidney:  Length: 12.8 cm. Mildly increased renal parenchymal echogenicity. No mass or hydronephrosis visualized.  Left Kidney:  Length: 12.7 cm. Mildly increased renal parenchymal echogenicity. No mass or hydronephrosis visualized.  Bladder:  Appears normal for degree of bladder distention.  IMPRESSION: Increased renal parenchymal echogenicity, consistent with medical renal disease. No evidence of hydronephrosis.   Electronically Signed   By: Myles RosenthalJohn  Stahl M.D.   On: 02/02/2014 16:38   Medications:  I have reviewed the patient's current medications. Prior to Admission:  Prescriptions prior to admission  Medication Sig Dispense Refill Last Dose  . abacavir-lamiVUDine (EPZICOM) 600-300 MG per tablet Take 1 tablet by mouth daily. 30 tablet 11 02/01/2014 at Unknown time  . dolutegravir (TIVICAY) 50 MG tablet Take 1 tablet (50 mg total) by mouth daily. 30 tablet 11 02/01/2014 at Unknown time  . hydrochlorothiazide (HYDRODIURIL) 25 MG tablet Take 1 tablet (25 mg total) by mouth daily. 30 tablet 11 02/01/2014 at Unknown time  . lisinopril (PRINIVIL,ZESTRIL) 40 MG tablet Take 1 tablet (40 mg total) by mouth daily. 30 tablet 11 02/01/2014 at Unknown time   Scheduled Meds: . folic acid  1 mg Oral Daily  . heparin  5,000 Units Subcutaneous 3 times per day  . multivitamin with minerals  1  tablet Oral Daily  . thiamine  100 mg Oral Daily   Or  . thiamine  100 mg Intravenous Daily   Continuous Infusions: .  sodium bicarbonate 150 mEq in sterile water 1000 mL infusion 125 mL/hr at 02/02/14 1727   PRN Meds:.HYDROcodone-acetaminophen, LORazepam **OR** LORazepam, ondansetron **OR** ondansetron (ZOFRAN) IV Assessment/Plan: Active Problems:   Colitis   Acute renal failure  Viral Gastroenteritis: CT abdomen pelvis showed marked wall thickening of the entire descending colon with inflammatory change consistent with colitis. Patient continues to have increased loose stool output, but improving. Afebrile, no leukocytosis. Pain decreased to 4/10. No more nausea or vomiting. Improving with supportive management. -Vicodin 5-325 q6hprn -Ondansetron 4 mg po or IV Q6H prn -Stool culture pending -NS 75 cc/hr -Regular Diet -I/Os  AKI: Improving. Cr 8.8>2.03, which is increased from 0.9 one month ago. [id number]>88 in the setting of nausea, vomiting and decreased urine output. FENa 3.3, indicating ATN. Renal ultrasound showed echodense kidneys. AKI could be secondary to anti-retroviral therapy, but most likely secondary to hypovolemia in  the setting of ACEI. Nephrology recommends holding ACEI for 2-3 weeks and changing fluids to NS 75 cc/hr. -Nephrology following -Continue to hold anti-retrovirals, consider restarting tomorrow -NS 75 cc/hr -Hold HCTZ, lisinopril -Restart lisinopril in 2-3 weeks and hold in dehydration -I/Os, daily weights -Renal Function panel in am  Substance Abuse: UDS positive for cocaine. Patient has not required any ativan while on CIWA protocol. -Monitor  HIV: Patient followed by Dr. Drue Second. Most recent HIV RNA 142, CD4 500 in 12/2013.On abacavir-lamivudine 600-300 mg daily and dolutegravir 50 mg daily at home. Anti-retrovirals could be a contributing factor to AKI. -Continue to hold anti-retrovirals   Orthostatic Hypotension in the setting of Chronic HTN: BP  119/77-134/81 over night. Orthostatics yesterday showed 126/74 sitting and 103/62 standing. HR 94>107. On HCTZ 25 daily and lisinopril 40 mg daily at home. These medications in addition to dehydration were likely contributing to his AKI and hypovolemia. -Hold HCTZ 25mg  daily and lisinopril 40 mg daily for now  DVT ppx: Heparin 5000 u SQ Q8H  Dispo: Disposition is deferred at this time, awaiting improvement of current medical problems.  Anticipated discharge in approximately 1-2 day(s).   The patient does have a current PCP (Ky Barban, MD) and does need an [facility] hospital follow-up appointment after discharge.  The patient does not have transportation limitations that hinder transportation to clinic appointments.  .Services Needed at time of discharge: Y = Yes, Blank = No PT:   OT:   RN:   Equipment:   Other:     LOS: 1 day   Jill Alexanders, DO PGY-1 Internal Medicine Resident Pager # [phone] 02/03/2014 11:13 AM

## 2014-02-03 NOTE — Progress Notes (Signed)
Utilization review completed.  

## 2014-02-04 DIAGNOSIS — K9419 Other complications of enterostomy: Secondary | ICD-10-CM

## 2014-02-04 LAB — RENAL FUNCTION PANEL
Albumin: 3.5 g/dL (ref 3.5–5.2)
Anion gap: 13 (ref 5–15)
BUN: 48 mg/dL — ABNORMAL HIGH (ref 6–23)
CO2: 27 meq/L (ref 19–32)
CREATININE: 0.97 mg/dL (ref 0.50–1.35)
Calcium: 9.8 mg/dL (ref 8.4–10.5)
Chloride: 97 mEq/L (ref 96–112)
GFR calc Af Amer: >90 mL/min (ref >90)
GFR calc non Af Amer: >90 mL/min (ref >90)
Glucose, Bld: 116 mg/dL — ABNORMAL HIGH (ref 70–99)
PHOSPHORUS: 2.4 mg/dL (ref 2.3–4.6)
Potassium: 4.3 mEq/L (ref 3.7–5.3)
Sodium: 137 mEq/L (ref 137–147)

## 2014-02-04 MED ORDER — LAMIVUDINE 150 MG PO TABS
300.0000 mg | ORAL_TABLET | Freq: Every day | ORAL | Status: DC
  Administered 2014-02-04: 300 mg via ORAL
  Filled 2014-02-04: qty 2

## 2014-02-04 MED ORDER — OSTOMY SUPPLIES POUCH MISC: 1.0000 | Freq: Every day | Status: DC

## 2014-02-04 MED ORDER — ABACAVIR SULFATE-LAMIVUDINE 600-300 MG PO TABS
1.0000 | ORAL_TABLET | Freq: Every day | ORAL | Status: DC
  Filled 2014-02-04 (×2): qty 1

## 2014-02-04 MED ORDER — ABACAVIR SULFATE 300 MG PO TABS
600.0000 mg | ORAL_TABLET | Freq: Every day | ORAL | Status: DC
  Administered 2014-02-04: 600 mg via ORAL
  Filled 2014-02-04: qty 2

## 2014-02-04 MED ORDER — DOLUTEGRAVIR SODIUM 50 MG PO TABS
50.0000 mg | ORAL_TABLET | Freq: Every day | ORAL | Status: DC
  Administered 2014-02-04: 50 mg via ORAL
  Filled 2014-02-04 (×2): qty 1

## 2014-02-04 NOTE — Plan of Care (Signed)
Problem: Phase I Progression Outcomes Goal: Pain controlled with appropriate interventions Outcome: Completed/Met Date Met:  02/04/14 Goal: OOB as tolerated unless otherwise ordered Outcome: Completed/Met Date Met:  02/04/14 Goal: Initial discharge plan identified Outcome: Completed/Met Date Met:  02/04/14 Goal: Voiding-avoid urinary catheter unless indicated Outcome: Completed/Met Date Met:  02/04/14 Goal: Hemodynamically stable Outcome: Completed/Met Date Met:  02/04/14  Problem: Phase II Progression Outcomes Goal: Progress activity as tolerated unless otherwise ordered Outcome: Completed/Met Date Met:  02/04/14 Goal: Discharge plan established Outcome: Completed/Met Date Met:  02/04/14 Goal: Vital signs remain stable Outcome: Completed/Met Date Met:  02/04/14 Goal: IV changed to normal saline lock Outcome: Completed/Met Date Met:  02/04/14 Goal: Obtain order to discontinue catheter if appropriate Outcome: Not Applicable Date Met:  35/32/99

## 2014-02-04 NOTE — Care Management (Signed)
CARE MANAGEMENT NOTE 02/04/2014  Patient:  Leon Nichols,Leon Nichols   Account Number:  [id number]  Date Initiated:  02/04/2014  Documentation initiated by:  Obed Samek  Subjective/Objective Assessment:   CM following for progression and d/c planning.     Action/Plan:   02/04/14 Request from MD for assistance with ostomy supplies, however, the hospital is unable to provide a week of ostomy supplies, this is not a new ostomy so we are unable to setup HH. The pt needs a prescription to fax to his supplier.   Anticipated DC Date:  02/04/2014   Anticipated DC Plan:  HOME/SELF CARE         Choice offered to / List presented to:             Status of service:   Medicare Important Message given?   (If response is "NO", the following Medicare IM given date fields will be blank) Date Medicare IM given:   Medicare IM given by:   Date Additional Medicare IM given:   Additional Medicare IM given by:    Discharge Disposition:  HOME/SELF CARE  Per UR Regulation:    If discussed at Long Length of Stay Meetings, dates discussed:    Comments:  02/04/2014 Spoke with pt MD, and have asked her to provide a prescription to this pt for ostomy supplies. The hospital does not provide ostomy supplies upon d/c. This pt is able to fax the prescription to his supplier and has Medicaid so would be able to purchase supplies at a medical supply store.      Johny Shockheryl Uriah Philipson RN MPH, case manager, [phone]

## 2014-02-04 NOTE — Progress Notes (Signed)
Subjective:  Patient was seen and examined this morning. Patient states he is doing well, he has no complaints. He denies any fever, chills, nausea, vomiting, abdominal pain. Peri-ostomy pain is a 0/10. Patient states he stools are becoming more firm, but still loose. He has tolerated a regular diet well.   Objective: Vital signs in last 24 hours: Filed Vitals:   02/03/14 1841 02/03/14 2102 02/04/14 0608 02/04/14 1045  BP: 142/68 117/73 124/81 136/86  Pulse: 81 101 90 98  Temp: 98.5 F (36.9 C) 98.2 F (36.8 C) 98.3 F (36.8 C) 98.2 F (36.8 C)  TempSrc:  Oral Oral Oral  Resp: 18 16 16 19   Height:  6\' 1"  (1.854 m)    Weight:  103.98 kg (229 lb 3.8 oz) 103.98 kg (229 lb 3.8 oz)   SpO2:  99% 100% 98%   Weight change: 2.2 kg (4 lb 13.6 oz)  Intake/Output Summary (Last 24 hours) at 02/04/14 1210 Last data filed at 02/04/14 1003  Gross per 24 hour  Intake    596 ml  Output   1975 ml  Net  -1379 ml   General: Vital signs reviewed.  Patient is well-developed and well-nourished, in no acute distress and cooperative with exam.  Cardiovascular: RRR, S1 normal, S2 normal, no murmurs, gallops, or rubs. Pulmonary/Chest: Clear to auscultation bilaterally, no wheezes, rales, or rhonchi. Abdominal: Soft, non-tender, non-distended, BS +, no masses, organomegaly, or guarding present. Ostomy bag has a moderate amount of light colored loose stool content.  Musculoskeletal: No joint deformities, erythema, or stiffness, ROM full and nontender. Extremities: No lower extremity edema bilaterally, pulses symmetric and intact bilaterally. No cyanosis or clubbing. Skin: Warm, dry and intact. No rashes or erythema. Psychiatric: Normal mood and affect. speech and behavior is normal. Cognition and memory are normal.   Lab Results: Basic Metabolic Panel:  Recent Labs Lab 02/03/14 0458 02/04/14 0511  NA 131* 137  K 4.2 4.3  CL 94* 97  CO2 21 27  GLUCOSE 98 116*  BUN 88* 48*  CREATININE 2.03*  0.97  CALCIUM 9.8 9.8  PHOS 3.3 2.4   Liver Function Tests:  Recent Labs Lab 02/02/14 0625 02/03/14 0458 02/04/14 0511  AST 13  --   --   ALT 8  --   --   ALKPHOS 68  --   --   BILITOT <0.2*  --   --   PROT 8.6*  --   --   ALBUMIN 3.7 3.7 3.5   CBC:  Recent Labs Lab 02/02/14 0625 02/03/14 0458  WBC 8.8 4.5  NEUTROABS 4.6  --   HGB 14.4 14.4  HCT 40.4 41.0  MCV 83.0 82.3  PLT 255 270   Urine Drug Screen: Drugs of Abuse     Component Value Date/Time   LABOPIA NONE DETECTED 02/02/2014 1345   COCAINSCRNUR POSITIVE* 02/02/2014 1345   LABBENZ NONE DETECTED 02/02/2014 1345   AMPHETMU NONE DETECTED 02/02/2014 1345   THCU NONE DETECTED 02/02/2014 1345   LABBARB NONE DETECTED 02/02/2014 1345    Urinalysis:  Recent Labs Lab 02/02/14 0736  COLORURINE YELLOW  LABSPEC 1.022  PHURINE 5.0  GLUCOSEU NEGATIVE  HGBUR NEGATIVE  BILIRUBINUR NEGATIVE  KETONESUR 15*  PROTEINUR 100*  UROBILINOGEN 0.2  NITRITE NEGATIVE  LEUKOCYTESUR NEGATIVE   Studies/Results: US Renal  02/02/2014   CLINICAL DATA:  Acute renal failure.  Hypertension.  HIV.  EXAM: RENAL/URINARY TRACT ULTRASOUND COMPLETE  COMPARISON:  03/29/2012  FINDINGS: Right Kidney:  Length: 12.8  cm. Mildly increased renal parenchymal echogenicity. No mass or hydronephrosis visualized.  Left Kidney:  Length: 12.7 cm. Mildly increased renal parenchymal echogenicity. No mass or hydronephrosis visualized.  Bladder:  Appears normal for degree of bladder distention.  IMPRESSION: Increased renal parenchymal echogenicity, consistent with medical renal disease. No evidence of hydronephrosis.   Electronically Signed   By: Myles Rosenthal M.D.   On: 02/02/2014 16:38   Medications:  I have reviewed the patient's current medications. Prior to Admission:  Prescriptions prior to admission  Medication Sig Dispense Refill Last Dose  . abacavir-lamiVUDine (EPZICOM) 600-300 MG per tablet Take 1 tablet by mouth daily. 30 tablet 11 02/01/2014  at Unknown time  . dolutegravir (TIVICAY) 50 MG tablet Take 1 tablet (50 mg total) by mouth daily. 30 tablet 11 02/01/2014 at Unknown time  . hydrochlorothiazide (HYDRODIURIL) 25 MG tablet Take 1 tablet (25 mg total) by mouth daily. 30 tablet 11 02/01/2014 at Unknown time  . lisinopril (PRINIVIL,ZESTRIL) 40 MG tablet Take 1 tablet (40 mg total) by mouth daily. 30 tablet 11 02/01/2014 at Unknown time   Scheduled Meds: . abacavir  600 mg Oral Daily  . carbamide peroxide  5 drop Both Ears BID  . dolutegravir  50 mg Oral Daily  . folic acid  1 mg Oral Daily  . heparin  5,000 Units Subcutaneous 3 times per day  . lamiVUDine  300 mg Oral Daily  . multivitamin with minerals  1 tablet Oral Daily  . thiamine  100 mg Oral Daily   Continuous Infusions:   PRN Meds:.HYDROcodone-acetaminophen, ondansetron **OR** ondansetron (ZOFRAN) IV Assessment/Plan: Active Problems:   Colitis   Acute renal failure  Viral Gastroenteritis: CT abdomen pelvis showed marked wall thickening of the entire descending colon with inflammatory change consistent with colitis. Patient's stools are becoming more firm, light colored, but still loose. Afebrile, no leukocytosis. Pain decreased to 0/10. No more nausea or vomiting. Improved with supportive management. Discharge to home today. Patient is requesting ostomy bags as he is almost out and [facility] will no longer prescribe them since he has not followed up with him.  -Prescribe ostomy bags  -Ostomy bags to take home -PCP follow up -[facility] follow up -Vicodin 5-325 Q6H prn -Ondansetron 4 mg po or IV Q6H prn -Stool culture pending -Regular Diet -I/Os  AKI: Resolved. Cr 8.8>2.03>0.97, back at baseline. [phone] in the setting of nausea, vomiting and decreased urine output. FENa 3.3, indicating ATN. Renal ultrasound showed echodense kidneys. AKI likely secondary to anti-retroviral therapy, but most likely secondary to hypovolemia in the setting of  ACEI. Nephrology recommends holding ACEI for 2-3 weeks. Patient is currently off fluids, but was receiving rehydration over the weekend. -Nephrology following -Restart HIV medications -Hold HCTZ, lisinopril until PCP follow up -Restart lisinopril in 2-3 weeks and hold in dehydration -I/Os, daily weights  Substance Abuse: UDS positive for cocaine. Patient has not required any ativan while on CIWA protocol. -Monitor  HIV: Patient followed by Dr. Drue Second. Most recent HIV RNA 142, CD4 500 in 12/2013.On abacavir-lamivudine 600-300 mg daily and dolutegravir 50 mg daily at home. Anti-retrovirals not likely a contributing factor to AKI. -Restart anti-retrovirals  Orthostatic Hypotension in the setting of Chronic HTN: BP 117/73-142/68 overnight. Orthostatics were positive 2 days ago. On HCTZ 25 daily and lisinopril 40 mg daily at home. These medications in addition to dehydration were likely contributing to his AKI and hypovolemia. -Hold HCTZ 25mg  daily and lisinopril 40 mg daily until PCP follow up  DVT  ppx: Heparin 5000 u SQ Q8H  Dispo: Disposition is deferred at this time, awaiting improvement of current medical problems.  Anticipated discharge in approximately 1-2 day(s).   The patient does have a current PCP (Ky Barban, MD) and does need an [facility] hospital follow-up appointment after discharge.  The patient does not have transportation limitations that hinder transportation to clinic appointments.  .Services Needed at time of discharge: Y = Yes, Blank = No PT:   OT:   RN:   Equipment:   Other:     LOS: 2 days   Jill Alexanders, DO PGY-1 Internal Medicine Resident Pager # [phone] 02/04/2014 12:10 PM    Date: 02/04/2014  Patient name: Leon Nichols  Medical record number: [id number]  Date of birth: [birth year]   This patient has been seen and the plan of care was discussed with the house staff. Please see their note for complete details. I concur with their findings with  the following additions/corrections: Appears to be improving. Will plan for d/c, f/u with PCP, ID.   Ginnie Smart, MD 02/04/2014, 12:14 PM

## 2014-02-04 NOTE — Progress Notes (Signed)
Discussed discharge instructions and medications with patient.  Ostomy pouches, wafer, and prescription given to patient.  All questions answered.

## 2014-02-04 NOTE — Discharge Instructions (Signed)
Thank you for allowing us to be involved in your healthcare while you were hospitalized at [facility].   Please note that there have been changes to your home medications.  --> PLEASE LOOK AT YOUR DISCHARGE MEDICATION LIST FOR DETAILS.  Please call your PCP if you have any questions or concerns, or any difficulty getting any of your medications.  Please return to the ER if you have worsening of your symptoms or new severe symptoms arise.  1. Please follow up in the Internal Medicine Clinic with Dr. Valentino Saxonothman on February 18, 2014 at 10:15 am.   2. Please stop taking your lisinopril and hydrochlorothiazide. Dr. Valentino Saxonothman may restart you on these medications as follow up depending on what your blood pressure is.  3. I have printed out a prescription for your ostomy bags for you to fax in. I am also sending you home with ostomy bags to carry you over.   4. I [facility] with Dr. Donell BeersByerly on February 22, 2014 at 8:30 am to discuss reversal of your ostomy. Dr. Donell BeersByerly will be able to prescribe your ostomy bags.   Viral Gastroenteritis Viral gastroenteritis is also known as stomach flu. This condition affects the stomach and intestinal tract. It can cause sudden diarrhea and vomiting. The illness typically lasts 3 to 8 days. Most people develop an immune response that eventually gets rid of the virus. While this natural response develops, the virus can make you quite ill. CAUSES  Many different viruses can cause gastroenteritis, such as rotavirus or noroviruses. You can catch one of these viruses by consuming contaminated food or water. You may also catch a virus by sharing utensils or other personal items with an infected person or by touching a contaminated surface. SYMPTOMS  The most common symptoms are diarrhea and vomiting. These problems can cause a severe loss of body fluids (dehydration) and a body salt (electrolyte) imbalance. Other  symptoms may include:  Fever.  Headache.  Fatigue.  Abdominal pain. DIAGNOSIS  Your caregiver can usually diagnose viral gastroenteritis based on your symptoms and a physical exam. A stool sample may also be taken to test for the presence of viruses or other infections. TREATMENT  This illness typically goes away on its own. Treatments are aimed at rehydration. The most serious cases of viral gastroenteritis involve vomiting so severely that you are not able to keep fluids down. In these cases, fluids must be given through an intravenous line (IV). HOME CARE INSTRUCTIONS   Drink enough fluids to keep your urine clear or pale yellow. Drink small amounts of fluids frequently and increase the amounts as tolerated.  Ask your caregiver for specific rehydration instructions.  Avoid:  Foods high in sugar.  Alcohol.  Carbonated drinks.  Tobacco.  Juice.  Caffeine drinks.  Extremely hot or cold fluids.  Fatty, greasy foods.  Too much intake of anything at one time.  Dairy products until 24 to 48 hours after diarrhea stops.  You may consume probiotics. Probiotics are active cultures of beneficial bacteria. They may lessen the amount and number of diarrheal stools in adults. Probiotics can be found in yogurt with active cultures and in supplements.  Wash your hands well to avoid spreading the virus.  Only take over-the-counter or prescription medicines for pain, discomfort, or fever as directed by your caregiver. Do not give aspirin to children. Antidiarrheal medicines are not recommended.  Ask your caregiver if you should continue to take  your regular prescribed and over-the-counter medicines.  Keep all follow-up appointments as directed by your caregiver. SEEK IMMEDIATE MEDICAL CARE IF:   You are unable to keep fluids down.  You do not urinate at least once every 6 to 8 hours.  You develop shortness of breath.  You notice blood in your stool or vomit. This may look  like coffee grounds.  You have abdominal pain that increases or is concentrated in one small area (localized).  You have persistent vomiting or diarrhea.  You have a fever.  The patient is a child younger than 3 months, and he or she has a fever.  The patient is a child older than 3 months, and he or she has a fever and persistent symptoms.  The patient is a child older than 3 months, and he or she has a fever and symptoms suddenly get worse.  The patient is a baby, and he or she has no tears when crying. MAKE SURE YOU:   Understand these instructions.  Will watch your condition.  Will get help right away if you are not doing well or get worse. Document Released: 03/22/2005 Document Revised: 06/14/2011 Document Reviewed: 01/06/2011 [facility] Patient Information 2015 [place]. This information is not intended to replace advice given to you by your health care provider. Make sure you discuss any questions you have with your health care provider.

## 2014-02-04 NOTE — Progress Notes (Signed)
No charge note -  [facility]  Renal function at baseline 0.97 (yesterday had shown rapid improvement from a creatinine of 8.6->2.) and pt non-oliguric. No further renal followup needed.  Camille Balynthia Breck Hollinger, MD [facility] [phone] Pager 02/04/2014, 11:32 AM

## 2014-02-04 NOTE — Discharge Summary (Signed)
Name: Leon Nichols MRN: [id number] DOB: 03-19-1966 48 y.o. PCP: Blain Pais, MD  Date of Admission: 02/02/2014  5:30 AM Date of Discharge: 02/04/2014 Attending Physician: Dr. Johnnye Sima Discharge Diagnosis:  Principal Problem:   Colitis Active Problems:   HIV (human immunodeficiency virus infection)   Acute renal failure   Altered bowel elimination due to intestinal ostomy  Discharge Medications:   Medication List    STOP taking these medications        hydrochlorothiazide 25 MG tablet  Commonly known as:  HYDRODIURIL     lisinopril 40 MG tablet  Commonly known as:  PRINIVIL,ZESTRIL      TAKE these medications        abacavir-lamiVUDine 600-300 MG per tablet  Commonly known as:  EPZICOM  Take 1 tablet by mouth daily.     dolutegravir 50 MG tablet  Commonly known as:  TIVICAY  Take 1 tablet (50 mg total) by mouth daily.     Ostomy Supplies POUCH Misc  1 Container by Does not apply route daily.        Disposition and follow-up:   Mr.Raheem H Drewry was discharged from [facility] in Good condition.  At the hospital follow up visit please address:  1.  Acute Viral Gastroenteritis: Please assess continued resolution of symptoms.  AKI: Please repeat BMET to confirm resolution of AKI.  Hypertension: Please reassess blood pressure and add back HCTZ and lisinopril if necessary. Please see below.  Descending Colon Lymphadenopathy: CT abdomen/pelvis should innumerable small lymph nodes, likely due to colitis, but malignancy could not be ruled out. Please consider repeat CT abdomen/pelvis to assess.  2.  Labs / imaging needed at time of follow-up: CT abdomen/pelvis, BMET  3.  Pending labs/ test needing follow-up: Stool Culture  Follow-up Appointments: Follow-up Information    Follow up with Lottie Mussel, MD On 02/18/2014.   Specialty:  Internal Medicine   Why:  10:15 am   Contact information:   [place]  [address] [phone]       Follow up with Stark Klein, MD On 02/22/2014.   Specialty:  General Surgery   Why:  8:30 am   Contact information:   [place] [address] [phone]       Discharge Instructions: Discharge Instructions    Call MD for:  persistant nausea and vomiting    Complete by:  As directed      Call MD for:  severe uncontrolled pain    Complete by:  As directed      Call MD for:  temperature >100.4    Complete by:  As directed      Diet - low sodium heart healthy    Complete by:  As directed      Increase activity slowly    Complete by:  As directed            Consultations:   None  Procedures Performed:  Ct Abdomen Pelvis Wo Contrast  02/02/2014   CLINICAL DATA:  Redness around ostomy site.  EXAM: CT ABDOMEN AND PELVIS WITHOUT CONTRAST  TECHNIQUE: Multidetector CT imaging of the abdomen and pelvis was performed following the standard protocol without IV contrast.  COMPARISON:  03/23/2012.  08/29/2011.  FINDINGS: Left lower quadrant sigmoid loop colostomy is in place. There is extensive wall thickening of the descending colon traversing into the ostomy site. There is associated inflammatory changes in the adjacent fat as well  as several mesenteric lymph nodes. None are greater than 10 mm in short axis diameter. Subjectively, there number has increased. Diverticuli are present in the area of wall thickening. No pneumatosis or extraluminal bowel gas.  Liver, gallbladder, spleen, pancreas, adrenal glands are within normal limits. Stable appearance of the kidneys. No hydronephrosis.  No abscess.  No free-fluid  Bladder and prostate are unremarkable  Normal appendix.  Stable enlarged left inguinal lymph node with a short axis diameter of 13 mm.  L5-S1 degenerative disc disease and left foraminal stenosis.  IMPRESSION: Left lower quadrant sigmoid loop colostomy has been performed. There is marked wall thickening of the entering descending  colon with inflammatory change and innumerable small lymph nodes. Findings may represent an inflammatory process. Malignancy can have a similar appearance. Gastroenterology consultation recommended.   Electronically Signed   By: Maryclare Bean M.D.   On: 02/02/2014 09:42   US Renal  02/02/2014   CLINICAL DATA:  Acute renal failure.  Hypertension.  HIV.  EXAM: RENAL/URINARY TRACT ULTRASOUND COMPLETE  COMPARISON:  03/29/2012  FINDINGS: Right Kidney:  Length: 12.8 cm. Mildly increased renal parenchymal echogenicity. No mass or hydronephrosis visualized.  Left Kidney:  Length: 12.7 cm. Mildly increased renal parenchymal echogenicity. No mass or hydronephrosis visualized.  Bladder:  Appears normal for degree of bladder distention.  IMPRESSION: Increased renal parenchymal echogenicity, consistent with medical renal disease. No evidence of hydronephrosis.   Electronically Signed   By: Earle Gell M.D.   On: 02/02/2014 16:38   Admission HPI: Mr Conradt is a 48 year old man s/p diverted colostomy 2013 2/2 recurrent perirectal abscesses, HIV (RNA 142, CD4 500 in 12/2013), HTN presents with one week history of abdominal pain. He was in his usual state of health until ~1 week ago when he developed L-sided abdominal pain surrounding his ostomy site. He describes the pain as sharp and a 12/10. He has tried over the counter medications for it but is unclear on what he takes (he was confused between tylenol and advil). He also reports increased ostomy output that is liquid but very dark. He says he is now changing his bag ~ twice a day when normally it is once a day. No recent antibiotics. He has had the same appetite but much nausea and non-bloody non-bilious emesis. He thinks this is because he was sitting near someone who was coughing at a diner a week ago. He has a dry cough. He has decreased urine output and says it is very dark. He is also lightheaded when he stands up. He denies fever, chills, night sweats, chest pain,  shortness of breath, dysuria, leg swelling, orthopnea, or weight change. He drank several beers and shots last night but only drinks once a week. He used cocaine one week ago but says he only uses it "recreationally." He is followed by Dr. Baxter Flattery for HIV he says was diagnosed in 2013. He says he has had issues with getting medications in the past and was recently restarted on his anti-retrovirals in 11/2013. Most recent HIV RNA 142, CD4 500 in 12/2013. He has had no complications from his diverting colostomy performed by Dr.Streck in 2013 secondary to recurrent perirectal abscesses.  Hospital Course by problem list: Principal Problem:   Colitis Active Problems:   HIV (human immunodeficiency virus infection)   Acute renal failure   Altered bowel elimination due to intestinal ostomy   Viral Gastroenteritis: Patient presented with nausea, vomiting, and loose dark stools. CT abdomen/pelvis showed marked wall thickening of  the entire descending colon with inflammatory change consistent with colitis. Patient remained afebrile and without leukocytosis. His peri-ostomy pain resolved to a 0/10. Patient's nausea and vomiting improved and patient tolerated a regular diet. Patient's diarrhea improved with supportive management with normal saline IV and sodium bicarbonate.   Colonic Thickening and Lymphadenopathy: CT abdomen/pelvis showed marked wall thickening of the entire descending colon with inflammatory change and innumerable lymph nodes. This finding was thought to be consistent with colitis, but malignancy could not be ruled out. Please consider getting a repeat CT abdomen/pelvis to assess resolution.  S/p Colostomy: Patient had a colostomy in 2013 with [facility] for recurrent perirectal abscesses. Patient was unsure of the cause of the perirectal abscesses at the time, but he denies any recurrent abscesses. Patient could likely have a colostomy reversal. I have referred him back to [facility] to meet with Dr. Barry Dienes for this discussion. Patient is also out of colostomy bags. I have sent him home with samples, and have been in touch with his colostomy supplier to fax new prescription.   AKI: Patient presented with a Cr of 8.8 which improved back to baseline at 0.97. BUN 125>88>48. Acute kidney injury was likely secondary to hypovolemia in the setting of nausea, vomiting and decreased urine output in combination with HCTZ and lisinopril. Renal ultrasound showed echodense kidneys. AKI not likely secondary to anti-retroviral therapy since patient has been on medications since August and abacavir-lamivudine and dolutegravir are not notorious for AKI. We held lisinopril and HCTZ during admission and on discharge. HCTZ and lisinopril can be restarted as outpatient as blood pressure allows, but patient should be re-educated on holding lisinopril during dehydration. Please consider rechecking BMET to assess kidney function.  Substance Abuse: UDS positive for cocaine. Patient did not require any ativan while on CIWA protocol.  HIV: Patient followed by Dr. Baxter Flattery. Most recent HIV RNA 142, CD4 500 in 12/2013.On abacavir-lamivudine 600-300 mg daily and dolutegravir 50 mg daily at home. Anti-retrovirals not likely a contributing factor to AKI. We initially held anti-retrovirals, but restarted medications on day of discharge. Patient had a negative HLA-B57 in 2013 so there was little concern for hypersensitivity reaction.  Orthostatic Hypotension in the setting of Chronic HTN: Patient's blood pressure was low-normal and he had positive orthostatics showing 126/74 sitting and 103/62 standing. HR 94>107. He is normally on HCTZ 25 daily and lisinopril 40 mg daily at home. These medications in addition to dehydration were likely contributing to his AKI and hypovolemia. We held his HCTZ 58m daily and lisinopril 40 mg daily during his stay and on discharge. Please consider restarting HCTZ and  lisinopril at outpatient follow up if blood pressure necessitates medications. Please re-educate patient on holding his lisinopril if he is dehydrated.  Discharge Vitals:   BP 136/86 mmHg  Pulse 98  Temp(Src) 98.2 F (36.8 C) (Oral)  Resp 19  Ht 6' 1" (1.854 m)  Wt 103.98 kg (229 lb 3.8 oz)  BMI 30.25 kg/m2  SpO2 98%  Discharge Labs:  Results for orders placed or performed during the hospital encounter of 02/02/14 (from the past 24 hour(s))  Renal function panel     Status: Abnormal   Collection Time: 02/04/14  5:11 AM  Result Value Ref Range   Sodium 137 137 - 147 mEq/L   Potassium 4.3 3.7 - 5.3 mEq/L   Chloride 97 96 - 112 mEq/L   CO2 27 19 - 32 mEq/L   Glucose, Bld 116 (H) 70 -  99 mg/dL   BUN 48 (H) 6 - 23 mg/dL   Creatinine, Ser 0.97 0.50 - 1.35 mg/dL   Calcium 9.8 8.4 - 10.5 mg/dL   Phosphorus 2.4 2.3 - 4.6 mg/dL   Albumin 3.5 3.5 - 5.2 g/dL   GFR calc non Af Amer >90 >90 mL/min   GFR calc Af Amer >90 >90 mL/min   Anion gap 13 5 - 15    Signed: Osa Craver, DO PGY-1 Internal Medicine Resident Pager # [phone] 02/04/2014 3:07 PM

## 2014-02-18 ENCOUNTER — Ambulatory Visit: Payer: Medicaid Other | Admitting: Internal Medicine

## 2014-02-20 NOTE — Addendum Note (Signed)
Addended by: Neomia DearPOWERS, Courtlyn Aki E on: 02/20/2014 06:01 PM   Modules accepted: Orders

## 2014-03-05 ENCOUNTER — Ambulatory Visit: Payer: Medicaid Other | Admitting: Internal Medicine

## 2014-04-25 ENCOUNTER — Encounter: Payer: Self-pay | Admitting: Internal Medicine

## 2014-04-25 ENCOUNTER — Ambulatory Visit (INDEPENDENT_AMBULATORY_CARE_PROVIDER_SITE_OTHER): Payer: Medicare Other | Admitting: Internal Medicine

## 2014-04-25 VITALS — BP 215/115 | HR 89 | Temp 97.8°F | Wt 247.0 lb

## 2014-04-25 DIAGNOSIS — I10 Essential (primary) hypertension: Secondary | ICD-10-CM

## 2014-04-25 DIAGNOSIS — Z21 Asymptomatic human immunodeficiency virus [HIV] infection status: Secondary | ICD-10-CM | POA: Diagnosis not present

## 2014-04-25 DIAGNOSIS — F1990 Other psychoactive substance use, unspecified, uncomplicated: Secondary | ICD-10-CM

## 2014-04-25 LAB — CBC WITH DIFFERENTIAL/PLATELET
BASOS PCT: 0 % (ref 0–1)
Basophils Absolute: 0 10*3/uL (ref 0.0–0.1)
Eosinophils Absolute: 0.3 10*3/uL (ref 0.0–0.7)
Eosinophils Relative: 6 % — ABNORMAL HIGH (ref 0–5)
HEMATOCRIT: 43.4 % (ref 39.0–52.0)
Hemoglobin: 15 g/dL (ref 13.0–17.0)
LYMPHS PCT: 45 % (ref 12–46)
Lymphs Abs: 2.3 10*3/uL (ref 0.7–4.0)
MCH: 31.1 pg (ref 26.0–34.0)
MCHC: 34.6 g/dL (ref 30.0–36.0)
MCV: 90 fL (ref 78.0–100.0)
MPV: 9.2 fL (ref 8.6–12.4)
Monocytes Absolute: 0.5 10*3/uL (ref 0.1–1.0)
Monocytes Relative: 10 % (ref 3–12)
Neutro Abs: 2 10*3/uL (ref 1.7–7.7)
Neutrophils Relative %: 39 % — ABNORMAL LOW (ref 43–77)
Platelets: 311 10*3/uL (ref 150–400)
RBC: 4.82 MIL/uL (ref 4.22–5.81)
RDW: 16.2 % — ABNORMAL HIGH (ref 11.5–15.5)
WBC: 5.2 10*3/uL (ref 4.0–10.5)

## 2014-04-25 LAB — COMPLETE METABOLIC PANEL WITH GFR
ALBUMIN: 4.3 g/dL (ref 3.5–5.2)
ALK PHOS: 58 U/L (ref 39–117)
ALT: 10 U/L (ref 0–53)
AST: 18 U/L (ref 0–37)
BUN: 8 mg/dL (ref 6–23)
CO2: 24 meq/L (ref 19–32)
Calcium: 9.2 mg/dL (ref 8.4–10.5)
Chloride: 105 mEq/L (ref 96–112)
Creat: 0.84 mg/dL (ref 0.50–1.35)
GFR, Est African American: >89 mL/min
Glucose, Bld: 104 mg/dL — ABNORMAL HIGH (ref 70–99)
POTASSIUM: 3.5 meq/L (ref 3.5–5.3)
SODIUM: 140 meq/L (ref 135–145)
Total Bilirubin: 0.3 mg/dL (ref 0.2–1.2)
Total Protein: 7.6 g/dL (ref 6.0–8.3)

## 2014-04-25 MED ORDER — AMLODIPINE BESYLATE 10 MG PO TABS: 10.0000 mg | ORAL_TABLET | Freq: Every day | ORAL | Status: DC

## 2014-04-25 MED ORDER — LISINOPRIL 40 MG PO TABS: 40.0000 mg | ORAL_TABLET | Freq: Every day | ORAL | Status: DC

## 2014-04-25 MED ORDER — CLONIDINE HCL 0.1 MG PO TABS
0.2000 mg | ORAL_TABLET | Freq: Once | ORAL | Status: AC
  Administered 2014-04-25: 0.2 mg via ORAL

## 2014-04-25 NOTE — Progress Notes (Signed)
Patient ID: Leon Nichols, male   DOB: 1965-12-18, 49 y.o.   MRN: [id number]       Patient ID: Leon Nichols, male   DOB: 1965-12-18, 49 y.o.   MRN: [id number]  HPI 49 yo M with HIV disease, CD 4 count 500/VL 143 (sep 2015) on tivicay/epzicom. He reports not taking his hypertensive medications today. Has upcoming appointment for ostomy reversal.  He had episode of viral gastroenteritis and aki in November, but has not been seen since. Only now taking lisinopril 40mg  daily  Outpatient Encounter Prescriptions as of 04/25/2014  Medication Sig  . abacavir-lamiVUDine (EPZICOM) 600-300 MG per tablet Take 1 tablet by mouth daily.  . dolutegravir (TIVICAY) 50 MG tablet Take 1 tablet (50 mg total) by mouth daily.  Sherren Mocha. Ostomy Supplies POUCH MISC 1 Container by Does not apply route daily.     Patient Active Problem List   Diagnosis Date Noted  . Altered bowel elimination due to intestinal ostomy 02/04/2014  . Colitis 02/02/2014  . Acute renal failure 02/02/2014  . Neuralgia, post-herpetic 01/02/2014  . Gingivitis 01/02/2014  . HIV (human immunodeficiency virus infection) 03/24/2012  . Anemia 03/23/2012  . Penile ulcer 03/23/2012  . Diverticulosis of colon (without mention of hemorrhage) 08/31/2011  . HYPERTENSION, BENIGN ESSENTIAL 03/29/2007     Health Maintenance Due  Topic Date Due  . TETANUS/TDAP  10/27/1986    Family hx : hypertension (M), stroke, and dm Review of Systems Review of Systems  Constitutional: Negative for fever, chills, diaphoresis, activity change, appetite change, fatigue and unexpected weight change.  HENT: Negative for congestion, sore throat, rhinorrhea, sneezing, trouble swallowing and sinus pressure.  Eyes: Negative for photophobia and visual disturbance.  Respiratory: Negative for cough, chest tightness, shortness of breath, wheezing and stridor.  Cardiovascular: Negative for chest pain, palpitations and leg swelling.  Gastrointestinal: Negative for nausea,  vomiting, abdominal pain, diarrhea, constipation, blood in stool, abdominal distention and anal bleeding.  Genitourinary: Negative for dysuria, hematuria, flank pain and difficulty urinating.  Musculoskeletal: Negative for myalgias, back pain, joint swelling, arthralgias and gait problem.  Skin: Negative for color change, pallor, rash and wound.  Neurological: Negative for dizziness, tremors, weakness and light-headedness.  Hematological: Negative for adenopathy. Does not bruise/bleed easily.  Psychiatric/Behavioral: Negative for behavioral problems, confusion, sleep disturbance, dysphoric mood, decreased concentration and agitation.    Physical Exam   BP 215/115 mmHg  Pulse 89  Temp(Src) 97.8 F (36.6 C) (Oral)  Wt 247 lb (112.038 kg) Physical Exam  Constitutional: He is oriented to person, place, and time. He appears well-developed and well-nourished. No distress.  HENT:  Mouth/Throat: Oropharynx is clear and moist. No oropharyngeal exudate.  Cardiovascular: Normal rate, regular rhythm and normal heart sounds. Exam reveals no gallop and no friction rub.  No murmur heard.  Pulmonary/Chest: Effort normal and breath sounds normal. No respiratory distress. He has no wheezes.  Abdominal: Soft. Bowel sounds are normal. He exhibits no distension. There is no tenderness.  Lymphadenopathy:  He has no cervical adenopathy.  Neurological: He is alert and oriented to person, place, and time.  Skin: Skin is warm and dry. No rash noted. No erythema.  Psychiatric: He has a normal mood and affect. His behavior is normal.    Lab Results  Component Value Date   CD4TCELL 28* 01/01/2014   Lab Results  Component Value Date   CD4TABS 500 01/01/2014   CD4TABS 510 11/08/2013   CD4TABS 590 07/27/2012   Lab Results  Component Value Date  HIV1RNAQUANT 143* 01/01/2014   No results found for: HEPBSAB No results found for: RPR  CBC Lab Results  Component Value Date   WBC 4.5 02/03/2014   RBC  4.98 02/03/2014   HGB 14.4 02/03/2014   HCT 41.0 02/03/2014   PLT 270 02/03/2014   MCV 82.3 02/03/2014   MCH 28.9 02/03/2014   MCHC 35.1 02/03/2014   RDW 15.9* 02/03/2014   LYMPHSABS 3.5 02/02/2014   MONOABS 0.8 02/02/2014   EOSABS 0.0 02/02/2014   BASOSABS 0.0 02/02/2014   BMET Lab Results  Component Value Date   NA 137 02/04/2014   K 4.3 02/04/2014   CL 97 02/04/2014   CO2 27 02/04/2014   GLUCOSE 116* 02/04/2014   BUN 48* 02/04/2014   CREATININE 0.97 02/04/2014   CALCIUM 9.8 02/04/2014   GFRNONAA >90 02/04/2014   GFRAA >90 02/04/2014     Assessment and Plan  hiv disease= will need to recheck labs today, last done in sept when he had low level viremia  Hypertensive urgency = surprisingly not symptomatic, suspect he has been poorly controlled for several months. Gave clonidine .  in clinic. Repeat blood pressure after 1 hr, had some improvement down to 200 in SBP. He is hesistant in going to ED/UC since he has no pcp. We will have him take lisinopril (which he did not take this morning plus Will start amlodipine  today and start today in addition to his lisinopril . Will have him bp check on monday  Illicit drug use = patient disclosed to bridge counselor that the had smoked crack prior to visit, possibly attributing to elevated BP during visit. At next visit, will review with patient harms of cocaine and cardiovascular risks  Will need to establish with pcp  Spent 25 min face to face time discussion on hypertension management

## 2014-04-26 LAB — T-HELPER CELL (CD4) - (RCID CLINIC ONLY)
CD4 % Helper T Cell: 31 % — ABNORMAL LOW (ref 33–55)
CD4 T Cell Abs: 770 /uL (ref 400–2700)

## 2014-04-29 LAB — HIV-1 RNA QUANT-NO REFLEX-BLD
HIV 1 RNA Quant: 46 copies/mL — ABNORMAL HIGH (ref <20)
HIV-1 RNA QUANT, LOG: 1.66 {Log} — AB (ref <1.30)

## 2014-06-10 ENCOUNTER — Other Ambulatory Visit: Payer: Medicare Other

## 2014-06-24 ENCOUNTER — Ambulatory Visit: Payer: Medicare Other | Admitting: Internal Medicine

## 2014-08-16 ENCOUNTER — Encounter: Payer: Self-pay | Admitting: *Deleted

## 2014-12-20 ENCOUNTER — Other Ambulatory Visit: Payer: Self-pay | Admitting: Internal Medicine

## 2014-12-20 DIAGNOSIS — B20 Human immunodeficiency virus [HIV] disease: Secondary | ICD-10-CM

## 2015-01-25 ENCOUNTER — Other Ambulatory Visit: Payer: Self-pay | Admitting: Internal Medicine

## 2015-02-03 ENCOUNTER — Encounter (HOSPITAL_COMMUNITY): Payer: Self-pay | Admitting: Emergency Medicine

## 2015-02-03 ENCOUNTER — Emergency Department (HOSPITAL_COMMUNITY)
Admission: EM | Admit: 2015-02-03 | Discharge: 2015-02-03 | Disposition: A | Discharge status: Home / Self Care | Payer: Medicare Other | Source: Home / Self Care | Attending: Emergency Medicine | Admitting: Emergency Medicine

## 2015-02-03 ENCOUNTER — Emergency Department (HOSPITAL_COMMUNITY): Payer: Medicare Other

## 2015-02-03 DIAGNOSIS — I10 Essential (primary) hypertension: Secondary | ICD-10-CM | POA: Diagnosis not present

## 2015-02-03 DIAGNOSIS — R2241 Localized swelling, mass and lump, right lower limb: Secondary | ICD-10-CM | POA: Diagnosis not present

## 2015-02-03 DIAGNOSIS — M25461 Effusion, right knee: Secondary | ICD-10-CM

## 2015-02-03 DIAGNOSIS — Z21 Asymptomatic human immunodeficiency virus [HIV] infection status: Secondary | ICD-10-CM | POA: Diagnosis not present

## 2015-02-03 DIAGNOSIS — K610 Anal abscess: Secondary | ICD-10-CM | POA: Diagnosis not present

## 2015-02-03 DIAGNOSIS — M25561 Pain in right knee: Secondary | ICD-10-CM

## 2015-02-03 DIAGNOSIS — F17210 Nicotine dependence, cigarettes, uncomplicated: Secondary | ICD-10-CM | POA: Diagnosis not present

## 2015-02-03 DIAGNOSIS — M7989 Other specified soft tissue disorders: Secondary | ICD-10-CM | POA: Diagnosis not present

## 2015-02-03 DIAGNOSIS — M009 Pyogenic arthritis, unspecified: Secondary | ICD-10-CM | POA: Diagnosis not present

## 2015-02-03 DIAGNOSIS — H9192 Unspecified hearing loss, left ear: Secondary | ICD-10-CM | POA: Diagnosis not present

## 2015-02-03 LAB — SYNOVIAL CELL COUNT + DIFF, W/ CRYSTALS
CRYSTALS FLUID: NONE SEEN
Lymphocytes-Synovial Fld: 8 % (ref 0–20)
MONOCYTE-MACROPHAGE-SYNOVIAL FLUID: 4 % — AB (ref 50–90)
Neutrophil, Synovial: 88 % — ABNORMAL HIGH (ref 0–25)
WBC, Synovial: 128500 /mm3 — ABNORMAL HIGH (ref 0–200)

## 2015-02-03 LAB — CBG MONITORING, ED: Glucose-Capillary: 119 mg/dL — ABNORMAL HIGH (ref 65–99)

## 2015-02-03 MED ORDER — IBUPROFEN 800 MG PO TABS: 800.0000 mg | ORAL_TABLET | Freq: Three times a day (TID) | ORAL | Status: DC

## 2015-02-03 MED ORDER — OXYCODONE-ACETAMINOPHEN 5-325 MG PO TABS
2.0000 | ORAL_TABLET | Freq: Once | ORAL | Status: AC
  Administered 2015-02-03: 2 via ORAL
  Filled 2015-02-03: qty 2

## 2015-02-03 MED ORDER — LIDOCAINE-EPINEPHRINE 2 %-1:100000 IJ SOLN
1.7000 mL | Freq: Once | INTRAMUSCULAR | Status: AC
  Administered 2015-02-03: 1.7 mL
  Filled 2015-02-03: qty 1

## 2015-02-03 NOTE — Discharge Instructions (Signed)
You have been seen today for knee pain. Your imaging showed no abnormalities. Follow up with orthopedics outpatient as soon as possible. Follow up with PCP as needed. Return to ED should symptoms worsen.   Emergency Department Resource Guide 1) Find a Doctor and Pay Out of Pocket Although you won't have to find out who is covered by your insurance plan, it is a good idea to ask around and get recommendations. You will then need to call the office and see if the doctor you have chosen will accept you as a new patient and what types of options they offer for patients who are self-pay. Some doctors offer discounts or will set up payment plans for their patients who do not have insurance, but you will need to ask so you aren't surprised when you get to your appointment.  2) Contact Your Local Health Department Not all health departments have doctors that can see patients for sick visits, but many do, so it is worth a call to see if yours does. If you don't know where your local health department is, you can check in your phone book. The CDC also has a tool to help you locate your state's health department, and many state websites also have listings of all of their local health departments.  3) Find a Walk-in Clinic If your illness is not likely to be very severe or complicated, you may want to try a walk in clinic. These are popping up all over the country in pharmacies, drugstores, and shopping centers. They're usually staffed by nurse practitioners or physician assistants that have been trained to treat common illnesses and complaints. They're usually fairly quick and inexpensive. However, if you have serious medical issues or chronic medical problems, these are probably not your best option.  No Primary Care Doctor: - Call Health Connect at  [phone] - they can help you locate a primary care doctor that  accepts your insurance, provides certain services, etc. - Physician Referral Service-  [phone]  Chronic Pain Problems: Organization         Address  Phone   Notes  [facility]  [phone] Patients need to be referred by their primary care doctor.   Medication Assistance: Organization         Address  Phone   Notes  [facility] [address] [place], [place] [id number] [phone] --Must be a resident of [facility] -- Must have NO insurance coverage whatsoever (no Medicaid/ Medicare, etc.) -- The pt. MUST have a primary care doctor that directs their care regularly and follows them in the community   MedAssist  [phone]   [employer]  [phone]    Agencies that provide inexpensive medical care: Organization         Address  Phone   Notes  [facility]  [phone]   [facility]    [phone]   [facility] [address] [place], [place] [address] [phone]   [facility] [address] [place], [place] [phone]   Planned Parenthood    [phone]   [facility]    [phone]   [facility]  [address], [place] Phone:  [phone], Fax:  [phone] Hours of Operation:  9 am - 6 pm, M-F.  Also accepts Medicaid/Medicare and self-pay.  [facility]  [address], [place] Phone: [phone], Fax: [phone]. Hours of Operation:  8:30 am - 5:30 pm, M-F.  Also accepts Medicaid and self-pay.  [facility] [place] [address] [place] Phone: [phone]   [place], [place], [place] [phone], Ext. [phone] Mondays & Thursdays: 7-9 AM.  First 15 patients are seen on a first come, first serve basis.    [place] Providers:  Organization         Address  Phone   Notes  [facility] [address],  [place] [phone] Also accepts self-pay patients.  [facility] [id number] [address], [place]  [phone]   [place], [address], [place] [phone]   [facility] [address] [place] [phone]   Lucianne Lei [address], [place]   [phone] Only accepts [place] Access [place] patients after they have their name applied to their card.   Self-Pay (no insurance) in [facility]:  Organization         Address  Phone   Notes  Sickle Cell Patients, [facility] Internal [facility] [place] [phone]   [facility] Urgent Care [place] [phone]   [facility] [place]  [place], [place], [place] [phone]   [facility]/Dr. Osei-Bonsu  [address] [place] or [address], [place] [phone] Phone number for both [place] and [place] locations is the same.  [facility] [address] [place] [phone]   [facility] [address] [place] or [address] [place] [address] [phone]   [facility] [address] [place] [phone], phone; [phone], fax Sees patients 1st and 3rd Saturday of every month.  Must not qualify for public or private insurance (i.e. Medicaid, Medicare, [place] Health Choice, Veterans' Benefits)  Household income should be no more than 200% of the poverty level The clinic cannot treat you if you are pregnant or think you are pregnant  Sexually transmitted diseases are not treated at the clinic.    Dental Care: Organization         Address  Phone  Notes  [facility] Department of [facility] [phone] Accepts children up to age 64 who are enrolled in  [place] or [place]; pregnant women with a Medicaid card; and children who have applied for Medicaid or [place] Health Choice, but were declined, whose parents can pay a reduced fee at time of service.  [facility] Department of [facility]  [address], [place] [phone] Accepts children up to age 59 who are enrolled in [place] or [place]; pregnant women with a Medicaid card; and children who have applied for Medicaid or [place] Health Choice, but were declined, whose parents can pay a reduced fee at time of service.  [facility] Access PRO[facility]  [place] [phone] Patients are seen by appointment only. Walk-ins are not accepted. [place] will see patients 70 years of age and older. Monday - Tuesday (8am-5pm) Most Wednesdays (8:30-5pm) $30 per visit, cash only  [place] [facility]  [address], [facility] [phone] Patients are seen by appointment only. Walk-ins are not accepted. [place] will see patients 55 years of age and older. One Wednesday Evening (Monthly: Volunteer Based).  $30 per visit, cash only  [place]  [phone] for adults; Children under age 16, call Graduate Pediatric Dentistry at [phone]. Children aged 15-14, please call [phone] to request a pediatric application.  Dental services are provided in all areas of dental care including fillings, crowns and bridges, complete and partial dentures, implants, gum treatment, root canals, and extractions. Preventive care is also provided. Treatment is provided to both adults and children. Patients are selected via a lottery and there is often a waiting list.   [facility] [address] [place]  [phone] [email or web address]   [facility] [address] [place], [place] [phone], Ext. [phone] Second and Fourth Thursday of each month, opens at 6:30  AM; Clinic ends at 9 AM.  Patients are seen on a first-come first-served basis, and a limited number are seen during each clinic.   [facility]  [address] Hillard Danker [place], [place] [phone]   Eligibility Requirements You must have lived in [place], [place], or [place] counties for at least the last three months.   You cannot be eligible for state or federal sponsored [employer], including [employer], [place], or [employer].   You generally cannot be eligible for healthcare insurance through your employer.    How to apply: Eligibility screenings are held every Tuesday and Wednesday afternoon from 1:00 pm until 4:00 pm. You do not need an appointment for the interview!  [facility] [address] [place], [place]   [employer]  [place] Department  [place]  [phone]    Behavioral Health Resources in the Community: Intensive Outpatient Programs Organization         Address  Phone  Notes  [place]. [address], [place], [place] [phone]   [facility] [address], [place], [employer]   ADS: Alcohol & Drug Svcs [address], [place], [place]   [place] [address]  [place], [place] or [phone]   Substance Abuse Resources Organization         Address  Phone  Notes  Alcohol and Drug Services  [phone]   [place]  [phone]   The [place]   Chinita Pester  [phone]   Residential & Outpatient Substance Abuse Program  [phone]   Psychological Services Organization         Address  Phone  Notes  [facility] [place]  [place]  [phone]   [place] [address] [place] [phone] or  [phone]    Mobile Crisis Teams Organization         Address  Phone  Notes  [facility], Mobile Crisis Care Unit  [phone]   [facility]  [address] [place], [place]   [place] Levels [address] [place] [phone]    Self-Help/Support Groups Organization         Address  Phone             Notes  [facility]ental Health Assoc[facility] of [place] - variety of support groups  [place] Call for more information  Narcotics Anonymous (NA), Caring Services [address], [employer] [place]  2 meetings at this location   Special educational needs teacher         Address  Phone  Notes  [facility] [place],    [place]  [phone]   [facility]  [address], [place] [address], [place], [place]   [place], [place] [phone] Admissions: 8am-3pm M-F  Incentives Substance [place] [address],    [place], [place] [phone]   [facility] [address] [place], [place], [place]   [facility] [address],  [place], [place]   [facility] - Intensive Outpatient [address], [place], [place]   [facility] ([place] [address].,  [place], [place] [phone] or [phone]   [facility] (RTS) [address] [place], [place] Accepts Medicaid  [facility] [place] [address]  [place] [phone] Substance Abuse/Addiction Treatment   [facility] Organization         Address  Phone  Notes  [facility]  [phone]   Domenic Schwab, PhD [address] Arlis Porta [place], [place]   [phone] or [phone]   [place] Percy [place], [place] [phone]   [facility] [address],  [place], [place] [phone] Insurance/Medicaid/sponsorship through [facility] and Families [address], [address]                                    [place], [place] [phone] [place] [address], [place] [phone]    Dr. Adele Schilder  [phone]   [facility] Dept. 1) [address] [place] 2) [place] 3)  [place] [address], [place] [phone]  [phone]   [place] [phone] or [phone] (After Hours)

## 2015-02-03 NOTE — ED Notes (Signed)
Pt states that he has had R knee swelling w/ pain x 6 days. Tried ibuprofen at home. Hx of gout and arthritis in his knee. Alert and oriented.

## 2015-02-03 NOTE — ED Provider Notes (Signed)
CSN: [id number]     Arrival date & time 02/03/15  1538 History   First MD Initiated Contact with Patient 02/03/15 1545     Chief Complaint  Patient presents with  . Knee Pain     (Consider location/radiation/quality/duration/timing/severity/associated sxs/prior Treatment) HPI   Leon Nichols is a 49 y.o. male, presenting with right knee pain since 10/26. Pt denies an injury now or previously. Pt states he can not put weight on it. Rates pain 10/10, burning, and radiates up to mid thigh and down to mid calf. Ibuprofen takes away some of the pain, but does not relieve it completely. Pt denies infection near this knee, fever/chills, N/V, numbness/tingling, or any other pain or complaints.   Past Medical History  Diagnosis Date  . Hypertension   . Diverticulosis   . Hemorrhoids   . Perirectal abscess   . Colitis   . History of shingles   . HIV (human immunodeficiency virus infection) (HCC) 03/24/2012   Past Surgical History  Procedure Laterality Date  . Colonoscopy  08/31/2011    Procedure: COLONOSCOPY;  Surgeon: Hilarie Fredrickson, MD;  [facility];  Service: Endoscopy;  Laterality: N/A;  . Incision and drainage perirectal abscess  03/23/2012    Procedure: IRRIGATION AND DEBRIDEMENT PERIRECTAL ABSCESS;  Surgeon: Mariella Saa, MD;  Location: [facility] OR;  Service: General;  Laterality: N/A;  I & D soft tissue scrotal and perineal abscess  . Dressing change under anesthesia  03/27/2012    Procedure: DRESSING CHANGE UNDER ANESTHESIA;  Surgeon: Currie Paris, MD;  Location: [facility] OR;  Service: General;  Laterality: N/A;  . Laparoscopic diverted colostomy  03/27/2012    Procedure: LAPAROSCOPIC DIVERTED COLOSTOMY;  Surgeon: Currie Paris, MD;  [facility];  Service: General;  Laterality: N/A;   Family History  Problem Relation Age of Onset  . Hypertension Mother   . Diabetes Mother    Social History  Substance Use Topics  . Smoking status: Light Tobacco Smoker --  0.10 packs/day for 15 years    Types: Cigarettes  . Smokeless tobacco: Never Used     Comment: 1 cig a week  . Alcohol Use: Yes     Comment: rarely.    Review of Systems  Musculoskeletal:       Right knee pain and swelling  All other systems reviewed and are negative.     Allergies  Review of patient's allergies indicates no known allergies.  Home Medications   Prior to Admission medications   Medication Sig Start Date End Date Taking? Authorizing Provider  amLODipine (NORVASC) 10 MG tablet Take 1 tablet (10 mg total) by mouth daily. 04/25/14  Yes [name], MD  EPZICOM 600-300 MG per tablet TAKE ONE TABLET BY MOUTH EVERY DAY 12/20/14  Yes [name], MD  ibuprofen (ADVIL,MOTRIN) 200 MG tablet Take 800 mg by mouth every 6 (six) hours as needed for moderate pain.   Yes Historical Provider, MD  lisinopril (PRINIVIL,ZESTRIL) 40 MG tablet Take 1 tablet (40 mg total) by mouth daily. 04/25/14  Yes [name], MD  TIVICAY 50 MG tablet TAKE 1 TABLET BY MOUTH DAILY 12/20/14  Yes [name], MD  hydrochlorothiazide (HYDRODIURIL) 25 MG tablet TK 1 T PO DAILY AS NEEDED 12/31/14   Historical Provider, MD  Ostomy Supplies POUCH MISC 1 Container by Does not apply route daily. 02/04/14   Alexa Dulcy Fanny, MD   BP 162/97 mmHg  Pulse 107  Temp(Src) 100.2 F (37.9 C) (Oral)  Resp 20  Ht 6\' 1"  (1.854 m)  Wt 250 lb (113.399 kg)  BMI 32.99 kg/m2  SpO2 96% Physical Exam  Constitutional: He appears well-developed and well-nourished.  HENT:  Head: Normocephalic and atraumatic.  Cardiovascular: Regular rhythm.  Tachycardia present.   Musculoskeletal:  Right knee with sizeable effusion. Unable to test ROM due to pain. Feels somewhat warmer to touch than other knee. No signs of infection on the skin covering the knee, including no erythema.   Neurological: He is alert.  Skin: Skin is warm and dry.    ED Course  .Joint Aspiration/Arthrocentesis Date/Time: 02/03/2015 8:07  PM Performed by: Anselm PancoastJOY, SHAWN C Authorized by: Anselm PancoastJOY, SHAWN C Consent: Verbal consent obtained. Risks and benefits: risks, benefits and alternatives were discussed Consent given by: patient Patient understanding: patient states understanding of the procedure being performed Patient consent: the patient's understanding of the procedure matches consent given Procedure consent: procedure consent matches procedure scheduled Relevant documents: relevant documents present and verified Site marked: the operative site was marked Imaging studies: imaging studies available Patient identity confirmed: verbally with patient and arm band Time out: Immediately prior to procedure a "time out" was called to verify the correct patient, procedure, equipment, support staff and site/side marked as required. Indications: joint swelling  Body area: knee Joint: right knee Local anesthesia used: yes Anesthesia: local infiltration Local anesthetic: lidocaine 2% with epinephrine Anesthetic total: 2 ml Patient sedated: no Preparation: Patient was prepped and draped in the usual sterile fashion. Needle gauge: 18 G Ultrasound guidance: no Approach: medial Aspirate: yellow and cloudy Aspirate amount: 47 mL Patient tolerance: Patient tolerated the procedure well with no immediate complications   (including critical care time) Labs Review Labs Reviewed  BODY FLUID CULTURE  GRAM STAIN  SYNOVIAL CELL COUNT + DIFF, W/ CRYSTALS  GLUCOSE, SYNOVIAL FLUID  CBG MONITORING, ED    Imaging Review Dg Knee Complete 4 Views Right  02/03/2015  CLINICAL DATA:  Pain and swelling for 6 days.  No history of trauma EXAM: RIGHT KNEE - COMPLETE 4+ VIEW COMPARISON:  None. FINDINGS: Frontal, lateral, and bilateral oblique views were obtained. There is no demonstrable fracture or dislocation. There is a sizable joint effusion. There is moderate narrowing medially. There is spurring in the medial and lateral compartments as well as in  the intercondylar region. No erosive change. IMPRESSION: Osteoarthritic change, most notably medially. Sizable joint effusion. No erosive change. No fracture or dislocation. Electronically Signed   By: Bretta BangWilliam  Woodruff III M.D.   On: 02/03/2015 16:58   I have personally reviewed and evaluated these images and lab results as part of my medical decision-making.   EKG Interpretation None      MDM   Final diagnoses:  Right knee pain  Knee effusion, right    Leon Nichols presents with right knee pain and swelling.  Findings and plan of care discussed with Bethann BerkshireJoseph Zammit, MD.  Plan to order pain control, perform knee aspiration, apply knee immobilizer, crutches, and have pt follow up outpatient with ortho or the internal medicine clinic. Plan of care communicated with pt, who agreed to the plan. Joint aspiration successful. Pt to be discharged. Pt advises he will have a ride home from the ED. Tachycardia suspected due to pain.     Anselm PancoastShawn C Joy, PA-C 02/03/15 [address]2009  Shawn C Joy, [place]PA-C 02/03/15 2014  Bethann BerkshireJoseph Zammit, MD 02/03/15 [phone]

## 2015-02-04 LAB — GLUCOSE, SYNOVIAL FLUID

## 2015-02-05 ENCOUNTER — Encounter (HOSPITAL_COMMUNITY): Payer: Self-pay | Admitting: Emergency Medicine

## 2015-02-05 ENCOUNTER — Emergency Department (HOSPITAL_COMMUNITY): Payer: Medicare Other | Admitting: Certified Registered Nurse Anesthetist

## 2015-02-05 ENCOUNTER — Telehealth (HOSPITAL_COMMUNITY): Payer: Self-pay

## 2015-02-05 ENCOUNTER — Inpatient Hospital Stay (HOSPITAL_COMMUNITY)
Admission: EM | Admit: 2015-02-05 | Discharge: 2015-02-08 | DRG: 488 | Disposition: A | Discharge status: Home / Self Care | Payer: Medicare Other | Attending: Infectious Diseases | Admitting: Infectious Diseases

## 2015-02-05 ENCOUNTER — Encounter (HOSPITAL_COMMUNITY)
Admission: EM | Disposition: A | Discharge status: Home / Self Care | Payer: Self-pay | Source: Home / Self Care | Attending: Infectious Diseases

## 2015-02-05 DIAGNOSIS — B029 Zoster without complications: Secondary | ICD-10-CM | POA: Diagnosis not present

## 2015-02-05 DIAGNOSIS — M009 Pyogenic arthritis, unspecified: Principal | ICD-10-CM | POA: Diagnosis present

## 2015-02-05 DIAGNOSIS — I10 Essential (primary) hypertension: Secondary | ICD-10-CM | POA: Diagnosis present

## 2015-02-05 DIAGNOSIS — H9192 Unspecified hearing loss, left ear: Secondary | ICD-10-CM | POA: Diagnosis present

## 2015-02-05 DIAGNOSIS — K610 Anal abscess: Secondary | ICD-10-CM | POA: Diagnosis not present

## 2015-02-05 DIAGNOSIS — F17210 Nicotine dependence, cigarettes, uncomplicated: Secondary | ICD-10-CM | POA: Diagnosis present

## 2015-02-05 DIAGNOSIS — M00861 Arthritis due to other bacteria, right knee: Secondary | ICD-10-CM | POA: Diagnosis not present

## 2015-02-05 DIAGNOSIS — S83241A Other tear of medial meniscus, current injury, right knee, initial encounter: Secondary | ICD-10-CM | POA: Diagnosis not present

## 2015-02-05 DIAGNOSIS — K5790 Diverticulosis of intestine, part unspecified, without perforation or abscess without bleeding: Secondary | ICD-10-CM | POA: Diagnosis not present

## 2015-02-05 DIAGNOSIS — Z21 Asymptomatic human immunodeficiency virus [HIV] infection status: Secondary | ICD-10-CM | POA: Diagnosis present

## 2015-02-05 DIAGNOSIS — B20 Human immunodeficiency virus [HIV] disease: Secondary | ICD-10-CM | POA: Diagnosis present

## 2015-02-05 DIAGNOSIS — S83281A Other tear of lateral meniscus, current injury, right knee, initial encounter: Secondary | ICD-10-CM | POA: Diagnosis not present

## 2015-02-05 DIAGNOSIS — Z433 Encounter for attention to colostomy: Secondary | ICD-10-CM | POA: Diagnosis not present

## 2015-02-05 DIAGNOSIS — Z792 Long term (current) use of antibiotics: Secondary | ICD-10-CM | POA: Diagnosis not present

## 2015-02-05 DIAGNOSIS — K9419 Other complications of enterostomy: Secondary | ICD-10-CM | POA: Diagnosis present

## 2015-02-05 DIAGNOSIS — Z452 Encounter for adjustment and management of vascular access device: Secondary | ICD-10-CM | POA: Diagnosis not present

## 2015-02-05 DIAGNOSIS — Z933 Colostomy status: Secondary | ICD-10-CM | POA: Diagnosis not present

## 2015-02-05 DIAGNOSIS — B9689 Other specified bacterial agents as the cause of diseases classified elsewhere: Secondary | ICD-10-CM | POA: Diagnosis not present

## 2015-02-05 DIAGNOSIS — M2241 Chondromalacia patellae, right knee: Secondary | ICD-10-CM | POA: Diagnosis not present

## 2015-02-05 DIAGNOSIS — S83281D Other tear of lateral meniscus, current injury, right knee, subsequent encounter: Secondary | ICD-10-CM | POA: Diagnosis not present

## 2015-02-05 HISTORY — PX: KNEE ARTHROSCOPY: SHX127

## 2015-02-05 HISTORY — DX: Pyogenic arthritis, unspecified: M00.9

## 2015-02-05 LAB — BASIC METABOLIC PANEL
Anion gap: 12 (ref 5–15)
BUN: 25 mg/dL — AB (ref 6–20)
CALCIUM: 8.8 mg/dL — AB (ref 8.9–10.3)
CHLORIDE: 99 mmol/L — AB (ref 101–111)
CO2: 25 mmol/L (ref 22–32)
CREATININE: 1.04 mg/dL (ref 0.61–1.24)
GFR calc non Af Amer: >60 mL/min (ref >60)
Glucose, Bld: 157 mg/dL — ABNORMAL HIGH (ref 65–99)
Potassium: 3.3 mmol/L — ABNORMAL LOW (ref 3.5–5.1)
Sodium: 136 mmol/L (ref 135–145)

## 2015-02-05 LAB — CBC WITH DIFFERENTIAL/PLATELET
Basophils Absolute: 0 10*3/uL (ref 0.0–0.1)
Basophils Absolute: 0 10*3/uL (ref 0.0–0.1)
Basophils Relative: 0 %
Basophils Relative: 0 %
Eosinophils Absolute: 0 10*3/uL (ref 0.0–0.7)
Eosinophils Absolute: 0 10*3/uL (ref 0.0–0.7)
Eosinophils Relative: 0 %
Eosinophils Relative: 0 %
HCT: 41.9 % (ref 39.0–52.0)
HCT: 39.6 % (ref 39.0–52.0)
HEMOGLOBIN: 13.7 g/dL (ref 13.0–17.0)
Hemoglobin: 14.4 g/dL (ref 13.0–17.0)
LYMPHS ABS: 1.3 10*3/uL (ref 0.7–4.0)
Lymphocytes Relative: 13 %
Lymphocytes Relative: 12 %
Lymphs Abs: 1.4 10*3/uL (ref 0.7–4.0)
MCH: 32.7 pg (ref 26.0–34.0)
MCH: 33 pg (ref 26.0–34.0)
MCHC: 34.4 g/dL (ref 30.0–36.0)
MCHC: 34.6 g/dL (ref 30.0–36.0)
MCV: 95 fL (ref 78.0–100.0)
MCV: 95.4 fL (ref 78.0–100.0)
MONOS PCT: 13 %
Monocytes Absolute: 1.4 10*3/uL — ABNORMAL HIGH (ref 0.1–1.0)
Monocytes Absolute: 1.4 10*3/uL — ABNORMAL HIGH (ref 0.1–1.0)
Monocytes Relative: 13 %
Neutro Abs: 8.1 10*3/uL — ABNORMAL HIGH (ref 1.7–7.7)
Neutro Abs: 8 10*3/uL — ABNORMAL HIGH (ref 1.7–7.7)
Neutrophils Relative %: 74 %
Neutrophils Relative %: 75 %
Platelets: 471 10*3/uL — ABNORMAL HIGH (ref 150–400)
Platelets: 506 10*3/uL — ABNORMAL HIGH (ref 150–400)
RBC: 4.41 MIL/uL (ref 4.22–5.81)
RBC: 4.15 MIL/uL — AB (ref 4.22–5.81)
RDW: 15.5 % (ref 11.5–15.5)
RDW: 15.6 % — ABNORMAL HIGH (ref 11.5–15.5)
WBC: 10.9 10*3/uL — ABNORMAL HIGH (ref 4.0–10.5)
WBC: 10.7 10*3/uL — ABNORMAL HIGH (ref 4.0–10.5)

## 2015-02-05 SURGERY — ARTHROSCOPY, KNEE
Anesthesia: General | Laterality: Right

## 2015-02-05 MED ORDER — DOCUSATE SODIUM 100 MG PO CAPS
100.0000 mg | ORAL_CAPSULE | Freq: Two times a day (BID) | ORAL | Status: DC
  Administered 2015-02-06 – 2015-02-08 (×6): 100 mg via ORAL
  Filled 2015-02-05 (×6): qty 1

## 2015-02-05 MED ORDER — SODIUM CHLORIDE 0.9 % IR SOLN
Status: DC | PRN
  Administered 2015-02-05: 3000 mL

## 2015-02-05 MED ORDER — HYDROMORPHONE HCL 1 MG/ML IJ SOLN
0.2500 mg | INTRAMUSCULAR | Status: DC | PRN
  Administered 2015-02-05 (×4): 0.5 mg via INTRAVENOUS

## 2015-02-05 MED ORDER — ONDANSETRON HCL 4 MG/2ML IJ SOLN: 4.0000 mg | Freq: Four times a day (QID) | INTRAMUSCULAR | Status: DC | PRN

## 2015-02-05 MED ORDER — MIDAZOLAM HCL 2 MG/2ML IJ SOLN
INTRAMUSCULAR | Status: AC
  Filled 2015-02-05: qty 2

## 2015-02-05 MED ORDER — ONDANSETRON HCL 4 MG/2ML IJ SOLN
INTRAMUSCULAR | Status: AC
  Filled 2015-02-05: qty 2

## 2015-02-05 MED ORDER — ONDANSETRON HCL 4 MG/2ML IJ SOLN
INTRAMUSCULAR | Status: DC | PRN
  Administered 2015-02-05: 4 mg via INTRAVENOUS

## 2015-02-05 MED ORDER — POLYETHYLENE GLYCOL 3350 17 G PO PACK
17.0000 g | PACK | Freq: Every day | ORAL | Status: DC | PRN
  Administered 2015-02-07: 17 g via ORAL
  Filled 2015-02-05: qty 1

## 2015-02-05 MED ORDER — HEPARIN SODIUM (PORCINE) 5000 UNIT/ML IJ SOLN
5000.0000 [IU] | Freq: Three times a day (TID) | INTRAMUSCULAR | Status: DC
  Administered 2015-02-06 – 2015-02-08 (×8): 5000 [IU] via SUBCUTANEOUS
  Filled 2015-02-05 (×8): qty 1

## 2015-02-05 MED ORDER — MEPERIDINE HCL 25 MG/ML IJ SOLN: 6.2500 mg | INTRAMUSCULAR | Status: DC | PRN

## 2015-02-05 MED ORDER — SODIUM CHLORIDE 0.9 % IV SOLN
INTRAVENOUS | Status: DC
  Administered 2015-02-06 (×2): via INTRAVENOUS

## 2015-02-05 MED ORDER — LACTATED RINGERS IV SOLN
INTRAVENOUS | Status: DC | PRN
  Administered 2015-02-05 (×2): via INTRAVENOUS

## 2015-02-05 MED ORDER — PROPOFOL 10 MG/ML IV BOLUS
INTRAVENOUS | Status: DC | PRN
  Administered 2015-02-05: 160 mg via INTRAVENOUS

## 2015-02-05 MED ORDER — METOCLOPRAMIDE HCL 5 MG/ML IJ SOLN
5.0000 mg | Freq: Three times a day (TID) | INTRAMUSCULAR | Status: DC | PRN
Start: 2015-02-05 — End: 2015-02-08

## 2015-02-05 MED ORDER — PROPOFOL 10 MG/ML IV BOLUS
INTRAVENOUS | Status: AC
  Filled 2015-02-05: qty 40

## 2015-02-05 MED ORDER — MIDAZOLAM HCL 5 MG/5ML IJ SOLN
INTRAMUSCULAR | Status: DC | PRN
  Administered 2015-02-05: 2 mg via INTRAVENOUS

## 2015-02-05 MED ORDER — HYDROMORPHONE HCL 1 MG/ML IJ SOLN
1.0000 mg | INTRAMUSCULAR | Status: DC | PRN
  Administered 2015-02-06 – 2015-02-07 (×8): 1 mg via INTRAVENOUS
  Filled 2015-02-05 (×8): qty 1

## 2015-02-05 MED ORDER — OXYCODONE-ACETAMINOPHEN 5-325 MG PO TABS: 1.0000 | ORAL_TABLET | ORAL | Status: DC | PRN

## 2015-02-05 MED ORDER — ACETAMINOPHEN 650 MG RE SUPP: 650.0000 mg | Freq: Four times a day (QID) | RECTAL | Status: DC | PRN

## 2015-02-05 MED ORDER — MORPHINE SULFATE (PF) 4 MG/ML IV SOLN
6.0000 mg | Freq: Once | INTRAVENOUS | Status: AC
  Administered 2015-02-05: 6 mg via INTRAVENOUS
  Filled 2015-02-05: qty 2

## 2015-02-05 MED ORDER — BUPIVACAINE HCL (PF) 0.25 % IJ SOLN
INTRAMUSCULAR | Status: DC | PRN
  Administered 2015-02-05: 20 mL

## 2015-02-05 MED ORDER — FENTANYL CITRATE (PF) 250 MCG/5ML IJ SOLN
INTRAMUSCULAR | Status: AC
  Filled 2015-02-05: qty 5

## 2015-02-05 MED ORDER — LIDOCAINE HCL (CARDIAC) 20 MG/ML IV SOLN
INTRAVENOUS | Status: DC | PRN
  Administered 2015-02-05: 100 mg via INTRAVENOUS

## 2015-02-05 MED ORDER — DEXTROSE 5 % IV SOLN
2.0000 g | INTRAVENOUS | Status: DC
  Administered 2015-02-06 – 2015-02-07 (×2): 2 g via INTRAVENOUS
  Filled 2015-02-05 (×3): qty 2

## 2015-02-05 MED ORDER — ONDANSETRON HCL 4 MG PO TABS: 4.0000 mg | ORAL_TABLET | Freq: Four times a day (QID) | ORAL | Status: DC | PRN

## 2015-02-05 MED ORDER — ACETAMINOPHEN 325 MG PO TABS
650.0000 mg | ORAL_TABLET | Freq: Four times a day (QID) | ORAL | Status: DC | PRN
  Administered 2015-02-08: 650 mg via ORAL
  Filled 2015-02-05: qty 2

## 2015-02-05 MED ORDER — MORPHINE SULFATE (PF) 4 MG/ML IV SOLN: 4.0000 mg | INTRAVENOUS | Status: DC | PRN

## 2015-02-05 MED ORDER — DEXTROSE 5 % IV SOLN
2.0000 g | INTRAVENOUS | Status: AC
  Administered 2015-02-05: 2 g via INTRAVENOUS
  Filled 2015-02-05: qty 2

## 2015-02-05 MED ORDER — ONDANSETRON HCL 4 MG/2ML IJ SOLN: 4.0000 mg | Freq: Once | INTRAMUSCULAR | Status: DC | PRN

## 2015-02-05 MED ORDER — FENTANYL CITRATE (PF) 100 MCG/2ML IJ SOLN
INTRAMUSCULAR | Status: DC | PRN
  Administered 2015-02-05 (×6): 50 ug via INTRAVENOUS

## 2015-02-05 MED ORDER — METOCLOPRAMIDE HCL 5 MG PO TABS: 5.0000 mg | ORAL_TABLET | Freq: Three times a day (TID) | ORAL | Status: DC | PRN

## 2015-02-05 MED ORDER — LIDOCAINE HCL (CARDIAC) 20 MG/ML IV SOLN
INTRAVENOUS | Status: AC
  Filled 2015-02-05: qty 5

## 2015-02-05 MED ORDER — CEFAZOLIN SODIUM-DEXTROSE 2-3 GM-% IV SOLR
INTRAVENOUS | Status: AC
  Filled 2015-02-05: qty 50

## 2015-02-05 MED ORDER — CEFAZOLIN SODIUM-DEXTROSE 2-3 GM-% IV SOLR
INTRAVENOUS | Status: DC | PRN
  Administered 2015-02-05: 2 g via INTRAVENOUS

## 2015-02-05 MED ORDER — SUCCINYLCHOLINE CHLORIDE 20 MG/ML IJ SOLN
INTRAMUSCULAR | Status: AC
  Filled 2015-02-05: qty 1

## 2015-02-05 MED ORDER — OXYCODONE HCL 5 MG PO TABS
5.0000 mg | ORAL_TABLET | ORAL | Status: DC | PRN
  Administered 2015-02-06 – 2015-02-08 (×17): 10 mg via ORAL
  Filled 2015-02-05 (×18): qty 2

## 2015-02-05 SURGICAL SUPPLY — 43 items
BANDAGE ELASTIC 6 VELCRO ST LF (GAUZE/BANDAGES/DRESSINGS) ×2 IMPLANT
BANDAGE ESMARK 6X9 LF (GAUZE/BANDAGES/DRESSINGS) IMPLANT
BLADE CUTTER GATOR 3.5 (BLADE) ×2 IMPLANT
BLADE GREAT WHITE 4.2 (BLADE) ×2 IMPLANT
BLADE SURG ROTATE 9660 (MISCELLANEOUS) IMPLANT
BNDG CMPR 9X6 STRL LF SNTH (GAUZE/BANDAGES/DRESSINGS)
BNDG ESMARK 6X9 LF (GAUZE/BANDAGES/DRESSINGS)
COVER SURGICAL LIGHT HANDLE (MISCELLANEOUS) ×2 IMPLANT
CUFF TOURNIQUET SINGLE 34IN LL (TOURNIQUET CUFF) IMPLANT
CUTTER MENISCUS 3.5MM 6/BX (BLADE) ×2 IMPLANT
DRAPE ARTHROSCOPY W/POUCH 114 (DRAPES) ×2 IMPLANT
DRAPE U-SHAPE 47X51 STRL (DRAPES) ×2 IMPLANT
DRSG PAD ABDOMINAL 8X10 ST (GAUZE/BANDAGES/DRESSINGS) ×2 IMPLANT
FACESHIELD WRAPAROUND (MASK) ×2 IMPLANT
GAUZE SPONGE 4X4 12PLY STRL (GAUZE/BANDAGES/DRESSINGS) IMPLANT
GAUZE XEROFORM 1X8 LF (GAUZE/BANDAGES/DRESSINGS) ×2 IMPLANT
GLOVE BIO SURGEON STRL SZ7 (GLOVE) ×2 IMPLANT
GLOVE BIO SURGEON STRL SZ7.5 (GLOVE) ×2 IMPLANT
GLOVE BIOGEL PI IND STRL 7.0 (GLOVE) ×1 IMPLANT
GLOVE BIOGEL PI IND STRL 8 (GLOVE) ×1 IMPLANT
GLOVE BIOGEL PI INDICATOR 7.0 (GLOVE) ×1
GLOVE BIOGEL PI INDICATOR 8 (GLOVE) ×1
GOWN STRL REUS W/ TWL LRG LVL3 (GOWN DISPOSABLE) ×2 IMPLANT
GOWN STRL REUS W/ TWL XL LVL3 (GOWN DISPOSABLE) ×1 IMPLANT
GOWN STRL REUS W/TWL LRG LVL3 (GOWN DISPOSABLE) ×4
GOWN STRL REUS W/TWL XL LVL3 (GOWN DISPOSABLE) ×2
KIT ROOM TURNOVER OR (KITS) ×2 IMPLANT
MANIFOLD NEPTUNE II (INSTRUMENTS) IMPLANT
NS IRRIG 1000ML POUR BTL (IV SOLUTION) IMPLANT
PACK ARTHROSCOPY DSU (CUSTOM PROCEDURE TRAY) ×2 IMPLANT
PAD ARMBOARD 7.5X6 YLW CONV (MISCELLANEOUS) ×4 IMPLANT
PADDING CAST COTTON 6X4 STRL (CAST SUPPLIES) IMPLANT
PADDING CAST SYNTHETIC 4 (CAST SUPPLIES) ×1
PADDING CAST SYNTHETIC 4X4 STR (CAST SUPPLIES) ×1 IMPLANT
SET ARTHROSCOPY TUBING (MISCELLANEOUS) ×2
SET ARTHROSCOPY TUBING LN (MISCELLANEOUS) ×1 IMPLANT
SPONGE GAUZE 4X4 12PLY STER LF (GAUZE/BANDAGES/DRESSINGS) ×2 IMPLANT
SPONGE LAP 4X18 X RAY DECT (DISPOSABLE) ×2 IMPLANT
SUT ETHILON 2 0 FS 18 (SUTURE) IMPLANT
TOWEL OR 17X24 6PK STRL BLUE (TOWEL DISPOSABLE) ×2 IMPLANT
TOWEL OR 17X26 10 PK STRL BLUE (TOWEL DISPOSABLE) ×2 IMPLANT
TUBE CONNECTING 12X1/4 (SUCTIONS) ×2 IMPLANT
WATER STERILE IRR 1000ML POUR (IV SOLUTION) ×2 IMPLANT

## 2015-02-05 NOTE — Anesthesia Procedure Notes (Signed)
Date/Time: 02/05/2015 9:46 PM Performed by: Arlice ColtMANESS, Trusten Hume B Pre-anesthesia Checklist: Patient identified, Emergency Drugs available, Suction available, Patient being monitored and Timeout performed Patient Re-evaluated:Patient Re-evaluated prior to inductionOxygen Delivery Method: Circle system utilized Preoxygenation: Pre-oxygenation with 100% oxygen Intubation Type: IV induction LMA: LMA inserted LMA Size: 5.0 Number of attempts: 1 Placement Confirmation: positive ETCO2 and breath sounds checked- equal and bilateral Tube secured with: Tape Dental Injury: Teeth and Oropharynx as per pre-operative assessment

## 2015-02-05 NOTE — H&P (View-Only) (Signed)
   ORTHOPAEDIC CONSULTATION  REQUESTING PHYSICIAN: Jeffrey C Hatcher, MD  Chief Complaint: R knee pain  HPI: Leon Nichols is a 49 y.o. male who complains of R knee pain starting on 10/26 without injury or trauma.  Patient was evaluated in the ED on 10/31.  A knee aspiration was performed and fluid sent for analysis.  The patient was discharged with crutches and a knee immobilizer.  He as to follow up with ortho as outpatient.  The patient was then notified today that his joint fluid was positive for infection and to return to the ED.  The patient has been using ibuprofen for pain control.  The knee has continued to remain painful and swollen since evaluation on 10/31.  He also reports intermittent fevers subjectively.  WBC was 10.9.  Joint fluid had 128,500 WBC. He has a history of IV drug use and is positive for HIV.  He is followed by infectious disease.    Past Medical History  Diagnosis Date  . Hypertension   . Diverticulosis   . Hemorrhoids   . Perirectal abscess   . Colitis   . History of shingles   . HIV (human immunodeficiency virus infection) (HCC) 03/24/2012   Past Surgical History  Procedure Laterality Date  . Colonoscopy  08/31/2011    Procedure: COLONOSCOPY;  Surgeon: John N Perry, MD;  Location: [facility];  Service: Endoscopy;  Laterality: N/A;  . Incision and drainage perirectal abscess  03/23/2012    Procedure: IRRIGATION AND DEBRIDEMENT PERIRECTAL ABSCESS;  Surgeon: Benjamin T Hoxworth, MD;  Location: [facility] OR;  Service: General;  Laterality: N/A;  I & D soft tissue scrotal and perineal abscess  . Dressing change under anesthesia  03/27/2012    Procedure: DRESSING CHANGE UNDER ANESTHESIA;  Surgeon: Christian J Streck, MD;  Location: [facility];  Service: General;  Laterality: N/A;  . Laparoscopic diverted colostomy  03/27/2012    Procedure: LAPAROSCOPIC DIVERTED COLOSTOMY;  Surgeon: Christian J Streck, MD;  Location: [facility];  Service: General;  Laterality: N/A;   Social  History   Social History  . Marital Status: Single    Spouse Name: N/A  . Number of Children: N/A  . Years of Education: N/A   Social History Main Topics  . Smoking status: Light Tobacco Smoker -- 0.10 packs/day for 15 years    Types: Cigarettes  . Smokeless tobacco: Never Used     Comment: 1 cig a week  . Alcohol Use: Yes     Comment: rarely.  . Drug Use: No  . Sexual Activity: Not Asked   Other Topics Concern  . None   Social History Narrative   Family History  Problem Relation Age of Onset  . Hypertension Mother   . Diabetes Mother    No Known Allergies Prior to Admission medications   Medication Sig Start Date End Date Taking? Authorizing Provider  amLODipine (NORVASC) 10 MG tablet Take 1 tablet (10 mg total) by mouth daily. 04/25/14  Yes Cynthia Snider, MD  EPZICOM 600-300 MG per tablet TAKE ONE TABLET BY MOUTH EVERY DAY 12/20/14  Yes Cynthia Snider, MD  ibuprofen (ADVIL,MOTRIN) 200 MG tablet Take 800 mg by mouth every 4 (four) hours as needed for headache or moderate pain.   Yes Historical Provider, MD  lisinopril (PRINIVIL,ZESTRIL) 40 MG tablet Take 1 tablet (40 mg total) by mouth daily. 04/25/14  Yes Cynthia Snider, MD  naproxen sodium (ANAPROX) 220 MG tablet Take 660 mg by mouth daily as needed (  pain).   Yes Historical Provider, MD  TIVICAY 50 MG tablet TAKE 1 TABLET BY MOUTH DAILY 12/20/14  Yes Cynthia Snider, MD  ibuprofen (ADVIL,MOTRIN) 800 MG tablet Take 1 tablet (800 mg total) by mouth 3 (three) times daily. 02/03/15   Shawn C Joy, PA-C  Ostomy Supplies POUCH MISC 1 Container by Does not apply route daily. 02/04/14   Alexa M Richardson, MD   No results found.  Positive ROS: All other systems have been reviewed and were otherwise negative with the exception of those mentioned in the HPI and as above.  Labs cbc  Recent Labs  02/05/15 1834  WBC 10.9*  HGB 14.4  HCT 41.9  PLT 471*    Labs inflam No results for input(s): CRP in the last 72 hours.  Invalid  input(s): ESR  Labs coag No results for input(s): INR, PTT in the last 72 hours.  Invalid input(s): PT  No results for input(s): NA, K, CL, CO2, GLUCOSE, BUN, CREATININE, CALCIUM in the last 72 hours.  Physical Exam: Filed Vitals:   02/05/15 1747  BP: 121/80  Pulse: 109  Temp: 98.2 F (36.8 C)  Resp: 16   General: Alert, no acute distress Cardiovascular: No pedal edema Respiratory: No cyanosis, no use of accessory musculature GI: No organomegaly, abdomen is soft and non-tender Skin: No lesions in the area of chief complaint other than those listed below in MSK exam.  Neurologic: Sensation intact distally Psychiatric: Patient is competent for consent with normal mood and affect Lymphatic: No axillary or cervical lymphadenopathy  MUSCULOSKELETAL:  R knee is swollen.  Very painful to palpation.  Limited ROM due to pain and swelling.  Sensation intact with 2+ distal pulses.  Other extremities are atraumatic with painless ROM and NVI.  Assessment: R septic knee  Plan: Joint aspiration reveals septic R knee.  Recommending surgical irrigation and debridement.  Will consult ID for abx management.  Preliminary results of culture of joint fluid revealed no growth.  Re-incubated with results pending.  Will send sample intra-op as well.  Patient will remain NPO for surgery.   Zamirah Denny Marie, PA-C Cell [phone]   02/05/2015 7:12 PM  

## 2015-02-05 NOTE — ED Notes (Signed)
Pt refused to change into gown [facility]

## 2015-02-05 NOTE — Consult Note (Signed)
ORTHOPAEDIC CONSULTATION  REQUESTING PHYSICIAN: [name], MD  Chief Complaint: R knee pain  HPI: Leon Nichols is a 49 y.o. male who complains of R knee pain starting on 10/26 without injury or trauma.  Patient was evaluated in the ED on 10/31.  A knee aspiration was performed and fluid sent for analysis.  The patient was discharged with crutches and a knee immobilizer.  He as to follow up with ortho as outpatient.  The patient was then notified today that his joint fluid was positive for infection and to return to the ED.  The patient has been using ibuprofen for pain control.  The knee has continued to remain painful and swollen since evaluation on 10/31.  He also reports intermittent fevers subjectively.  WBC was 10.9.  Joint fluid had 128,500 WBC. He has a history of IV drug use and is positive for HIV.  He is followed by infectious disease.    Past Medical History  Diagnosis Date  . Hypertension   . Diverticulosis   . Hemorrhoids   . Perirectal abscess   . Colitis   . History of shingles   . HIV (human immunodeficiency virus infection) ([place]) 03/24/2012   Past Surgical History  Procedure Laterality Date  . Colonoscopy  08/31/2011    Procedure: COLONOSCOPY;  Surgeon: [name], MD;  Location: [facility];  Service: Endoscopy;  Laterality: N/A;  . Incision and drainage perirectal abscess  03/23/2012    Procedure: IRRIGATION AND DEBRIDEMENT PERIRECTAL ABSCESS;  Surgeon: [name], MD;  Location: [facility];  Service: General;  Laterality: N/A;  I & D soft tissue scrotal and perineal abscess  . Dressing change under anesthesia  03/27/2012    Procedure: DRESSING CHANGE UNDER ANESTHESIA;  Surgeon: [name], MD;  Location: [facility];  Service: General;  Laterality: N/A;  . Laparoscopic diverted colostomy  03/27/2012    Procedure: LAPAROSCOPIC DIVERTED COLOSTOMY;  Surgeon: [name], MD;  Location: [facility] OR;  Service: General;  Laterality: N/A;   Social  History   Social History  . Marital Status: Single    Spouse Name: N/A  . Number of Children: N/A  . Years of Education: N/A   Social History Main Topics  . Smoking status: Light Tobacco Smoker -- 0.10 packs/day for 15 years    Types: Cigarettes  . Smokeless tobacco: Never Used     Comment: 1 cig a week  . Alcohol Use: Yes     Comment: rarely.  . Drug Use: No  . Sexual Activity: Not Asked   Other Topics Concern  . None   Social History Narrative   Family History  Problem Relation Age of Onset  . Hypertension Mother   . Diabetes Mother    No Known Allergies Prior to Admission medications   Medication Sig Start Date End Date Taking? Authorizing Provider  amLODipine (NORVASC) 10 MG tablet Take 1 tablet (10 mg total) by mouth daily. 04/25/14  Yes [name], MD  EPZICOM 600-300 MG per tablet TAKE ONE TABLET BY MOUTH EVERY DAY 12/20/14  Yes [name], MD  ibuprofen (ADVIL,MOTRIN) 200 MG tablet Take 800 mg by mouth every 4 (four) hours as needed for headache or moderate pain.   Yes Historical Provider, MD  lisinopril (PRINIVIL,ZESTRIL) 40 MG tablet Take 1 tablet (40 mg total) by mouth daily. 04/25/14  Yes [name], MD  naproxen sodium (ANAPROX) 220 MG tablet Take 660 mg by mouth daily as needed (  pain).   Yes Historical Provider, MD  TIVICAY 50 MG tablet TAKE 1 TABLET BY MOUTH DAILY 12/20/14  Yes [name], MD  ibuprofen (ADVIL,MOTRIN) 800 MG tablet Take 1 tablet (800 mg total) by mouth 3 (three) times daily. 02/03/15   [name], PA-C  Ostomy Supplies POUCH MISC 1 Container by Does not apply route daily. 02/04/14   [name], MD   No results found.  Positive ROS: All other systems have been reviewed and were otherwise negative with the exception of those mentioned in the HPI and as above.  Labs cbc  Recent Labs  02/05/15 1834  WBC 10.9*  HGB 14.4  HCT 41.9  PLT 471*    Labs inflam No results for input(s): CRP in the last 72 hours.  Invalid  input(s): ESR  Labs coag No results for input(s): INR, PTT in the last 72 hours.  Invalid input(s): PT  No results for input(s): NA, K, CL, CO2, GLUCOSE, BUN, CREATININE, CALCIUM in the last 72 hours.  Physical Exam: Filed Vitals:   02/05/15 1747  BP: 121/80  Pulse: 109  Temp: 98.2 F (36.8 C)  Resp: 16   General: Alert, no acute distress Cardiovascular: No pedal edema Respiratory: No cyanosis, no use of accessory musculature GI: No organomegaly, abdomen is soft and non-tender Skin: No lesions in the area of chief complaint other than those listed below in MSK exam.  Neurologic: Sensation intact distally Psychiatric: Patient is competent for consent with normal mood and affect Lymphatic: No axillary or cervical lymphadenopathy  MUSCULOSKELETAL:  R knee is swollen.  Very painful to palpation.  Limited ROM due to pain and swelling.  Sensation intact with 2+ distal pulses.  Other extremities are atraumatic with painless ROM and NVI.  Assessment: R septic knee  Plan: Joint aspiration reveals septic R knee.  Recommending surgical irrigation and debridement.  Will consult ID for abx management.  Preliminary results of culture of joint fluid revealed no growth.  Re-incubated with results pending.  Will send sample intra-op as well.  Patient will remain NPO for surgery.   Bland Span Cell [phone]   02/05/2015 7:12 PM

## 2015-02-05 NOTE — ED Provider Notes (Signed)
CSN: [id number]     Arrival date & time 02/05/15  1737 History   First MD Initiated Contact with Patient 02/05/15 1755     Chief Complaint  Patient presents with  . Knee Pain  . Knee Infected      (Consider location/radiation/quality/duration/timing/severity/associated sxs/prior Treatment) HPI   49 year old male with atraumatic right knee pain. Symptom onset on October 26. Patient was seen in the emergency room on 10/31. Had arthrocentesis done at time. Called today when cultures subsequently came back positive. Since last evaluation, he reports continued knee pain and swelling. Worse with any movement. Intermittent subjective fever but has been taking ibuprofen. No other acute complaints. Denies prior history of significant problems with this knee or prior procedures on it. Denies history of IV drug use. Is HIV-positive. Followed by ID. Reports compliance of this medications. Last labs I can see are from 04/2014 and CD4 770 / VL 46 at that time.  Past Medical History  Diagnosis Date  . Hypertension   . Diverticulosis   . Hemorrhoids   . Perirectal abscess   . Colitis   . History of shingles   . HIV (human immunodeficiency virus infection) (HCC) 03/24/2012   Past Surgical History  Procedure Laterality Date  . Colonoscopy  08/31/2011    Procedure: COLONOSCOPY;  Surgeon: Hilarie FredricksonJohn N Perry, MD;  [facility];  Service: Endoscopy;  Laterality: N/A;  . Incision and drainage perirectal abscess  03/23/2012    Procedure: IRRIGATION AND DEBRIDEMENT PERIRECTAL ABSCESS;  Surgeon: Mariella SaaBenjamin T Hoxworth, MD;  Location: [facility] OR;  Service: General;  Laterality: N/A;  I & D soft tissue scrotal and perineal abscess  . Dressing change under anesthesia  03/27/2012    Procedure: DRESSING CHANGE UNDER ANESTHESIA;  Surgeon: Currie Parishristian J Streck, MD;  Location: [facility] OR;  Service: General;  Laterality: N/A;  . Laparoscopic diverted colostomy  03/27/2012    Procedure: LAPAROSCOPIC DIVERTED COLOSTOMY;  Surgeon:  Currie Parishristian J Streck, MD;  [facility];  Service: General;  Laterality: N/A;   Family History  Problem Relation Age of Onset  . Hypertension Mother   . Diabetes Mother    Social History  Substance Use Topics  . Smoking status: Light Tobacco Smoker -- 0.10 packs/day for 15 years    Types: Cigarettes  . Smokeless tobacco: Never Used     Comment: 1 cig a week  . Alcohol Use: Yes     Comment: rarely.    Review of Systems  All systems reviewed and negative, other than as noted in HPI.   Allergies  Review of patient's allergies indicates no known allergies.  Home Medications   Prior to Admission medications   Medication Sig Start Date End Date Taking? Authorizing Provider  amLODipine (NORVASC) 10 MG tablet Take 1 tablet (10 mg total) by mouth daily. 04/25/14  Yes Judyann Munsonynthia Snider, MD  EPZICOM 600-300 MG per tablet TAKE ONE TABLET BY MOUTH EVERY DAY 12/20/14  Yes Judyann Munsonynthia Snider, MD  ibuprofen (ADVIL,MOTRIN) 200 MG tablet Take 800 mg by mouth every 4 (four) hours as needed for headache or moderate pain.   Yes Historical Provider, MD  lisinopril (PRINIVIL,ZESTRIL) 40 MG tablet Take 1 tablet (40 mg total) by mouth daily. 04/25/14  Yes Judyann Munsonynthia Snider, MD  naproxen sodium (ANAPROX) 220 MG tablet Take 660 mg by mouth daily as needed (pain).   Yes Historical Provider, MD  TIVICAY 50 MG tablet TAKE 1 TABLET BY MOUTH DAILY 12/20/14  Yes Judyann Munsonynthia Snider, MD  ibuprofen (ADVIL,MOTRIN) 800  MG tablet Take 1 tablet (800 mg total) by mouth 3 (three) times daily. 02/03/15   Anselm Pancoast, PA-C  Ostomy Supplies POUCH MISC 1 Container by Does not apply route daily. 02/04/14   Alexa Dulcy Fanny, MD   BP 121/80 mmHg  Pulse 109  Temp(Src) 98.2 F (36.8 C) (Oral)  Resp 16  Ht  (1.854 m)  Wt 250 lb (113.399 kg)  BMI 32.99 kg/m2  SpO2 100% Physical Exam  Constitutional: He appears well-developed and well-nourished. No distress.  HENT:  Head: Normocephalic and atraumatic.  Eyes: Conjunctivae are  normal. Right eye exhibits no discharge. Left eye exhibits no discharge.  Neck: Neck supple.  Cardiovascular: Regular rhythm and normal heart sounds.  Exam reveals no gallop and no friction rub.   No murmur heard. Mild tachycardia  Pulmonary/Chest: Effort normal and breath sounds normal. No respiratory distress.  Abdominal: Soft. He exhibits no distension. There is no tenderness.  Musculoskeletal: He exhibits no edema or tenderness.  R knee effusion. Warm to touch. No significant overlying skin changes otherwise. Very limited ROM with both passive and active ROM because of pain. NVI distally.   Neurological: He is alert.  Skin: Skin is warm and dry.  Psychiatric: He has a normal mood and affect. His behavior is normal. Thought content normal.  Nursing note and vitals reviewed.   ED Course  Procedures (including critical care time) Labs Review Labs Reviewed  CBC WITH DIFFERENTIAL/PLATELET - Abnormal; Notable for the following:    WBC 10.9 (*)    Platelets 471 (*)    Neutro Abs 8.1 (*)    Monocytes Absolute 1.4 (*)    All other components within normal limits  CULTURE, BLOOD (ROUTINE X 2)  CULTURE, BLOOD (ROUTINE X 2)  BASIC METABOLIC PANEL    Imaging Review No results found. I have personally reviewed and evaluated these images and lab results as part of my medical decision-making.   EKG Interpretation None      MDM   Final diagnoses:  Pyogenic arthritis of right knee joint, due to unspecified organism [facility])   49yM with atraumatic R knee pain/swelling. Arthrocentesis on ED visit 10/31. Cultures with gram - coccobacilli. Discussed with Dr Wandra Feinstein. Requesting transfer to [facility] ED. Likely washout tonight and ultimately medicine admission. Discussed with Dr Orvan Falconer, on call for ID. Recommending rocephin for initial abx choice.   IM Teaching Service patient. Discussed with them for admission. Dr Ninetta Lights attending. Discussed with Dr Jeraldine Loots, EDP, that patient will be coming  to ED initially.     Raeford Razor, MD 02/05/15 Wind[place] Moment

## 2015-02-05 NOTE — Op Note (Signed)
02/05/2015  10:41 PM  PATIENT:  Leon Nichols    PRE-OPERATIVE DIAGNOSIS:  SEPTIC  RIGHT KNEE  POST-OPERATIVE DIAGNOSIS:  Same  PROCEDURE:  ARTHROSCOPIC WASH OUT RIGHT KNEE  SURGEON:  Antoinette Haskett, Jewel BaizeIMOTHY D, MD  ASSISTANT: Janalee DaneBrittney Kelly, PA-C, She was present and scrubbed throughout the case, critical for completion in a timely fashion, and for retraction, instrumentation, and closure.   ANESTHESIA:   gen  PREOPERATIVE INDICATIONS:  Leon Nichols is a  49 y.o. male with a diagnosis of SEPTIC  RIGHT KNEE who failed conservative measures and elected for surgical management.    The risks benefits and alternatives were discussed with the patient preoperatively including but not limited to the risks of infection, bleeding, nerve injury, cardiopulmonary complications, the need for revision surgery, among others, and the patient was willing to proceed.  OPERATIVE IMPLANTS: none  OPERATIVE FINDINGS: medial/lateral meniscal tear and grade 2/3 chondral damage  BLOOD LOSS: min  COMPLICATIONS: none  TOURNIQUET TIME: 5min  OPERATIVE PROCEDURE:  Patient was identified in the preoperative holding area and site was marked by me He was transported to the operating theater and placed on the table in supine position taking care to pad all bony prominences. After a preincinduction time out anesthesia was induced. The right lower extremity was prepped and draped in normal sterile fashion and a pre-incision timeout was performed. He received ancef for preoperative antibiotics.   I created both an inferior medial and lateral arthroscopic portals. Fluid was collected from this and sent to the lab for culture. I inserted the arthroscope and started with the extensive washout. In total 6 L of saline was irrigated through his knee.  I created a superior lateral outflow portal.  Exam his patellofemoral space there was grade 2 outer bridge changes to the patellofemoral space I performed a chondroplasty  here.  I debrided synovium as well there is hypertrophied.  I then lifted as not she had what appeared to be a partial sprain to his anterior cruciate ligament there were several fibers intact. No apparent onto the PCL.  I then examined his lateral space he had grade 3 changes to his cartilage here as well as multiple frayed meniscal tears are performed a partial lateral meniscectomy using a shaver as well as a chondroplasty here as well. On the medial side he had grade 3 and 4 changes to his cartilage and severely denuded meniscus I performed a partial meniscectomy here of a radial tear. I performed a mild chondroplasty left with a stable meniscal meniscus is much as possible. I then completed irrigation for a total 6 L removed all arthroscopic equipment and injected Marcaine 20 mL plain.  His arthroscopic portals were closed with a nylon stitch sterile dressings were applied his taken the PACU in stable condition after being awoken.  POST OPERATIVE PLAN: Antibiotics per infectious disease consult weightbearing as tolerated ambulate for DVT prophylaxis no orthopedic contraindication to chemical prophylaxis per the teaching medicine service.    This note was generated using a template and dragon dictation system. In light of that, I have reviewed the note and all aspects of it are applicable to this case. Any dictation errors are due to the computerized dictation system.

## 2015-02-05 NOTE — Transfer of Care (Signed)
Immediate Anesthesia Transfer of Care Note  Patient: Leon Nichols  Procedure(s) Performed: Procedure(s): ARTHROSCOPIC WASH OUT RIGHT KNEE (Right)  Patient Location: PACU  Anesthesia Type:General  Level of Consciousness: awake, alert  and oriented  Airway & Oxygen Therapy: Patient Spontanous Breathing  Post-op Assessment: Report given to RN and Post -op Vital signs reviewed and stable  Post vital signs: Reviewed and stable  Last Vitals:  Filed Vitals:   02/05/15 2102  BP: 145/89  Pulse: 101  Temp: 37.2 C  Resp: 17    Complications: No apparent anesthesia complications

## 2015-02-05 NOTE — Telephone Encounter (Signed)
Call rcvd from lab.  Reincubated preliminary synovial fluid culture growing "gram (-) coccobaccilli"   Chart reviewed by Henry RusselLesia Tapia PA "Contact pt for symptom check - pt needs abx, if sx, may need to return to ER for IV abx for possible septic arthritis  - would need gram - & anaerobic coverage" 02/05/15@ 15:47 Pt contacted and informed of need to return to ED for oral or IV abx.  Pt stated he will return, knee is "killing him".

## 2015-02-05 NOTE — Anesthesia Preprocedure Evaluation (Addendum)
Anesthesia Evaluation  Patient identified by MRN, date of birth, ID band Patient awake    Reviewed: Allergy & Precautions, NPO status , Patient's Chart, lab work & pertinent test results  History of Anesthesia Complications Negative for: history of anesthetic complications  Airway Mallampati: II  TM Distance: >3 FB Neck ROM: Full    Dental  (+) Teeth Intact, Dental Advisory Given   Pulmonary Current Smoker,    Pulmonary exam normal        Cardiovascular hypertension, Pt. on medications Normal cardiovascular exam     Neuro/Psych  Neuromuscular disease    GI/Hepatic (+)     substance abuse  alcohol use and cocaine use,   Endo/Other    Renal/GU Renal disease     Musculoskeletal  (+) Arthritis ,   Abdominal   Peds  Hematology  (+) anemia ,   Anesthesia Other Findings   Reproductive/Obstetrics                          Anesthesia Physical Anesthesia Plan  ASA: III  Anesthesia Plan: General   Post-op Pain Management:    Induction: Intravenous  Airway Management Planned: LMA  Additional Equipment:   Intra-op Plan:   Post-operative Plan: Extubation in OR  Informed Consent: I have reviewed the patients History and Physical, chart, labs and discussed the procedure including the risks, benefits and alternatives for the proposed anesthesia with the patient or authorized representative who has indicated his/her understanding and acceptance.   Dental advisory given  Plan Discussed with: Surgeon and CRNA  Anesthesia Plan Comments:        Anesthesia Quick Evaluation

## 2015-02-05 NOTE — ED Notes (Signed)
Pt reports having synovial fluid drained from right knee at this facility recently. Was called today and told he had "an infection in his right knee and to come back." Took Aleve today. No other c/c.

## 2015-02-05 NOTE — H&P (Signed)
Date: 02/05/2015               Patient Name:  Leon Nichols MRN: [id number]  DOB: 1966-02-18 Age / Sex: 49 y.o., male   PCP: Darrick HuntsmanWilliam R Kennedy, MD         Medical Service: Internal Medicine Teaching Service         Attending Physician: Dr. Ginnie SmartJeffrey C Hatcher, MD    First Contact: Dr. Gwynn BurlyAndrew Wallace, DO Pager: [phone]  Second Contact: Dr. Rich Numberarly Rivet, MD Pager: [phone]       After Hours (After 5p/  First Contact Pager: [phone]  weekends / holidays): Second Contact Pager: [phone]   Chief Complaint: Right Knee Pain  History of Present Illness:   Leon Nichols is a 49 year old gentleman with past medical history of HIV (04/25/14 CD4 770, VL 46, followed by Dr. Drue SecondSnider), perirectal/perianal abscesses s/p diverting colostomy (2013), and HTN who presents with right knee pain. This problem started on 10/26 and was associated with swelling. He took ibuprofen, which was ineffective. It continued to worsen until he could no longer walk on it, and he went to the ED on 10/31, when a arthrocentesis was performed, showing 126,000 WBCs. He was discharged from the ED, and called back due to synovial fluid growing gram negative coccobacilli  He denies any fever, joint discoloration, or recent trauma. He reports having a similar pain in his right joint in the past over the last few years, but it had never worsened to the point of seeking out medical attention, previously resolving on its own. His other complaint are sores around his anus which are quite painful to him, and he uses toilet paper to reduce chaffing of these sores. Anal and rectal abscesses of been a persistent problem for him since 2013, and he is followed by a surgeon Dr. Jamey RipaStreck who performed the sigmoid colostomy after drainage of complex perirectal fistulas and abscesses. He has used Epsom salts to minimal effect. Leon Nichols other problem is left sided hearing loss and tinnitus, which he says arose after shingles. He reports being seen by an ENT  about a year ago, but no intervention was made. Otherwise, he denies any headache, cough, shortness of breath, chest pain pain with urination, penile discharge, nausea, vomiting, change in ostomy output (empties twice a day), abdominal pain, or blackouts. He smokes occasionally on the weekends, drinks "as much as [he] can" on the weekends (1-2 fifths liquor plus beer), and he insufflates cocaine once per month - sometimes using the sharing dollar bills or straws. He denies IVDU, but IVDU is recorded in his EHR. He reports adherence to his HIV regimen.  He was evaluated by orthopedic surgeon, Margarita Ranaimothy Murphy, who recommended arthroscopic joint washout. During the operation, he was noted to have a medial/lateral meniscal tear and grade 2/3 chondral damage. The procedure was performed without complication. Today, he's noted to be afebrile with a leukocytosis to 10.9 with neutrophil predominance (8.1).   Meds: Current Facility-Administered Medications  Medication Dose Route Frequency Provider Last Rate Last Dose  . [MAR Hold] cefTRIAXone (ROCEPHIN) 2 g in dextrose 5 % 50 mL IVPB  2 g Intravenous Q24H Raeford RazorStephen Kohut, MD      . HYDROmorphone (DILAUDID) injection 0.25-0.5 mg  0.25-0.5 mg Intravenous Q5 min PRN Arta BruceKevin Ossey, MD   0.5 mg at 02/05/15 2325  . meperidine (DEMEROL) injection 6.25-12.5 mg  6.25-12.5 mg Intravenous Q5 min PRN Arta BruceKevin Ossey, MD      . Mitzi Hansen[MAR Hold]  morphine 4 MG/ML injection 4 mg  4 mg Intravenous Q1H PRN Raeford Razor, MD      . ondansetron [facility]) injection 4 mg  4 mg Intravenous Once PRN Arta Bruce, MD        Allergies: Allergies as of 02/05/2015  . (No Known Allergies)   Past Medical History  Diagnosis Date  . Hypertension   . Diverticulosis   . Hemorrhoids   . Perirectal abscess   . Colitis   . History of shingles   . HIV (human immunodeficiency virus infection) (HCC) 03/24/2012   Past Surgical History  Procedure Laterality Date  . Colonoscopy  08/31/2011    Procedure:  COLONOSCOPY;  Surgeon: Hilarie Fredrickson, MD;  Location: [facility] ENDOSCOPY;  Service: Endoscopy;  Laterality: N/A;  . Incision and drainage perirectal abscess  03/23/2012    Procedure: IRRIGATION AND DEBRIDEMENT PERIRECTAL ABSCESS;  Surgeon: Mariella Saa, MD;  Location: [facility] OR;  Service: General;  Laterality: N/A;  I & D soft tissue scrotal and perineal abscess  . Dressing change under anesthesia  03/27/2012    Procedure: DRESSING CHANGE UNDER ANESTHESIA;  Surgeon: Currie Paris, MD;  Location: [facility] OR;  Service: General;  Laterality: N/A;  . Laparoscopic diverted colostomy  03/27/2012    Procedure: LAPAROSCOPIC DIVERTED COLOSTOMY;  Surgeon: Currie Paris, MD;  [facility] OR;  Service: General;  Laterality: N/A;   Family History  Problem Relation Age of Onset  . Hypertension Mother   . Diabetes Mother    Social History   Social History  . Marital Status: Single    Spouse Name: N/A  . Number of Children: N/A  . Years of Education: N/A   Occupational History  . Not on file.   Social History Main Topics  . Smoking status: Light Tobacco Smoker -- 0.10 packs/day for 15 years    Types: Cigarettes  . Smokeless tobacco: Never Used     Comment: 1 cig a week  . Alcohol Use: Yes     Comment: rarely.  . Drug Use: No  . Sexual Activity: Not on file   Other Topics Concern  . Not on file   Social History Narrative    Review of Systems: Negative except per HPI  Physical Exam: Blood pressure 156/99, pulse 108, temperature 97.4 F (36.3 C), temperature source Oral, resp. rate 24, height  (1.854 m), weight 250 lb (113.399 kg), SpO2 93 %. General: Lying in bed, appearing uncomfortable HEENT: Moist mucous membranes, no tonsillar erythema or exudates, no cervical adenopathy, otoscope was attempted but instrument lacked sufficient lighting, small lesion noted on pinna Cardiovascular: Mildly tachycardic. Regular rhythm, no m/r/g Pulmonary: Clear to ausculation  bilaterally Abdomen: NTND. Ostomy with minimal output Skin: Perianal abscesses with open sores noted, tender to palpation. No other evidence of cellulitis or abscesses in other areas.  MSK: Right knee wrapped. No other painful joints or swelling noted. Neurological: Sound perception lateralizes to the right. EOMI. PERRL. Tongue midline, face symmetric. 5/5 strength in UEs and LLE. Sensation in tact in extremities. Able to wiggle toes. Extremities: No clubbing, cyanosis, or edema. 2+ DP pulses  Lab results: Basic Metabolic Panel:  Recent Labs  16/10/96 1952  NA 136  K 3.3*  CL 99*  CO2 25  GLUCOSE 157*  BUN 25*  CREATININE 1.04  CALCIUM 8.8*   CBC:  Recent Labs  02/05/15 1834 02/05/15 1952  WBC 10.9* 10.7*  NEUTROABS 8.1* 8.0*  HGB 14.4 13.7  HCT 41.9 39.6  MCV 95.0 95.4  PLT 471* 506*   CBG:  Recent Labs  02/03/15 2015  GLUCAP 119*   Urine Drug Screen: Drugs of Abuse     Component Value Date/Time   LABOPIA NONE DETECTED 02/02/2014 1345   COCAINSCRNUR POSITIVE* 02/02/2014 1345   LABBENZ NONE DETECTED 02/02/2014 1345   AMPHETMU NONE DETECTED 02/02/2014 1345   THCU NONE DETECTED 02/02/2014 1345   LABBARB NONE DETECTED 02/02/2014 1345    Imaging results:  CLINICAL DATA: Pain and swelling for 6 days. No history of trauma  EXAM: RIGHT KNEE - COMPLETE 4+ VIEW  COMPARISON: None.  FINDINGS: Frontal, lateral, and bilateral oblique views were obtained. There is no demonstrable fracture or dislocation. There is a sizable joint effusion. There is moderate narrowing medially. There is spurring in the medial and lateral compartments as well as in the intercondylar region. No erosive change.  IMPRESSION: Osteoarthritic change, most notably medially. Sizable joint effusion. No erosive change. No fracture or dislocation.  Other results: Normal sinus rhythm Nonspecific ST and T wave abnormality Abnormal ECG No significant change was found  Assessment  & Plan by Problem:  Septic Arthritis in setting of HIV: Status-pose washout by orthopedic surgery. Per ED note, joint aspirate on 10/31 grew gram negative coccobacilli upon re-incubation. Exact organism is currently unknown. Per ED provider note on 11/2, ID currently recommends IV ceftriaxone. No need for anti-pseudomonal coverage at this time. He reports good adherence with his HIV medication, although it is concerning he would develop a septic joint spontaneously without any trauma. His last CD4 and VL were reassuring in January 2016. - Ceftriaxone 2g daily IV for 14 days - Hydromorphone 1 mg q2h prn severe pain, Oxycodone 5-10 mg q3h prn breakthrough pain. Down-titrate as tolerated. - Tylenol prn fever - CD4 and VL pending - CBC in AM  Perianal Abscesses s/p sigmoid colostomy: This has be a persistent problem for him and his causing him significant discomfort. Abscess closer to perineal region may require I&D. It is concerning that this has not yet resolved if in fact he is functionally immunocompetent, as he was in January 2016. - May benefit from better gram + coverage (e.g. cefazolin)  HIV: 04/25/14 CD4 770, VL 46, followed by Dr. Drue Second. While he reports adherence to his home medications, it is concerning that he would spontaneously develop a septic joint and his perianal abscesses have persisted. - Repeat CD4, VL - Continue home abacavir-lamivudine 600-300 mg 1 tablet daily, Continue home dolutegravir 50 mg daily  Hearing loss: Apparently due to zoster outbreak. Lateralizes to right side on exam. Will require a more thorough otoscopic evaluation.  HTN: 156/99 on admission. - Continue home amlodipine 10 mg  Diet: Regular DVT Prophylaxis: Heparin [place] Code Status: Full Admit to: Floor  Dispo: Disposition is deferred at this time, awaiting improvement of current medical problems. Anticipated discharge in approximately 2-3 day(s).   The patient does have a current PCP Darrick Huntsman,  MD) and does need an [facility] hospital follow-up appointment after discharge.  The patient does have transportation limitations that hinder transportation to clinic appointments.  Signed: Ruben Im, MD 02/05/2015, 11:30 PM

## 2015-02-05 NOTE — ED Notes (Signed)
[facility] called to transport pt and report [facility] to [facility]

## 2015-02-05 NOTE — Interval H&P Note (Signed)
I participated in the care of this patient and agree with the above history, physical and evaluation. I performed a review of the history and a physical exam as detailed   Mujtaba Bollig Daniel Shota Kohrs MD  

## 2015-02-05 NOTE — Progress Notes (Signed)
ANTIBIOTIC CONSULT NOTE - INITIAL  Pharmacy Consult for Ceftriaxone Indication: Septic arthritis  No Known Allergies  Patient Measurements: Height: 6\' 1"  (185.4 cm) Weight: 250 lb (113.399 kg) IBW/kg (Calculated) : 79.9 Adjusted Body Weight:   Vital Signs: Temp: 98.2 F (36.8 C) (11/02 1747) Temp Source: Oral (11/02 1747) BP: 121/80 mmHg (11/02 1747) Pulse Rate: 109 (11/02 1747) Intake/Output from previous day:   Intake/Output from this shift:    Labs: No results for input(s): WBC, HGB, PLT, LABCREA, CREATININE in the last 72 hours. CrCl cannot be calculated (Patient has no serum creatinine result on file.). No results for input(s): VANCOTROUGH, VANCOPEAK, VANCORANDOM, GENTTROUGH, GENTPEAK, GENTRANDOM, TOBRATROUGH, TOBRAPEAK, TOBRARND, AMIKACINPEAK, AMIKACINTROU, AMIKACIN in the last 72 hours.   Microbiology: Recent Results (from the past 720 hour(s))  Body fluid culture     Status: None (Preliminary result)   Collection Time: 02/03/15  8:23 PM  Result Value Ref Range Status   Specimen Description SYNOVIAL  Final   Special Requests Normal  Final   Gram Stain   Final    CYTOSPIN WBC PRESENT, PREDOMINANTLY PMN NO ORGANISMS SEEN Gram Stain Report Called to,Read Back By and Verified With: Kermit BaloL ADKINS RN 2136 02/03/15 A NAVARRO    Culture   Final    CULTURE REINCUBATED FOR BETTER GROWTH CRITICAL RESULT CALLED TO, READ BACK BY AND VERIFIED WITH: P CLARK 02/05/15 @ 1446 M VESTAL [facility]    Report Status PENDING  Incomplete    Medical History: Past Medical History  Diagnosis Date  . Hypertension   . Diverticulosis   . Hemorrhoids   . Perirectal abscess   . Colitis   . History of shingles   . HIV (human immunodeficiency virus infection) (HCC) 03/24/2012    Assessment: [age] yoM with HIV and 1 week history of atraumatic right knee pain presented to ED on 10/31 and had arthrocentesis performed.  Pt called to return to ED for positive synovial  cultures (+turbid, WBC) and per reports, gram negative coccobacilli.  ID recommends starting ceftriaxone for septic arthritis.  No antibiotic allergies noted.    Goal of Therapy:  Eradication of infection  Plan:  Ceftriaxone 2g IV q24h.  F/u cultures and clinical course.    Haynes Hoehnolleen Babette Stum, PharmD, BCPS 02/05/2015, 6:57 PM  Pager: [phone]

## 2015-02-05 NOTE — ED Notes (Signed)
Dr. Verlin GrillsMurphly at the bedside in OR. Bedside report for Leon Nichols, OR nurse.

## 2015-02-06 ENCOUNTER — Encounter (HOSPITAL_COMMUNITY): Payer: Self-pay | Admitting: General Practice

## 2015-02-06 DIAGNOSIS — K610 Anal abscess: Secondary | ICD-10-CM | POA: Diagnosis present

## 2015-02-06 DIAGNOSIS — Z21 Asymptomatic human immunodeficiency virus [HIV] infection status: Secondary | ICD-10-CM

## 2015-02-06 DIAGNOSIS — I10 Essential (primary) hypertension: Secondary | ICD-10-CM

## 2015-02-06 DIAGNOSIS — M009 Pyogenic arthritis, unspecified: Principal | ICD-10-CM

## 2015-02-06 HISTORY — DX: Anal abscess: K61.0

## 2015-02-06 LAB — CBC
HEMATOCRIT: 36 % — AB (ref 39.0–52.0)
HEMOGLOBIN: 11.9 g/dL — AB (ref 13.0–17.0)
MCH: 31.8 pg (ref 26.0–34.0)
MCHC: 33.1 g/dL (ref 30.0–36.0)
MCV: 96.3 fL (ref 78.0–100.0)
Platelets: 407 10*3/uL — ABNORMAL HIGH (ref 150–400)
RBC: 3.74 MIL/uL — AB (ref 4.22–5.81)
RDW: 15.8 % — ABNORMAL HIGH (ref 11.5–15.5)
WBC: 10.3 10*3/uL (ref 4.0–10.5)

## 2015-02-06 MED ORDER — LISINOPRIL 40 MG PO TABS
40.0000 mg | ORAL_TABLET | Freq: Every day | ORAL | Status: DC
  Administered 2015-02-06 – 2015-02-08 (×3): 40 mg via ORAL
  Filled 2015-02-06 (×3): qty 1

## 2015-02-06 MED ORDER — LAMIVUDINE 150 MG PO TABS
300.0000 mg | ORAL_TABLET | Freq: Every day | ORAL | Status: DC
  Administered 2015-02-06 – 2015-02-08 (×3): 300 mg via ORAL
  Filled 2015-02-06 (×3): qty 2

## 2015-02-06 MED ORDER — ABACAVIR SULFATE-LAMIVUDINE 600-300 MG PO TABS
1.0000 | ORAL_TABLET | Freq: Every day | ORAL | Status: DC
  Filled 2015-02-06: qty 1

## 2015-02-06 MED ORDER — INFLUENZA VAC SPLIT QUAD 0.5 ML IM SUSY
0.5000 mL | PREFILLED_SYRINGE | INTRAMUSCULAR | Status: AC
  Administered 2015-02-07: 0.5 mL via INTRAMUSCULAR
  Filled 2015-02-06: qty 0.5

## 2015-02-06 MED ORDER — DOLUTEGRAVIR SODIUM 50 MG PO TABS
50.0000 mg | ORAL_TABLET | Freq: Every day | ORAL | Status: DC
  Administered 2015-02-06 – 2015-02-08 (×3): 50 mg via ORAL
  Filled 2015-02-06 (×3): qty 1

## 2015-02-06 MED ORDER — ABACAVIR SULFATE 300 MG PO TABS
600.0000 mg | ORAL_TABLET | Freq: Every day | ORAL | Status: DC
  Administered 2015-02-06 – 2015-02-08 (×3): 600 mg via ORAL
  Filled 2015-02-06 (×4): qty 2

## 2015-02-06 MED ORDER — AMLODIPINE BESYLATE 10 MG PO TABS
10.0000 mg | ORAL_TABLET | Freq: Every day | ORAL | Status: DC
  Administered 2015-02-06 – 2015-02-08 (×3): 10 mg via ORAL
  Filled 2015-02-06 (×3): qty 1

## 2015-02-06 NOTE — NC FL2 (Signed)
[place] MEDICAID FL2 LEVEL OF CARE SCREENING TOOL     IDENTIFICATION  Patient Name: Leon Nichols Birthdate: 1965/09/03 Sex: male Admission Date (Current Location): 02/05/2015  [place] and [place] Number: Haynes Bast [id number] P Facility and Address:  [facility] [place], [address], [place], [place] [address]      Provider Number: [id number]  Attending Physician Name and Address:  Ginnie Smart, MD  Relative Name and Phone Number:       Current Level of Care: Hospital Recommended Level of Care: Skilled Nursing Facility Prior Approval Number:    Date Approved/Denied:   PASRR Number: [id number]  Discharge Plan: SNF    Current Diagnoses: Patient Active Problem List   Diagnosis Date Noted  . Perianal abscess 02/06/2015  . Septic joint (HCC) 02/05/2015  . Septic arthritis (HCC) 02/05/2015  . 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 Colitis 02/02/2014  . Acute renal failure (HCC) 02/02/2014  . Neuralgia, post-herpetic 01/02/2014  . Gingivitis 01/02/2014  . HIV (human immunodeficiency virus infection) (HCC) 03/24/2012  . Anemia 03/23/2012  . Penile ulcer 03/23/2012  . Diverticulosis of colon (without mention of hemorrhage) 08/31/2011  . HYPERTENSION, BENIGN ESSENTIAL 03/29/2007    Orientation ACTIVITIES/SOCIAL BLADDER RESPIRATION    Self, Time, Situation, Place    Continent Normal  BEHAVIORAL SYMPTOMS/MOOD NEUROLOGICAL BOWEL NUTRITION STATUS      Continent Diet (Regular)  PHYSICIAN VISITS COMMUNICATION OF NEEDS Height & Weight Skin    Verbally 6\' 1"  (185.4 cm) 250 lbs. Surgical wounds          AMBULATORY STATUS RESPIRATION    Supervision limited Normal      Personal Care Assistance Level of Assistance  Dressing, Bathing Bathing Assistance: Maximum assistance   Dressing Assistance: Maximum assistance      Functional Limitations Info                SPECIAL CARE FACTORS FREQUENCY                       Additional Factors Info  Code Status Code Status Info: Full             Current Medications (02/06/2015): Current Facility-Administered Medications  Medication Dose Route Frequency Provider Last Rate Last Dose  . 0.9 %  sodium chloride infusion   Intravenous Continuous Raeford Razor, MD 100 mL/hr at 02/06/15 1258    . abacavir (ZIAGEN) tablet 600 mg  600 mg Oral Daily Ginnie Smart, MD   600 mg at 02/06/15 1041   And  . lamiVUDine (EPIVIR) tablet 300 mg  300 mg Oral Daily Ginnie Smart, MD   300 mg at 02/06/15 1041  . acetaminophen (TYLENOL) tablet 650 mg  650 mg Oral Q6H PRN Janalee Dane, PA-C       Or  . acetaminophen (TYLENOL) suppository 650 mg  650 mg Rectal Q6H PRN Janalee Dane, PA-C      . amLODipine (NORVASC) tablet 10 mg  10 mg Oral Daily Gust Rung, DO   10 mg at 02/06/15 1040  . cefTRIAXone (ROCEPHIN) 2 g in dextrose 5 % 50 mL IVPB  2 g Intravenous Q24H Raeford Razor, MD      . docusate sodium (COLACE) capsule 100 mg  100 mg Oral BID Janalee Dane, PA-C   100 mg at 02/06/15 1040  . dolutegravir (TIVICAY) tablet 50 mg  50 mg Oral Daily Gust Rung, DO   50 mg at  02/06/15 1041  . heparin injection 5,000 Units  5,000 Units Subcutaneous 3 times per day Gust Rung, DO   5,000 Units at 02/06/15 1301  . HYDROmorphone (DILAUDID) injection 1 mg  1 mg Intravenous Q2H PRN Janalee Dane, PA-C   1 mg at 02/06/15 1528  . [START ON 02/07/2015] Influenza vac split quadrivalent PF (FLUARIX) injection 0.5 mL  0.5 mL Intramuscular Tomorrow-1000 Ginnie Smart, MD      . lisinopril (PRINIVIL,ZESTRIL) tablet 40 mg  40 mg Oral Daily Gust Rung, DO   40 mg at 02/06/15 1040  . metoCLOPramide (REGLAN) tablet 5-10 mg  5-10 mg Oral Q8H PRN Janalee Dane, PA-C       Or  . metoCLOPramide (REGLAN) injection 5-10 mg  5-10 mg Intravenous Q8H PRN Brittney Kelly, PA-C      . ondansetron (ZOFRAN) tablet 4 mg  4 mg Oral Q6H PRN Janalee Dane, PA-C       Or  .  ondansetron (ZOFRAN) injection 4 mg  4 mg Intravenous Q6H PRN Brittney Kelly, PA-C      . oxyCODONE (Oxy IR/ROXICODONE) immediate release tablet 5-10 mg  5-10 mg Oral Q3H PRN Janalee Dane, PA-C   10 mg at 02/06/15 1742  . polyethylene glycol (MIRALAX / GLYCOLAX) packet 17 g  17 g Oral Daily PRN Janalee Dane, PA-C       Do not use this list as official medication orders. Please verify with discharge summary.  Discharge Medications:   Medication List    TAKE these medications        oxyCODONE-acetaminophen 5-325 MG tablet  Commonly known as:  PERCOCET  Take 1-2 tablets by mouth every 4 (four) hours as needed for severe pain.      ASK your doctor about these medications        amLODipine 10 MG tablet  Commonly known as:  NORVASC  Take 1 tablet (10 mg total) by mouth daily.     EPZICOM 600-300 MG tablet  Generic drug:  abacavir-lamiVUDine  TAKE ONE TABLET BY MOUTH EVERY DAY     ibuprofen 200 MG tablet  Commonly known as:  ADVIL,MOTRIN  Take 800 mg by mouth every 4 (four) hours as needed for headache or moderate pain.     ibuprofen 800 MG tablet  Commonly known as:  ADVIL,MOTRIN  Take 1 tablet (800 mg total) by mouth 3 (three) times daily.     lisinopril 40 MG tablet  Commonly known as:  PRINIVIL,ZESTRIL  Take 1 tablet (40 mg total) by mouth daily.     naproxen sodium 220 MG tablet  Commonly known as:  ANAPROX  Take 660 mg by mouth daily as needed (pain).     Ostomy Supplies POUCH Misc  1 Container by Does not apply route daily.     TIVICAY 50 MG tablet  Generic drug:  dolutegravir  TAKE 1 TABLET BY MOUTH DAILY        Relevant Imaging Results:  Relevant Lab Results:  Recent Labs    Additional Information No Known Allergies  Adaria Hole, Ervin Knack, LCSWA

## 2015-02-06 NOTE — Evaluation (Signed)
Physical Therapy Evaluation Patient Details Name: Leon Nichols MRN: [id number] DOB: 1965-11-14 Today's Date: 02/06/2015   History of Present Illness  49 year old gentleman with past medical history of HIV, perirectal/perianal abscesses s/p diverting colostomy (2013), and HTN who presents with right knee pain. Underwent ARTHROSCOPIC WASH OUT RIGHT KNEE.   Clinical Impression  Patient is s/p above surgery resulting in functional limitations due to the deficits listed below (see PT Problem List). Patient will benefit from skilled PT to increase their independence and safety with mobility to allow discharge to the venue listed below. Initial mobilization limited by the patient's reports of Rt knee pain. Heavy encouragement needed for ambulation of 3 feet with rw and min guard assistance. Patient states that he is unable to walk any further at this time due to the pain. Will continue to follow to progress ambulation and stairs for anticipated D/C to home.       Follow Up Recommendations Home health PT;Supervision for mobility/OOB    Equipment Recommendations  None recommended by PT;Other (comment) (patient states that he has a Krull to use at home.)    Recommendations for Other Services       Precautions / Restrictions Precautions Precautions: Fall Restrictions Weight Bearing Restrictions: Yes RLE Weight Bearing: Weight bearing as tolerated      Mobility  Bed Mobility     General bed mobility comments: up in chair upon arrival  Transfers Overall transfer level: Needs assistance Equipment used: Rolling [place] (2 wheeled) Transfers: Sit to/from Stand Sit to Stand: Min guard Stand pivot transfers: Min assist       General transfer comment: slow mobility, heavy reliance on UEs, no active assistance with Rt LE noted.   Ambulation/Gait Ambulation/Gait assistance: Min guard Ambulation Distance (Feet): 3 Feet Assistive device: Rolling Disney (2 wheeled) Gait Pattern/deviations:  Step-to pattern;Decreased weight shift to right Gait velocity: very slow   General Gait Details: Patient remaining NWB through Rt LE with ambulation despite cues for WBAT. Patient expressing that he is having too much pain to ambulate any further.   Stairs            Wheelchair Mobility    Modified Rankin (Stroke Patients Only)       Balance Overall balance assessment: Needs assistance Sitting-balance support: No upper extremity supported Sitting balance-Leahy Scale: Good     Standing balance support: Bilateral upper extremity supported Standing balance-Leahy Scale: Poor Standing balance comment: using rw                             Pertinent Vitals/Pain Pain Assessment: 0-10 Pain Score: 6  Pain Location: Rt knee Pain Descriptors / Indicators: Aching Pain Intervention(s): Limited activity within patient's tolerance;Monitored during session    Home Living Family/patient expects to be discharged to:: Private residence Living Arrangements: Parent Available Help at Discharge: Family;Available PRN/intermittently (brother may be able to help) Type of Home: House Home Access: Stairs to enter Entrance Stairs-Rails: None Entrance Stairs-Number of Steps: 2 Home Layout: One level Home Equipment: Environmental consultant - 2 wheels    Prior Function Level of Independence: Independent               Hand Dominance        Extremity/Trunk Assessment   Upper Extremity Assessment: Defer to OT evaluation           Lower Extremity Assessment: RLE deficits/detail RLE Deficits / Details: patient not actively engaging Rt LE during mobility,  remaining NWB with ambulation.     Cervical / Trunk Assessment: Normal  Communication   Communication: No difficulties  Cognition Arousal/Alertness: Awake/alert Behavior During Therapy: WFL for tasks assessed/performed Overall Cognitive Status: Within Functional Limits for tasks assessed                      General  Comments General comments (skin integrity, edema, etc.): Patient expressing pain with all mobility during session, reports that he is not due for any pain medication at this time. Encouraging increased amulation but patient reporting that he is unable to go any further.     Exercises       Assessment/Plan    PT Assessment Patient needs continued PT services  PT Diagnosis Difficulty walking;Acute pain   PT Problem List Decreased strength;Decreased range of motion;Decreased activity tolerance;Decreased balance;Decreased mobility;Pain  PT Treatment Interventions DME instruction;Gait training;Stair training;Functional mobility training;Therapeutic activities;Therapeutic exercise;Patient/family education   PT Goals (Current goals can be found in the Care Plan section) Acute Rehab PT Goals Patient Stated Goal: have less pain PT Goal Formulation: With patient Time For Goal Achievement: 02/20/15 Potential to Achieve Goals: Good    Frequency Min 5X/week   Barriers to discharge        Co-evaluation               End of Session Equipment Utilized During Treatment: Gait belt Activity Tolerance: Patient limited by pain Patient left: in chair;with call bell/phone within reach Nurse Communication: Mobility status;Weight bearing status         Time: 1150-1205 PT Time Calculation (min) (ACUTE ONLY): 15 min   Charges:   PT Evaluation $Initial PT Evaluation Tier I: 1 Procedure     PT G Codes:        Christiane Ha, PT, CSCS Pager [phone] Office [phone]  02/06/2015, 12:19 PM

## 2015-02-06 NOTE — Progress Notes (Signed)
Occupational Therapy Evaluation Patient Details Name: Leon Nichols MRN: [id number] DOB: 10-09-1965 Today's Date: 02/06/2015    History of Present Illness 49 year old gentleman with past medical history of HIV, perirectal/perianal abscesses s/p diverting colostomy (2013), and HTN who presents with right knee pain. Underwent ARTHROSCOPIC WASH OUT RIGHT KNEE.    Clinical Impression   PTA, pt independent with ADL and mobility. Using [place] at times PTA due to pain. Pain limiting performance this session. Began education regarding ADL and functional mobility for ADL. Pt will most likely be appropriate for D/C tomorrow if medically stable and if able to negotiate 2 STE. Will follow.     Follow Up Recommendations  No OT follow up;Supervision - Intermittent    Equipment Recommendations  Tub/shower bench;Other (comment) (pt to decide if he wants tub bench)    Recommendations for Other Services       Precautions / Restrictions Precautions Precautions: Fall Restrictions Weight Bearing Restrictions: Yes RLE Weight Bearing: Weight bearing as tolerated      Mobility Bed Mobility Overal bed mobility: Needs Assistance Bed Mobility: Supine to Sit     Supine to sit: Min assist     General bed mobility comments: Assist needed to lift RLE off bed and slide to EOB due to pain  Transfers Overall transfer level: Needs assistance Equipment used: Rolling Knoebel (2 wheeled) Transfers: Sit to/from [employer] Transfers   Stand pivot transfers: Min assist       General transfer comment: Very slow. unable to tolerate any weight through knee. Only able to tolerate @ 5 steps to chair.    Balance Overall balance assessment: Needs assistance   Sitting balance-Leahy Scale: Good     Standing balance support: During functional activity Standing balance-Leahy Scale: Fair                              ADL Overall ADL's : Needs assistance/impaired         Upper Body  Bathing: Set up   Lower Body Bathing: Minimal assistance;Sit to/from stand   Upper Body Dressing : Set up   Lower Body Dressing: Moderate assistance;Sit to/from stand   Toilet Transfer: Minimal assistance (simulated)   Toileting- Clothing Manipulation and Hygiene: Set up Toileting - Clothing Manipulation Details (indicate cue type and reason): Pt uses urinal and independently cares for colostomy     Functional mobility during ADLs: Minimal assistance;Rolling [place];Cueing for safety;Cueing for sequencing (INCREASED time) General ADL Comments: Began education on compensatory techniques for ADL. Discussed availability of AE. Pt may benefit from tub bench - his father is 80 yo. Pt is deciding whether he wants a tub bench. discussed this is an out of poscket expense and that he would need to sponge bath until he can safely step over tub.      Vision     Perception     Praxis      Pertinent Vitals/Pain Pain Assessment: 0-10 Pain Score: 6  Pain Location: R knee Pain Descriptors / Indicators: Aching Pain Intervention(s): Limited activity within patient's tolerance;Monitored during session;Repositioned;Ice applied     Hand Dominance     Extremity/Trunk Assessment Upper Extremity Assessment Upper Extremity Assessment: Overall WFL for tasks assessed   Lower Extremity Assessment Lower Extremity Assessment: RLE deficits/detail RLE Deficits / Details: unable to achieve full extension. Keepinng knee @ 30 degrees flexion. limited if any weight tolerated through RLE   Cervical / Trunk Assessment Cervical / Trunk  Assessment: Normal   Communication     Cognition Arousal/Alertness: Awake/alert Behavior During Therapy: WFL for tasks assessed/performed Overall Cognitive Status: Within Functional Limits for tasks assessed                     General Comments       Exercises Exercises: Other exercises Other Exercises Other Exercises: encouraged pt to work on knee extension.  Also encouraged pt to keep ice on knee. Discussed role of muscle contraction and reducing edema.   Shoulder Instructions      Home Living Family/patient expects to be discharged to:: Private residence Living Arrangements: Parent Available Help at Discharge: Family;Available PRN/intermittently Type of Home: House Home Access: Stairs to enter [employer] of Steps: 2 Entrance Stairs-Rails: None Home Layout: One level     Bathroom Shower/Tub: Tub/shower unit Shower/tub characteristics: Engineer, building services: Standard Bathroom Accessibility: Yes   Home Equipment: Shower seat;Hand held shower head;Bedside commode (2 BSC)   Additional Comments: Can use Dad's Leber      Prior Functioning/Environment Level of Independence: Independent             OT Diagnosis: Generalized weakness;Acute pain   OT Problem List: Decreased strength;Decreased range of motion;Decreased activity tolerance;Decreased safety awareness;Decreased knowledge of use of DME or AE;Pain;Increased edema   OT Treatment/Interventions: Self-care/ADL training;DME and/or AE instruction;Patient/family education;Therapeutic activities    OT Goals(Current goals can be found in the care plan section) Acute Rehab OT Goals Patient Stated Goal: to not be in pain OT Goal Formulation: With patient Time For Goal Achievement: 02/13/15 Potential to Achieve Goals: Good ADL Goals Pt Will Perform Lower Body Bathing: with set-up;with supervision;with adaptive equipment;sit to/from stand Pt Will Perform Lower Body Dressing: with set-up;with supervision;with adaptive equipment;sit to/from stand Pt Will Perform Tub/Shower Transfer: with min guard assist;ambulating;tub bench;rolling [place]  OT Frequency: Min 2X/week   Barriers to D/C: Other (comment) (limited caregiver support during the day. Dad is 58 yo)          Co-evaluation              End of Session Equipment Utilized During Treatment: Gait  belt;Rolling Weng Nurse Communication: Mobility status;Weight bearing status  Activity Tolerance: Patient limited by pain Patient left: in chair;with call bell/phone within reach   Time: 1012-1040 OT Time Calculation (min): 28 min Charges:  OT General Charges $OT Visit: 1 Procedure OT Evaluation $Initial OT Evaluation Tier I: 1 Procedure OT Treatments $Self Care/Home Management : 8-22 mins G-Codes:    Dessire Grimes,HILLARY Feb 18, 2015, 10:58 AM   Luisa Dago, OTR/L  [phone] February 18, 2015

## 2015-02-06 NOTE — Consult Note (Addendum)
WOC consult requested for ostomy assistance.  Pt had colostomy surgery in 2013, according to the EMR.  He states he is independent with pouch application and emptying and denies any problems related to the stoma prior to admission or need for assistance at this time. Stoma red and viable when visualized through pouch, small amt semi-formed brown stool. He applied the current pouch yesterday.  Extra pouch left at bedside for patient use.  Please re-consult if further assistance is needed.  Thank-you,  Cammie Mcgeeawn Marcia Hartwell MSN, RN, CWOCN, [place]CN-AP, CNS [phone]

## 2015-02-06 NOTE — Progress Notes (Signed)
     Subjective:  POD#1 I/D of R septic knee.  Patient reports pain as mild to moderate.  Resting comfortably in bed this morning.  ID consulted to help manage abx.  Rocephin for now.  If oral abx appropriate, would prefer given hx of IV drug use.    Objective:   VITALS:   Filed Vitals:   02/05/15 2301 02/05/15 2325 02/05/15 2337 02/06/15 0452  BP: 156/99  138/76 108/65  Pulse: 108  101 92  Temp:  97.4 F (36.3 C) 98.1 F (36.7 C) 98.4 F (36.9 C)  TempSrc:   Oral Oral  Resp: 24  18 16   Height:      Weight:      SpO2: 93%  94% 96%    Neurologically intact ABD soft Neurovascular intact Sensation intact distally Intact pulses distally Dorsiflexion/Plantar flexion intact Incision: dressing C/D/I   Lab Results  Component Value Date   WBC 10.7* 02/05/2015   HGB 13.7 02/05/2015   HCT 39.6 02/05/2015   MCV 95.4 02/05/2015   PLT 506* 02/05/2015   BMET    Component Value Date/Time   NA 136 02/05/2015 1952   K 3.3* 02/05/2015 1952   CL 99* 02/05/2015 1952   CO2 25 02/05/2015 1952   GLUCOSE 157* 02/05/2015 1952   BUN 25* 02/05/2015 1952   CREATININE 1.04 02/05/2015 1952   CREATININE 0.84 04/25/2014 1018   CALCIUM 8.8* 02/05/2015 1952   GFRNONAA >60 02/05/2015 1952   GFRNONAA >89 04/25/2014 1018   GFRAA >60 02/05/2015 1952   GFRAA >89 04/25/2014 1018     Assessment/Plan: 1 Day Post-Op   Principal Problem:   Septic arthritis (HCC) Active Problems:   HYPERTENSION, BENIGN ESSENTIAL   HIV (human immunodeficiency virus infection) (HCC)   Altered bowel elimination due to intestinal ostomy (HCC)   Septic joint (HCC)   Perianal abscess   Up with therapy WBAT in the RLE ID managing abx.  Will determine if picc line is needed for discharge. OK from ortho standpoint to discharge once abx plan in place and patient is mobilizing with PT.    Lynann BolognaKelly,Menachem Urbanek Marie 02/06/2015, 6:39 AM Cell [phone]

## 2015-02-06 NOTE — Anesthesia Postprocedure Evaluation (Signed)
Anesthesia Post Note  Patient: Leon Nichols  Procedure(s) Performed: Procedure(s) (LRB): ARTHROSCOPIC WASH OUT RIGHT KNEE (Right)  Anesthesia type: general  Patient location: PACU  Post pain: Pain level controlled  Post assessment: Patient's Cardiovascular Status Stable  Last Vitals:  Filed Vitals:   02/05/15 2337  BP: 138/76  Pulse: 101  Temp: 36.7 C  Resp: 18    Post vital signs: Reviewed and stable  Level of consciousness: sedated  Complications: No apparent anesthesia complications

## 2015-02-06 NOTE — Progress Notes (Signed)
Utilization review completed. Lilu Mcglown, RN, BSN. 

## 2015-02-06 NOTE — Progress Notes (Addendum)
Subjective: Patient is POD #1 following arthroscopic washout of a septic right knee by Dr. Eulah Pont.  Today, he is doing well, reports getting up out of bed with PT and pain is adequately controlled.  No other complaints.  Objective: Vital signs in last 24 hours: Filed Vitals:   02/05/15 2325 02/05/15 2337 02/06/15 0452 02/06/15 1300  BP:  138/76 108/65 110/71  Pulse:  101 92 89  Temp: 97.4 F (36.3 C) 98.1 F (36.7 C) 98.4 F (36.9 C) 98.2 F (36.8 C)  TempSrc:  Oral Oral Oral  Resp:  Height:      Weight:      SpO2:  94% 96% 98%   Weight change:   Intake/Output Summary (Last 24 hours) at 02/06/15 1633 Last data filed at 02/06/15 0900  Gross per 24 hour  Intake 2301.67 ml  Output      0 ml  Net 2301.67 ml   General: resting in bed HEENT: EOMI, no scleral icterus Cardiac: RRR, no rubs, murmurs or gallops Pulm: clear to auscultation bilaterally, moving normal volumes of air Ext: warm and well perfused, no pedal edema Neuro: alert and oriented X3, cranial nerves II-XII grossly intact  Lab Results: Basic Metabolic Panel:  Recent Labs Lab 02/05/15 1952  NA 136  K 3.3*  CL 99*  CO2 25  GLUCOSE 157*  BUN 25*  CREATININE 1.04  CALCIUM 8.8*   Liver Function Tests: No results for input(s): AST, ALT, ALKPHOS, BILITOT, PROT, ALBUMIN in the last 168 hours. No results for input(s): LIPASE, AMYLASE in the last 168 hours. No results for input(s): AMMONIA in the last 168 hours. CBC:  Recent Labs Lab 02/05/15 1834 02/05/15 1952 02/06/15 0540  WBC 10.9* 10.7* 10.3  NEUTROABS 8.1* 8.0*  --   HGB 14.4 13.7 11.9*  HCT 41.9 39.6 36.0*  MCV 95.0 95.4 96.3  PLT 471* 506* 407*   Cardiac Enzymes: No results for input(s): CKTOTAL, CKMB, CKMBINDEX, TROPONINI in the last 168 hours. BNP: No results for input(s): PROBNP in the last 168 hours. D-Dimer: No results for input(s): DDIMER in the last 168 hours. CBG:  Recent Labs Lab 02/03/15 2015  GLUCAP 119*    Hemoglobin A1C: No results for input(s): HGBA1C in the last 168 hours. Fasting Lipid Panel: No results for input(s): CHOL, HDL, LDLCALC, TRIG, CHOLHDL, LDLDIRECT in the last 168 hours. Thyroid Function Tests: No results for input(s): TSH, T4TOTAL, FREET4, T3FREE, THYROIDAB in the last 168 hours. Coagulation: No results for input(s): LABPROT, INR in the last 168 hours. Anemia Panel: No results for input(s): VITAMINB12, FOLATE, FERRITIN, TIBC, IRON, RETICCTPCT in the last 168 hours. Urine Drug Screen: Drugs of Abuse     Component Value Date/Time   LABOPIA NONE DETECTED 02/02/2014 1345   COCAINSCRNUR POSITIVE* 02/02/2014 1345   LABBENZ NONE DETECTED 02/02/2014 1345   AMPHETMU NONE DETECTED 02/02/2014 1345   THCU NONE DETECTED 02/02/2014 1345   LABBARB NONE DETECTED 02/02/2014 1345    Alcohol Level: No results for input(s): ETH in the last 168 hours. Urinalysis: No results for input(s): COLORURINE, LABSPEC, PHURINE, GLUCOSEU, HGBUR, BILIRUBINUR, KETONESUR, PROTEINUR, UROBILINOGEN, NITRITE, LEUKOCYTESUR in the last 168 hours.  Invalid input(s): APPERANCEUR Misc. Labs: none  Micro Results: Recent Results (from the past 240 hour(s))  Body fluid culture     Status: None (Preliminary result)   Collection Time: 02/03/15  8:23 PM  Result Value Ref Range Status   Specimen Description SYNOVIAL  Final   Special Requests  Normal  Final   Gram Stain   Final    CYTOSPIN WBC PRESENT, PREDOMINANTLY PMN NO ORGANISMS SEEN Gram Stain Report Called to,Read Back By and Verified With: L ADKINS RN 2136 02/03/15 A NAVARRO    Culture   Final    CULTURE REINCUBATED FOR BETTER GROWTH CRITICAL RESULT CALLED TO, READ BACK BY AND VERIFIED WITH: P CLARK 02/05/15 @ 1446 M VESTAL Performed at [facility]    Report Status PENDING  Incomplete  Blood culture (routine x 2)     Status: None (Preliminary result)   Collection Time: 02/05/15  4:30 PM  Result Value Ref Range Status   Specimen  Description BLOOD RIGHT FOREARM  Final   Special Requests BOTTLES DRAWN AEROBIC AND ANAEROBIC 5ML  Final   Culture   Final    NO GROWTH < 24 HOURS Per[facility]    Report Status PENDING  Incomplete  Blood culture (routine x 2)     Status: None (Preliminary result)   Collection Time: 02/05/15  6:40 PM  Result Value Ref Range Status   Specimen Description BLOOD LEFT ANTECUBITAL  Final   Special Requests BOTTLES DRAWN AEROBIC AND ANAEROBIC 5ML  Final   Culture   Final    NO GROWTH < 24 HOURS [facility]    Report Status PENDING  Incomplete  Body fluid culture     Status: None (Preliminary result)   Collection Time: 02/06/15 12:52 AM  Result Value Ref Range Status   Specimen Description SYNOVIAL RIGHT KNEE  Final   Special Requests NONE  Final   Gram Stain   Final    ABUNDANT WBC PRESENT,BOTH PMN AND MONONUCLEAR NO ORGANISMS SEEN    Culture PENDING  Incomplete   Report Status PENDING  Incomplete   Studies/Results: No results found. Medications:  Scheduled Meds: . abacavir  600 mg Oral Daily   And  . lamiVUDine  300 mg Oral Daily  . amLODipine  10 mg Oral Daily  . cefTRIAXone (ROCEPHIN)  IV  2 g Intravenous Q24H  . docusate sodium  100 mg Oral BID  . dolutegravir  50 mg Oral Daily  . heparin  5,000 Units Subcutaneous 3 times per day  . [START ON 02/07/2015] Influenza vac split quadrivalent PF  0.5 mL Intramuscular Tomorrow-1000  . lisinopril  40 mg Oral Daily   Continuous Infusions: . sodium chloride 100 mL/hr at 02/06/15 1258   PRN Meds:.acetaminophen **OR** acetaminophen, HYDROmorphone (DILAUDID) injection, metoCLOPramide **OR** metoCLOPramide (REGLAN) injection, ondansetron **OR** ondansetron (ZOFRAN) IV, oxyCODONE, polyethylene glycol Assessment/Plan: Principal Problem:   Septic arthritis (HCC) Active Problems:   HYPERTENSION, BENIGN ESSENTIAL   HIV (human immunodeficiency virus infection) (HCC)   Altered bowel elimination  due to intestinal ostomy (HCC)   Septic joint (HCC)   Perianal abscess  Septic Arthritis: Status-pose washout by orthopedic surgery.  -Appreciate ortho surgery recommendation [ ]  Blood cultures x 2 [ ]  Synovial fluid culture pending - Continue ceftriaxone for now pending cultures - WBC this morning 10.3, continue to follow  Perianal Abscesses s/p sigmoid colostomy - Contacted surgery about seeing patient, but nothing urgently to be done in the hospital.  Patient has missed multiple appointments with Dr. Maisie Fushomas so patient will have to schedule follow up on his own with them.  Contact information will be given to patient at discharge   HIV: 04/25/14 CD4 770, VL 46, followed by Dr. Drue SecondSnider [ ]  CD4, VL [ ]  Discuss with Dr. Drue SecondSnider  regarding switching ART to Triumeq - Continue home abacavir-lamivudine 600-300 mg 1 tablet daily, Continue home dolutegravir 50 mg daily  CMP, lipid panel, GC/CT, RPR  Hearing loss: Apparently due to zoster outbreak. Lateralizes to right side on exam. Will require a more thorough otoscopic evaluation.  - Follow up outpatient  HTN: stable - Continue home amlodipine 10 mg and lisinopril 40 mg  Diet: Regular  DVT Prophylaxis: Heparin [place]  Code Status: Full  Dispo: Disposition is deferred at this time, awaiting improvement of current medical problems.    The patient does have a current PCP Darrick Huntsman, MD) and does need an [facility] hospital follow-up appointment after discharge.  The patient does not have transportation limitations that hinder transportation to clinic appointments.  .Services Needed at time of discharge: Y = Yes, Blank = No PT:   OT:   RN:   Equipment:   Other:     LOS: 1 day   Gwynn Burly, DO 02/06/2015, 4:33 PM

## 2015-02-07 DIAGNOSIS — M009 Pyogenic arthritis, unspecified: Secondary | ICD-10-CM | POA: Diagnosis not present

## 2015-02-07 LAB — LIPID PANEL
CHOLESTEROL: 78 mg/dL (ref 0–200)
HDL: 12 mg/dL — ABNORMAL LOW (ref >40)
LDL CALC: 53 mg/dL (ref 0–99)
TRIGLYCERIDES: 63 mg/dL (ref <150)
Total CHOL/HDL Ratio: 6.5 RATIO
VLDL: 13 mg/dL (ref 0–40)

## 2015-02-07 LAB — COMPREHENSIVE METABOLIC PANEL
ALK PHOS: 56 U/L (ref 38–126)
ALT: 11 U/L — AB (ref 17–63)
AST: 18 U/L (ref 15–41)
Albumin: 2.1 g/dL — ABNORMAL LOW (ref 3.5–5.0)
Anion gap: 9 (ref 5–15)
BILIRUBIN TOTAL: 0.6 mg/dL (ref 0.3–1.2)
BUN: 12 mg/dL (ref 6–20)
CALCIUM: 8.7 mg/dL — AB (ref 8.9–10.3)
CO2: 28 mmol/L (ref 22–32)
CREATININE: 0.86 mg/dL (ref 0.61–1.24)
Chloride: 97 mmol/L — ABNORMAL LOW (ref 101–111)
Glucose, Bld: 86 mg/dL (ref 65–99)
Potassium: 3.3 mmol/L — ABNORMAL LOW (ref 3.5–5.1)
Sodium: 134 mmol/L — ABNORMAL LOW (ref 135–145)
Total Protein: 6.6 g/dL (ref 6.5–8.1)

## 2015-02-07 LAB — HIV-1 RNA QUANT-NO REFLEX-BLD
HIV 1 RNA Quant: <20 copies/mL
LOG10 HIV-1 RNA: UNDETERMINED log10copy/mL

## 2015-02-07 LAB — CBC WITH DIFFERENTIAL/PLATELET
Basophils Absolute: 0 10*3/uL (ref 0.0–0.1)
Basophils Relative: 0 %
Eosinophils Absolute: 0.1 10*3/uL (ref 0.0–0.7)
Eosinophils Relative: 1 %
HEMATOCRIT: 33 % — AB (ref 39.0–52.0)
HEMOGLOBIN: 10.8 g/dL — AB (ref 13.0–17.0)
LYMPHS ABS: 2.3 10*3/uL (ref 0.7–4.0)
Lymphocytes Relative: 32 %
MCH: 31.8 pg (ref 26.0–34.0)
MCHC: 32.7 g/dL (ref 30.0–36.0)
MCV: 97.1 fL (ref 78.0–100.0)
MONOS PCT: 17 %
Monocytes Absolute: 1.2 10*3/uL — ABNORMAL HIGH (ref 0.1–1.0)
NEUTROS ABS: 3.6 10*3/uL (ref 1.7–7.7)
NEUTROS PCT: 50 %
Platelets: 391 10*3/uL (ref 150–400)
RBC: 3.4 MIL/uL — ABNORMAL LOW (ref 4.22–5.81)
RDW: 15.7 % — ABNORMAL HIGH (ref 11.5–15.5)
WBC: 7.1 10*3/uL (ref 4.0–10.5)

## 2015-02-07 LAB — T-HELPER CELLS (CD4) COUNT (NOT AT ARMC)
CD4 T CELL ABS: 470 /uL (ref 400–2700)
CD4 T CELL HELPER: 31 % — AB (ref 33–55)

## 2015-02-07 LAB — RPR: RPR Ser Ql: NONREACTIVE

## 2015-02-07 LAB — GC/CHLAMYDIA PROBE AMP (~~LOC~~) NOT AT ARMC
Chlamydia: NEGATIVE
Neisseria Gonorrhea: NEGATIVE

## 2015-02-07 MED ORDER — ABACAVIR-DOLUTEGRAVIR-LAMIVUD 600-50-300 MG PO TABS: 1.0000 | ORAL_TABLET | Freq: Every day | ORAL | Status: DC

## 2015-02-07 MED ORDER — ONDANSETRON HCL 4 MG PO TABS: 4.0000 mg | ORAL_TABLET | Freq: Four times a day (QID) | ORAL | Status: DC | PRN

## 2015-02-07 MED ORDER — DEXTROSE 5 % IV SOLN: 1.0000 g | INTRAVENOUS | Status: DC

## 2015-02-07 MED ORDER — SODIUM CHLORIDE 0.9 % IJ SOLN
10.0000 mL | INTRAMUSCULAR | Status: DC | PRN
  Administered 2015-02-07: 10 mL
  Filled 2015-02-07: qty 40

## 2015-02-07 MED ORDER — POTASSIUM CHLORIDE CRYS ER 20 MEQ PO TBCR
40.0000 meq | EXTENDED_RELEASE_TABLET | Freq: Two times a day (BID) | ORAL | Status: DC
  Administered 2015-02-07 – 2015-02-08 (×3): 40 meq via ORAL
  Filled 2015-02-07 (×3): qty 2

## 2015-02-07 MED ORDER — AZITHROMYCIN 500 MG PO TABS
1000.0000 mg | ORAL_TABLET | Freq: Every day | ORAL | Status: DC
  Administered 2015-02-08: 1000 mg via ORAL
  Filled 2015-02-07 (×3): qty 2

## 2015-02-07 NOTE — Progress Notes (Signed)
Physical Therapy Treatment Patient Details Name: Leon Nichols MRN: [id number] DOB: March 23, 1966 Today's Date: 02/07/2015    History of Present Illness 49 year old gentleman with past medical history of HIV, perirectal/perianal abscesses s/p diverting colostomy (2013), and HTN who presents with right knee pain. Underwent ARTHROSCOPIC WASH OUT RIGHT KNEE.     PT Comments    Patient is making gradual progress with PT from a mobility standpoint. Patient able to ambulate 30 feet with rw which he reports as the distance from the car to the front steps. Stairs performed with patient going up/down 2 steps with min assist and crutches. Improved overall activity tolerance today with gains in ambulation and ability to perform stairs. Anticipate patient will be ultimately DC home with family assistance. Patient denies any questions or concerns following session. Will continue to follow.       Follow Up Recommendations  Home health PT;Supervision for mobility/OOB     Equipment Recommendations  None recommended by PT;Other (comment) (patient reports having Landa and crutches at home. )    Recommendations for Other Services       Precautions / Restrictions Precautions Precautions: Fall Restrictions Weight Bearing Restrictions: Yes    Mobility  Bed Mobility Overal bed mobility: Needs Assistance Bed Mobility: Supine to Sit     Supine to sit: Supervision (using rails to assist) Sit to supine: Min assist (with Rt LE)   General bed mobility comments: Patient requesting return to bed for colostomy care  Transfers Overall transfer level: Needs assistance Equipment used: Rolling [place] (2 wheeled) Transfers: Sit to/from Stand Sit to Stand: Supervision         General transfer comment: using UEs to assist with standing, minimal weight bearing through Rt LE  Ambulation/Gait Ambulation/Gait assistance: Supervision Ambulation Distance (Feet): 50 Feet (30 X1, 20X1) Assistive device:  Rolling Szalkowski (2 wheeled) Gait Pattern/deviations:  (swing-to pattern) Gait velocity: decreased   General Gait Details: patient refusing to attempt any weightbearing through Rt LE despite encouragement.    Stairs Stairs: Yes Stairs assistance: Min guard Stair Management: No rails;Step to pattern;Forwards;With crutches Number of Stairs: 2 General stair comments: Cues and physical assistance during stairs. Assistance is to ensure safety but no physical assistance needed for going up/down steps. Patient reports that he feels confident with stairs after session and declined a second attempt. Patient also reporting that he does have crutches at home.   Wheelchair Mobility    Modified Rankin (Stroke Patients Only)       Balance Overall balance assessment: Needs assistance Sitting-balance support: No upper extremity supported Sitting balance-Leahy Scale: Good     Standing balance support: During functional activity Standing balance-Leahy Scale: Fair Standing balance comment: using rw, able to stand static at sink for hygine tasks.                     Cognition Arousal/Alertness: Awake/alert Behavior During Therapy: WFL for tasks assessed/performed Overall Cognitive Status: Within Functional Limits for tasks assessed                      Exercises      General Comments        Pertinent Vitals/Pain Pain Assessment: Faces Faces Pain Scale: Hurts even more Pain Location: Rt knee Pain Descriptors / Indicators: Grimacing;Moaning Pain Intervention(s): Limited activity within patient's tolerance;Monitored during session    Home Living  Prior Function            PT Goals (current goals can now be found in the care plan section) Acute Rehab PT Goals Patient Stated Goal: have less pain  PT Goal Formulation: With patient Time For Goal Achievement: 02/20/15 Potential to Achieve Goals: Good Progress towards PT goals: Progressing  toward goals    Frequency  Min 5X/week    PT Plan Current plan remains appropriate    Co-evaluation             End of Session Equipment Utilized During Treatment: Gait belt Activity Tolerance: Patient limited by pain Patient left: in bed;with call bell/phone within reach     Time: 0907-0939 PT Time Calculation (min) (ACUTE ONLY): 32 min  Charges:  $Gait Training: 23-37 mins                    G Codes:      Christiane Ha, PT, CSCS Pager [phone] Office [phone]  02/07/2015, 10:10 AM

## 2015-02-07 NOTE — Progress Notes (Signed)
Ceftriaxone for septic arthritis per pharmacy ordered.  Ceftriaxone does not need renal adjustment.  [employer] policy allows pharmacy to change the ordered dose based on indication without contacting the physician, therefore a consult is not needed for pharmacy to intervene on dosing regimen.   Plan: -ceftriaxone 2g IV q24h -pharmacy to sign off as no adjustment needed, however will follow peripherally (we are flagged d/t patient's HIV regimen).  Caleah Tortorelli D. Glenis Musolf, PharmD, BCPS Clinical Pharmacist Pager: [phone] 02/07/2015 10:50 AM

## 2015-02-07 NOTE — Progress Notes (Signed)
Called wound nurse Flonnie Hailstoneawn Enges regarding Mr Dan HumphreysWalker peri rectal abcesses

## 2015-02-07 NOTE — Progress Notes (Signed)
Occupational Therapy Treatment Patient Details Name: Leon Nichols MRN: [id number] DOB: Jun 18, 1965 Today's Date: 02/07/2015    History of present illness 49 year old gentleman with past medical history of HIV, perirectal/perianal abscesses s/p diverting colostomy (2013), and HTN who presents with right knee pain. Underwent ARTHROSCOPIC WASH OUT RIGHT KNEE.    OT comments  Pt able to perform standing toileting and grooming at sink with RW and supervision.  Pt educated in use and availability of AE and tub bench, but does not want to incur the out of pocket expense. Will sponge bathe and ask is dad to assist with donning R LE until he able to perform independently.  Follow Up Recommendations  No OT follow up;Supervision - Intermittent    Equipment Recommendations  None recommended by OT    Recommendations for Other Services      Precautions / Restrictions Precautions Precautions: Fall Restrictions RLE Weight Bearing: Weight bearing as tolerated       Mobility Bed Mobility   Bed Mobility: Supine to Sit;Sit to Supine     Supine to sit: Supervision;HOB elevated Sit to supine: Min assist      Transfers Overall transfer level: Needs assistance Equipment used: Rolling Artley (2 wheeled)   Sit to Stand: Supervision         General transfer comment: using UEs to assist with standing, minimal weight bearing through Rt LE    Balance     Sitting balance-Leahy Scale: Good       Standing balance-Leahy Scale: Fair                     ADL Overall ADL's : Needs assistance/impaired     Grooming: Wash/dry hands;Supervision/safety;Standing       Lower Body Bathing: Minimal assistance;Sit to/from stand Lower Body Bathing Details (indicate cue type and reason): pt is aware of availability of long handle sponge Upper Body Dressing : Set up;Sitting   Lower Body Dressing: Minimal assistance;Sit to/from stand Lower Body Dressing Details (indicate cue type and  reason): pt is aware of availability of AE for LB ADL Toilet Transfer: Min guard;Ambulation;RW   Toileting- Clothing Manipulation and Hygiene: Sit to/from stand;Supervision/safety Toileting - Clothing Manipulation Details (indicate cue type and reason): stood to urinate   Tub/Shower Transfer Details (indicate cue type and reason): Pt plans to sponge bathe until he is able to perform tub transfer. Functional mobility during ADLs: Min guard;Rolling Kelner (increased time)        Vision                     Perception     Praxis      Cognition   Behavior During Therapy: WFL for tasks assessed/performed Overall Cognitive Status: Within Functional Limits for tasks assessed                       Extremity/Trunk Assessment               Exercises     Shoulder Instructions       General Comments      Pertinent Vitals/ Pain       Pain Assessment: 0-10 Pain Score: 6  Pain Location: R knee Pain Descriptors / Indicators: Aching Pain Intervention(s): Limited activity within patient's tolerance;Monitored during session;Ice applied;Repositioned;Patient requesting pain meds-RN notified  Home Living  Prior Functioning/Environment              Frequency Min 2X/week     Progress Toward Goals  OT Goals(current goals can now be found in the care plan section)  Progress towards OT goals: Progressing toward goals  Acute Rehab OT Goals Patient Stated Goal: have less pain   Plan Discharge plan remains appropriate    Co-evaluation                 End of Session Equipment Utilized During Treatment: Gait belt;Rolling Delsignore   Activity Tolerance Patient limited by pain   Patient Left in bed;with call bell/phone within reach   Nurse Communication Patient requests pain meds        Time: 1610-9604 OT Time Calculation (min): 18 min  Charges: OT General Charges $OT Visit: 1  Procedure OT Treatments $Self Care/Home Management : 8-22 mins  Evern Bio 02/07/2015, 3:53 PM  [phone]

## 2015-02-07 NOTE — Progress Notes (Addendum)
Date: 02/07/2015  Patient name: Leon Nichols  Medical record number: [id number]  Date of birth: [birth year]   This patient's plan of care was discussed with the house staff. Please see their note for complete details. I concur with their findings.  1. HIV Will continue on triumeq as outpt.  Will have him f/u with Dr Drue Second  2. Septic Arthritis Spoke with lab- this appears to be GC Will f/u Cx Will go home with ceftriaxone for total of 2 weeks of therapy.  My great appreciation to pharmacy  3. Hx of Drug use Pt denies recent use.  Has contracted for safety regarding pic at home.   4. Rectal Abscesses He will f/u with Dr Lester Kinsman, MD 02/07/2015, 12:19 PM

## 2015-02-07 NOTE — Care Management Note (Signed)
Case Management Note  Patient Details  Name: Leon Nichols MRN: [id number] Date of Birth: December 31, 1965  Subjective/Objective:    49 yr old male s/p arthroscopic wash-out of right knee              Action/Plan:  Patient will need 4 weeks of IV Ceftrioxone. PICC line being placed. Referral called to [facility]anda, [facility], and to Jeri ModenaPam Chandler, [facility] IV Infusion coordinator.   Expected Discharge Date:     02/07/15             Expected Discharge Plan:   Home with Home Health  In-House Referral:     Discharge planning Services  CM Consult  Post Acute Care Choice:  Home Health Choice offered to:  Patient  DME Arranged:  IV pump/equipment DME Agency:  [facility]  HH Arr[facility]  [facility]  Status of Service:  Completed, signed off  Medicare Important Message Given:    Date Medicare IM Given:    Medicare IM give by:    Date Additional Medicare IM Given:    Additional Medicare Important Message give by:     If discussed at Long Length of Stay Meetings, dates discussed:    Additional Comments:  Durenda GuthrieBrady, Ethelwyn Gilbertson Naomi, RN 02/07/2015, 1:22 PM

## 2015-02-07 NOTE — Progress Notes (Signed)
Peripherally Inserted Central Catheter/Midline Placement  The IV Nurse has discussed with the patient and/or persons authorized to consent for the patient, the purpose of this procedure and the potential benefits and risks involved with this procedure.  The benefits include less needle sticks, lab draws from the catheter and patient may be discharged home with the catheter.  Risks include, but not limited to, infection, bleeding, blood clot (thrombus formation), and puncture of an artery; nerve damage and irregular heat beat.  Alternatives to this procedure were also discussed.  PICC/Midline Placement Documentation        Maximino GreenlandLumban, Yaretzi Ernandez Albarece 02/07/2015, 1:31 PM

## 2015-02-07 NOTE — Discharge Instructions (Signed)
Weight bearing as tolerated in the R knee  Ok to shower after three days and remove all dressings.  No soakings or tub baths until follow up.  Please make sure to attend all your scheduled follow up appointments.

## 2015-02-07 NOTE — Clinical Documentation Improvement (Signed)
Infectious Disease Internal Medicine  Can the diagnosis of "HIV infection" be further specified? Thank you   AIDS/HIV disease  Other  Clinically Undetermined     Include previous CD4 or CD4% count or AIDS defining condition, if applicable.  The CDC defines AIDS as present in an HIV positive patient who has or has had any of the following:  - Current or prior  history of CD4+ T-lymphocyte count <200 cells/uL  - Current or prior  history CD4+ T-lymphocyte count < 14% of total lymphocytes    - Current or prior  diagnosis of an AIDS-defining condition      Supporting Information:  Patient diagnosed in 2013   Please exercise your independent, professional judgment when responding. A specific answer is not anticipated or expected.   Thank You,  Lavonda JumboLawanda J Zoey [facility] [place] [phone]

## 2015-02-07 NOTE — Care Management Important Message (Signed)
Important Message  Patient Details  Name: Lanny HurstWayne H Siefert MRN: [id number] Date of Birth: [birth year]   Medicare Important Message Given:  Yes-second notification given    Orson AloeMegan P Sair Faulcon 02/07/2015, 1:28 PM

## 2015-02-07 NOTE — Progress Notes (Signed)
     Subjective:  POD#2 I/D of R septic knee. Patient reports pain as mild to moderate.  Resting comfortably in bed this morning.  Patient reports that his pain and ROM seems to be improving in the knee.   Objective:   VITALS:   Filed Vitals:   02/06/15 0452 02/06/15 1300 02/06/15 2035 02/07/15 0430  BP: 108/65 110/71 125/81 130/65  Pulse: 92 89 85 90  Temp: 98.4 F (36.9 C) 98.2 F (36.8 C) 97.9 F (36.6 C) 98.5 F (36.9 C)  TempSrc: Oral Oral Oral Oral  Resp: 16 17 18 18   Height:      Weight:      SpO2: 96% 98% 98% 100%    Neurologically intact ABD soft Neurovascular intact Sensation intact distally Intact pulses distally Dorsiflexion/Plantar flexion intact Incision: dressing C/D/I   Lab Results  Component Value Date   WBC 7.1 02/07/2015   HGB 10.8* 02/07/2015   HCT 33.0* 02/07/2015   MCV 97.1 02/07/2015   PLT 391 02/07/2015   BMET    Component Value Date/Time   NA 134* 02/07/2015 0530   K 3.3* 02/07/2015 0530   CL 97* 02/07/2015 0530   CO2 28 02/07/2015 0530   GLUCOSE 86 02/07/2015 0530   BUN 12 02/07/2015 0530   CREATININE 0.86 02/07/2015 0530   CREATININE 0.84 04/25/2014 1018   CALCIUM 8.7* 02/07/2015 0530   GFRNONAA >60 02/07/2015 0530   GFRNONAA >89 04/25/2014 1018   GFRAA >60 02/07/2015 0530   GFRAA >89 04/25/2014 1018     Assessment/Plan: 2 Days Post-Op   Principal Problem:   Septic arthritis (HCC) Active Problems:   HYPERTENSION, BENIGN ESSENTIAL   HIV (human immunodeficiency virus infection) (HCC)   Altered bowel elimination due to intestinal ostomy (HCC)   Septic joint (HCC)   Perianal abscess   Up with therapy WBAT in the RLE Ok from ortho standpoint to discharge once abx have been decided.  Will continue to follow the patient as outpatient.   Aza Dantes Hilda LiasMarie 02/07/2015, 6:55 AM Cell [phone]

## 2015-02-07 NOTE — Progress Notes (Signed)
Subjective: Patient is POD #2 following arthroscopic washout of a septic right knee by Dr. Eulah Pont.  No acute events overnight.  Reports pain improving in surgically repaired knee.  Voiced understanding of needing to schedule outpatient follow up with general surgery regarding perianal abscesses and colostomy.  Denies chest pain, SOB.  No other complaints.  Objective: Vital signs in last 24 hours: Filed Vitals:   02/06/15 2035 02/07/15 0430 02/07/15 0849 02/07/15 0850  BP: 125/81 130/65 130/65 130/65  Pulse: 85 90    Temp: 97.9 F (36.6 C) 98.5 F (36.9 C)    TempSrc: Oral Oral    Resp: 18 18    Height:      Weight:      SpO2: 98% 100%     Weight change:   Intake/Output Summary (Last 24 hours) at 02/07/15 1109 Last data filed at 02/06/15 1300  Gross per 24 hour  Intake    240 ml  Output      0 ml  Net    240 ml   General: resting in bed HEENT: EOMI, no scleral icterus Cardiac: RRR, no rubs, murmurs or gallops Pulm: clear to auscultation bilaterally, moving normal volumes of air Ext: warm and well perfused, no pedal edema Neuro: alert and oriented X3, cranial nerves II-XII grossly intact  Lab Results: Basic Metabolic Panel:  Recent Labs Lab 02/05/15 1952 02/07/15 0530  NA 136 134*  K 3.3* 3.3*  CL 99* 97*  CO2 25 28  GLUCOSE 157* 86  BUN 25* 12  CREATININE 1.04 0.86  CALCIUM 8.8* 8.7*   Liver Function Tests:  Recent Labs Lab 02/07/15 0530  AST 18  ALT 11*  ALKPHOS 56  BILITOT 0.6  PROT 6.6  ALBUMIN 2.1*   No results for input(s): LIPASE, AMYLASE in the last 168 hours. No results for input(s): AMMONIA in the last 168 hours. CBC:  Recent Labs Lab 02/05/15 1952 02/06/15 0540 02/07/15 0530  WBC 10.7* 10.3 7.1  NEUTROABS 8.0*  --  3.6  HGB 13.7 11.9* 10.8*  HCT 39.6 36.0* 33.0*  MCV 95.4 96.3 97.1  PLT 506* 407* 391   Cardiac Enzymes: No results for input(s): CKTOTAL, CKMB, CKMBINDEX, TROPONINI in the last 168 hours. BNP: No results for  input(s): PROBNP in the last 168 hours. D-Dimer: No results for input(s): DDIMER in the last 168 hours. CBG:  Recent Labs Lab 02/03/15 2015  GLUCAP 119*   Hemoglobin A1C: No results for input(s): HGBA1C in the last 168 hours. Fasting Lipid Panel:  Recent Labs Lab 02/07/15 0530  CHOL 78  HDL 12*  LDLCALC 53  TRIG 63  CHOLHDL 6.5   Thyroid Function Tests: No results for input(s): TSH, T4TOTAL, FREET4, T3FREE, THYROIDAB in the last 168 hours. Coagulation: No results for input(s): LABPROT, INR in the last 168 hours. Anemia Panel: No results for input(s): VITAMINB12, FOLATE, FERRITIN, TIBC, IRON, RETICCTPCT in the last 168 hours. Urine Drug Screen: Drugs of Abuse     Component Value Date/Time   LABOPIA NONE DETECTED 02/02/2014 1345   COCAINSCRNUR POSITIVE* 02/02/2014 1345   LABBENZ NONE DETECTED 02/02/2014 1345   AMPHETMU NONE DETECTED 02/02/2014 1345   THCU NONE DETECTED 02/02/2014 1345   LABBARB NONE DETECTED 02/02/2014 1345    Alcohol Level: No results for input(s): ETH in the last 168 hours. Urinalysis: No results for input(s): COLORURINE, LABSPEC, PHURINE, GLUCOSEU, HGBUR, BILIRUBINUR, KETONESUR, PROTEINUR, UROBILINOGEN, NITRITE, LEUKOCYTESUR in the last 168 hours.  Invalid input(s): APPERANCEUR Misc. Labs: none  Micro Results: Recent Results (from the past 240 hour(s))  Body fluid culture     Status: None (Preliminary result)   Collection Time: 02/03/15  8:23 PM  Result Value Ref Range Status   Specimen Description SYNOVIAL  Final   Special Requests Normal  Final   Gram Stain   Final    CYTOSPIN WBC PRESENT, PREDOMINANTLY PMN NO ORGANISMS SEEN Gram Stain Report Called to,Read Back By and Verified With: L ADKINS RN 2136 02/03/15 A NAVARRO    Culture   Final    CULTURE REINCUBATED FOR BETTER GROWTH CRITICAL RESULT CALLED TO, READ BACK BY AND VERIFIED WITH: P CLARK 02/05/15 @ 1446 M VESTAL Performed at [facility]    Report Status PENDING   Incomplete  Blood culture (routine x 2)     Status: None (Preliminary result)   Collection Time: 02/05/15  4:30 PM  Result Value Ref Range Status   Specimen Description BLOOD RIGHT FOREARM  Final   Special Requests BOTTLES DRAWN AEROBIC AND ANAEROBIC  Final   Culture   Final    NO GROWTH < 24 HOURS Performed at [facility]    Report Status PENDING  Incomplete  Blood culture (routine x 2)     Status: None (Preliminary result)   Collection Time: 02/05/15  6:40 PM  Result Value Ref Range Status   Specimen Description BLOOD LEFT ANTECUBITAL  Final   Special Requests BOTTLES DRAWN AEROBIC AND ANAEROBIC  Final   Culture   Final    NO GROWTH < 24 HOURS Performed at [facility]    Report Status PENDING  Incomplete  Body fluid culture     Status: None (Preliminary result)   Collection Time: 02/06/15 12:52 AM  Result Value Ref Range Status   Specimen Description SYNOVIAL RIGHT KNEE  Final   Special Requests NONE  Final   Gram Stain   Final    ABUNDANT WBC PRESENT,BOTH PMN AND MONONUCLEAR NO ORGANISMS SEEN    Culture PENDING  Incomplete   Report Status PENDING  Incomplete   Studies/Results: No results found. Medications:  Scheduled Meds: . abacavir  600 mg Oral Daily   And  . lamiVUDine  300 mg Oral Daily  . amLODipine  10 mg Oral Daily  . cefTRIAXone (ROCEPHIN)  IV  2 g Intravenous Q24H  . docusate sodium  100 mg Oral BID  . dolutegravir  50 mg Oral Daily  . heparin  5,000 Units Subcutaneous 3 times per day  . Influenza vac split quadrivalent PF  0.5 mL Intramuscular Tomorrow-1000  . lisinopril  40 mg Oral Daily  . potassium chloride  40 mEq Oral BID   Continuous Infusions: . sodium chloride 100 mL/hr at 02/06/15 1258   PRN Meds:.acetaminophen **OR** acetaminophen, HYDROmorphone (DILAUDID) injection, metoCLOPramide **OR** metoCLOPramide (REGLAN) injection, ondansetron **OR** ondansetron (ZOFRAN) IV, oxyCODONE, polyethylene  glycol Assessment/Plan: Principal Problem:   Septic arthritis (HCC) Active Problems:   HYPERTENSION, BENIGN ESSENTIAL   HIV (human immunodeficiency virus infection) (HCC)   Altered bowel elimination due to intestinal ostomy (HCC)   Septic joint (HCC)   Perianal abscess  Septic Arthritis: Status-pose washout by orthopedic surgery, POD#2.  -Appreciate ortho surgery recommendation  Blood cultures x 2 NGTD   Synovial fluid gram stain with abundant WBC present, no organism seen taken from surgery.  Culture pending - Previous aspiration of joint from ED on 10/31 growing Neiserria - Continue ceftriaxone for 14 days total treatment (last day will  be 11/15) - Will give Azithromycin 1gm PO x 1 to cover for possible chlamydia coinfection - WBC this morning 7.1  Perianal Abscesses s/p sigmoid colostomy - Contacted surgery about seeing patient, but nothing urgently to be done in the hospital.  Patient has missed multiple appointments with Dr. Maisie Fushomas so patient will have to schedule follow up on his own with them.  Contact information will be given to patient at discharge   HIV: 04/25/14 CD4 770, VL 46, followed by Dr. Drue SecondSnider [ ]  CD4, VL - Will discharge home on Triumeq with outpatient f/u in the ID clinic [ ]  GC/CT, RPR pending - Lipid panel: TC 78, HDL 12, LDL 13 TG 63  - AST/ALT 18/11  Hearing loss: Apparently due to zoster outbreak. Lateralizes to right side on exam. Will require a more thorough otoscopic evaluation.  - Follow up outpatient  HTN: stable - Continue home amlodipine 10 mg and lisinopril 40 mg  FEN: Fluids: NS @ 100 mL/hr Electrolytes: replete as needed Nutrition: regular diet  DVT Prophylaxis: Heparin [place]  Code Status: Full  Dispo: Discharge today after PICC placement.  Will receive Ceftriaxone 1gm IV q24h for 14 total days.  The patient does have a current PCP Darrick Huntsman(William R Kennedy, MD) and [facility] after discharge.  The  patient does not have transportation limitations that hinder transportation to clinic appointments.  .Services Needed at time of discharge: Y = Yes, Blank = No PT:   OT:   RN:   Equipment:   Other:     LOS: 2 days   Gwynn BurlyAndrew Maezie Justin, DO 02/07/2015, 11:09 AM

## 2015-02-07 NOTE — Progress Notes (Signed)
[facility]  Patient Status[facility]  [facility] is providing the following services: HHRN and Home Infusion Pharmacy for home IV ABX.  Hospital infusion coordinator provided in hospital teaching with pt re: self administration of IV Rocephin for pt to be independent at home.  [facility] on Saturday, 02-08-15 for initial visit.  [facility] already has IV Rocephin script. Medications are set to be delivered on Saturday by 1 PM.    If patient discharges after hours, please call [phone].   Sedalia Mutaamela S Chandler 02/07/2015, 5:14 PM

## 2015-02-07 NOTE — Discharge Summary (Signed)
Name: Leon Nichols MRN: [id number] DOB: [birth year] 49 y.o. PCP: Darrick Huntsman, MD  Date of Admission: 02/05/2015  5:43 PM Date of Discharge: 02/08/2015 Attending Physician: No att. providers found  Discharge Diagnosis: 1.  Septic arthritis of right knee  Principal Problem:   Septic arthritis (HCC) Active Problems:   HYPERTENSION, BENIGN ESSENTIAL   HIV (human immunodeficiency virus infection) (HCC)   Altered bowel elimination due to intestinal ostomy (HCC)   Septic joint (HCC)   Perianal abscess  Discharge Medications:   Medication List    STOP taking these medications        EPZICOM 600-300 MG tablet  Generic drug:  abacavir-lamiVUDine     TIVICAY 50 MG tablet  Generic drug:  dolutegravir      TAKE these medications        Abacavir-Dolutegravir-Lamivud 600-50-300 MG Tabs  Commonly known as:  TRIUMEQ  Take 1 tablet by mouth daily.     amLODipine 10 MG tablet  Commonly known as:  NORVASC  Take 1 tablet (10 mg total) by mouth daily.     cefTRIAXone 1 g in dextrose 5 % 50 mL  Inject 1 g into the vein daily.     ibuprofen 200 MG tablet  Commonly known as:  ADVIL,MOTRIN  Take 800 mg by mouth every 4 (four) hours as needed for headache or moderate pain.     ibuprofen 800 MG tablet  Commonly known as:  ADVIL,MOTRIN  Take 1 tablet (800 mg total) by mouth 3 (three) times daily.     lisinopril 40 MG tablet  Commonly known as:  PRINIVIL,ZESTRIL  Take 1 tablet (40 mg total) by mouth daily.     naproxen sodium 220 MG tablet  Commonly known as:  ANAPROX  Take 660 mg by mouth daily as needed (pain).     ondansetron 4 MG tablet  Commonly known as:  ZOFRAN  Take 1 tablet (4 mg total) by mouth every 6 (six) hours as needed for nausea.     Ostomy Supplies POUCH Misc  1 Container by Does not apply route daily.     oxyCODONE-acetaminophen 5-325 MG tablet  Commonly known as:  PERCOCET  Take 1-2 tablets by mouth every 4 (four) hours as needed for severe pain.        Disposition and follow-up:   Mr.Kunio H Rolland was discharged from [facility] in Stable condition.  At the hospital follow up visit please address:  1.  That patient is receiving Ceftriaxone  IV q24h through his PICC line.  Patient to receive 14 days of IV antibiotics (last day of tx 11/15).  Orders were written for home health RN to remove PICC after last dose, but please have patient remind RN, too.  2.  That patient was able to obtain and is taking Triumeq for his HIV.  Prior to admission his regimen consisted of Epzicom and Tivicay.  However, after discussing with Dr. Drue Second we changed his regimen to Triumeq in order to simplify to 1 pill per day dosing (Triumeq = Epzicom + Tivicay).  3.  Remind patient to schedule follow up with ortho surgery and general surgery, if he has not already done so.  4.  Remind patient of his appointment with Dr. Drue Second at the ID clinic on 12/12.  5.  Hearing loss in left ear and need for possible ENT referral.  6.  Labs / imaging needed at time of follow-up: none  7.  Pending labs/ test  needing follow-up: blood cx, synovial fluid cx, GC/CT, CD4, HIV viral load  Follow-up Appointments: Follow-up Information    Follow up with MURPHY, TIMOTHY D, MD In 10 days.   Specialty:  Orthopedic Surgery   Contact information:   [address] [place] [address] [phone]       Call Vanita PandaHOMAS, ALICIA C., MD.   Specialty:  General Surgery   Why:  For post-hospital follow up regarding your peri-rectal abscess/fistulas.  Call to confirm and appointment date/time that works for you.  Do not miss your appointments.   Contact information:   [address] [place] [address] [phone]       Follow up with Karna DupesBilly R Kennedy, MD On 02/14/2015.   Specialty:  Internal Medicine   Why:  Appointment time at 9:45am   Contact information:   [address] [place] [address] [phone]       Follow up  with Judyann MunsonSNIDER, CYNTHIA, MD On 03/17/2015.   Specialty:  Infectious Diseases   Why:  Appointment time at 3:00pm   Contact information:   [address] [place] [address] [phone]       Follow up with [facility].   Why:  Home health for registered nurse and physical therapy; IV antibiotics also to be given. Please call number listed above for questions.    Contact information:   [address] [place] [address] [phone]       Discharge Instructions: Discharge Instructions    Weight bearing as tolerated    Complete by:  As directed   Laterality:  right  Extremity:  Lower           Consultations: Treatment Team:  Sheral Apleyimothy D Murphy, MD  Procedures Performed:  Dg Knee Complete 4 Views Right  02/03/2015  CLINICAL DATA:  Pain and swelling for 6 days.  No history of trauma EXAM: RIGHT KNEE - COMPLETE 4+ VIEW COMPARISON:  None. FINDINGS: Frontal, lateral, and bilateral oblique views were obtained. There is no demonstrable fracture or dislocation. There is a sizable joint effusion. There is moderate narrowing medially. There is spurring in the medial and lateral compartments as well as in the intercondylar region. No erosive change. IMPRESSION: Osteoarthritic change, most notably medially. Sizable joint effusion. No erosive change. No fracture or dislocation. Electronically Signed   By: Bretta BangWilliam  Woodruff III M.D.   On: 02/03/2015 16:58    2D Echo: none  Cardiac Cath: none  Admission HPI: Mr. Dan HumphreysWalker is a 49 year old gentleman with past medical history of HIV (04/25/14 CD4 770, VL 46, followed by Dr. Drue SecondSnider), perirectal/perianal abscesses s/p diverting colostomy (2013), and HTN who presents with right knee pain. This problem started on 10/26 and was associated with swelling. He took ibuprofen, which was ineffective. It continued to worsen until he could no longer walk on it, and he went to the ED on 10/31, when a arthrocentesis was  performed, showing 126,000 WBCs. He was discharged from the ED, and called back due to synovial fluid growing gram negative coccobacilli He denies any fever, joint discoloration, or recent trauma. He reports having a similar pain in his right joint in the past over the last few years, but it had never worsened to the point of seeking out medical attention, previously resolving on its own. His other complaint are sores around his anus which are quite painful to him, and he uses toilet paper to reduce chaffing  of these sores. Anal and rectal abscesses of been a persistent problem for him since 2013, and he is followed by a surgeon Dr. Jamey Ripa who performed the sigmoid colostomy after drainage of complex perirectal fistulas and abscesses. He has used Epsom salts to minimal effect. Mr. Edmiston other problem is left sided hearing loss and tinnitus, which he says arose after shingles. He reports being seen by an ENT about a year ago, but no intervention was made. Otherwise, he denies any headache, cough, shortness of breath, chest pain pain with urination, penile discharge, nausea, vomiting, change in ostomy output (empties twice a day), abdominal pain, or blackouts. He smokes occasionally on the weekends, drinks "as much as [he] can" on the weekends (1-2 fifths liquor plus beer), and he insufflates cocaine once per month - sometimes using the sharing dollar bills or straws. He denies IVDU, but IVDU is recorded in his EHR. He reports adherence to his HIV regimen.  He was evaluated by orthopedic surgeon, Margarita Rana, who recommended arthroscopic joint washout. During the operation, he was noted to have a medial/lateral meniscal tear and grade 2/3 chondral damage. The procedure was performed without complication. Today, he's noted to be afebrile with a leukocytosis to 10.9 with neutrophil predominance (8.1).   Hospital Course by problem list: Principal Problem:   Septic arthritis (HCC) Active Problems:    HYPERTENSION, BENIGN ESSENTIAL   HIV (human immunodeficiency virus infection) (HCC)   Altered bowel elimination due to intestinal ostomy (HCC)   Septic joint (HCC)   Perianal abscess   1. Septic arthritis: patient initially presented to the ED on 10/31 where joint aspirate grew gram negative coccobacillus.  He was called back to the ED when these results came back and subsequently underwent wash-out by orthopedic surgery on 11/2.  After speaking with microbiology lab, it looks as though the original aspirate is growing Neisseria, although exact organism is unknown at discharge.  Blood cultures obtained were no growth at time of discharge.  He received Ceftriaxone 2gm IV q24h.  He had a PICC line placed prior to discharge and orders placed to receive Ceftriaxone 1gm IV q24h at discharge.  Total duration of antibiotics will be 2 weeks (last day of treatment 11/15).  GC/CT was pending at discharge.  He was also treated for potential chlamydia coinfection with Azithromycin 1gm x 1 dose.  His pain was controlled with oral Oxycodone and he was discharged with Percocet PRN and Zofran PRN.  He is to follow up with orthopedic surgery within 10 days of discharge.   2. Perianal abscesses status post sigmoid colostomy: we contacted surgery about seeing patient, but nothing urgently to be done in the hospital. Patient has missed multiple appointments with Dr. Maisie Fus so patient will have to schedule follow up on his own with them. Contact information for their office given to patient at discharge  3. HIV: previously well controlled on Epzicom and Tivicay (follows with Dr. Drue Second).  Last CD4 and VL from January 2016 were 770 and 46, respectively.  Repeat CD4 and VL pending at discharge.  RPR was negative.  His ART regimen was simplified to Triumeq and he has follow up scheduled with Dr. Drue Second on 12/12.  4. Hearing Loss:  Apparently this is due to outbreak of zoster that is affecting his left ear and is a chronic  issue for him.  According to chart review, patient was to be referred to ENT in Sept 2015, however, it is unclear if he followed up with this.  Would likely benefit from referral back to ENT.  5. HTN: controlled on Amlodipine and Lisinopril.  6. History of substance abuse: Patient does have a history of drug use with cocaine, but denies recent use. He denies having ever used IV drugs.  He was contracted for safety regarding PICC line.  Discharge Vitals:   BP 152/92 mmHg  Pulse 95  Temp(Src) 99.4 F (37.4 C) (Oral)  Resp 18  Ht 6\' 1"  (1.854 m)  Wt 250 lb (113.399 kg)  BMI 32.99 kg/m2  SpO2 98%  Discharge Labs:  No results found for this or any previous visit (from the past 24 hour(s)).  Signed: Gwynn Burly, DO 02/09/2015, 8:29 AM    Services Ordered on Discharge: [facility] PT and RN Equipment Ordered on Discharge: none

## 2015-02-07 NOTE — Clinical Social Work Note (Signed)
CSW received referral for SNF.  Case discussed with case manager and plan is to discharge home.  CSW to sign off please re-consult if social work needs arise.  Kavir Savoca R. Oralee Rapaport, MSW, LCSWA [phone]  

## 2015-02-08 DIAGNOSIS — S83281D Other tear of lateral meniscus, current injury, right knee, subsequent encounter: Secondary | ICD-10-CM | POA: Diagnosis not present

## 2015-02-08 DIAGNOSIS — B20 Human immunodeficiency virus [HIV] disease: Secondary | ICD-10-CM | POA: Diagnosis not present

## 2015-02-08 DIAGNOSIS — M00861 Arthritis due to other bacteria, right knee: Secondary | ICD-10-CM | POA: Diagnosis not present

## 2015-02-08 DIAGNOSIS — I10 Essential (primary) hypertension: Secondary | ICD-10-CM | POA: Diagnosis not present

## 2015-02-08 DIAGNOSIS — K610 Anal abscess: Secondary | ICD-10-CM | POA: Diagnosis not present

## 2015-02-08 DIAGNOSIS — B9689 Other specified bacterial agents as the cause of diseases classified elsewhere: Secondary | ICD-10-CM | POA: Diagnosis not present

## 2015-02-08 MED ORDER — OXYCODONE-ACETAMINOPHEN 5-325 MG PO TABS: 1.0000 | ORAL_TABLET | ORAL | Status: DC | PRN

## 2015-02-08 NOTE — Progress Notes (Signed)
Subjective: No acute events overnight. Patient unable to be discharged last night, unclear why. Will discharge home today as has [facility] coming to house at 4pm for IV antibiotics.   He reports his pain is well controlled. Denies any fevers, chills.   Objective: Vital signs in last 24 hours: Filed Vitals:   02/07/15 0850 02/07/15 1408 02/07/15 2035 02/08/15 0516  BP: 130/65 130/80 143/99 165/98  Pulse:  96 98 99  Temp:  98.6 F (37 C) 99 F (37.2 C) 98.3 F (36.8 C)  TempSrc:  Oral Oral Oral  Resp:  18 18 17   Height:      Weight:      SpO2:  98% 99% 97%   Weight change:   Intake/Output Summary (Last 24 hours) at 02/08/15 0751 Last data filed at 02/07/15 2001  Gross per 24 hour  Intake     10 ml  Output      0 ml  Net     10 ml   Physical Exam General: alert, resting in bed, NAD HEENT: [place]/AT, EOMI, sclera anicteric, mucus membranes moist CV: RRR, no m/g/r Pulm: CTA bilaterally, breaths non-labored Ext: warm, no peripheral edema. Right knee wrapped in ace bandage.  Neuro: alert and oriented x 3  Lab Results: Basic Metabolic Panel:  Recent Labs Lab 02/05/15 1952 02/07/15 0530  NA 136 134*  K 3.3* 3.3*  CL 99* 97*  CO2 25 28  GLUCOSE 157* 86  BUN 25* 12  CREATININE 1.04 0.86  CALCIUM 8.8* 8.7*   Liver Function Tests:  Recent Labs Lab 02/07/15 0530  AST 18  ALT 11*  ALKPHOS 56  BILITOT 0.6  PROT 6.6  ALBUMIN 2.1*   No results for input(s): LIPASE, AMYLASE in the last 168 hours. No results for input(s): AMMONIA in the last 168 hours. CBC:  Recent Labs Lab 02/05/15 1952 02/06/15 0540 02/07/15 0530  WBC 10.7* 10.3 7.1  NEUTROABS 8.0*  --  3.6  HGB 13.7 11.9* 10.8*  HCT 39.6 36.0* 33.0*  MCV 95.4 96.3 97.1  PLT 506* 407* 391   Cardiac Enzymes: CBG:  Recent Labs Lab 02/03/15 2015  GLUCAP 119*   Hemoglobin A1C: No results for input(s): HGBA1C in the last 168 hours. Fasting Lipid Panel:  Recent Labs Lab 02/07/15 0530    CHOL 78  HDL 12*  LDLCALC 53  TRIG 63  CHOLHDL 6.5    Micro Results: Recent Results (from the past 240 hour(s))  Body fluid culture     Status: None (Preliminary result)   Collection Time: 02/03/15  8:23 PM  Result Value Ref Range Status   Specimen Description SYNOVIAL  Final   Special Requests Normal  Final   Gram Stain   Final    CYTOSPIN WBC PRESENT, PREDOMINANTLY PMN NO ORGANISMS SEEN Gram Stain Report Called to,Read Back By and Verified With: L ADKINS RN 2136 02/03/15 A NAVARRO    Culture   Final    CULTURE REINCUBATED FOR BETTER GROWTH CRITICAL RESULT CALLED TO, READ BACK BY AND VERIFIED WITH: P CLARK 02/05/15 @ 1446 M VESTAL Performed at [facility]    Report Status PENDING  Incomplete  Blood culture (routine x 2)     Status: None (Preliminary result)   Collection Time: 02/05/15  4:30 PM  Result Value Ref Range Status   Specimen Description BLOOD RIGHT FOREARM  Final   Special Requests BOTTLES DRAWN AEROBIC AND ANAEROBIC 5ML  Final   Culture   Final  NO GROWTH 2 DAYS Performed at [facility]    Report Status PENDING  Incomplete  Blood culture (routine x 2)     Status: None (Preliminary result)   Collection Time: 02/05/15  6:40 PM  Result Value Ref Range Status   Specimen Description BLOOD LEFT ANTECUBITAL  Final   Special Requests BOTTLES DRAWN AEROBIC AND ANAEROBIC  Final   Culture   Final    NO GROWTH 2 DAYS Performed at [facility]    Report Status PENDING  Incomplete  Anaerobic culture     Status: None (Preliminary result)   Collection Time: 02/06/15 12:51 AM  Result Value Ref Range Status   Specimen Description SYNOVIAL RIGHT KNEE  Final   Special Requests NONE  Final   Gram Stain   Final    NO ANAEROBES ISOLATED; CULTURE IN PROGRESS FOR 5 DAYS   Culture PENDING  Incomplete   Report Status PENDING  Incomplete  Body fluid culture     Status: None (Preliminary result)   Collection Time: 02/06/15 12:52 AM  Result Value  Ref Range Status   Specimen Description SYNOVIAL RIGHT KNEE  Final   Special Requests NONE  Final   Gram Stain   Final    ABUNDANT WBC PRESENT,BOTH PMN AND MONONUCLEAR NO ORGANISMS SEEN    Culture NO GROWTH 1 DAY  Final   Report Status PENDING  Incomplete   Medications: I have reviewed the patient's current medications. Scheduled Meds: . abacavir  600 mg Oral Daily   And  . lamiVUDine  300 mg Oral Daily  . amLODipine  10 mg Oral Daily  . azithromycin  1,000 mg Oral Daily  . cefTRIAXone (ROCEPHIN)  IV  2 g Intravenous Q24H  . docusate sodium  100 mg Oral BID  . dolutegravir  50 mg Oral Daily  . heparin  5,000 Units Subcutaneous 3 times per day  . lisinopril  40 mg Oral Daily  . potassium chloride  40 mEq Oral BID   Continuous Infusions: . sodium chloride 100 mL/hr at 02/06/15 1258   PRN Meds:.acetaminophen **OR** acetaminophen, HYDROmorphone (DILAUDID) injection, metoCLOPramide **OR** metoCLOPramide (REGLAN) injection, ondansetron **OR** ondansetron (ZOFRAN) IV, oxyCODONE, polyethylene glycol, sodium chloride Assessment/Plan:  Septic Arthritis of Right Knee: s/p washout by ortho on 11/2. POD#3. Aspiration fluid from 10/31 growing Neisseria per micro lab. Blood cultures with NGTD. Given azithromycin 1 mg x 1 yesterday to cover for possible chlamydia coinfection.  - Discharge home  - [facility] - Continue Ceftriaxone IV for 14 days total (last day 11/15) - Will follow up cultures at outpatient appt in [facility]  Perianal Abscesses s/p Sigmoid Colostomy:  - Patient to have outpatient follow up with surgery - Patient given contact information for surgery office  HIV: Followed by Dr. Drue Second. CD4 470 and VL < 20 on 11/3. Urine GC/Chlamydia negative but did not probe all orifices. RPR nonreactive, - Has follow up with Dr. Drue Second 12/12 - Discharge on Triumeq   DVT: Heparin SQ Diet: Regular  Dispo: Discharge home today  The patient does have a current  PCP Darrick Huntsman, MD) and does need an [facility] hospital follow-up appointment after discharge.  The patient does not have transportation limitations that hinder transportation to clinic appointments.  .Services Needed at time of discharge: Y = Yes, Blank = No PT:   OT:   RN:   Equipment:   Other:     LOS: 3 days   Raelynn Corron  Beckie Salts, MD, MPH Internal Medicine Resident, PGY-II Pager: [phone]

## 2015-02-08 NOTE — Progress Notes (Signed)
PT Cancellation Note  Patient Details Name: Leon Nichols MRN: [id number] DOB: [birth year]   Cancelled Treatment:    Reason Eval/Treat Not Completed: Other (comment) (Refused, stating he was headed home and hurts).  Asked pt to ask nursing to call PT if he changes his mind.   Ivar DrapeStout, Leon Nichols 02/08/2015, 12:08 PM   Samul Dadauth Zuriah Bordas, PT MS Acute Rehab Dept. Number: [facility] [phone] and [facility] [phone]

## 2015-02-08 NOTE — Progress Notes (Addendum)
CM received [facility] asking when patient in 5N06 could go home as he did not have a ride yesterday. CM went and spoke to patient who said that he has a ride today and his brother will be coming. His brother can be here in 45 minutes and will call once he gets his RX and discharge paperwork. CM called Tiffany with [facility] to advise that patient to go home today as he is to have home IV abx. Tiffany advised that someone will be there to administer IV abx between 1600-1800 today. Patient states that his father will be there to assist and no further needs communicated at this time.

## 2015-02-09 DIAGNOSIS — M00861 Arthritis due to other bacteria, right knee: Secondary | ICD-10-CM | POA: Diagnosis not present

## 2015-02-09 DIAGNOSIS — B9689 Other specified bacterial agents as the cause of diseases classified elsewhere: Secondary | ICD-10-CM | POA: Diagnosis not present

## 2015-02-09 DIAGNOSIS — K610 Anal abscess: Secondary | ICD-10-CM | POA: Diagnosis not present

## 2015-02-09 DIAGNOSIS — B20 Human immunodeficiency virus [HIV] disease: Secondary | ICD-10-CM | POA: Diagnosis not present

## 2015-02-09 DIAGNOSIS — I10 Essential (primary) hypertension: Secondary | ICD-10-CM | POA: Diagnosis not present

## 2015-02-09 DIAGNOSIS — S83281D Other tear of lateral meniscus, current injury, right knee, subsequent encounter: Secondary | ICD-10-CM | POA: Diagnosis not present

## 2015-02-09 LAB — BODY FLUID CULTURE: CULTURE: NO GROWTH

## 2015-02-10 DIAGNOSIS — I10 Essential (primary) hypertension: Secondary | ICD-10-CM | POA: Diagnosis not present

## 2015-02-10 DIAGNOSIS — M00861 Arthritis due to other bacteria, right knee: Secondary | ICD-10-CM | POA: Diagnosis not present

## 2015-02-10 DIAGNOSIS — B20 Human immunodeficiency virus [HIV] disease: Secondary | ICD-10-CM | POA: Diagnosis not present

## 2015-02-10 DIAGNOSIS — S83281D Other tear of lateral meniscus, current injury, right knee, subsequent encounter: Secondary | ICD-10-CM | POA: Diagnosis not present

## 2015-02-10 DIAGNOSIS — K610 Anal abscess: Secondary | ICD-10-CM | POA: Diagnosis not present

## 2015-02-10 DIAGNOSIS — B9689 Other specified bacterial agents as the cause of diseases classified elsewhere: Secondary | ICD-10-CM | POA: Diagnosis not present

## 2015-02-10 DIAGNOSIS — M009 Pyogenic arthritis, unspecified: Secondary | ICD-10-CM | POA: Diagnosis not present

## 2015-02-10 LAB — CULTURE, BLOOD (ROUTINE X 2)
Culture: NO GROWTH
Culture: NO GROWTH

## 2015-02-11 ENCOUNTER — Telehealth: Payer: Self-pay | Admitting: Infectious Disease

## 2015-02-11 DIAGNOSIS — M00861 Arthritis due to other bacteria, right knee: Secondary | ICD-10-CM | POA: Diagnosis not present

## 2015-02-11 DIAGNOSIS — B20 Human immunodeficiency virus [HIV] disease: Secondary | ICD-10-CM | POA: Diagnosis not present

## 2015-02-11 DIAGNOSIS — S83281D Other tear of lateral meniscus, current injury, right knee, subsequent encounter: Secondary | ICD-10-CM | POA: Diagnosis not present

## 2015-02-11 DIAGNOSIS — B9689 Other specified bacterial agents as the cause of diseases classified elsewhere: Secondary | ICD-10-CM | POA: Diagnosis not present

## 2015-02-11 DIAGNOSIS — I10 Essential (primary) hypertension: Secondary | ICD-10-CM | POA: Diagnosis not present

## 2015-02-11 DIAGNOSIS — K610 Anal abscess: Secondary | ICD-10-CM | POA: Diagnosis not present

## 2015-02-11 LAB — ANAEROBIC CULTURE

## 2015-02-11 NOTE — Telephone Encounter (Signed)
I am not sure why I was called about this pt but they are growing a Neisseria species in their joint fluid culture--it is apparently not GC  I will forward to Dr. Ninetta LightsHatcher   Pt appears to be on Rocephin 1 g daily could consider going to 2 grams IV daily

## 2015-02-11 NOTE — Telephone Encounter (Signed)
Aware He was started on 2g daily, changed to 1g by pharm I believe he has f/u with Aram Beechamynthia.

## 2015-02-11 NOTE — Telephone Encounter (Signed)
Weird they had him on 1g I would think he needs 2 g for joint infection. I asked them to send to state lab for ID and sensis it is not GC or Meningococcus

## 2015-02-12 DIAGNOSIS — K610 Anal abscess: Secondary | ICD-10-CM | POA: Diagnosis not present

## 2015-02-12 DIAGNOSIS — M00861 Arthritis due to other bacteria, right knee: Secondary | ICD-10-CM | POA: Diagnosis not present

## 2015-02-12 DIAGNOSIS — I10 Essential (primary) hypertension: Secondary | ICD-10-CM | POA: Diagnosis not present

## 2015-02-12 DIAGNOSIS — B20 Human immunodeficiency virus [HIV] disease: Secondary | ICD-10-CM | POA: Diagnosis not present

## 2015-02-12 DIAGNOSIS — B9689 Other specified bacterial agents as the cause of diseases classified elsewhere: Secondary | ICD-10-CM | POA: Diagnosis not present

## 2015-02-12 DIAGNOSIS — S83281D Other tear of lateral meniscus, current injury, right knee, subsequent encounter: Secondary | ICD-10-CM | POA: Diagnosis not present

## 2015-02-13 DIAGNOSIS — I10 Essential (primary) hypertension: Secondary | ICD-10-CM | POA: Diagnosis not present

## 2015-02-13 DIAGNOSIS — M00861 Arthritis due to other bacteria, right knee: Secondary | ICD-10-CM | POA: Diagnosis not present

## 2015-02-13 DIAGNOSIS — B20 Human immunodeficiency virus [HIV] disease: Secondary | ICD-10-CM | POA: Diagnosis not present

## 2015-02-13 DIAGNOSIS — S83281D Other tear of lateral meniscus, current injury, right knee, subsequent encounter: Secondary | ICD-10-CM | POA: Diagnosis not present

## 2015-02-13 DIAGNOSIS — B9689 Other specified bacterial agents as the cause of diseases classified elsewhere: Secondary | ICD-10-CM | POA: Diagnosis not present

## 2015-02-13 DIAGNOSIS — K610 Anal abscess: Secondary | ICD-10-CM | POA: Diagnosis not present

## 2015-02-14 ENCOUNTER — Ambulatory Visit: Payer: Medicare Other | Admitting: Internal Medicine

## 2015-02-17 ENCOUNTER — Ambulatory Visit (INDEPENDENT_AMBULATORY_CARE_PROVIDER_SITE_OTHER): Payer: Medicare Other | Admitting: Internal Medicine

## 2015-02-17 ENCOUNTER — Encounter: Payer: Self-pay | Admitting: Internal Medicine

## 2015-02-17 VITALS — BP 149/86 | HR 101 | Temp 97.7°F | Ht 73.0 in | Wt 231.8 lb

## 2015-02-17 DIAGNOSIS — B0229 Other postherpetic nervous system involvement: Secondary | ICD-10-CM | POA: Diagnosis not present

## 2015-02-17 DIAGNOSIS — I10 Essential (primary) hypertension: Secondary | ICD-10-CM

## 2015-02-17 DIAGNOSIS — K610 Anal abscess: Secondary | ICD-10-CM | POA: Diagnosis not present

## 2015-02-17 DIAGNOSIS — S83281D Other tear of lateral meniscus, current injury, right knee, subsequent encounter: Secondary | ICD-10-CM | POA: Diagnosis not present

## 2015-02-17 DIAGNOSIS — M009 Pyogenic arthritis, unspecified: Secondary | ICD-10-CM

## 2015-02-17 DIAGNOSIS — B9689 Other specified bacterial agents as the cause of diseases classified elsewhere: Secondary | ICD-10-CM | POA: Diagnosis not present

## 2015-02-17 DIAGNOSIS — B20 Human immunodeficiency virus [HIV] disease: Secondary | ICD-10-CM | POA: Diagnosis not present

## 2015-02-17 DIAGNOSIS — M00861 Arthritis due to other bacteria, right knee: Secondary | ICD-10-CM | POA: Diagnosis not present

## 2015-02-17 MED ORDER — OXYCODONE-ACETAMINOPHEN 5-325 MG PO TABS: 1.0000 | ORAL_TABLET | ORAL | Status: DC | PRN

## 2015-02-17 MED ORDER — LISINOPRIL 40 MG PO TABS: 40.0000 mg | ORAL_TABLET | Freq: Every day | ORAL | Status: DC

## 2015-02-17 MED ORDER — CARBAMIDE PEROXIDE 6.5 % OT SOLN: 5.0000 [drp] | Freq: Two times a day (BID) | OTIC | Status: AC

## 2015-02-17 MED ORDER — AMLODIPINE BESYLATE 10 MG PO TABS: 10.0000 mg | ORAL_TABLET | Freq: Every day | ORAL | Status: DC

## 2015-02-17 NOTE — Patient Instructions (Signed)
-  Make sure to go the the appointment tomorrow.  -Call Dr. Greig RightMurphy's office today to make an appointment.  -If you don't get a call from the ear doctors by the end of this week, give us a call  -We refilled your medications - let us know if you need anything else.  See you in 3 months or earlier if you need anything.  Thanks, Reubin MilanBilly Ashon Rosenberg

## 2015-02-17 NOTE — Assessment & Plan Note (Signed)
Patient has continued L ear irritation now with progressive hearing loss on the L side. Otoscopic exam today shows sclerotic appearance, possibly post-herpetic as this was in the same distribution. Patient says he gets some mild temporary relief from debrox ear drops. -Will prescribe debrox today -Referral for ENT today

## 2015-02-17 NOTE — Assessment & Plan Note (Signed)
BP Readings from Last 3 Encounters:  02/17/15 149/86  02/08/15 152/92  02/03/15 148/81    Lab Results  Component Value Date   NA 134* 02/07/2015   K 3.3* 02/07/2015   CREATININE 0.86 02/07/2015    Assessment: Blood pressure control:  near goal Progress toward BP goal:   near goal Comments: on amlodipine 10 and lisinopril 40, complaint. Slightly above goal today but in setting of acute pain  Plan: Medications:  continue current medications Educational resources provided:   Self management tools provided:   Other plans: Re-check in 3 months

## 2015-02-17 NOTE — Assessment & Plan Note (Signed)
R-sided, improving on IV ceftriaxone (last dose tomorrow) and PT. Patient is continuing to improve and does not show clinical signs today of worsening infection. -Has appt with gen surg tomorrow -Instructed patient to make appt with ortho today. Will call tomorrow and follow-up. -Refilled percocet -Continue abx til tomorrow and remove PICC tomorrow after last dose -PT

## 2015-02-17 NOTE — Progress Notes (Signed)
Patient ID: LIOR CWIKLINSKI male   DOB: Jan 27, 1966 49 y.o.   MRN: [id number]  Subjective:   HPI: Mr.Fahad RIYON MCILROY is a 49 y.o. with PMH of HIV (02/2015: CD4 470, VL <20), perirectal/perianal abscesses s/p diverting colostomy (2013), and HTN who presents to [facility] today for hospital follow-up after discharge for septic arthritis of the R knee. Since discharge, he is feeling much better. He is able to ambulate with crutches and the pain is steadily improving and well controlled with percocet. He says the swelling is decreasing as well. He is working well with PT and home health. He denies fevers, chills, malaise, drainage, increased warmth or any other issues. His only complaint today is a progressive L sided hearing loss as well as occasional itching that is relieved by peroxide over the past 2 years. He has a history of shingles in the left facial V3 distribution.   Please see problem-based charting for status of medical issues pertinent to this visit.  Review of Systems: Pertinent items noted in HPI and remainder of comprehensive ROS otherwise negative.  Objective:  Physical Exam: Filed Vitals:   02/17/15 0822  BP: 149/86  Pulse: 101  Temp: 97.7 F (36.5 C)  TempSrc: Oral  Height: 6\' 1"  (1.854 m)  Weight: 231 lb 12.8 oz (105.144 kg)  SpO2: 100%   Gen: Well-appearing, alert and oriented to person, place, and time HEENT: Oropharynx clear without erythema or exudate. TM on R normal. L TM shows cerumen with white sclerotic-appearing line extending along the posterior EAC to the TM, which appears thickened compared to the left. Neck: No cervical LAD, no thyromegaly or nodules, no JVD noted. CV: Normal rate, regular rhythm, no murmurs, rubs, or gallops Pulmonary: Normal effort, CTA bilaterally, no wheezing, rales, or rhonchi Abdominal: Soft, non-tender, non-distended, without rebound, guarding, or masses Extremities: Distal pulses 2+ in upper and lower extremities bilaterally. R knee is  swollen compared to the left, tender to palpation, no drainage noted. Pain with ROM but intact. Skin: No atypical appearing moles. No rashes  Assessment & Plan:  Please see problem-based charting for assessment and plan.  Reubin Milan, MD Resident Physician, PGY-1 Department of Internal Medicine [facility]

## 2015-02-18 DIAGNOSIS — M00861 Arthritis due to other bacteria, right knee: Secondary | ICD-10-CM | POA: Diagnosis not present

## 2015-02-18 DIAGNOSIS — B9689 Other specified bacterial agents as the cause of diseases classified elsewhere: Secondary | ICD-10-CM | POA: Diagnosis not present

## 2015-02-18 DIAGNOSIS — K610 Anal abscess: Secondary | ICD-10-CM | POA: Diagnosis not present

## 2015-02-18 DIAGNOSIS — B20 Human immunodeficiency virus [HIV] disease: Secondary | ICD-10-CM | POA: Diagnosis not present

## 2015-02-18 DIAGNOSIS — I10 Essential (primary) hypertension: Secondary | ICD-10-CM | POA: Diagnosis not present

## 2015-02-18 DIAGNOSIS — S83281D Other tear of lateral meniscus, current injury, right knee, subsequent encounter: Secondary | ICD-10-CM | POA: Diagnosis not present

## 2015-02-18 NOTE — Progress Notes (Signed)
Internal Medicine Clinic Attending  I saw and evaluated the patient.  I personally confirmed the key portions of the history and exam documented by Dr. Kennedy and I reviewed pertinent patient test results.  The assessment, diagnosis, and plan were formulated together and I agree with the documentation in the resident's note.  

## 2015-02-20 DIAGNOSIS — B9689 Other specified bacterial agents as the cause of diseases classified elsewhere: Secondary | ICD-10-CM | POA: Diagnosis not present

## 2015-02-20 DIAGNOSIS — S83281D Other tear of lateral meniscus, current injury, right knee, subsequent encounter: Secondary | ICD-10-CM | POA: Diagnosis not present

## 2015-02-20 DIAGNOSIS — B20 Human immunodeficiency virus [HIV] disease: Secondary | ICD-10-CM | POA: Diagnosis not present

## 2015-02-20 DIAGNOSIS — M00861 Arthritis due to other bacteria, right knee: Secondary | ICD-10-CM | POA: Diagnosis not present

## 2015-02-20 DIAGNOSIS — I10 Essential (primary) hypertension: Secondary | ICD-10-CM | POA: Diagnosis not present

## 2015-02-20 DIAGNOSIS — K610 Anal abscess: Secondary | ICD-10-CM | POA: Diagnosis not present

## 2015-02-21 DIAGNOSIS — S83281D Other tear of lateral meniscus, current injury, right knee, subsequent encounter: Secondary | ICD-10-CM | POA: Diagnosis not present

## 2015-02-21 DIAGNOSIS — K610 Anal abscess: Secondary | ICD-10-CM | POA: Diagnosis not present

## 2015-02-21 DIAGNOSIS — B20 Human immunodeficiency virus [HIV] disease: Secondary | ICD-10-CM | POA: Diagnosis not present

## 2015-02-21 DIAGNOSIS — B9689 Other specified bacterial agents as the cause of diseases classified elsewhere: Secondary | ICD-10-CM | POA: Diagnosis not present

## 2015-02-21 DIAGNOSIS — I10 Essential (primary) hypertension: Secondary | ICD-10-CM | POA: Diagnosis not present

## 2015-02-21 DIAGNOSIS — M00861 Arthritis due to other bacteria, right knee: Secondary | ICD-10-CM | POA: Diagnosis not present

## 2015-02-24 DIAGNOSIS — I10 Essential (primary) hypertension: Secondary | ICD-10-CM | POA: Diagnosis not present

## 2015-02-24 DIAGNOSIS — M00861 Arthritis due to other bacteria, right knee: Secondary | ICD-10-CM | POA: Diagnosis not present

## 2015-02-24 DIAGNOSIS — B9689 Other specified bacterial agents as the cause of diseases classified elsewhere: Secondary | ICD-10-CM | POA: Diagnosis not present

## 2015-02-24 DIAGNOSIS — S83241D Other tear of medial meniscus, current injury, right knee, subsequent encounter: Secondary | ICD-10-CM | POA: Diagnosis not present

## 2015-02-24 DIAGNOSIS — B20 Human immunodeficiency virus [HIV] disease: Secondary | ICD-10-CM | POA: Diagnosis not present

## 2015-02-24 DIAGNOSIS — S83281D Other tear of lateral meniscus, current injury, right knee, subsequent encounter: Secondary | ICD-10-CM | POA: Diagnosis not present

## 2015-02-24 DIAGNOSIS — K610 Anal abscess: Secondary | ICD-10-CM | POA: Diagnosis not present

## 2015-02-25 DIAGNOSIS — M00861 Arthritis due to other bacteria, right knee: Secondary | ICD-10-CM | POA: Diagnosis not present

## 2015-02-25 DIAGNOSIS — K610 Anal abscess: Secondary | ICD-10-CM | POA: Diagnosis not present

## 2015-02-25 DIAGNOSIS — B20 Human immunodeficiency virus [HIV] disease: Secondary | ICD-10-CM | POA: Diagnosis not present

## 2015-02-25 DIAGNOSIS — I10 Essential (primary) hypertension: Secondary | ICD-10-CM | POA: Diagnosis not present

## 2015-02-25 DIAGNOSIS — B9689 Other specified bacterial agents as the cause of diseases classified elsewhere: Secondary | ICD-10-CM | POA: Diagnosis not present

## 2015-02-25 DIAGNOSIS — S83281D Other tear of lateral meniscus, current injury, right knee, subsequent encounter: Secondary | ICD-10-CM | POA: Diagnosis not present

## 2015-02-25 LAB — BODY FLUID CULTURE: Special Requests: NORMAL

## 2015-02-26 ENCOUNTER — Telehealth: Payer: Self-pay | Admitting: Infectious Disease

## 2015-02-26 NOTE — Telephone Encounter (Signed)
Leon Nichols he could also be on [place] instead now that I know his HLA status that should DEF be on formulary  Does he have copay cards???

## 2015-02-26 NOTE — Telephone Encounter (Signed)
Received call from lab and patient's culture done at the [facility] is + for N gonorrhea

## 2015-03-17 ENCOUNTER — Inpatient Hospital Stay: Payer: Medicare Other | Admitting: Internal Medicine

## 2015-04-09 NOTE — Addendum Note (Signed)
Addended by: Neomia DearPOWERS, Jasminne Mealy E on: 04/09/2015 06:23 PM   Modules accepted: Orders

## 2015-04-10 ENCOUNTER — Encounter: Payer: Self-pay | Admitting: Infectious Diseases

## 2015-05-28 ENCOUNTER — Telehealth: Payer: Self-pay | Admitting: Internal Medicine

## 2015-05-28 NOTE — Telephone Encounter (Signed)
APPT. REMINDER CALL, LMTCB IF HE NEEDS TO CANCEL °

## 2015-05-29 ENCOUNTER — Encounter: Payer: Medicare Other | Admitting: Internal Medicine

## 2015-06-19 ENCOUNTER — Encounter: Payer: Self-pay | Admitting: Internal Medicine

## 2015-06-19 ENCOUNTER — Encounter: Payer: Medicare Other | Admitting: Internal Medicine

## 2015-07-24 ENCOUNTER — Encounter: Payer: Self-pay | Admitting: Internal Medicine

## 2015-09-08 ENCOUNTER — Telehealth: Payer: Self-pay | Admitting: Internal Medicine

## 2015-09-08 NOTE — Telephone Encounter (Signed)
APT. REMINDER CALL, NO ANSWER, NO VOICEMAIL °

## 2015-09-09 ENCOUNTER — Encounter: Payer: Self-pay | Admitting: Internal Medicine

## 2015-09-09 ENCOUNTER — Encounter: Payer: Medicare Other | Admitting: Internal Medicine

## 2015-09-18 ENCOUNTER — Encounter (HOSPITAL_COMMUNITY): Payer: Self-pay | Admitting: Emergency Medicine

## 2015-09-18 ENCOUNTER — Emergency Department (HOSPITAL_COMMUNITY)
Admission: EM | Admit: 2015-09-18 | Discharge: 2015-09-18 | Disposition: A | Discharge status: Home / Self Care | Payer: No Typology Code available for payment source | Attending: Emergency Medicine | Admitting: Emergency Medicine

## 2015-09-18 ENCOUNTER — Emergency Department (HOSPITAL_COMMUNITY): Payer: No Typology Code available for payment source

## 2015-09-18 DIAGNOSIS — S060X0A Concussion without loss of consciousness, initial encounter: Secondary | ICD-10-CM | POA: Diagnosis not present

## 2015-09-18 DIAGNOSIS — Y92410 Unspecified street and highway as the place of occurrence of the external cause: Secondary | ICD-10-CM | POA: Insufficient documentation

## 2015-09-18 DIAGNOSIS — Z791 Long term (current) use of non-steroidal anti-inflammatories (NSAID): Secondary | ICD-10-CM | POA: Diagnosis not present

## 2015-09-18 DIAGNOSIS — I10 Essential (primary) hypertension: Secondary | ICD-10-CM | POA: Diagnosis not present

## 2015-09-18 DIAGNOSIS — Y999 Unspecified external cause status: Secondary | ICD-10-CM | POA: Diagnosis not present

## 2015-09-18 DIAGNOSIS — Z79899 Other long term (current) drug therapy: Secondary | ICD-10-CM | POA: Insufficient documentation

## 2015-09-18 DIAGNOSIS — F17210 Nicotine dependence, cigarettes, uncomplicated: Secondary | ICD-10-CM | POA: Insufficient documentation

## 2015-09-18 DIAGNOSIS — R42 Dizziness and giddiness: Secondary | ICD-10-CM | POA: Diagnosis not present

## 2015-09-18 DIAGNOSIS — Y939 Activity, unspecified: Secondary | ICD-10-CM | POA: Insufficient documentation

## 2015-09-18 DIAGNOSIS — R51 Headache: Secondary | ICD-10-CM | POA: Diagnosis not present

## 2015-09-18 DIAGNOSIS — S199XXA Unspecified injury of neck, initial encounter: Secondary | ICD-10-CM | POA: Diagnosis not present

## 2015-09-18 DIAGNOSIS — S161XXA Strain of muscle, fascia and tendon at neck level, initial encounter: Secondary | ICD-10-CM | POA: Diagnosis not present

## 2015-09-18 DIAGNOSIS — S0990XA Unspecified injury of head, initial encounter: Secondary | ICD-10-CM | POA: Diagnosis present

## 2015-09-18 MED ORDER — HYDROCODONE-ACETAMINOPHEN 5-325 MG PO TABS
1.0000 | ORAL_TABLET | Freq: Four times a day (QID) | ORAL | Status: DC | PRN
Start: 2015-09-18 — End: 2015-10-15

## 2015-09-18 NOTE — ED Notes (Signed)
Patient ambulated to room with no distress, appeared steady on his feet.

## 2015-09-18 NOTE — ED Notes (Signed)
Patient transported to CT 
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
## 2015-09-18 NOTE — Discharge Instructions (Signed)
Concussion, Adult A concussion, or closed-head injury, is a brain injury caused by a direct blow to the head or by a quick and sudden movement (jolt) of the head or neck. Concussions are usually not life-threatening. Even so, the effects of a concussion can be serious. If you have had a concussion before, you are more likely to experience concussion-like symptoms after a direct blow to the head.  CAUSES  Direct blow to the head, such as from running into another player during a soccer game, being hit in a fight, or hitting your head on a hard surface.  A jolt of the head or neck that causes the brain to move back and forth inside the skull, such as in a car crash. SIGNS AND SYMPTOMS The signs of a concussion can be hard to notice. Early on, they may be missed by you, family members, and health care providers. You may look fine but act or feel differently. Symptoms are usually temporary, but they may last for days, weeks, or even longer. Some symptoms may appear right away while others may not show up for hours or days. Every head injury is different. Symptoms include:  Mild to moderate headaches that will not go away.  A feeling of pressure inside your head.  Having more trouble than usual:  Learning or remembering things you have heard.  Answering questions.  Paying attention or concentrating.  Organizing daily tasks.  Making decisions and solving problems.  Slowness in thinking, acting or reacting, speaking, or reading.  Getting lost or being easily confused.  Feeling tired all the time or lacking energy (fatigued).  Feeling drowsy.  Sleep disturbances.  Sleeping more than usual.  Sleeping less than usual.  Trouble falling asleep.  Trouble sleeping (insomnia).  Loss of balance or feeling lightheaded or dizzy.  Nausea or vomiting.  Numbness or tingling.  Increased sensitivity to:  Sounds.  Lights.  Distractions.  Vision problems or eyes that tire  easily.  Diminished sense of taste or smell.  Ringing in the ears.  Mood changes such as feeling sad or anxious.  Becoming easily irritated or angry for little or no reason.  Lack of motivation.  Seeing or hearing things other people do not see or hear (hallucinations). DIAGNOSIS Your health care provider can usually diagnose a concussion based on a description of your injury and symptoms. He or she will ask whether you passed out (lost consciousness) and whether you are having trouble remembering events that happened right before and during your injury. Your evaluation might include:  A brain scan to look for signs of injury to the brain. Even if the test shows no injury, you may still have a concussion.  Blood tests to be sure other problems are not present. TREATMENT  Concussions are usually treated in an emergency department, in urgent care, or at a clinic. You may need to stay in the hospital overnight for further treatment.  Tell your health care provider if you are taking any medicines, including prescription medicines, over-the-counter medicines, and natural remedies. Some medicines, such as blood thinners (anticoagulants) and aspirin, may increase the chance of complications. Also tell your health care provider whether you have had alcohol or are taking illegal drugs. This information may affect treatment.  Your health care provider will send you home with important instructions to follow.  How fast you will recover from a concussion depends on many factors. These factors include how severe your concussion is, what part of your brain was injured,  your age, and how healthy you were before the concussion.  Most people with mild injuries recover fully. Recovery can take time. In general, recovery is slower in older persons. Also, persons who have had a concussion in the past or have other medical problems may find that it takes longer to recover from their current injury. HOME  CARE INSTRUCTIONS General Instructions  Carefully follow the directions your health care provider gave you.  Only take over-the-counter or prescription medicines for pain, discomfort, or fever as directed by your health care provider.  Take only those medicines that your health care provider has approved.  Do not drink alcohol until your health care provider says you are well enough to do so. Alcohol and certain other drugs may slow your recovery and can put you at risk of further injury.  If it is harder than usual to remember things, write them down.  If you are easily distracted, try to do one thing at a time. For example, do not try to watch TV while fixing dinner.  Talk with family members or close friends when making important decisions.  Keep all follow-up appointments. Repeated evaluation of your symptoms is recommended for your recovery.  Watch your symptoms and tell others to do the same. Complications sometimes occur after a concussion. Older adults with a brain injury may have a higher risk of serious complications, such as a blood clot on the brain.  Tell your teachers, school nurse, school counselor, coach, athletic trainer, or work Freight forwarder about your injury, symptoms, and restrictions. Tell them about what you can or cannot do. They should watch for:  Increased problems with attention or concentration.  Increased difficulty remembering or learning new information.  Increased time needed to complete tasks or assignments.  Increased irritability or decreased ability to cope with stress.  Increased symptoms.  Rest. Rest helps the brain to heal. Make sure you:  Get plenty of sleep at night. Avoid staying up late at night.  Keep the same bedtime hours on weekends and weekdays.  Rest during the day. Take daytime naps or rest breaks when you feel tired.  Limit activities that require a lot of thought or concentration. These include:  Doing homework or job-related  work.  Watching TV.  Working on the computer.  Avoid any situation where there is potential for another head injury (football, hockey, soccer, basketball, martial arts, downhill snow sports and horseback riding). Your condition will get worse every time you experience a concussion. You should avoid these activities until you are evaluated by the appropriate follow-up health care providers. Returning To Your Regular Activities You will need to return to your normal activities slowly, not all at once. You must give your body and brain enough time for recovery.  Do not return to sports or other athletic activities until your health care provider tells you it is safe to do so.  Ask your health care provider when you can drive, ride a bicycle, or operate heavy machinery. Your ability to react may be slower after a brain injury. Never do these activities if you are dizzy.  Ask your health care provider about when you can return to work or school. Preventing Another Concussion It is very important to avoid another brain injury, especially before you have recovered. In rare cases, another injury can lead to permanent brain damage, brain swelling, or death. The risk of this is greatest during the first 7-10 days after a head injury. Avoid injuries by:  Wearing a  seat belt when riding in a car.  Drinking alcohol only in moderation.  Wearing a helmet when biking, skiing, skateboarding, skating, or doing similar activities.  Avoiding activities that could lead to a second concussion, such as contact or recreational sports, until your health care provider says it is okay.  Taking safety measures in your home.  Remove clutter and tripping hazards from floors and stairways.  Use grab bars in bathrooms and handrails by stairs.  Place non-slip mats on floors and in bathtubs.  Improve lighting in dim areas. SEEK MEDICAL CARE IF:  You have increased problems paying attention or  concentrating.  You have increased difficulty remembering or learning new information.  You need more time to complete tasks or assignments than before.  You have increased irritability or decreased ability to cope with stress.  You have more symptoms than before. Seek medical care if you have any of the following symptoms for more than 2 weeks after your injury:  Lasting (chronic) headaches.  Dizziness or balance problems.  Nausea.  Vision problems.  Increased sensitivity to noise or light.  Depression or mood swings.  Anxiety or irritability.  Memory problems.  Difficulty concentrating or paying attention.  Sleep problems.  Feeling tired all the time. SEEK IMMEDIATE MEDICAL CARE IF:  You have severe or worsening headaches. These may be a sign of a blood clot in the brain.  You have weakness (even if only in one hand, leg, or part of the face).  You have numbness.  You have decreased coordination.  You vomit repeatedly.  You have increased sleepiness.  One pupil is larger than the other.  You have convulsions.  You have slurred speech.  You have increased confusion. This may be a sign of a blood clot in the brain.  You have increased restlessness, agitation, or irritability.  You are unable to recognize people or places.  You have neck pain.  It is difficult to wake you up.  You have unusual behavior changes.  You lose consciousness. MAKE SURE YOU:  Understand these instructions.  Will watch your condition.  Will get help right away if you are not doing well or get worse.   This information is not intended to replace advice given to you by your health care provider. Make sure you discuss any questions you have with your health care provider.   Document Released: 06/12/2003 Document Revised: 04/12/2014 Document Reviewed: 10/12/2012 Elsevier Interactive Patient Education 2016 [employer]  Cervical Strain and Sprain With Rehab Cervical  strain and sprain are injuries that commonly occur with "whiplash" injuries. Whiplash occurs when the neck is forcefully whipped backward or forward, such as during a motor vehicle accident or during contact sports. The muscles, ligaments, tendons, discs, and nerves of the neck are susceptible to injury when this occurs. RISK FACTORS Risk of having a whiplash injury increases if:  Osteoarthritis of the spine.  Situations that make head or neck accidents or trauma more likely.  High-risk sports (football, rugby, wrestling, hockey, auto racing, gymnastics, diving, contact karate, or boxing).  Poor strength and flexibility of the neck.  Previous neck injury.  Poor tackling technique.  Improperly fitted or padded equipment. SYMPTOMS   Pain or stiffness in the front or back of neck or both.  Symptoms may present immediately or up to 24 hours after injury.  Dizziness, headache, nausea, and vomiting.  Muscle spasm with soreness and stiffness in the neck.  Tenderness and swelling at the injury site. PREVENTION  Learn  and use proper technique (avoid tackling with the head, spearing, and head-butting; use proper falling techniques to avoid landing on the head).  Warm up and stretch properly before activity.  Maintain physical fitness:  Strength, flexibility, and endurance.  Cardiovascular fitness.  Wear properly fitted and padded protective equipment, such as padded soft collars, for participation in contact sports. PROGNOSIS  Recovery from cervical strain and sprain injuries is dependent on the extent of the injury. These injuries are usually curable in 1 week to 3 months with appropriate treatment.  RELATED COMPLICATIONS   Temporary numbness and weakness may occur if the nerve roots are damaged, and this may persist until the nerve has completely healed.  Chronic pain due to frequent recurrence of symptoms.  Prolonged healing, especially if activity is resumed too soon  (before complete recovery). TREATMENT  Treatment initially involves the use of ice and medication to help reduce pain and inflammation. It is also important to perform strengthening and stretching exercises and modify activities that worsen symptoms so the injury does not get worse. These exercises may be performed at home or with a therapist. For patients who experience severe symptoms, a soft, padded collar may be recommended to be worn around the neck.  Improving your posture may help reduce symptoms. Posture improvement includes pulling your chin and abdomen in while sitting or standing. If you are sitting, sit in a firm chair with your buttocks against the back of the chair. While sleeping, try replacing your pillow with a small towel rolled to 2 inches in diameter, or use a cervical pillow or soft cervical collar. Poor sleeping positions delay healing.  For patients with nerve root damage, which causes numbness or weakness, the use of a cervical traction apparatus may be recommended. Surgery is rarely necessary for these injuries. However, cervical strain and sprains that are present at birth (congenital) may require surgery. MEDICATION   If pain medication is necessary, nonsteroidal anti-inflammatory medications, such as aspirin and ibuprofen, or other minor pain relievers, such as acetaminophen, are often recommended.  Do not take pain medication for 7 days before surgery.  Prescription pain relievers may be given if deemed necessary by your caregiver. Use only as directed and only as much as you need. HEAT AND COLD:   Cold treatment (icing) relieves pain and reduces inflammation. Cold treatment should be applied for 10 to 15 minutes every 2 to 3 hours for inflammation and pain and immediately after any activity that aggravates your symptoms. Use ice packs or an ice massage.  Heat treatment may be used prior to performing the stretching and strengthening activities prescribed by your  caregiver, physical therapist, or athletic trainer. Use a heat pack or a warm soak. SEEK MEDICAL CARE IF:   Symptoms get worse or do not improve in 2 weeks despite treatment.  New, unexplained symptoms develop (drugs used in treatment may produce side effects). EXERCISES RANGE OF MOTION (ROM) AND STRETCHING EXERCISES - Cervical Strain and Sprain These exercises may help you when beginning to rehabilitate your injury. In order to successfully resolve your symptoms, you must improve your posture. These exercises are designed to help reduce the forward-head and rounded-shoulder posture which contributes to this condition. Your symptoms may resolve with or without further involvement from your physician, physical therapist or athletic trainer. While completing these exercises, remember:   Restoring tissue flexibility helps normal motion to return to the joints. This allows healthier, less painful movement and activity.  An effective stretch should be held for  at least 20 seconds, although you may need to begin with shorter hold times for comfort.  A stretch should never be painful. You should only feel a gentle lengthening or release in the stretched tissue. STRETCH- Axial Extensors  Lie on your back on the floor. You may bend your knees for comfort. Place a rolled-up hand towel or dish towel, about 2 inches in diameter, under the part of your head that makes contact with the floor.  Gently tuck your chin, as if trying to make a "double chin," until you feel a gentle stretch at the base of your head.  Hold __________ seconds. Repeat __________ times. Complete this exercise __________ times per day.  STRETCH - Axial Extension   Stand or sit on a firm surface. Assume a good posture: chest up, shoulders drawn back, abdominal muscles slightly tense, knees unlocked (if standing) and feet hip width apart.  Slowly retract your chin so your head slides back and your chin slightly lowers. Continue to  look straight ahead.  You should feel a gentle stretch in the back of your head. Be certain not to feel an aggressive stretch since this can cause headaches later.  Hold for __________ seconds. Repeat __________ times. Complete this exercise __________ times per day. STRETCH - Cervical Side Bend   Stand or sit on a firm surface. Assume a good posture: chest up, shoulders drawn back, abdominal muscles slightly tense, knees unlocked (if standing) and feet hip width apart.  Without letting your nose or shoulders move, slowly tip your right / left ear to your shoulder until your feel a gentle stretch in the muscles on the opposite side of your neck.  Hold __________ seconds. Repeat __________ times. Complete this exercise __________ times per day. STRETCH - Cervical Rotators   Stand or sit on a firm surface. Assume a good posture: chest up, shoulders drawn back, abdominal muscles slightly tense, knees unlocked (if standing) and feet hip width apart.  Keeping your eyes level with the ground, slowly turn your head until you feel a gentle stretch along the back and opposite side of your neck.  Hold __________ seconds. Repeat __________ times. Complete this exercise __________ times per day. RANGE OF MOTION - Neck Circles   Stand or sit on a firm surface. Assume a good posture: chest up, shoulders drawn back, abdominal muscles slightly tense, knees unlocked (if standing) and feet hip width apart.  Gently roll your head down and around from the back of one shoulder to the back of the other. The motion should never be forced or painful.  Repeat the motion 10-20 times, or until you feel the neck muscles relax and loosen. Repeat __________ times. Complete the exercise __________ times per day. STRENGTHENING EXERCISES - Cervical Strain and Sprain These exercises may help you when beginning to rehabilitate your injury. They may resolve your symptoms with or without further involvement from your  physician, physical therapist, or athletic trainer. While completing these exercises, remember:   Muscles can gain both the endurance and the strength needed for everyday activities through controlled exercises.  Complete these exercises as instructed by your physician, physical therapist, or athletic trainer. Progress the resistance and repetitions only as guided.  You may experience muscle soreness or fatigue, but the pain or discomfort you are trying to eliminate should never worsen during these exercises. If this pain does worsen, stop and make certain you are following the directions exactly. If the pain is still present after adjustments, discontinue the exercise  until you can discuss the trouble with your clinician. STRENGTH - Cervical Flexors, Isometric  Face a wall, standing about 6 inches away. Place a small pillow, a ball about 6-8 inches in diameter, or a folded towel between your forehead and the wall.  Slightly tuck your chin and gently push your forehead into the soft object. Push only with mild to moderate intensity, building up tension gradually. Keep your jaw and forehead relaxed.  Hold 10 to 20 seconds. Keep your breathing relaxed.  Release the tension slowly. Relax your neck muscles completely before you start the next repetition. Repeat __________ times. Complete this exercise __________ times per day. STRENGTH- Cervical Lateral Flexors, Isometric   Stand about 6 inches away from a wall. Place a small pillow, a ball about 6-8 inches in diameter, or a folded towel between the side of your head and the wall.  Slightly tuck your chin and gently tilt your head into the soft object. Push only with mild to moderate intensity, building up tension gradually. Keep your jaw and forehead relaxed.  Hold 10 to 20 seconds. Keep your breathing relaxed.  Release the tension slowly. Relax your neck muscles completely before you start the next repetition. Repeat __________ times.  Complete this exercise __________ times per day. STRENGTH - Cervical Extensors, Isometric   Stand about 6 inches away from a wall. Place a small pillow, a ball about 6-8 inches in diameter, or a folded towel between the back of your head and the wall.  Slightly tuck your chin and gently tilt your head back into the soft object. Push only with mild to moderate intensity, building up tension gradually. Keep your jaw and forehead relaxed.  Hold 10 to 20 seconds. Keep your breathing relaxed.  Release the tension slowly. Relax your neck muscles completely before you start the next repetition. Repeat __________ times. Complete this exercise __________ times per day. POSTURE AND BODY MECHANICS CONSIDERATIONS - Cervical Strain and Sprain Keeping correct posture when sitting, standing or completing your activities will reduce the stress put on different body tissues, allowing injured tissues a chance to heal and limiting painful experiences. The following are general guidelines for improved posture. Your physician or physical therapist will provide you with any instructions specific to your needs. While reading these guidelines, remember:  The exercises prescribed by your provider will help you have the flexibility and strength to maintain correct postures.  The correct posture provides the optimal environment for your joints to work. All of your joints have less wear and tear when properly supported by a spine with good posture. This means you will experience a healthier, less painful body.  Correct posture must be practiced with all of your activities, especially prolonged sitting and standing. Correct posture is as important when doing repetitive low-stress activities (typing) as it is when doing a single heavy-load activity (lifting). PROLONGED STANDING WHILE SLIGHTLY LEANING FORWARD When completing a task that requires you to lean forward while standing in one place for a long time, place either foot  up on a stationary 2- to 4-inch high object to help maintain the best posture. When both feet are on the ground, the low back tends to lose its slight inward curve. If this curve flattens (or becomes too large), then the back and your other joints will experience too much stress, fatigue more quickly, and can cause pain.  RESTING POSITIONS Consider which positions are most painful for you when choosing a resting position. If you have pain with flexion-based  activities (sitting, bending, stooping, squatting), choose a position that allows you to rest in a less flexed posture. You would want to avoid curling into a fetal position on your side. If your pain worsens with extension-based activities (prolonged standing, working overhead), avoid resting in an extended position such as sleeping on your stomach. Most people will find more comfort when they rest with their spine in a more neutral position, neither too rounded nor too arched. Lying on a non-sagging bed on your side with a pillow between your knees, or on your back with a pillow under your knees will often provide some relief. Keep in mind, being in any one position for a prolonged period of time, no matter how correct your posture, can still lead to stiffness. WALKING Walk with an upright posture. Your ears, shoulders, and hips should all line up. OFFICE WORK When working at a desk, create an environment that supports good, upright posture. Without extra support, muscles fatigue and lead to excessive strain on joints and other tissues. CHAIR:  A chair should be able to slide under your desk when your back makes contact with the back of the chair. This allows you to work closely.  The chair's height should allow your eyes to be level with the upper part of your monitor and your hands to be slightly lower than your elbows.  Body position:  Your feet should make contact with the floor. If this is not possible, use a foot rest.  Keep your ears  over your shoulders. This will reduce stress on your neck and low back.   This information is not intended to replace advice given to you by your health care provider. Make sure you discuss any questions you have with your health care provider.   Document Released: 03/22/2005 Document Revised: 04/12/2014 Document Reviewed: 07/04/2008 Else[employer] Interactive Patient Education [employer]
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
## 2015-10-01 ENCOUNTER — Ambulatory Visit: Payer: Medicare Other | Admitting: Internal Medicine

## 2015-10-06 ENCOUNTER — Encounter: Payer: Self-pay | Admitting: Internal Medicine

## 2015-10-06 ENCOUNTER — Ambulatory Visit: Payer: Medicare Other

## 2015-10-15 ENCOUNTER — Ambulatory Visit (INDEPENDENT_AMBULATORY_CARE_PROVIDER_SITE_OTHER): Payer: Medicare Other | Admitting: Internal Medicine

## 2015-10-15 VITALS — BP 149/88 | HR 66 | Temp 98.2°F | Ht 73.0 in | Wt 241.0 lb

## 2015-10-15 DIAGNOSIS — H7292 Unspecified perforation of tympanic membrane, left ear: Secondary | ICD-10-CM | POA: Insufficient documentation

## 2015-10-15 DIAGNOSIS — Z933 Colostomy status: Secondary | ICD-10-CM | POA: Diagnosis not present

## 2015-10-15 DIAGNOSIS — R51 Headache: Secondary | ICD-10-CM

## 2015-10-15 DIAGNOSIS — H938X2 Other specified disorders of left ear: Secondary | ICD-10-CM

## 2015-10-15 DIAGNOSIS — R519 Headache, unspecified: Secondary | ICD-10-CM | POA: Insufficient documentation

## 2015-10-15 DIAGNOSIS — K9419 Other complications of enterostomy: Secondary | ICD-10-CM

## 2015-10-15 DIAGNOSIS — F17210 Nicotine dependence, cigarettes, uncomplicated: Secondary | ICD-10-CM

## 2015-10-15 NOTE — Patient Instructions (Signed)
Thank you for your visit today  Please follow up with the ENT doctor for your ear fullness Please decrease your aleve use for your headache  If your headache worsens or does not improve in a month, then come back to the clinic and we can refer you to neurology
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
## 2015-10-15 NOTE — Assessment & Plan Note (Signed)
Signed paperwork for ostomy supplies

## 2015-10-15 NOTE — Addendum Note (Signed)
Addended by: Carlynn PurlHOFFMAN, Clarnce Homan C on: 10/15/2015 05:17 PM   Modules accepted: Level of Service

## 2015-10-15 NOTE — Progress Notes (Signed)
Patient ID: Leon Nichols, male   DOB: 25-Apr-1965, 50 y.o.   MRN: [id number]    CC: headache and refill for colostomy supplies and left ear fullness HPI: [name] is a 50 y.o. man with PMH noted below here for headache, refill for colostomy supplies and left ear fullness   Please see Problem List/A&P for the status of the patient's chronic medical problems   Past Medical History  Diagnosis Date  . Hypertension   . Diverticulosis   . Hemorrhoids   . Perirectal abscess   . Colitis   . History of shingles   . HIV (human immunodeficiency virus infection) (HCC) 03/24/2012    Review of Systems:  Constitutional: Negative for fever, chills, weight loss and malaise/fatigue.  HEENT: + headache, occasional dizziness, left ear hearing loss Respiratory: Negative for cough, shortness of breath and wheezing.  Gastrointestinal: Negative for nausea, vomiting, abdominal pain Neurological: Negative for weakness Skin: no rash  Physical Exam: Filed Vitals:   10/15/15 1437  BP: 149/88  Pulse: 66  Temp: 98.2 F (36.8 C)  TempSrc: Oral  Height: 6\' 1"  (1.854 m)  Weight: 241 lb (109.317 kg)  SpO2: 100%    General: A&O, in NAD HEENT: EOMI, sclera white, conjunctiva pink , PERRLA     Left ear: tympanic membrane bulging. No vesicles.     Right ear- normal tympanic membrane. Some cerumen  CV: RRR, normal s1, s2, no m/r/g Resp: equal and symmetric breath sounds, no wheezing or crackles  Abdomen: soft, nontender, nondistended, +BS Skin: warm, dry, intact, no open lesions or rashes noted Extremities: pulses intact b/l, no edema, clubbing or cyanosis, no calf tenderness Neurologic: no focal neurological deficits   Assessment & Plan:   See encounters tab for problem based medical decision making. Patient discussed with Dr. Cleda DaubE. Hoffman
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
## 2016-01-12 NOTE — Addendum Note (Signed)
Addended by: Neomia DearPOWERS, Gar Glance E on: 01/12/2016 09:43 PM   Modules accepted: Orders

## 2016-02-27 ENCOUNTER — Other Ambulatory Visit: Payer: Self-pay | Admitting: Internal Medicine

## 2016-03-05 NOTE — Telephone Encounter (Signed)
Please send the prescription to infectious disease. We normally do not refill HIV medications- is there a specific reason he would like us to refill?

## 2016-03-24 ENCOUNTER — Other Ambulatory Visit: Payer: Self-pay | Admitting: Internal Medicine

## 2016-04-15 ENCOUNTER — Other Ambulatory Visit: Payer: Medicare Other

## 2016-04-29 ENCOUNTER — Ambulatory Visit: Payer: Medicare Other | Admitting: Internal Medicine

## 2016-07-21 ENCOUNTER — Encounter (INDEPENDENT_AMBULATORY_CARE_PROVIDER_SITE_OTHER): Payer: Self-pay

## 2016-07-21 ENCOUNTER — Telehealth: Payer: Self-pay | Admitting: *Deleted

## 2016-07-21 ENCOUNTER — Ambulatory Visit (INDEPENDENT_AMBULATORY_CARE_PROVIDER_SITE_OTHER): Payer: Medicare Other | Admitting: Internal Medicine

## 2016-07-21 ENCOUNTER — Encounter: Payer: Self-pay | Admitting: Internal Medicine

## 2016-07-21 VITALS — BP 196/120 | HR 87 | Temp 98.1°F | Wt 243.0 lb

## 2016-07-21 DIAGNOSIS — F17210 Nicotine dependence, cigarettes, uncomplicated: Secondary | ICD-10-CM

## 2016-07-21 DIAGNOSIS — I10 Essential (primary) hypertension: Secondary | ICD-10-CM

## 2016-07-21 DIAGNOSIS — B20 Human immunodeficiency virus [HIV] disease: Secondary | ICD-10-CM

## 2016-07-21 DIAGNOSIS — H7292 Unspecified perforation of tympanic membrane, left ear: Secondary | ICD-10-CM

## 2016-07-21 DIAGNOSIS — Z21 Asymptomatic human immunodeficiency virus [HIV] infection status: Secondary | ICD-10-CM

## 2016-07-21 MED ORDER — AMLODIPINE BESYLATE 10 MG PO TABS: 10.0000 mg | ORAL_TABLET | Freq: Every day | ORAL | 11 refills | Status: DC

## 2016-07-21 MED ORDER — ABACAVIR-DOLUTEGRAVIR-LAMIVUD 600-50-300 MG PO TABS: 1.0000 | ORAL_TABLET | Freq: Every day | ORAL | 5 refills | Status: DC

## 2016-07-21 MED ORDER — LISINOPRIL 40 MG PO TABS: 40.0000 mg | ORAL_TABLET | Freq: Every day | ORAL | 11 refills | Status: DC

## 2016-07-21 NOTE — Assessment & Plan Note (Signed)
Patient has been without his HIV medications for over a month. Per review of his records, appears he has no showed several appointments. His last CD4 count was 470 and viral load undetectable in November 2016. Will prescribe him Triumeq and refer him back to infectious disease (Dr. Drue Second).

## 2016-07-21 NOTE — Progress Notes (Signed)
   CC: Hypertension follow-up  HPI:  Leon Nichols is a 51 y.o. man with past medical history as noted below who presents today for follow-up of his hypertension.  HTN: BP is significantly elevated at 205/119. Repeat blood pressure was 196/120. Patient reports he has been out of his medications for 2-3 weeks. He was taking amlodipine 10 mg daily and lisinopril 40 mg daily. He denies any chest pain, changes in vision, weakness, changes in speech, or numbness/tingling.   Decreased hearing in the left ear: Patient reports decreased hearing in his left ear that has been ongoing for a few years. He reports he feels the ear is "stopped up" and that he feels better when he puts water in it. He reports he can hear better when water is in his ear. He also reports itching and ringing. He reports a history of shingles on his left face involving the left ear. He reports he saw ENT for this issue and was given eardrops. He has not followed up with ENT in a few years. He denies any discharge or pain in the ear.  HIV: Patient reports he has been out of his HIV medications for over a month. He states he tried calling over to the infectious disease office and was told that he would receive a call back to schedule an appointment, but this has not occurred. He was on Triumeq 600-50-300 mg daily. He denies any abdominal pain, shortness of breath, cough, fever, diarrhea, or rash.  Past Medical History:  Diagnosis Date  . Colitis   . Diverticulosis   . Hemorrhoids   . History of shingles   . HIV (human immunodeficiency virus infection) (HCC) 03/24/2012  . Hypertension   . Perirectal abscess     Review of Systems:   General: Denies night sweats, changes in weight, changes in appetite HEENT: Denies headaches, rhinorrhea, sore throat CV: Denies palpitations, orthopnea Pulm: Denies wheezing GI: Denies constipation, melena, hematochezia GU: Denies dysuria, hematuria, frequency Msk: Denies muscle cramps,  joint pains Neuro: See HPI Skin: Denies bruising Psych: Denies depression, anxiety, hallucinations  Physical Exam:  Vitals:   07/21/16 0928 07/21/16 0935  BP: (!) 205/119 (!) 196/120  Pulse: 87 87  Temp: 98.1 F (36.7 C)   TempSrc: Oral   SpO2: 100%   Weight: 243 lb (110.2 kg)    Repeat BP 196/120  General: Well-nourished man in NAD HEENT: EOMI, PERRL, visual fields intact. Pharynx non-erythematous, no thrush. Right ear canal and tympanic membrane appear normal. Left tympanic membrane is perforated with no erythema or drainage present. Left ear canal appears normal.  CV: RRR, no m/g/r Pulm: CTA bilaterally, breaths non-labored Abd: BS+, soft, non-tender Ext: no peripheral edema, full ROM Neuro: alert and oriented x 3. Tongue midline. Strength 5/5 in upper and lower extremities bilaterally. Sensation symmetric and intact.   Assessment & Plan:   See Encounters Tab for problem based charting.  Patient discussed with Dr. Rogelia Boga

## 2016-07-21 NOTE — Patient Instructions (Addendum)
General Instructions: - Restart Amlodipine and Lisinopril for your blood pressure - Referrals made to Infectious Disease (Dr. Drue Second) and ENT (Dr. Jearld Fenton) - They will likely due audiology testing for your decreased hearing - Avoid getting water in your ear - Follow up with PCP Dr. Johnny Bridge in 1 month- has appointments on 5/21  Please bring your medicines with you each time you come to clinic.  Medicines may include prescription medications, over-the-counter medications, herbal remedies, eye drops, vitamins, or other pills.   Progress Toward Treatment Goals:  Treatment Goal 01/02/2014  Blood pressure at goal    Self Care Goals & Plans:  Self Care Goal 02/17/2015  Manage my medications take my medicines as prescribed; bring my medications to every visit; refill my medications on time  Monitor my health -  Eat healthy foods drink diet soda or water instead of juice or soda; eat more vegetables; eat baked foods instead of fried foods; eat foods that are low in salt  Be physically active (No Data)  Stop smoking set a quit date and stop smoking    No flowsheet data found.   Care Management & Community Referrals:  Referral 04/13/2012  Referrals made for care management support none needed

## 2016-07-21 NOTE — Assessment & Plan Note (Signed)
Patient was noted to have a perforated left eardrum on exam. Per prior ENT evaluation in 2014 this is a chronic issue as this was present at that time. Patient had been advised to avoid getting water in his ear when he last saw ENT. Since patient is now complaining of decreased hearing on the left side and ringing he would benefit from following up with ENT and audiology studies. Will place a referral to ENT (he last saw Dr. Jearld Fenton).

## 2016-07-21 NOTE — Telephone Encounter (Signed)
PATIENT WAS CONTACTED WITH THIS APPOINTMENT. PATIENT WAS ALSO INSTRUCTED THAT IF HE DO NOT KEEP THIS APPOINTMENT THAT HE WILL BE ABLE TO GET ANY APPOINTMENT WITH [facility]. PER OFFICE, PATIENT HAS CANCELLED X 5. PATIENT UNDERSTOOD.  APPOINTMENT ALSO MAILED TO PATIIENT.

## 2016-07-21 NOTE — Progress Notes (Signed)
Internal Medicine Clinic Attending  Case discussed with Dr. Rivet at the time of the visit.  We reviewed the resident's history and exam and pertinent patient test results.  I agree with the assessment, diagnosis, and plan of care documented in the resident's note.  

## 2016-07-21 NOTE — Assessment & Plan Note (Signed)
BP is significantly elevated. Patient has been off of his antihypertensives for several weeks. He does not have any warning symptoms or signs to suggest a hypertensive emergency. Will have him restart amlodipine 10 mg daily and lisinopril 40 mg daily. Will obtain a Bmet today. He will follow-up with his PCP in a few weeks for blood pressure recheck.

## 2016-07-22 LAB — BMP8+ANION GAP
Anion Gap: 17 mmol/L (ref 10.0–18.0)
BUN / CREAT RATIO: 12 (ref 9–20)
BUN: 10 mg/dL (ref 6–24)
CHLORIDE: 103 mmol/L (ref 96–106)
CO2: 21 mmol/L (ref 18–29)
Calcium: 9.2 mg/dL (ref 8.7–10.2)
Creatinine, Ser: 0.86 mg/dL (ref 0.76–1.27)
GFR calc non Af Amer: 103 mL/min/{1.73_m2} (ref >59)
GFR, EST AFRICAN AMERICAN: 119 mL/min/{1.73_m2} (ref >59)
GLUCOSE: 115 mg/dL — AB (ref 65–99)
POTASSIUM: 3.9 mmol/L (ref 3.5–5.2)
Sodium: 141 mmol/L (ref 134–144)

## 2016-08-05 ENCOUNTER — Ambulatory Visit: Payer: Medicare Other | Admitting: Internal Medicine

## 2016-08-11 NOTE — Addendum Note (Signed)
Addended by: Neomia DearPOWERS, Perkins Molina E on: 08/11/2016 06:26 PM   Modules accepted: Orders

## 2016-08-23 ENCOUNTER — Encounter: Payer: Self-pay | Admitting: Internal Medicine

## 2016-08-23 ENCOUNTER — Encounter: Payer: Medicare Other | Admitting: Internal Medicine

## 2016-08-27 ENCOUNTER — Encounter: Payer: Self-pay | Admitting: Internal Medicine

## 2016-10-04 ENCOUNTER — Encounter: Payer: Medicare Other | Admitting: Internal Medicine

## 2016-10-18 ENCOUNTER — Ambulatory Visit: Payer: Medicare Other

## 2016-10-25 ENCOUNTER — Ambulatory Visit (INDEPENDENT_AMBULATORY_CARE_PROVIDER_SITE_OTHER): Payer: Medicare Other | Admitting: Internal Medicine

## 2016-10-25 DIAGNOSIS — M545 Low back pain: Secondary | ICD-10-CM

## 2016-10-25 DIAGNOSIS — Z8249 Family history of ischemic heart disease and other diseases of the circulatory system: Secondary | ICD-10-CM | POA: Diagnosis not present

## 2016-10-25 DIAGNOSIS — F17210 Nicotine dependence, cigarettes, uncomplicated: Secondary | ICD-10-CM

## 2016-10-25 DIAGNOSIS — M5441 Lumbago with sciatica, right side: Secondary | ICD-10-CM

## 2016-10-25 DIAGNOSIS — I10 Essential (primary) hypertension: Secondary | ICD-10-CM | POA: Diagnosis present

## 2016-10-25 DIAGNOSIS — M5442 Lumbago with sciatica, left side: Secondary | ICD-10-CM

## 2016-10-25 MED ORDER — AMLODIPINE BESYLATE 10 MG PO TABS: 10.0000 mg | ORAL_TABLET | Freq: Every day | ORAL | 11 refills | Status: DC

## 2016-10-25 MED ORDER — CYCLOBENZAPRINE HCL 5 MG PO TABS: 5.0000 mg | ORAL_TABLET | Freq: Three times a day (TID) | ORAL | 0 refills | Status: DC | PRN

## 2016-10-25 MED ORDER — LISINOPRIL 40 MG PO TABS: 40.0000 mg | ORAL_TABLET | Freq: Every day | ORAL | 11 refills | Status: DC

## 2016-10-25 MED ORDER — IBUPROFEN 800 MG PO TABS: 800.0000 mg | ORAL_TABLET | Freq: Three times a day (TID) | ORAL | 0 refills | Status: DC | PRN

## 2016-10-25 NOTE — Progress Notes (Signed)
   CC: Follow up on essential hypertension  HPI:  [name] is a 51 y.o. with history noted below that presents to the acute care clinic for follow-up on essential hypertension. Please see problem based charting for the status of patient's medical conditions  Past Medical History:  Diagnosis Date  . Colitis   . Diverticulosis   . Hemorrhoids   . History of shingles   . HIV (human immunodeficiency virus infection) (HCC) 03/24/2012  . Hypertension   . Perirectal abscess     Review of Systems:  Review of Systems  Eyes: Negative for blurred vision.  Respiratory: Negative for sputum production.   Cardiovascular: Negative for chest pain.  Musculoskeletal: Positive for back pain.  Neurological: Negative for dizziness and weakness.    Physical Exam:  Vitals:   10/25/16 1347  BP: (!) 165/113  Pulse: 75  Temp: 98 F (36.7 C)  TempSrc: Oral  SpO2: 99%  Weight: 236 lb 8 oz (107.3 kg)   Physical Exam  Constitutional: He is well-developed, well-nourished, and in no distress.  Cardiovascular: Normal rate, regular rhythm and normal heart sounds.  Exam reveals no gallop and no friction rub.   No murmur heard. Pulmonary/Chest: Effort normal and breath sounds normal. No respiratory distress. He has no wheezes. He has no rales.  Musculoskeletal:  Paraspinal musculature tightness in thoracic and lumbar region  Neurological: Gait normal.  5/5 motor strength intact bilaterally in upper and lower extremities      Assessment & Plan:   See encounters tab for problem based medical decision making.   Patient discussed with Dr. Criselda PeachesMullen

## 2016-10-25 NOTE — Patient Instructions (Addendum)
Ms. Leon Nichols,  It was a pleasure meeting you today. Please start taking her lisinopril and amlodipine and follow up in the clinic in 2weeks. For your back pain please start taking ibuprofen and Flexeril and use heating pads.

## 2016-10-28 DIAGNOSIS — M549 Dorsalgia, unspecified: Secondary | ICD-10-CM | POA: Insufficient documentation

## 2016-10-28 HISTORY — DX: Dorsalgia, unspecified: M54.9

## 2016-10-28 NOTE — Assessment & Plan Note (Signed)
Assessment:  Acute back pain Patient states that Last Friday he picked up something heavy and developed pain in his lower back the following day.  Patient states the pain radiates down the back of his legs to his knees.  He states that the pain has improved slightly over the week.  He denies weakness, numbness or tingling.  He has tried taking Aleve 2-3 pills total and stopped because it wasn't working.  I recommended doing a short course of flexeril and high dose ibuprofen.  Likely a back strain that will improve with time.  Plan -ibuprofen -flexeril

## 2016-10-28 NOTE — Assessment & Plan Note (Signed)
Assessment:  Essential hypertension Patient's blood pressure is elevated today at 165/113.  Patient reports not taking his amlodipine 10mg  and lisinopril 40mg  daily for the past week.  Per review of EMR this appears to be a trend for the patient.  Patient will be given refills for his medications.  Would recommend addressing the barriers patient is having for obtaining medications in the future.  Will need follow up with PCP.  Plan  -Refill blood pressure medications -told patient to follow up in 2 weeks, would recommend getting a BMET at that time and make PCP appointment

## 2016-10-30 NOTE — Progress Notes (Signed)
Internal Medicine Clinic Attending  Case discussed with Dr. Hoffman at the time of the visit.  We reviewed the resident's history and exam and pertinent patient test results.  I agree with the assessment, diagnosis, and plan of care documented in the resident's note.  

## 2016-11-29 ENCOUNTER — Encounter: Payer: Medicare Other | Admitting: Internal Medicine

## 2016-12-13 ENCOUNTER — Encounter: Payer: Self-pay | Admitting: Internal Medicine

## 2016-12-13 ENCOUNTER — Encounter: Payer: Medicare Other | Admitting: Internal Medicine

## 2016-12-14 ENCOUNTER — Encounter: Payer: Self-pay | Admitting: Internal Medicine

## 2017-04-21 ENCOUNTER — Other Ambulatory Visit: Payer: Self-pay | Admitting: Internal Medicine

## 2017-08-31 ENCOUNTER — Encounter (HOSPITAL_COMMUNITY): Payer: Self-pay

## 2017-08-31 ENCOUNTER — Emergency Department (HOSPITAL_COMMUNITY)
Admission: EM | Admit: 2017-08-31 | Discharge: 2017-09-01 | Disposition: A | Discharge status: Home / Self Care | Payer: Medicare Other | Attending: Emergency Medicine | Admitting: Emergency Medicine

## 2017-08-31 DIAGNOSIS — Z5321 Procedure and treatment not carried out due to patient leaving prior to being seen by health care provider: Secondary | ICD-10-CM | POA: Diagnosis not present

## 2017-08-31 DIAGNOSIS — Z760 Encounter for issue of repeat prescription: Secondary | ICD-10-CM | POA: Diagnosis not present

## 2017-08-31 NOTE — ED Triage Notes (Signed)
Patient here for medication refill. States "I've been out of ALL my medication for a month and I need it refilled. I also need more colostomy bags". Denies chest pain, SHOB, nausea, vomiting, abdominal pain, or dysuria.

## 2017-09-01 NOTE — ED Notes (Signed)
No response when called for room 

## 2017-09-01 NOTE — ED Notes (Signed)
Called for registration no answer  

## 2017-10-14 ENCOUNTER — Other Ambulatory Visit: Payer: Self-pay

## 2017-10-14 ENCOUNTER — Emergency Department (HOSPITAL_COMMUNITY)
Admission: EM | Admit: 2017-10-14 | Discharge: 2017-10-14 | Disposition: A | Discharge status: Home / Self Care | Payer: Medicare Other | Attending: Emergency Medicine | Admitting: Emergency Medicine

## 2017-10-14 ENCOUNTER — Encounter (HOSPITAL_COMMUNITY): Payer: Self-pay | Admitting: Emergency Medicine

## 2017-10-14 DIAGNOSIS — Z760 Encounter for issue of repeat prescription: Secondary | ICD-10-CM

## 2017-10-14 DIAGNOSIS — Z21 Asymptomatic human immunodeficiency virus [HIV] infection status: Secondary | ICD-10-CM | POA: Diagnosis not present

## 2017-10-14 DIAGNOSIS — F17210 Nicotine dependence, cigarettes, uncomplicated: Secondary | ICD-10-CM | POA: Diagnosis not present

## 2017-10-14 DIAGNOSIS — R109 Unspecified abdominal pain: Secondary | ICD-10-CM | POA: Diagnosis not present

## 2017-10-14 DIAGNOSIS — R51 Headache: Secondary | ICD-10-CM | POA: Diagnosis not present

## 2017-10-14 DIAGNOSIS — I10 Essential (primary) hypertension: Secondary | ICD-10-CM

## 2017-10-14 LAB — COMPREHENSIVE METABOLIC PANEL
ALK PHOS: 65 U/L (ref 38–126)
ALT: 20 U/L (ref 0–44)
ANION GAP: 14 (ref 5–15)
AST: 25 U/L (ref 15–41)
Albumin: 3.5 g/dL (ref 3.5–5.0)
BUN: 13 mg/dL (ref 6–20)
CHLORIDE: 103 mmol/L (ref 98–111)
CO2: 23 mmol/L (ref 22–32)
Calcium: 9.1 mg/dL (ref 8.9–10.3)
Creatinine, Ser: 0.96 mg/dL (ref 0.61–1.24)
GFR calc non Af Amer: >60 mL/min (ref >60)
GLUCOSE: 130 mg/dL — AB (ref 70–99)
POTASSIUM: 3.4 mmol/L — AB (ref 3.5–5.1)
SODIUM: 140 mmol/L (ref 135–145)
Total Bilirubin: 0.4 mg/dL (ref 0.3–1.2)
Total Protein: 7.3 g/dL (ref 6.5–8.1)

## 2017-10-14 LAB — CBC
HEMATOCRIT: 45.5 % (ref 39.0–52.0)
HEMOGLOBIN: 15.3 g/dL (ref 13.0–17.0)
MCH: 32 pg (ref 26.0–34.0)
MCHC: 33.6 g/dL (ref 30.0–36.0)
MCV: 95.2 fL (ref 78.0–100.0)
PLATELETS: 194 10*3/uL (ref 150–400)
RBC: 4.78 MIL/uL (ref 4.22–5.81)
RDW: 12.5 % (ref 11.5–15.5)
WBC: 4.2 10*3/uL (ref 4.0–10.5)

## 2017-10-14 LAB — LIPASE, BLOOD: LIPASE: 35 U/L (ref 11–51)

## 2017-10-14 LAB — URINALYSIS, ROUTINE W REFLEX MICROSCOPIC
BILIRUBIN URINE: NEGATIVE
Bacteria, UA: NONE SEEN
GLUCOSE, UA: NEGATIVE mg/dL
Ketones, ur: NEGATIVE mg/dL
LEUKOCYTES UA: NEGATIVE
NITRITE: NEGATIVE
PH: 5 (ref 5.0–8.0)
Protein, ur: 30 mg/dL — AB
SPECIFIC GRAVITY, URINE: 1.024 (ref 1.005–1.030)

## 2017-10-14 MED ORDER — LISINOPRIL 20 MG PO TABS: 20.0000 mg | ORAL_TABLET | Freq: Every day | ORAL | 0 refills | Status: DC

## 2017-10-14 MED ORDER — ABACAVIR-DOLUTEGRAVIR-LAMIVUD 600-50-300 MG PO TABS: 1.0000 | ORAL_TABLET | Freq: Every day | ORAL | 0 refills | Status: DC

## 2017-10-14 MED ORDER — AMLODIPINE BESYLATE 10 MG PO TABS: 10.0000 mg | ORAL_TABLET | Freq: Every day | ORAL | 0 refills | Status: DC

## 2017-10-14 NOTE — ED Triage Notes (Addendum)
Pt reports high blood pressure due to not taking his medication in a month. Pt reports he can't find a PCP to refill meds. Pt reports headache and abdominal pain around colostomy bag. Pt denies nausea/vomiting.

## 2017-10-14 NOTE — ED Provider Notes (Signed)
[facility] Provider Note   CSN: [id number] Arrival date & time: 10/14/17  0007     History   Chief Complaint Chief Complaint  Patient presents with  . Hypertension    HPI Leon Nichols is a 52 y.o. male.  The history is provided by the patient.  Hypertension  This is a chronic problem. The current episode started more than 1 week ago. The problem occurs daily. The problem has not changed since onset.Associated symptoms include abdominal pain and headaches. Pertinent negatives include no chest pain and no shortness of breath. Nothing aggravates the symptoms. Nothing relieves the symptoms.  Patient with history of HIV, colostomy in place, hypertension presents for multiple complaints.  He reports has been out of all of his medicines for 1 month.  He reports he has had mild headache and abdominal pain for a month.  He reports he needs new colostomy bags.  No fever or vomiting.  No chest pain shortness of breath.  No focal weakness.  He also needs a new PCP  Past Medical History:  Diagnosis Date  . Colitis   . Diverticulosis   . Hemorrhoids   . History of shingles   . HIV (human immunodeficiency virus infection) (HCC) 03/24/2012  . Hypertension   . Perirectal abscess     Patient Active Problem List   Diagnosis Date Noted  . Acute back pain 10/28/2016  . Headache 10/15/2015  . Perforated ear drum, left 10/15/2015  . Perianal abscess 02/06/2015  . Septic arthritis (HCC) 02/05/2015  . 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 Neuralgia, post-herpetic 01/02/2014  . HIV (human immunodeficiency virus infection) (HCC) 03/24/2012  . Anemia 03/23/2012  . Essential hypertension 03/29/2007    Past Surgical History:  Procedure Laterality Date  . arthroscopic wash out of right knee    . COLONOSCOPY  08/31/2011   Procedure: COLONOSCOPY;  Surgeon: Hilarie FredricksonJohn N Perry, MD;  [facility];  Service: Endoscopy;   Laterality: N/A;  . DRESSING CHANGE UNDER ANESTHESIA  03/27/2012   Procedure: DRESSING CHANGE UNDER ANESTHESIA;  Surgeon: Currie Parishristian J Streck, MD;  [facility];  Service: General;  Laterality: N/A;  . INCISION AND DRAINAGE PERIRECTAL ABSCESS  03/23/2012   Procedure: IRRIGATION AND DEBRIDEMENT PERIRECTAL ABSCESS;  Surgeon: Mariella SaaBenjamin T Hoxworth, MD;  Location: [facility] OR;  Service: General;  Laterality: N/A;  I & D soft tissue scrotal and perineal abscess  . KNEE ARTHROSCOPY Right 02/05/2015   Procedure: [facility];  Surgeon: Sheral Apleyimothy D Murphy, MD;  [facility];  Service: Orthopedics;  Laterality: Right;  . LAPAROSCOPIC DIVERTED COLOSTOMY  03/27/2012   Procedure: LAPAROSCOPIC DIVERTED COLOSTOMY;  Surgeon: Currie Parishristian J Streck, MD;  Location: [facility] OR;  Service: General;  Laterality: N/A;        Home Medications    Prior to Admission medications   Medication Sig Start Date End Date Taking? Authorizing Provider  abacavir-dolutegravir-lamiVUDine (TRIUMEQ) 600-50-300 MG tablet Take 1 tablet by mouth daily. 10/14/17   Zadie RhineWickline, Karna Abed, MD  amLODipine (NORVASC) 10 MG tablet Take 1 tablet (10 mg total) by mouth daily. 10/14/17   Zadie RhineWickline, Jacquie Lukes, MD  lisinopril (PRINIVIL,ZESTRIL) 20 MG tablet Take 1 tablet (20 mg total) by mouth daily. 10/14/17   Zadie RhineWickline, Indira Sorenson, MD  Ostomy Supplies POUCH MISC 1 Container by Does not apply route daily. Patient not taking: Reported on 10/15/2015 02/04/14   Servando SnareBurns, Alexa R, MD    Family History Family History  Problem Relation Age of Onset  . Hypertension Mother   . Diabetes Mother     Social History Social History   Tobacco Use  . Smoking status: Heavy Tobacco Smoker    Packs/day: 1.00    Years: 15.00    Pack years: 15.00    Types: Cigarettes  . Smokeless tobacco: Never Used  . Tobacco comment:  5CIGARETTES A DAY  Substance Use Topics  . Alcohol use: Yes    Alcohol/week: 0.0 oz    Comment: rarely.  . Drug use: No    Types: Marijuana,  Cocaine     Allergies   Patient has no known allergies.   Review of Systems Review of Systems  Constitutional: Negative for fever.  Respiratory: Negative for shortness of breath.   Cardiovascular: Negative for chest pain.  Gastrointestinal: Positive for abdominal pain.  Neurological: Positive for headaches. Negative for weakness.  All other systems reviewed and are negative.    Physical Exam Updated Vital Signs BP (!) 162/118   Pulse 80   Temp 98.2 F (36.8 C) (Oral)   Resp 17   Ht 1.854 m (6\' 1" )   Wt 104.3 kg (230 lb)   SpO2 99%   BMI 30.34 kg/m   Physical Exam  CONSTITUTIONAL: Well developed/well nourished, watching television HEAD: Normocephalic/atraumatic EYES: EOMI/PERRL ENMT: Mucous membranes moist NECK: supple no meningeal signs SPINE/BACK:entire spine nontender CV: S1/S2 noted, no murmurs/rubs/gallops noted LUNGS: Lungs are clear to auscultation bilaterally, no apparent distress ABDOMEN: soft, nontender, no rebound or guarding, bowel sounds noted throughout abdomen, ostomy in place, stoma appears normal and pink GU:no cva tenderness NEURO: Pt is awake/alert/appropriate, moves all extremitiesx4.  No facial droop.  No arm or leg drift. EXTREMITIES: pulses normal/equal, full ROM SKIN: warm, color normal PSYCH: no abnormalities of mood noted, alert and oriented to situation  ED Treatments / Results  Labs (all labs ordered are listed, but only abnormal results are displayed) Labs Reviewed  COMPREHENSIVE METABOLIC PANEL - Abnormal; Notable for the following components:      Result Value   Potassium 3.4 (*)    Glucose, Bld 130 (*)    All other components within normal limits  URINALYSIS, ROUTINE W REFLEX MICROSCOPIC - Abnormal; Notable for the following components:   Hgb urine dipstick SMALL (*)    Protein, ur 30 (*)    All other components within normal limits  LIPASE, BLOOD  CBC    EKG None  Radiology No results  found.  Procedures Procedures   Medications Ordered in ED Medications - No data to display   Initial Impression / Assessment and Plan / ED Course  I have reviewed the triage vital signs and the nursing notes.  Pertinent labs results that were available during my care of the patient were reviewed by me and considered in my medical decision making (see chart for details).     Here for med refill and ostomy bags He is well-appearing, no acute distress.  No signs of hypertensive emergency.  He has no focal abdominal tenderness.  Labs reassuring. Refill meds for 1 month.  Given referral to PCP.  I asked nursing to provide him with ostomy supplies  Final Clinical Impressions(s) / ED Diagnoses   Final diagnoses:  Essential hypertension  Medication refill    ED Discharge Orders        Ordered    abacavir-dolutegravir-lamiVUDine (TRIUMEQ) 600-50-300 MG tablet  Daily     10/14/17 0339    amLODipine (NORVASC) 10 MG tablet  Daily     10/14/17 0339    lisinopril (PRINIVIL,ZESTRIL) 20 MG tablet  Daily     10/14/17 0339       Zadie Rhine, MD 10/14/17 [phone]

## 2017-10-24 ENCOUNTER — Emergency Department (HOSPITAL_COMMUNITY)
Admission: EM | Admit: 2017-10-24 | Discharge: 2017-10-25 | Disposition: A | Discharge status: Home / Self Care | Payer: Medicare Other | Attending: Emergency Medicine | Admitting: Emergency Medicine

## 2017-10-24 ENCOUNTER — Other Ambulatory Visit: Payer: Self-pay

## 2017-10-24 ENCOUNTER — Encounter (HOSPITAL_COMMUNITY): Payer: Self-pay | Admitting: *Deleted

## 2017-10-24 DIAGNOSIS — F17210 Nicotine dependence, cigarettes, uncomplicated: Secondary | ICD-10-CM | POA: Insufficient documentation

## 2017-10-24 DIAGNOSIS — K5732 Diverticulitis of large intestine without perforation or abscess without bleeding: Secondary | ICD-10-CM | POA: Insufficient documentation

## 2017-10-24 DIAGNOSIS — I10 Essential (primary) hypertension: Secondary | ICD-10-CM | POA: Diagnosis not present

## 2017-10-24 DIAGNOSIS — Z21 Asymptomatic human immunodeficiency virus [HIV] infection status: Secondary | ICD-10-CM | POA: Diagnosis not present

## 2017-10-24 DIAGNOSIS — R1084 Generalized abdominal pain: Secondary | ICD-10-CM | POA: Diagnosis not present

## 2017-10-24 DIAGNOSIS — Z933 Colostomy status: Secondary | ICD-10-CM | POA: Diagnosis not present

## 2017-10-24 DIAGNOSIS — Z79899 Other long term (current) drug therapy: Secondary | ICD-10-CM | POA: Diagnosis not present

## 2017-10-24 DIAGNOSIS — R110 Nausea: Secondary | ICD-10-CM | POA: Diagnosis not present

## 2017-10-24 DIAGNOSIS — K5792 Diverticulitis of intestine, part unspecified, without perforation or abscess without bleeding: Secondary | ICD-10-CM

## 2017-10-24 LAB — CBC WITH DIFFERENTIAL/PLATELET
Abs Immature Granulocytes: 0 10*3/uL (ref 0.0–0.1)
BASOS ABS: 0 10*3/uL (ref 0.0–0.1)
Basophils Relative: 0 %
EOS ABS: 0 10*3/uL (ref 0.0–0.7)
EOS PCT: 0 %
HEMATOCRIT: 45.9 % (ref 39.0–52.0)
HEMOGLOBIN: 15 g/dL (ref 13.0–17.0)
IMMATURE GRANULOCYTES: 0 %
LYMPHS ABS: 2.5 10*3/uL (ref 0.7–4.0)
LYMPHS PCT: 48 %
MCH: 31.3 pg (ref 26.0–34.0)
MCHC: 32.7 g/dL (ref 30.0–36.0)
MCV: 95.8 fL (ref 78.0–100.0)
Monocytes Absolute: 0.5 10*3/uL (ref 0.1–1.0)
Monocytes Relative: 10 %
NEUTROS PCT: 42 %
Neutro Abs: 2.2 10*3/uL (ref 1.7–7.7)
Platelets: 156 10*3/uL (ref 150–400)
RBC: 4.79 MIL/uL (ref 4.22–5.81)
RDW: 12.8 % (ref 11.5–15.5)
WBC: 5.3 10*3/uL (ref 4.0–10.5)

## 2017-10-24 MED ORDER — SODIUM CHLORIDE 0.9 % IV BOLUS
1000.0000 mL | Freq: Once | INTRAVENOUS | Status: AC
  Administered 2017-10-24: 1000 mL via INTRAVENOUS

## 2017-10-24 MED ORDER — MORPHINE SULFATE (PF) 4 MG/ML IV SOLN
4.0000 mg | Freq: Once | INTRAVENOUS | Status: AC
  Administered 2017-10-24: 4 mg via INTRAVENOUS
  Filled 2017-10-24: qty 1

## 2017-10-24 MED ORDER — ONDANSETRON HCL 4 MG/2ML IJ SOLN
4.0000 mg | Freq: Once | INTRAMUSCULAR | Status: AC
Start: 2017-10-24 — End: 2017-10-24
  Administered 2017-10-24: 4 mg via INTRAVENOUS
  Filled 2017-10-24: qty 2

## 2017-10-24 NOTE — ED Notes (Signed)
ED Provider at bedside. 

## 2017-10-24 NOTE — ED Notes (Signed)
New ostomy bag applied, pt given blue scrubs and sent to waiting room to wait.

## 2017-10-24 NOTE — ED Notes (Signed)
[facility]

## 2017-10-24 NOTE — ED Notes (Signed)
A 11 form sent to materials for new ostomy bag

## 2017-10-24 NOTE — ED Provider Notes (Signed)
Patient placed in Quick Look pathway, seen and evaluated   Chief Complaint: needs a colostomy bag.  Pt concerned site is infected  ROS: no fever no chills   Physical Exam:   Gen: No distress  Neuro: Awake and Alert  Skin: Warm    Focused Exam: stool on clothing, bag nonadhering    Initiation of care has begun. The patient has been counseled on the process, plan, and necessity for staying for the completion/evaluation, and the remainder of the medical screening examination   Osie CheeksSofia, Ponciano Shealy K, PA-C 10/24/17 1859    Derwood KaplanNanavati, Ankit, MD 10/26/17 [phone]

## 2017-10-24 NOTE — ED Provider Notes (Signed)
[facility] Note   CSN: [id number] Arrival date & time: 10/24/17  1742     History   Chief Complaint Chief Complaint  Patient presents with  . Wound Infection    HPI Leon Nichols is a [age] y.o. male.  Patient is a [age] year old male with past medical history of HIV disease, hypertension, and prior colostomy.  He presents today for evaluation of abdominal pain.  This is been worsening over the past week.  His pain is generalized and constant.  He denies any fevers or chills.  He does report having intermittent bright red blood in the colostomy bag.  The history is provided by the patient.  Abdominal Pain   This is a new problem. Episode onset: 1 week ago. The problem occurs constantly. The problem has been gradually worsening. The pain is associated with an unknown factor. The pain is located in the generalized abdominal region. The quality of the pain is cramping. The pain is moderate. Associated symptoms include nausea. Pertinent negatives include fever, melena, vomiting and dysuria. Nothing aggravates the symptoms. Nothing relieves the symptoms.    Past Medical History:  Diagnosis Date  . Colitis   . Diverticulosis   . Hemorrhoids   . History of shingles   . HIV (human immunodeficiency virus infection) (HCC) 03/24/2012  . Hypertension   . Perirectal abscess     Patient Active Problem List   Diagnosis Date Noted  . Acute back pain 10/28/2016  . Headache 10/15/2015  . Perforated ear drum, left 10/15/2015  . Perianal abscess 02/06/2015  . Septic arthritis (HCC) 02/05/2015  . 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 Neuralgia, post-herpetic 01/02/2014  . HIV (human immunodeficiency virus infection) (HCC) 03/24/2012  . Anemia 03/23/2012  . Essential hypertension 03/29/2007    Past Surgical History:  Procedure Laterality Date  . arthroscopic wash out of right knee    . COLONOSCOPY  08/31/2011     Procedure: COLONOSCOPY;  Surgeon: Hilarie FredricksonJohn N Perry, MD;  [facility];  Service: Endoscopy;  Laterality: N/A;  . DRESSING CHANGE UNDER ANESTHESIA  03/27/2012   Procedure: DRESSING CHANGE UNDER ANESTHESIA;  Surgeon: Currie Parishristian J Streck, MD;  [facility];  Service: General;  Laterality: N/A;  . INCISION AND DRAINAGE PERIRECTAL ABSCESS  03/23/2012   Procedure: IRRIGATION AND DEBRIDEMENT PERIRECTAL ABSCESS;  Surgeon: Mariella SaaBenjamin T Hoxworth, MD;  Location: [facility] OR;  Service: General;  Laterality: N/A;  I & D soft tissue scrotal and perineal abscess  . KNEE ARTHROSCOPY Right 02/05/2015   [facility];  Surgeon: Sheral Apleyimothy D Murphy, MD;  [facility];  Service: Orthopedics;  Laterality: Right;  . LAPAROSCOPIC DIVERTED COLOSTOMY  03/27/2012   Procedure: LAPAROSCOPIC DIVERTED COLOSTOMY;  Surgeon: Currie Parishristian J Streck, MD;  Location: [facility] OR;  Service: General;  Laterality: N/A;        Home Medications    Prior to Admission medications   Medication Sig Start Date End Date Taking? Authorizing Provider  abacavir-dolutegravir-lamiVUDine (TRIUMEQ) 600-50-300 MG tablet Take 1 tablet by mouth daily. 10/14/17  Yes Zadie RhineWickline, Donald, MD  amLODipine (NORVASC) 10 MG tablet Take 1 tablet (10 mg total) by mouth daily. 10/14/17  Yes Zadie RhineWickline, Donald, MD  lisinopril (PRINIVIL,ZESTRIL) 20 MG tablet Take 1 tablet (20 mg total) by mouth daily. 10/14/17  Yes Zadie RhineWickline, Donald, MD  Ostomy Supplies POUCH MISC 1 Container by Does not apply route daily. 02/04/14   Servando SnareBurns, Alexa R, MD  Family History Family History  Problem Relation Age of Onset  . Hypertension Mother   . Diabetes Mother     Social History Social History   Tobacco Use  . Smoking status: Heavy Tobacco Smoker    Packs/day: 1.00    Years: 15.00    Pack years: 15.00    Types: Cigarettes  . Smokeless tobacco: Never Used  . Tobacco comment:  5CIGARETTES A DAY  Substance Use Topics  . Alcohol use: Yes    Alcohol/week:  0.0 oz    Comment: rarely.  . Drug use: No    Types: Marijuana, Cocaine     Allergies   Patient has no known allergies.   Review of Systems Review of Systems  Constitutional: Negative for fever.  Gastrointestinal: Positive for abdominal pain and nausea. Negative for melena and vomiting.  Genitourinary: Negative for dysuria.  All other systems reviewed and are negative.    Physical Exam Updated Vital Signs BP (!) 119/95   Pulse 87   Temp 98.6 F (37 C) (Oral)   Resp 16   SpO2 98%   Physical Exam  Constitutional: He is oriented to person, place, and time. He appears well-developed and well-nourished. No distress.  HENT:  Head: Normocephalic and atraumatic.  Mouth/Throat: Oropharynx is clear and moist.  Neck: Normal range of motion. Neck supple.  Cardiovascular: Normal rate and regular rhythm. Exam reveals no friction rub.  No murmur heard. Pulmonary/Chest: Effort normal and breath sounds normal. No respiratory distress. He has no wheezes. He has no rales.  Abdominal: Soft. Bowel sounds are normal. He exhibits no distension. There is tenderness. There is no rebound and no guarding.  There is generalized abdominal tenderness.  The ostomy site appears clean and intact.  Musculoskeletal: Normal range of motion. He exhibits no edema.  Neurological: He is alert and oriented to person, place, and time. Coordination normal.  Skin: Skin is warm and dry. He is not diaphoretic.  Nursing note and vitals reviewed.    ED Treatments / Results  Labs (all labs ordered are listed, but only abnormal results are displayed) Labs Reviewed  COMPREHENSIVE METABOLIC PANEL  CBC WITH DIFFERENTIAL/PLATELET  LIPASE, BLOOD    EKG None  Radiology No results found.  Procedures Procedures (including critical care time)  Medications Ordered in ED Medications  sodium chloride 0.9 % bolus 1,000 mL (has no administration in time range)  morphine 4 MG/ML injection 4 mg (has no  administration in time range)  ondansetron (ZOFRAN) injection 4 mg (has no administration in time range)     Initial Impression / Assessment and Plan / ED Course  I have reviewed the triage vital signs and the nursing notes.  Pertinent labs & imaging results that were available during my care of the patient were reviewed by me and considered in my medical decision making (see chart for details).  Patient presents with abdominal pain.  He has a history of colostomy.  He has no fever or white count, but CT scan does suggest diverticulitis/colitis.  He will be treated with Cipro and Flagyl, pain meds, and outpatient follow-up with GI.  The CT scan also cannot rule out a colonic mass and is suggesting follow-up into this.  Patient informed of this finding and is to see GI as an outpatient.  Final Clinical Impressions(s) / ED Diagnoses   Final diagnoses:  None    ED Discharge Orders    None       Geoffery Lyons, MD 10/25/17 [phone]

## 2017-10-24 NOTE — ED Triage Notes (Signed)
Pt reports possible infection to colostomy site. Having fever and chills x 2 days.

## 2017-10-24 NOTE — ED Notes (Signed)
Nurse currently collecting labs. 

## 2017-10-25 ENCOUNTER — Emergency Department (HOSPITAL_COMMUNITY): Payer: Medicare Other

## 2017-10-25 DIAGNOSIS — K5732 Diverticulitis of large intestine without perforation or abscess without bleeding: Secondary | ICD-10-CM | POA: Diagnosis not present

## 2017-10-25 DIAGNOSIS — R1084 Generalized abdominal pain: Secondary | ICD-10-CM | POA: Diagnosis not present

## 2017-10-25 LAB — COMPREHENSIVE METABOLIC PANEL
ALBUMIN: 3.4 g/dL — AB (ref 3.5–5.0)
ALT: 21 U/L (ref 0–44)
ANION GAP: 10 (ref 5–15)
AST: 31 U/L (ref 15–41)
Alkaline Phosphatase: 54 U/L (ref 38–126)
BUN: 17 mg/dL (ref 6–20)
CALCIUM: 8.2 mg/dL — AB (ref 8.9–10.3)
CO2: 21 mmol/L — AB (ref 22–32)
Chloride: 109 mmol/L (ref 98–111)
Creatinine, Ser: 0.92 mg/dL (ref 0.61–1.24)
GFR calc Af Amer: >60 mL/min (ref >60)
GFR calc non Af Amer: >60 mL/min (ref >60)
GLUCOSE: 98 mg/dL (ref 70–99)
POTASSIUM: 4 mmol/L (ref 3.5–5.1)
SODIUM: 140 mmol/L (ref 135–145)
Total Bilirubin: 0.4 mg/dL (ref 0.3–1.2)
Total Protein: 7.4 g/dL (ref 6.5–8.1)

## 2017-10-25 LAB — LIPASE, BLOOD: Lipase: 32 U/L (ref 11–51)

## 2017-10-25 MED ORDER — CIPROFLOXACIN HCL 500 MG PO TABS: 500.0000 mg | ORAL_TABLET | Freq: Two times a day (BID) | ORAL | 0 refills | Status: DC

## 2017-10-25 MED ORDER — HYDROCODONE-ACETAMINOPHEN 5-325 MG PO TABS: 1.0000 | ORAL_TABLET | Freq: Four times a day (QID) | ORAL | 0 refills | Status: DC | PRN

## 2017-10-25 MED ORDER — METRONIDAZOLE 500 MG PO TABS: 500.0000 mg | ORAL_TABLET | Freq: Three times a day (TID) | ORAL | 0 refills | Status: DC

## 2017-10-25 MED ORDER — IOHEXOL 300 MG/ML  SOLN
100.0000 mL | Freq: Once | INTRAMUSCULAR | Status: AC | PRN
  Administered 2017-10-25: 100 mL via INTRAVENOUS

## 2017-10-25 NOTE — ED Notes (Signed)
ED Provider at bedside. 

## 2017-10-25 NOTE — ED Notes (Signed)
Pt refused to sign/pt refused discharge vital signs.

## 2017-10-25 NOTE — Discharge Instructions (Signed)
Cipro and Flagyl as prescribed.  Hydrocodone as prescribed as needed for pain.  Follow-up with gastroenterology.  The contact information for [facility] has been provided in this discharge summary for you to call and make these arrangements.  Return to the emergency department if symptoms significantly worsen or change.

## 2017-10-26 ENCOUNTER — Encounter (HOSPITAL_COMMUNITY): Payer: Self-pay

## 2017-10-26 ENCOUNTER — Other Ambulatory Visit: Payer: Self-pay

## 2017-10-26 DIAGNOSIS — I10 Essential (primary) hypertension: Secondary | ICD-10-CM | POA: Insufficient documentation

## 2017-10-26 DIAGNOSIS — F17210 Nicotine dependence, cigarettes, uncomplicated: Secondary | ICD-10-CM | POA: Diagnosis not present

## 2017-10-26 DIAGNOSIS — Z433 Encounter for attention to colostomy: Secondary | ICD-10-CM | POA: Diagnosis not present

## 2017-10-26 DIAGNOSIS — R197 Diarrhea, unspecified: Secondary | ICD-10-CM | POA: Insufficient documentation

## 2017-10-26 DIAGNOSIS — K9400 Colostomy complication, unspecified: Secondary | ICD-10-CM | POA: Diagnosis not present

## 2017-10-26 DIAGNOSIS — Z79899 Other long term (current) drug therapy: Secondary | ICD-10-CM | POA: Diagnosis not present

## 2017-10-26 DIAGNOSIS — R1084 Generalized abdominal pain: Secondary | ICD-10-CM | POA: Diagnosis present

## 2017-10-26 DIAGNOSIS — B20 Human immunodeficiency virus [HIV] disease: Secondary | ICD-10-CM | POA: Diagnosis not present

## 2017-10-26 LAB — CBC
HCT: 45.8 % (ref 39.0–52.0)
Hemoglobin: 16 g/dL (ref 13.0–17.0)
MCH: 32.9 pg (ref 26.0–34.0)
MCHC: 34.9 g/dL (ref 30.0–36.0)
MCV: 94.2 fL (ref 78.0–100.0)
PLATELETS: 149 10*3/uL — AB (ref 150–400)
RBC: 4.86 MIL/uL (ref 4.22–5.81)
RDW: 12.8 % (ref 11.5–15.5)
WBC: 4.7 10*3/uL (ref 4.0–10.5)

## 2017-10-26 NOTE — ED Triage Notes (Signed)
Pt presents to ED from home for ABD pain and colostomy problem. Pt reports that he has been having pain, went to another ED, and they put a new colostomy bag on. Pt reports that the bag wasn't put on properly so he took it off. PT reports that he hasn't been eating today because he doesn't want to have more stool. PT not currently wearing ostomy bag.

## 2017-10-27 ENCOUNTER — Emergency Department (HOSPITAL_COMMUNITY)
Admission: EM | Admit: 2017-10-27 | Discharge: 2017-10-27 | Disposition: A | Discharge status: Home / Self Care | Payer: Medicare Other | Attending: Emergency Medicine | Admitting: Emergency Medicine

## 2017-10-27 DIAGNOSIS — K9400 Colostomy complication, unspecified: Secondary | ICD-10-CM

## 2017-10-27 DIAGNOSIS — R197 Diarrhea, unspecified: Secondary | ICD-10-CM

## 2017-10-27 LAB — COMPREHENSIVE METABOLIC PANEL
ALK PHOS: 62 U/L (ref 38–126)
ALT: 19 U/L (ref 0–44)
ANION GAP: 13 (ref 5–15)
AST: 27 U/L (ref 15–41)
Albumin: 3.6 g/dL (ref 3.5–5.0)
BILIRUBIN TOTAL: 0.8 mg/dL (ref 0.3–1.2)
BUN: 9 mg/dL (ref 6–20)
CALCIUM: 9.2 mg/dL (ref 8.9–10.3)
CO2: 22 mmol/L (ref 22–32)
Chloride: 105 mmol/L (ref 98–111)
Creatinine, Ser: 0.67 mg/dL (ref 0.61–1.24)
GFR calc non Af Amer: >60 mL/min (ref >60)
Glucose, Bld: 104 mg/dL — ABNORMAL HIGH (ref 70–99)
Potassium: 3.6 mmol/L (ref 3.5–5.1)
Sodium: 140 mmol/L (ref 135–145)
TOTAL PROTEIN: 8.4 g/dL — AB (ref 6.5–8.1)

## 2017-10-27 LAB — LIPASE, BLOOD: Lipase: 32 U/L (ref 11–51)

## 2017-10-27 NOTE — Discharge Instructions (Addendum)
Please purchase additional bags at a medical supply store

## 2017-10-27 NOTE — ED Notes (Signed)
Pt stated "I want to put my own bag on because it was not done right last time."

## 2017-10-27 NOTE — ED Notes (Signed)
Pt stated he has not taken his blood pressure medication today because he did not have an ostomy bag on

## 2017-10-27 NOTE — ED Provider Notes (Signed)
[facility]-EMERGENCY DEPT Provider Note   CSN: [id number] Arrival date & time: 10/26/17  2000     History   Chief Complaint Chief Complaint  Patient presents with  . Abdominal Pain    HPI Leon Nichols is a 52 y.o. male.  HPI 52 year old male presenting with complication of his colostomy tube.  He states he is out of colostomy bags and needs a new bag.  He has had increased watery stool in his colostomy bag today.  He is currently without a primary care physician.  He reports crampy abdominal pain.  No fevers or chills.  Denies nausea or vomiting.  Symptoms are mild in severity   Past Medical History:  Diagnosis Date  . Colitis   . Diverticulosis   . Hemorrhoids   . History of shingles   . HIV (human immunodeficiency virus infection) (HCC) 03/24/2012  . Hypertension   . Perirectal abscess     Patient Active Problem List   Diagnosis Date Noted  . Acute back pain 10/28/2016  . Headache 10/15/2015  . Perforated ear drum, left 10/15/2015  . Perianal abscess 02/06/2015  . Septic arthritis (HCC) 02/05/2015  . 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 Neuralgia, post-herpetic 01/02/2014  . HIV (human immunodeficiency virus infection) (HCC) 03/24/2012  . Anemia 03/23/2012  . Essential hypertension 03/29/2007    Past Surgical History:  Procedure Laterality Date  . arthroscopic wash out of right knee    . COLONOSCOPY  08/31/2011   Procedure: COLONOSCOPY;  Surgeon: Hilarie FredricksonJohn N Perry, MD;  [facility];  Service: Endoscopy;  Laterality: N/A;  . DRESSING CHANGE UNDER ANESTHESIA  03/27/2012   Procedure: DRESSING CHANGE UNDER ANESTHESIA;  Surgeon: Currie Parishristian J Streck, MD;  [facility];  Service: General;  Laterality: N/A;  . INCISION AND DRAINAGE PERIRECTAL ABSCESS  03/23/2012   Procedure: IRRIGATION AND DEBRIDEMENT PERIRECTAL ABSCESS;  Surgeon: Mariella SaaBenjamin T Hoxworth, MD;  Location: [facility] OR;  Service: General;  Laterality:  N/A;  I & D soft tissue scrotal and perineal abscess  . KNEE ARTHROSCOPY Right 02/05/2015   [facility];  Surgeon: Sheral Apleyimothy D Murphy, MD;  [facility];  Service: Orthopedics;  Laterality: Right;  . LAPAROSCOPIC DIVERTED COLOSTOMY  03/27/2012   Procedure: LAPAROSCOPIC DIVERTED COLOSTOMY;  Surgeon: Currie Parishristian J Streck, MD;  Location: [facility] OR;  Service: General;  Laterality: N/A;        Home Medications    Prior to Admission medications   Medication Sig Start Date End Date Taking? Authorizing Provider  abacavir-dolutegravir-lamiVUDine (TRIUMEQ) 600-50-300 MG tablet Take 1 tablet by mouth daily. 10/14/17   Zadie RhineWickline, Donald, MD  amLODipine (NORVASC) 10 MG tablet Take 1 tablet (10 mg total) by mouth daily. 10/14/17   Zadie RhineWickline, Donald, MD  ciprofloxacin (CIPRO) 500 MG tablet Take 1 tablet (500 mg total) by mouth 2 (two) times daily. One po bid x 7 days 10/25/17   Geoffery Lyonselo, Douglas, MD  HYDROcodone-acetaminophen (NORCO) 5-325 MG tablet Take 1-2 tablets by mouth every 6 (six) hours as needed. 10/25/17   Geoffery Lyonselo, Douglas, MD  lisinopril (PRINIVIL,ZESTRIL) 20 MG tablet Take 1 tablet (20 mg total) by mouth daily. 10/14/17   Zadie RhineWickline, Donald, MD  metroNIDAZOLE (FLAGYL) 500 MG tablet Take 1 tablet (500 mg total) by mouth 3 (three) times daily. One po tid x 7 days 10/25/17   Geoffery Lyonselo, Douglas, MD  [facility]st[facility] by Does not apply route daily. 02/04/14  Servando Snare, MD    Family History Family History  Problem Relation Age of Onset  . Hypertension Mother   . Diabetes Mother     Social History Social History   Tobacco Use  . Smoking status: Heavy Tobacco Smoker    Packs/day: 1.00    Years: 15.00    Pack years: 15.00    Types: Cigarettes  . Smokeless tobacco: Never Used  . Tobacco comment:  5CIGARETTES A DAY  Substance Use Topics  . Alcohol use: Yes    Alcohol/week: 0.0 oz    Comment: rarely.  . Drug use: No    Types: Marijuana, Cocaine      Allergies   Patient has no known allergies.   Review of Systems Review of Systems  All other systems reviewed and are negative.    Physical Exam Updated Vital Signs BP (!) 194/140 (BP Location: Right Arm) Comment: pt moving   Pulse 82   Temp 98.3 F (36.8 C) (Oral)   Resp (!) 22   Ht 6\' 1"  (1.854 m)   Wt 104.3 kg (230 lb)   SpO2 100%   BMI 30.34 kg/m   Physical Exam  Constitutional: He is oriented to person, place, and time. He appears well-developed and well-nourished.  HENT:  Head: Normocephalic.  Eyes: EOM are normal.  Neck: Normal range of motion.  Pulmonary/Chest: Effort normal.  Abdominal: Soft. He exhibits no distension. There is no tenderness.  Musculoskeletal: Normal range of motion.  Neurological: He is alert and oriented to person, place, and time.  Psychiatric: He has a normal mood and affect.  Nursing note and vitals reviewed.    ED Treatments / Results  Labs (all labs ordered are listed, but only abnormal results are displayed) Labs Reviewed  CBC - Abnormal; Notable for the following components:      Result Value   Platelets 149 (*)    All other components within normal limits  COMPREHENSIVE METABOLIC PANEL - Abnormal; Notable for the following components:   Glucose, Bld 104 (*)    Total Protein 8.4 (*)    All other components within normal limits  LIPASE, BLOOD  URINALYSIS, ROUTINE W REFLEX MICROSCOPIC    EKG None  Radiology No results found.  Procedures Procedures (including critical care time)  Medications Ordered in ED Medications - No data to display   Initial Impression / Assessment and Plan / ED Course  I have reviewed the triage vital signs and the nursing notes.  Pertinent labs & imaging results that were available during my care of the patient were reviewed by me and considered in my medical decision making (see chart for details).     New colostomy bag applied.  Abdominal exam is without tenderness.  He has had  increased loose stool and likely has a diarrheal illness.  He is tolerating oral fluids.  His electrolytes are normal.  Discharged home in good condition.  Final Clinical Impressions(s) / ED Diagnoses   Final diagnoses:  Complication of colostomy (HCC)  Diarrhea, unspecified type    ED Discharge Orders    None       Azalia Bilis, MD 10/27/17 [phone]

## 2017-11-17 ENCOUNTER — Other Ambulatory Visit: Payer: Self-pay

## 2017-11-17 ENCOUNTER — Emergency Department (HOSPITAL_COMMUNITY)
Admission: EM | Admit: 2017-11-17 | Discharge: 2017-11-17 | Disposition: A | Discharge status: Home / Self Care | Payer: Medicare Other | Attending: Emergency Medicine | Admitting: Emergency Medicine

## 2017-11-17 ENCOUNTER — Encounter (HOSPITAL_COMMUNITY): Payer: Self-pay

## 2017-11-17 DIAGNOSIS — K9400 Colostomy complication, unspecified: Secondary | ICD-10-CM | POA: Diagnosis not present

## 2017-11-17 DIAGNOSIS — R1032 Left lower quadrant pain: Secondary | ICD-10-CM | POA: Diagnosis not present

## 2017-11-17 DIAGNOSIS — K9409 Other complications of colostomy: Secondary | ICD-10-CM | POA: Diagnosis not present

## 2017-11-17 DIAGNOSIS — F17210 Nicotine dependence, cigarettes, uncomplicated: Secondary | ICD-10-CM | POA: Diagnosis not present

## 2017-11-17 DIAGNOSIS — B20 Human immunodeficiency virus [HIV] disease: Secondary | ICD-10-CM | POA: Insufficient documentation

## 2017-11-17 DIAGNOSIS — I10 Essential (primary) hypertension: Secondary | ICD-10-CM | POA: Diagnosis not present

## 2017-11-17 DIAGNOSIS — Z79899 Other long term (current) drug therapy: Secondary | ICD-10-CM | POA: Insufficient documentation

## 2017-11-17 LAB — TYPE AND SCREEN
ABO/RH(D): O POS
Antibody Screen: NEGATIVE

## 2017-11-17 LAB — COMPREHENSIVE METABOLIC PANEL
ALBUMIN: 4 g/dL (ref 3.5–5.0)
ALK PHOS: 73 U/L (ref 38–126)
ALT: 38 U/L (ref 0–44)
ANION GAP: 14 (ref 5–15)
AST: 76 U/L — ABNORMAL HIGH (ref 15–41)
BUN: 12 mg/dL (ref 6–20)
CALCIUM: 9.4 mg/dL (ref 8.9–10.3)
CO2: 24 mmol/L (ref 22–32)
Chloride: 102 mmol/L (ref 98–111)
Creatinine, Ser: 0.81 mg/dL (ref 0.61–1.24)
GFR calc Af Amer: >60 mL/min (ref >60)
GFR calc non Af Amer: >60 mL/min (ref >60)
GLUCOSE: 132 mg/dL — AB (ref 70–99)
Potassium: 3.4 mmol/L — ABNORMAL LOW (ref 3.5–5.1)
SODIUM: 140 mmol/L (ref 135–145)
Total Bilirubin: 0.7 mg/dL (ref 0.3–1.2)
Total Protein: 8.7 g/dL — ABNORMAL HIGH (ref 6.5–8.1)

## 2017-11-17 LAB — CBC
HEMATOCRIT: 45.3 % (ref 39.0–52.0)
Hemoglobin: 16 g/dL (ref 13.0–17.0)
MCH: 32.8 pg (ref 26.0–34.0)
MCHC: 35.1 g/dL (ref 30.0–36.0)
MCV: 93.4 fL (ref 78.0–100.0)
Platelets: 111 10*3/uL — ABNORMAL LOW (ref 150–400)
RBC: 4.85 MIL/uL (ref 4.22–5.81)
RDW: 13.2 % (ref 11.5–15.5)
WBC: 5.4 10*3/uL (ref 4.0–10.5)

## 2017-11-17 LAB — OCCULT BLOOD X 1 CARD TO LAB, STOOL: Fecal Occult Bld: POSITIVE — AB

## 2017-11-17 MED ORDER — LISINOPRIL 20 MG PO TABS: 20.0000 mg | ORAL_TABLET | Freq: Every day | ORAL | 1 refills | Status: DC

## 2017-11-17 MED ORDER — AMLODIPINE BESYLATE 10 MG PO TABS: 10.0000 mg | ORAL_TABLET | Freq: Every day | ORAL | 1 refills | Status: DC

## 2017-11-17 NOTE — ED Provider Notes (Signed)
[facility]-EMERGENCY DEPT Provider Note   CSN: [id number] Arrival date & time: 11/17/17  1436     History   Chief Complaint Chief Complaint  Patient presents with  . Abdominal Pain  . Blood In Stools    HPI Leon Nichols is a 52 y.o. male.  Patient is concerned about his colostomy in the left lower quadrant.  Apparently he did not get his normal monthly delivery of 30 colostomy bags and he did not make the necessary calls to get replacements.  He has been using his same bag for several days.  He is concerned that there is a leak in the bag.  Also the ostomy site is slightly irritated.  Otherwise he feels well.  No fever, sweats, chills, chest pain, dyspnea, abdominal pain, nausea, vomiting, diarrhea..  He has not followed up with the local general surgery group.  Additionally, he has run out of his blood pressure medication.  Severity of symptoms is mild.     Past Medical History:  Diagnosis Date  . Colitis   . Diverticulosis   . Hemorrhoids   . History of shingles   . HIV (human immunodeficiency virus infection) (HCC) 03/24/2012  . Hypertension   . Perirectal abscess     Patient Active Problem List   Diagnosis Date Noted  . Acute back pain 10/28/2016  . Headache 10/15/2015  . Perforated ear drum, left 10/15/2015  . Perianal abscess 02/06/2015  . Septic arthritis (HCC) 02/05/2015  . 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 Neuralgia, post-herpetic 01/02/2014  . HIV (human immunodeficiency virus infection) (HCC) 03/24/2012  . Anemia 03/23/2012  . Essential hypertension 03/29/2007    Past Surgical History:  Procedure Laterality Date  . arthroscopic wash out of right knee    . COLONOSCOPY  08/31/2011   Procedure: COLONOSCOPY;  Surgeon: Hilarie Fredrickson, MD;  Location: [facility] ENDOSCOPY;  Service: Endoscopy;  Laterality: N/A;  . DRESSING CHANGE UNDER ANESTHESIA  03/27/2012   Procedure: DRESSING CHANGE UNDER ANESTHESIA;   Surgeon: Currie Paris, MD;  Location: [facility] OR;  Service: General;  Laterality: N/A;  . INCISION AND DRAINAGE PERIRECTAL ABSCESS  03/23/2012   Procedure: IRRIGATION AND DEBRIDEMENT PERIRECTAL ABSCESS;  Surgeon: Mariella Saa, MD;  Location: [facility] OR;  Service: General;  Laterality: N/A;  I & D soft tissue scrotal and perineal abscess  . KNEE ARTHROSCOPY Right 02/05/2015   Procedure: ARTHROSCOPIC [facility] OUT RIGHT KNEE;  Surgeon: Sheral Apley, MD;  [facility];  Service: Orthopedics;  Laterality: Right;  . LAPAROSCOPIC DIVERTED COLOSTOMY  03/27/2012   Procedure: LAPAROSCOPIC DIVERTED COLOSTOMY;  Surgeon: Currie Paris, MD;  Location: [facility] OR;  Service: General;  Laterality: N/A;        Home Medications    Prior to Admission medications   Medication Sig Start Date End Date Taking? Authorizing Provider  abacavir-dolutegravir-lamiVUDine (TRIUMEQ) 600-50-300 MG tablet Take 1 tablet by mouth daily. 10/14/17  Yes Zadie Rhine, MD  amLODipine (NORVASC) 10 MG tablet Take 1 tablet (10 mg total) by mouth daily. 10/14/17  Yes Zadie Rhine, MD  lisinopril (PRINIVIL,ZESTRIL) 20 MG tablet Take 1 tablet (20 mg total) by mouth daily. 10/14/17  Yes Zadie Rhine, MD  ciprofloxacin (CIPRO) 500 MG tablet Take 1 tablet (500 mg total) by mouth 2 (two) times daily. One po bid x 7 days Patient not taking: Reported on 11/17/2017 10/25/17   Geoffery Lyons, MD  HYDROcodone-acetaminophen (NORCO) 5-325 MG tablet Take 1-2 tablets  by mouth every 6 (six) hours as needed. Patient not taking: Reported on 11/17/2017 10/25/17   Geoffery Lyonselo, Douglas, MD  metroNIDAZOLE (FLAGYL) 500 MG tablet Take 1 tablet (500 mg total) by mouth 3 (three) times daily. One po tid x 7 days Patient not taking: Reported on 11/17/2017 10/25/17   Geoffery Lyonselo, Douglas, MD  Ostomy Supplies POUCH MISC 1 Container by Does not apply route daily. 02/04/14   Servando SnareBurns, Alexa R, MD    Family History Family History  Problem Relation Age of Onset  .  Hypertension Mother   . Diabetes Mother     Social History Social History   Tobacco Use  . Smoking status: Light Tobacco Smoker    Packs/day: 0.25    Years: 15.00    Pack years: 3.75    Types: Cigarettes  . Smokeless tobacco: Never Used  . Tobacco comment:  5CIGARETTES A DAY  Substance Use Topics  . Alcohol use: Yes    Alcohol/week: 0.0 standard drinks    Comment: rarely.  . Drug use: Not Currently    Types: Marijuana, Cocaine     Allergies   Patient has no known allergies.   Review of Systems Review of Systems  All other systems reviewed and are negative.    Physical Exam Updated Vital Signs BP (!) 178/117   Pulse (!) 101   Temp 98 F (36.7 C) (Oral)   Resp 19   Ht 6\' 1"  (1.854 m)   Wt 104.3 kg   SpO2 99%   BMI 30.34 kg/m   Physical Exam  Constitutional: He is oriented to person, place, and time. He appears well-developed and well-nourished.  HENT:  Head: Normocephalic and atraumatic.  Eyes: Conjunctivae are normal.  Neck: Neck supple.  Cardiovascular: Normal rate and regular rhythm.  Pulmonary/Chest: Effort normal and breath sounds normal.  Abdominal: Soft. Bowel sounds are normal.  Colostomy bag left lower quadrant.  Minimal irritation at the ostomy site.  Musculoskeletal: Normal range of motion.  Neurological: He is alert and oriented to person, place, and time.  Skin: Skin is warm and dry.  Psychiatric: He has a normal mood and affect. His behavior is normal.  Nursing note and vitals reviewed.    ED Treatments / Results  Labs (all labs ordered are listed, but only abnormal results are displayed) Labs Reviewed  COMPREHENSIVE METABOLIC PANEL - Abnormal; Notable for the following components:      Result Value   Potassium 3.4 (*)    Glucose, Bld 132 (*)    Total Protein 8.7 (*)    AST 76 (*)    All other components within normal limits  CBC - Abnormal; Notable for the following components:   Platelets 111 (*)    All other components  within normal limits  OCCULT BLOOD X 1 CARD TO LAB, STOOL - Abnormal; Notable for the following components:   Fecal Occult Bld POSITIVE (*)    All other components within normal limits  POC OCCULT BLOOD, ED  TYPE AND SCREEN  ABO/RH    EKG None  Radiology No results found.  Procedures Procedures (including critical care time)  Medications Ordered in ED Medications - No data to display   Initial Impression / Assessment and Plan / ED Course  I have reviewed the triage vital signs and the nursing notes.  Pertinent labs & imaging results that were available during my care of the patient were reviewed by me and considered in my medical decision making (see chart for details).  Patient presents with concerns about his colostomy site and lack of colostomy supplies.  No acute abdomen on physical exam.  I involved the registered nurse and social work in an attempt to get some replacement bags.  We will also refill his lisinopril 20 mg and amlodipine 10 mg for blood pressure control.  Final Clinical Impressions(s) / ED Diagnoses   Final diagnoses:  Colostomy complication [facility](HCC)    ED Discharge Orders    None       Donnetta Hutchingook, Daivd Fredericksen, MD 11/17/17 1725

## 2017-11-17 NOTE — ED Triage Notes (Signed)
Patient reports that he has a colostomy and has been seeing bright red blood in the stool and has been having abdominal pain below his stoma x 2 weeks. Patient hypertensive in triage. Patient states he took his BP meds today.

## 2017-11-17 NOTE — ED Notes (Signed)
Pt given a new colostomy to change out, as well as 3 to take home until he gets his delivered.

## 2017-11-17 NOTE — Progress Notes (Addendum)
[facility] about [facility]. CSW following up now.   [facility] Transferred to pt's room, pt did not pick up.   CSW and RNCM spoke with pt over the pt's phone. Pt informed CSW and RNCM that his bags have not been delivered for this month. Pt reported that for the past 3 years his bags have been delivered on the first of the month every month. Discussed with pt calling his supplier to report that the bags for this month have not been delivered. Pt unable to remember what the supplier name is right now, but stated he has the number at home to call. Pt agreeable to this. Pt reports he receives 30 bags in each shipment, insurance will not cover more. Requested pt's nurse ask for 4 additional bags to send home with pt to last until pt's supplier can ship him new bags.   CSW signing off.   Montine CircleKelsy Kariel Skillman, Silverio LayLCSWA [place] [facility]  [phone]

## 2017-11-17 NOTE — Discharge Instructions (Addendum)
You must be proactive and call the colostomy company if you do not get your delivery.  Try 1% hydrocortisone ointment to the ostomy for irritation symptoms.  [facility]  Refill for your blood pressure medication.  Strongly recommend primary care follow-up to monitor your blood pressure.

## 2017-11-17 NOTE — ED Notes (Signed)
Called social work, and they talked with pt and they stated he said his have not been delivered

## 2017-11-19 LAB — ABO/RH: ABO/RH(D): O POS

## 2018-01-25 DIAGNOSIS — Z933 Colostomy status: Secondary | ICD-10-CM | POA: Diagnosis not present

## 2018-01-25 DIAGNOSIS — I10 Essential (primary) hypertension: Secondary | ICD-10-CM | POA: Diagnosis not present

## 2018-01-25 DIAGNOSIS — H7290 Unspecified perforation of tympanic membrane, unspecified ear: Secondary | ICD-10-CM | POA: Diagnosis not present

## 2018-01-25 DIAGNOSIS — B20 Human immunodeficiency virus [HIV] disease: Secondary | ICD-10-CM | POA: Diagnosis not present

## 2018-02-16 ENCOUNTER — Other Ambulatory Visit: Payer: Medicare Other

## 2018-02-16 ENCOUNTER — Ambulatory Visit: Payer: Medicare Other

## 2018-02-20 ENCOUNTER — Other Ambulatory Visit: Payer: Medicare Other

## 2018-02-20 ENCOUNTER — Ambulatory Visit: Payer: Medicare Other

## 2018-02-20 ENCOUNTER — Other Ambulatory Visit: Payer: Self-pay | Admitting: *Deleted

## 2018-02-20 ENCOUNTER — Other Ambulatory Visit (HOSPITAL_COMMUNITY)
Admission: RE | Admit: 2018-02-20 | Discharge: 2018-02-20 | Disposition: A | Discharge status: Home / Self Care | Payer: Medicare Other | Source: Ambulatory Visit | Attending: Family | Admitting: Family

## 2018-02-20 DIAGNOSIS — B20 Human immunodeficiency virus [HIV] disease: Secondary | ICD-10-CM | POA: Diagnosis not present

## 2018-02-20 DIAGNOSIS — Z79899 Other long term (current) drug therapy: Secondary | ICD-10-CM

## 2018-02-20 DIAGNOSIS — Z113 Encounter for screening for infections with a predominantly sexual mode of transmission: Secondary | ICD-10-CM

## 2018-02-21 LAB — URINE CYTOLOGY ANCILLARY ONLY
CHLAMYDIA, DNA PROBE: NEGATIVE
NEISSERIA GONORRHEA: NEGATIVE

## 2018-02-21 LAB — T-HELPER CELL (CD4) - (RCID CLINIC ONLY)
CD4 T CELL ABS: 490 /uL (ref 400–2700)
CD4 T CELL HELPER: 31 % — AB (ref 33–55)

## 2018-02-22 LAB — CBC WITH DIFFERENTIAL/PLATELET
Basophils Absolute: 10 cells/uL (ref 0–200)
Basophils Relative: 0.3 %
EOS PCT: 1.2 %
Eosinophils Absolute: 40 cells/uL (ref 15–500)
HEMATOCRIT: 42.1 % (ref 38.5–50.0)
HEMOGLOBIN: 14.6 g/dL (ref 13.2–17.1)
LYMPHS ABS: 1591 {cells}/uL (ref 850–3900)
MCH: 33 pg (ref 27.0–33.0)
MCHC: 34.7 g/dL (ref 32.0–36.0)
MCV: 95 fL (ref 80.0–100.0)
MONOS PCT: 12.3 %
MPV: 9.2 fL (ref 7.5–12.5)
NEUTROS ABS: 1254 {cells}/uL — AB (ref 1500–7800)
Neutrophils Relative %: 38 %
Platelets: 254 10*3/uL (ref 140–400)
RBC: 4.43 10*6/uL (ref 4.20–5.80)
RDW: 13.1 % (ref 11.0–15.0)
Total Lymphocyte: 48.2 %
WBC mixed population: 406 cells/uL (ref 200–950)
WBC: 3.3 10*3/uL — AB (ref 3.8–10.8)

## 2018-02-22 LAB — COMPLETE METABOLIC PANEL WITH GFR
AG Ratio: 1.2 (calc) (ref 1.0–2.5)
ALBUMIN MSPROF: 3.8 g/dL (ref 3.6–5.1)
ALKALINE PHOSPHATASE (APISO): 60 U/L (ref 40–115)
ALT: 13 U/L (ref 9–46)
AST: 18 U/L (ref 10–35)
BUN: 12 mg/dL (ref 7–25)
CO2: 27 mmol/L (ref 20–32)
CREATININE: 0.81 mg/dL (ref 0.70–1.33)
Calcium: 8.9 mg/dL (ref 8.6–10.3)
Chloride: 105 mmol/L (ref 98–110)
GFR, Est African American: 120 mL/min/{1.73_m2} (ref >=60)
GFR, Est Non African American: 104 mL/min/{1.73_m2} (ref >=60)
GLOBULIN: 3.2 g/dL (ref 1.9–3.7)
GLUCOSE: 83 mg/dL (ref 65–99)
Potassium: 3.9 mmol/L (ref 3.5–5.3)
SODIUM: 142 mmol/L (ref 135–146)
Total Bilirubin: 0.2 mg/dL (ref 0.2–1.2)
Total Protein: 7 g/dL (ref 6.1–8.1)

## 2018-02-22 LAB — RPR: RPR Ser Ql: NONREACTIVE

## 2018-02-22 LAB — HIV-1 RNA QUANT-NO REFLEX-BLD
HIV 1 RNA Quant: 2510 copies/mL — ABNORMAL HIGH
HIV-1 RNA QUANT, LOG: 3.4 {Log_copies}/mL — AB

## 2018-02-22 LAB — LIPID PANEL
CHOL/HDL RATIO: 3.4 (calc) (ref <5.0)
CHOLESTEROL: 153 mg/dL (ref <200)
HDL: 45 mg/dL (ref >40)
LDL CHOLESTEROL (CALC): 82 mg/dL
Non-HDL Cholesterol (Calc): 108 mg/dL (calc) (ref <130)
Triglycerides: 156 mg/dL — ABNORMAL HIGH (ref <150)

## 2018-03-06 ENCOUNTER — Ambulatory Visit: Payer: Medicare Other

## 2018-03-06 ENCOUNTER — Encounter: Payer: Medicare Other | Admitting: Family

## 2018-03-06 NOTE — Progress Notes (Deleted)
Subjective:    Patient ID: Leon Nichols, male    DOB: February 04, 1966, 52 y.o.   MRN: [id number]  No chief complaint on file.   HPI:  Leon Nichols is a 52 y.o. male who presents today to re-establish care for his HIV disease.   Leon Nichols was last seen in our clinic in January 2016 with an antiretroviral regimen of Tivicay and Epzicom which was changed to Triumeq on 02/26/15. He had a viral load of 143 (Sept 2015) and CD4 count of 143.  He was newly diagnosed with HIV/AIDs in December 2013 with risk factors including multiple male partners and prior cocaine usage. His initial viral load was 61,734 and CD4 count of 170. He had a genotype showing M36I with no relevant resistance. Last seen in the ED in August of 2019 with concern for his left lower quadrant colostomy with a leak in the bag. He was also noted to be out of blood pressure medication with a reading of 178/117.   Leon Nichols most recently completed blood work on 02/20/18 with a viral load of 2,510 and CD4 count of 490. Negative for gonorrhea, syphilis or chlamydia. Kidney function, liver function and electrolytes were normal. No hepatitis screening was performed at this time. Health maintenance due includes Prevnar, Menveo, influenza vaccination and dental screening.     No Known Allergies    Outpatient Medications Prior to Visit  Medication Sig Dispense Refill  . abacavir-dolutegravir-lamiVUDine (TRIUMEQ) 600-50-300 MG tablet Take 1 tablet by mouth daily. 30 tablet 0  . amLODipine (NORVASC) 10 MG tablet Take 1 tablet (10 mg total) by mouth daily. 30 tablet 0  . amLODipine (NORVASC) 10 MG tablet Take 1 tablet (10 mg total) by mouth daily. 30 tablet 1  . ciprofloxacin (CIPRO) 500 MG tablet Take 1 tablet (500 mg total) by mouth 2 (two) times daily. One po bid x 7 days (Patient not taking: Reported on 11/17/2017) 14 tablet 0  . HYDROcodone-acetaminophen (NORCO) 5-325 MG tablet Take 1-2 tablets by mouth every 6 (six) hours as needed.  (Patient not taking: Reported on 11/17/2017) 15 tablet 0  . lisinopril (PRINIVIL,ZESTRIL) 20 MG tablet Take 1 tablet (20 mg total) by mouth daily. 30 tablet 0  . lisinopril (PRINIVIL,ZESTRIL) 20 MG tablet Take 1 tablet (20 mg total) by mouth daily. 30 tablet 1  . metroNIDAZOLE (FLAGYL) 500 MG tablet Take 1 tablet (500 mg total) by mouth 3 (three) times daily. One po tid x 7 days (Patient not taking: Reported on 11/17/2017) 21 tablet 0  . Ostomy Supplies POUCH MISC 1 Container by Does not apply route daily. 30 each 11   No facility-administered medications prior to visit.      Past Medical History:  Diagnosis Date  . Colitis   . Diverticulosis   . Hemorrhoids   . History of shingles   . HIV (human immunodeficiency virus infection) (HCC) 03/24/2012  . Hypertension   . Perirectal abscess       Past Surgical History:  Procedure Laterality Date  . arthroscopic wash out of right knee    . COLONOSCOPY  08/31/2011   Procedure: COLONOSCOPY;  Surgeon: Hilarie FredricksonJohn N Perry, MD;  [facility] END[facility];  Service: Endoscopy;  Laterality: N/A;  . DRESSING CHANGE UNDER ANESTHESIA  03/27/2012   Procedure: DRESSING CHANGE UNDER ANESTHESIA;  Surgeon: Currie Parishristian J Streck, MD;  Location: [facility] OR;  Service: General;  Laterality: N/A;  . INCISION AND DRAINAGE PERIRECTAL ABSCESS  03/23/2012   Procedure: IRRIGATION AND  DEBRIDEMENT PERIRECTAL ABSCESS;  Surgeon: Mariella Saa, MD;  Location: [facility] OR;  Service: General;  Laterality: N/A;  I & D soft tissue scrotal and perineal abscess  . KNEE ARTHROSCOPY Right 02/05/2015   Procedure: ARTHROSCOPIC [facility] OUT RIGHT KNEE;  Surgeon: Sheral Apley, MD;  Location: [facility] OR;  Service: Orthopedics;  Laterality: Right;  . LAPAROSCOPIC DIVERTED COLOSTOMY  03/27/2012   Procedure: LAPAROSCOPIC DIVERTED COLOSTOMY;  Surgeon: Currie Paris, MD;  Location: [facility] OR;  Service: General;  Laterality: N/A;      Family History  Problem Relation Age of Onset  . Hypertension Mother     . Diabetes Mother       Social History   Socioeconomic History  . Marital status: Single    Spouse name: Not on file  . Number of children: Not on file  . Years of education: Not on file  . Highest education level: Not on file  Occupational History  . Not on file  Social Needs  . Financial resource strain: Not on file  . Food insecurity:    Worry: Not on file    Inability: Not on file  . Transportation needs:    Medical: Not on file    Non-medical: Not on file  Tobacco Use  . Smoking status: Light Tobacco Smoker    Packs/day: 0.25    Years: 15.00    Pack years: 3.75    Types: Cigarettes  . Smokeless tobacco: Never Used  . Tobacco comment:  5CIGARETTES A DAY  Substance and Sexual Activity  . Alcohol use: Yes    Alcohol/week: 0.0 standard drinks    Comment: rarely.  . Drug use: Not Currently    Types: Marijuana, Cocaine  . Sexual activity: Not on file  Lifestyle  . Physical activity:    Days per week: Not on file    Minutes per session: Not on file  . Stress: Not on file  Relationships  . Social connections:    Talks on phone: Not on file    Gets together: Not on file    Attends religious service: Not on file    Active member of club or organization: Not on file    Attends meetings of clubs or organizations: Not on file    Relationship status: Not on file  . Intimate partner violence:    Fear of current or ex partner: Not on file    Emotionally abused: Not on file    Physically abused: Not on file    Forced sexual activity: Not on file  Other Topics Concern  . Not on file  Social History Narrative  . Not on file      Review of Systems     Objective:    There were no vitals taken for this visit. Nursing note and vital signs reviewed.  Physical Exam      Assessment & Plan:   Problem List Items Addressed This Visit    None       I am having Leon Nichols maintain his [employer], abacavir-dolutegravir-lamiVUDine, amLODipine,  lisinopril, ciprofloxacin, metroNIDAZOLE, HYDROcodone-acetaminophen, amLODipine, and lisinopril.   No orders of the defined types were placed in this encounter.    Follow-up: No follow-ups on file.    Marcos Eke, MSN, FNP-C Nurse Practitioner [facility] for Infectious Disease [facility] Office phone: [phone] Pager: [phone] [facility] Main number: [phone]

## 2018-03-20 ENCOUNTER — Ambulatory Visit: Payer: Medicare Other

## 2018-03-20 ENCOUNTER — Ambulatory Visit: Payer: Medicare Other | Admitting: Internal Medicine

## 2018-04-13 ENCOUNTER — Ambulatory Visit: Payer: Medicare Other | Admitting: Internal Medicine

## 2018-04-13 ENCOUNTER — Ambulatory Visit: Payer: Medicare Other

## 2018-04-18 ENCOUNTER — Ambulatory Visit: Payer: Medicare Other | Admitting: Internal Medicine

## 2018-05-10 ENCOUNTER — Ambulatory Visit: Payer: Medicare Other | Admitting: Internal Medicine

## 2018-05-24 ENCOUNTER — Ambulatory Visit: Payer: Medicare Other | Admitting: Family

## 2018-05-24 ENCOUNTER — Ambulatory Visit: Payer: Medicare Other

## 2018-05-24 ENCOUNTER — Ambulatory Visit: Payer: Medicare Other | Admitting: Pharmacist

## 2018-06-05 ENCOUNTER — Ambulatory Visit: Payer: Medicare Other

## 2018-06-05 ENCOUNTER — Other Ambulatory Visit (HOSPITAL_COMMUNITY)
Admission: RE | Admit: 2018-06-05 | Discharge: 2018-06-05 | Disposition: A | Discharge status: Home / Self Care | Payer: Medicare Other | Source: Ambulatory Visit | Attending: Internal Medicine | Admitting: Internal Medicine

## 2018-06-05 ENCOUNTER — Other Ambulatory Visit: Payer: Self-pay | Admitting: *Deleted

## 2018-06-05 ENCOUNTER — Other Ambulatory Visit: Payer: Medicare Other

## 2018-06-05 DIAGNOSIS — Z113 Encounter for screening for infections with a predominantly sexual mode of transmission: Secondary | ICD-10-CM | POA: Insufficient documentation

## 2018-06-05 DIAGNOSIS — B20 Human immunodeficiency virus [HIV] disease: Secondary | ICD-10-CM

## 2018-06-05 DIAGNOSIS — Z79899 Other long term (current) drug therapy: Secondary | ICD-10-CM

## 2018-06-05 DIAGNOSIS — A539 Syphilis, unspecified: Secondary | ICD-10-CM

## 2018-06-06 LAB — T-HELPER CELL (CD4) - (RCID CLINIC ONLY)
CD4 T CELL HELPER: 28 % — AB (ref 33–55)
CD4 T Cell Abs: 470 /uL (ref 400–2700)

## 2018-06-06 LAB — URINE CYTOLOGY ANCILLARY ONLY
CHLAMYDIA, DNA PROBE: NEGATIVE
NEISSERIA GONORRHEA: NEGATIVE

## 2018-06-13 LAB — CBC WITH DIFFERENTIAL/PLATELET
Absolute Monocytes: 282 cells/uL (ref 200–950)
BASOS ABS: 10 {cells}/uL (ref 0–200)
Basophils Relative: 0.3 %
EOS ABS: 10 {cells}/uL — AB (ref 15–500)
EOS PCT: 0.3 %
HCT: 43.1 % (ref 38.5–50.0)
HEMOGLOBIN: 15 g/dL (ref 13.2–17.1)
Lymphs Abs: 1581 cells/uL (ref 850–3900)
MCH: 30.5 pg (ref 27.0–33.0)
MCHC: 34.8 g/dL (ref 32.0–36.0)
MCV: 87.8 fL (ref 80.0–100.0)
MPV: 10.7 fL (ref 7.5–12.5)
Monocytes Relative: 8.8 %
NEUTROS ABS: 1318 {cells}/uL — AB (ref 1500–7800)
NEUTROS PCT: 41.2 %
PLATELETS: 152 10*3/uL (ref 140–400)
RBC: 4.91 10*6/uL (ref 4.20–5.80)
RDW: 12.7 % (ref 11.0–15.0)
TOTAL LYMPHOCYTE: 49.4 %
WBC: 3.2 10*3/uL — ABNORMAL LOW (ref 3.8–10.8)

## 2018-06-13 LAB — COMPLETE METABOLIC PANEL WITH GFR
AG RATIO: 1 (calc) (ref 1.0–2.5)
ALT: 11 U/L (ref 9–46)
AST: 26 U/L (ref 10–35)
Albumin: 3.9 g/dL (ref 3.6–5.1)
Alkaline phosphatase (APISO): 64 U/L (ref 35–144)
BILIRUBIN TOTAL: 0.3 mg/dL (ref 0.2–1.2)
BUN: 12 mg/dL (ref 7–25)
CHLORIDE: 98 mmol/L (ref 98–110)
CO2: 24 mmol/L (ref 20–32)
Calcium: 9.2 mg/dL (ref 8.6–10.3)
Creat: 0.99 mg/dL (ref 0.70–1.33)
GFR, EST AFRICAN AMERICAN: 102 mL/min/{1.73_m2} (ref >=60)
GFR, Est Non African American: 88 mL/min/{1.73_m2} (ref >=60)
GLOBULIN: 4.1 g/dL — AB (ref 1.9–3.7)
Glucose, Bld: 97 mg/dL (ref 65–99)
POTASSIUM: 4.1 mmol/L (ref 3.5–5.3)
SODIUM: 134 mmol/L — AB (ref 135–146)
TOTAL PROTEIN: 8 g/dL (ref 6.1–8.1)

## 2018-06-13 LAB — HIV-1 GENOTYPE: HIV-1 Genotype: DETECTED — AB

## 2018-06-13 LAB — LIPID PANEL
CHOL/HDL RATIO: 3.3 (calc) (ref <5.0)
Cholesterol: 160 mg/dL (ref <200)
HDL: 49 mg/dL (ref >=40)
LDL Cholesterol (Calc): 94 mg/dL (calc)
NON-HDL CHOLESTEROL (CALC): 111 mg/dL (ref <130)
Triglycerides: 79 mg/dL (ref <150)

## 2018-06-13 LAB — HIV-1 RNA ULTRAQUANT REFLEX TO GENTYP+
HIV 1 RNA Quant: 21800 copies/mL — ABNORMAL HIGH
HIV-1 RNA QUANT, LOG: 4.34 {Log_copies}/mL — AB

## 2018-06-13 LAB — RPR: RPR Ser Ql: NONREACTIVE

## 2018-06-22 ENCOUNTER — Ambulatory Visit: Payer: Medicare Other | Admitting: Pharmacist

## 2018-06-22 ENCOUNTER — Encounter: Payer: Medicare Other | Admitting: Internal Medicine

## 2018-06-29 ENCOUNTER — Other Ambulatory Visit: Payer: Self-pay

## 2018-06-29 ENCOUNTER — Encounter: Payer: Self-pay | Admitting: Family

## 2018-06-29 ENCOUNTER — Ambulatory Visit (INDEPENDENT_AMBULATORY_CARE_PROVIDER_SITE_OTHER): Payer: Medicare Other | Admitting: Pharmacist

## 2018-06-29 ENCOUNTER — Ambulatory Visit (INDEPENDENT_AMBULATORY_CARE_PROVIDER_SITE_OTHER): Payer: Medicare Other | Admitting: Family

## 2018-06-29 ENCOUNTER — Telehealth: Payer: Self-pay | Admitting: Pharmacy Technician

## 2018-06-29 VITALS — BP 172/112 | HR 92 | Temp 98.3°F | Wt 221.0 lb

## 2018-06-29 DIAGNOSIS — B20 Human immunodeficiency virus [HIV] disease: Secondary | ICD-10-CM | POA: Diagnosis not present

## 2018-06-29 DIAGNOSIS — H6062 Unspecified chronic otitis externa, left ear: Secondary | ICD-10-CM | POA: Diagnosis not present

## 2018-06-29 MED ORDER — ABACAVIR-DOLUTEGRAVIR-LAMIVUD 600-50-300 MG PO TABS: 1.0000 | ORAL_TABLET | Freq: Every day | ORAL | 2 refills | Status: DC

## 2018-06-29 MED ORDER — NEOMYCIN-COLIST-HC-THONZONIUM 3.3-3-10-0.5 MG/ML OT SUSP: 3.0000 [drp] | Freq: Four times a day (QID) | OTIC | 0 refills | Status: DC

## 2018-06-29 NOTE — Progress Notes (Signed)
HPI: Leon Nichols is a 53 y.o. male who presents to the [facility] clinic today to re-establish care with Tammy Sours and myself after a long absence.   Patient Active Problem List   Diagnosis Date Noted  . Acute back pain 10/28/2016  . Headache 10/15/2015  . Perforated ear drum, left 10/15/2015  . Perianal abscess 02/06/2015  . Septic arthritis (HCC) 02/05/2015  . 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 Neuralgia, post-herpetic 01/02/2014  . HIV (human immunodeficiency virus infection) (HCC) 03/24/2012  . Anemia 03/23/2012  . Essential hypertension 03/29/2007    Patient's Medications  New Prescriptions   NEOMYCIN-COLISTIN-HYDROCORTISONE-THONZONIUM (CORTISPORIN-TC) 3.06-05-08-0.5 MG/ML OTIC SUSPENSION    Place 3 drops into the left ear 4 (four) times daily.  Previous Medications   AMLODIPINE (NORVASC) 10 MG TABLET    Take 1 tablet (10 mg total) by mouth daily.   LISINOPRIL (PRINIVIL,ZESTRIL) 20 MG TABLET    Take 1 tablet (20 mg total) by mouth daily.  Modified Medications   Modified Medication Previous Medication   ABACAVIR-DOLUTEGRAVIR-LAMIVUDINE (TRIUMEQ) 600-50-300 MG TABLET abacavir-dolutegravir-lamiVUDine (TRIUMEQ) 600-50-300 MG tablet      Take 1 tablet by mouth daily.    Take 1 tablet by mouth daily.  Discontinued Medications   No medications on file    Allergies: No Known Allergies  Past Medical History: Past Medical History:  Diagnosis Date  . Colitis   . Diverticulosis   . Hemorrhoids   . History of shingles   . HIV (human immunodeficiency virus infection) (HCC) 03/24/2012  . Hypertension   . Perirectal abscess     Social History: Social History   Socioeconomic History  . Marital status: Single    Spouse name: Not on file  . Number of children: Not on file  . Years of education: Not on file  . Highest education level: Not on file  Occupational History  . Not on file  Social Needs  . Financial resource strain: Not on file  .  Food insecurity:    Worry: Not on file    Inability: Not on file  . Transportation needs:    Medical: Not on file    Non-medical: Not on file  Tobacco Use  . Smoking status: Light Tobacco Smoker    Packs/day: 0.25    Years: 15.00    Pack years: 3.75    Types: Cigarettes  . Smokeless tobacco: Never Used  . Tobacco comment:  5CIGARETTES A DAY  Substance and Sexual Activity  . Alcohol use: Yes    Alcohol/week: 0.0 standard drinks    Comment: rarely.  . Drug use: Not Currently    Types: Marijuana, Cocaine  . Sexual activity: Not on file  Lifestyle  . Physical activity:    Days per week: Not on file    Minutes per session: Not on file  . Stress: Not on file  Relationships  . Social connections:    Talks on phone: Not on file    Gets together: Not on file    Attends religious service: Not on file    Active member of club or organization: Not on file    Attends meetings of clubs or organizations: Not on file    Relationship status: Not on file  Other Topics Concern  . Not on file  Social History Narrative  . Not on file    Labs: Lab Results  Component Value Date   HIV1RNAQUANT 21,800 (H) 06/05/2018   HIV1RNAQUANT 2,510 (H) 02/20/2018  HIV1RNAQUANT <20 02/06/2015   CD4TABS 470 06/05/2018   CD4TABS 490 02/20/2018   CD4TABS 470 02/06/2015    RPR and STI Lab Results  Component Value Date   LABRPR NON-REACTIVE 06/05/2018   LABRPR NON-REACTIVE 02/20/2018   LABRPR Non Reactive 02/07/2015   LABRPR NON REAC 11/08/2013   LABRPR NON REACTIVE 03/23/2012    STI Results GC GC CT CT  Latest Ref Rng & Units - NEGATIVE - NEGATIVE  06/05/2018 Negative - Negative -  02/20/2018 Negative - Negative -  02/06/2015 Negative - Negative -  03/23/2012 - NEGATIVE - NEGATIVE    Hepatitis B Lab Results  Component Value Date   HEPBSAG NEGATIVE 03/23/2012   Hepatitis C No results found for: HEPCAB, HCVRNAPCRQN Hepatitis A Lab Results  Component Value Date   HAV NEG 07/27/2012    Lipids: Lab Results  Component Value Date   CHOL 160 06/05/2018   TRIG 79 06/05/2018   HDL 49 06/05/2018   CHOLHDL 3.3 06/05/2018   VLDL 13 02/07/2015   LDLCALC 94 06/05/2018    Current HIV Regimen: Triumeq  Assessment: [name] is here today to be seen for his HIV infection.  He was last seen in clinic in 2016 and has been out of care since then.  He states he stopped taking his Triumeq about a year ago.  I'm not sure where he was getting refills prior to that. His last HIV viral load was 21,800 on 06/05/2018. He is insured and willing to restart medications. Will continue Triumeq for now.  No issues, questions, or concerns.  I gave him my card and told him to call me if he has any issues with his medications or issues paying for it.   Plan: - Restart Triumeq PO once daily - F/u with Tammy Sours 5/11 at 915am   L. , PharmD, BCIDP, AAHIVP, CPP Infectious Diseases Clinical Pharmacist [facility] 06/29/2018, 4:08 PM

## 2018-06-29 NOTE — Telephone Encounter (Signed)
[facility] Patient Product/process development scientist completed.    The patient in insured through [facility] and has a $3.90 copay.  Leon Nichols. Leon Nichols CPhT Specialty Pharmacy Patient [facility] for Infectious Disease Phone: [phone] Fax:  [phone]

## 2018-06-29 NOTE — Progress Notes (Signed)
Subjective:    Patient ID: Leon Nichols, male    DOB: [birth year], 53 y.o.   MRN: [id number]  Chief Complaint  Patient presents with  . New Patient (Initial Visit)    RETURNING TO CARE    HPI:  Leon Nichols is a 53 y.o. male who presents today to re-establish care for HIV disease.  Mr. Dewoody was last seen in the office on 04/25/2014 with good adherence and tolerance to his ART regimen of Tivicay and Epzicom.  Blood work at the time with a viral load of 46 with CD4 count of 770.  Most recent blood work completed on 06/05/2018 with a viral load of 21,800 and CD4 count of 470.  Genotype detected showed no significant mutations with wild-type virus which remains consistent with previous genotype performed.  RPR was negative for syphilis with gonorrhea and Chlamydia testing also being negative.  Kidney function, liver function, electrolytes were all within normal ranges. Following his las office visit he was changed to [employer].   Mr. Weigman stopped taking his Triumeq along with all of his other medications about 1 year ago because he did not want to take them anymore. He is here today to get restarted on his medications. He feels well today with no symptoms of opportunistic infection. Denies fevers, chills, night sweats, headaches, changes in vision, neck pain/stiffness, nausea, diarrhea, vomiting, lesions or rashes.  Mr. 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 Resides with his brother currently and awaiting assisted living housing that will become available. Denies recreational or illicit drug use with previous history of cocaine and marijuana.Currently a tobacco smoker averaging about 5 cigarettes per day.  Denies feeling down, depressed or hopeless over the last 2 weeks. He is due for a dental screen and colon cancer screening. Not currently sexually active and declines condoms.    No Known Allergies     Outpatient Medications Prior to Visit  Medication Sig Dispense Refill  . amLODipine (NORVASC) 10 MG tablet Take 1 tablet (10 mg total) by mouth daily. 30 tablet 1  . lisinopril (PRINIVIL,ZESTRIL) 20 MG tablet Take 1 tablet (20 mg total) by mouth daily. 30 tablet 0  . abacavir-dolutegravir-lamiVUDine (TRIUMEQ) 600-50-300 MG tablet Take 1 tablet by mouth daily. (Patient not taking: Reported on 06/29/2018) 30 tablet 0  . amLODipine (NORVASC) 10 MG tablet Take 1 tablet (10 mg total) by mouth daily. (Patient not taking: Reported on 06/29/2018) 30 tablet 0  . ciprofloxacin (CIPRO) 500 MG tablet Take 1 tablet (500 mg total) by mouth 2 (two) times daily. One po bid x 7 days (Patient not taking: Reported on 11/17/2017) 14 tablet 0  . HYDROcodone-acetaminophen (NORCO) 5-325 MG tablet Take 1-2 tablets by mouth every 6 (six) hours as needed. (Patient not taking: Reported on 11/17/2017) 15 tablet 0  . lisinopril (PRINIVIL,ZESTRIL) 20 MG tablet Take 1 tablet (20 mg total) by mouth daily. (Patient not taking: Reported on 06/29/2018) 30 tablet 1  . metroNIDAZOLE (FLAGYL) 500 MG tablet Take 1 tablet (500 mg total) by mouth 3 (three) times daily. One po tid x 7 days (Patient not taking: Reported on 11/17/2017) 21 tablet 0  . Ostomy Supplies POUCH MISC 1 Container by Does not apply route daily. (Patient not taking: Reported on 06/29/2018) 30 each 11   No facility-administered medications prior to visit.      Past Medical History:  Diagnosis Date  . Acute back pain 10/28/2016  .  Colitis   . Diverticulosis   . Hemorrhoids   . History of shingles   . HIV (human immunodeficiency virus infection) (HCC) 03/24/2012  . Hypertension   . Perianal abscess 02/06/2015  . Perirectal abscess   . Septic arthritis (HCC) 02/05/2015      Past Surgical History:  Procedure Laterality Date  . arthroscopic wash out of right knee    . COLONOSCOPY  08/31/2011   Procedure: COLONOSCOPY;  Surgeon: Hilarie Fredrickson, MD;  Location: [facility]  ENDOSCOPY;  Service: Endoscopy;  Laterality: N/A;  . DRESSING CHANGE UNDER ANESTHESIA  03/27/2012   Procedure: DRESSING CHANGE UNDER ANESTHESIA;  Surgeon: Currie Paris, MD;  Location: [facility] OR;  Service: General;  Laterality: N/A;  . INCISION AND DRAINAGE PERIRECTAL ABSCESS  03/23/2012   Procedure: IRRIGATION AND DEBRIDEMENT PERIRECTAL ABSCESS;  Surgeon: Mariella Saa, MD;  Location: [facility] OR;  Service: General;  Laterality: N/A;  I & D soft tissue scrotal and perineal abscess  . KNEE ARTHROSCOPY Right 02/05/2015   Procedure: ARTHROSCOPIC [facility] OUT RIGHT KNEE;  Surgeon: Sheral Apley, MD;  Location[facility] OR;  Service: Orthopedics;  Laterality: Right;  . LAPAROSCOPIC DIVERTED COLOSTOMY  03/27/2012   Procedure: LAPAROSCOPIC DIVERTED COLOSTOMY;  Surgeon: Currie Paris, MD;  Location: [facility] OR;  Service: General;  Laterality: N/A;      Family History  Problem Relation Age of Onset  . Hypertension Mother   . Diabetes Mother       Social History   Socioeconomic History  . Marital status: Single    Spouse name: Not on file  . Number of children: Not on file  . Years of education: Not on file  . Highest education level: Not on file  Occupational History  . Not on file  Social Needs  . Financial resource strain: Not on file  . Food insecurity:    Worry: Not on file    Inability: Not on file  . Transportation needs:    Medical: Not on file    Non-medical: Not on file  Tobacco Use  . Smoking status: Light Tobacco Smoker    Packs/day: 0.25    Years: 15.00    Pack years: 3.75    Types: Cigarettes  . Smokeless tobacco: Never Used  . Tobacco comment:  5CIGARETTES A DAY  Substance and Sexual Activity  . Alcohol use: Yes    Alcohol/week: 0.0 standard drinks    Comment: rarely.  . Drug use: Not Currently    Types: Marijuana, Cocaine  . Sexual activity: Not on file  Lifestyle  . Physical activity:    Days per week: Not on file    Minutes per session: Not on file  .  Stress: Not on file  Relationships  . Social connections:    Talks on phone: Not on file    Gets together: Not on file    Attends religious service: Not on file    Active member of club or organization: Not on file    Attends meetings of clubs or organizations: Not on file    Relationship status: Not on file  . Intimate partner violence:    Fear of current or ex partner: Not on file    Emotionally abused: Not on file    Physically abused: Not on file    Forced sexual activity: Not on file  Other Topics Concern  . Not on file  Social History Narrative  . Not on file      Review of  Systems  Constitutional: Negative for appetite change, chills, fatigue, fever and unexpected weight change.  Eyes: Negative for visual disturbance.  Respiratory: Negative for cough, chest tightness, shortness of breath and wheezing.   Cardiovascular: Negative for chest pain and leg swelling.  Gastrointestinal: Negative for abdominal pain, constipation, diarrhea, nausea and vomiting.  Genitourinary: Negative for dysuria, flank pain, frequency, genital sores, hematuria and urgency.  Skin: Negative for rash.  Allergic/Immunologic: Negative for immunocompromised state.  Neurological: Negative for dizziness and headaches.       Objective:    BP (!) 172/112   Pulse 92   Temp 98.3 F (36.8 C) (Oral)   Wt 221 lb (100.2 kg)   BMI 29.16 kg/m  Nursing note and vital signs reviewed.  Physical Exam Constitutional:      General: He is not in acute distress.    Appearance: He is well-developed.  Eyes:     Conjunctiva/sclera: Conjunctivae normal.  Neck:     Musculoskeletal: Neck supple.  Cardiovascular:     Rate and Rhythm: Normal rate and regular rhythm.     Heart sounds: Normal heart sounds. No murmur. No friction rub. No gallop.   Pulmonary:     Effort: Pulmonary effort is normal. No respiratory distress.     Breath sounds: Normal breath sounds. No wheezing or rales.  Chest:     Chest wall: No  tenderness.  Abdominal:     General: Bowel sounds are normal.     Palpations: Abdomen is soft.     Tenderness: There is no abdominal tenderness.  Lymphadenopathy:     Cervical: No cervical adenopathy.  Skin:    General: Skin is warm and dry.     Findings: No rash.  Neurological:     Mental Status: He is alert and oriented to person, place, and time.  Psychiatric:        Mood and Affect: Mood normal.         Assessment & Plan:   Problem List Items Addressed This Visit      Nervous and Auditory   Chronic otitis externa of left ear    Mr. Ferger is having drainage and decreased hearing in left ear with previous history of ruptured tympanic membrane. Appears healed over. Will treat for otitis externa with recommended follow up with primary care or ENT.       Relevant Medications   neomycin-colistin-hydrocortisone-thonzonium (CORTISPORIN-TC) 3.06-05-08-0.5 MG/ML OTIC suspension     Other   HIV disease (HCC) - Primary    Mr. Schnepf is returning to care after 3 years and having been off medication for at least 1-2 years as he was feeling better and did not believe he needed the medication. Most recent blood work with CD4 count of 470 and viral load of 21,800. He has no signs/symptoms of opportunistic infection or progressive HIV disease at present. Genotype performed with continued wild-type virus. Discussed importance of taking medication as prescribed to reduce risk of progressive HIV disease as well as other complications including cardiovascular disease and renal disease. Will restart him on Triumeq. Plan for follow up in 1 month or sooner if needed with blood work at that appointment.       Relevant Medications   abacavir-dolutegravir-lamiVUDine (TRIUMEQ) 600-50-300 MG tablet       I have discontinued Dametrius H. Debois's [employer], ciprofloxacin, metroNIDAZOLE, and HYDROcodone-acetaminophen. I am also having him start on neomycin-colistin-hydrocortisone-thonzonium.  Additionally, I am having him maintain his lisinopril, amLODipine, and abacavir-dolutegravir-lamiVUDine.   Meds ordered  this encounter  Medications  . abacavir-dolutegravir-lamiVUDine (TRIUMEQ) 600-50-300 MG tablet    Sig: Take 1 tablet by mouth daily.    Dispense:  30 tablet    Refill:  2    Order Specific Question:   Supervising Provider    Answer:   [name] [[id number]]  . neomycin-colistin-hydrocortisone-thonzonium (CORTISPORIN-TC) 3.06-05-08-0.5 MG/ML OTIC suspension    Sig: Place 3 drops into the left ear 4 (four) times daily.    Dispense:  10 mL    Refill:  0    Order Specific Question:   Supervising Provider    Answer:   [name] [[id number]]     Follow-up: Return in about 1 month (around 07/30/2018), or if symptoms worsen or fail to improve.    Marcos Eke, MSN, FNP-C Nurse Practitioner [facility] for Infectious Disease [facility] Office phone: [phone] Pager: [phone] [facility] Main number: [phone]

## 2018-06-29 NOTE — Patient Instructions (Signed)
Nice to meet you.  Please restart taking your Triqumeq daily.  We will plan to follow up in 1 month or sooner if needed to recheck your lab work.   Cortisporin has been sent to the pharmacy to help with your left ear. Use the medication for about 7-10 days.

## 2018-06-30 ENCOUNTER — Encounter: Payer: Self-pay | Admitting: Family

## 2018-06-30 DIAGNOSIS — H6062 Unspecified chronic otitis externa, left ear: Secondary | ICD-10-CM | POA: Insufficient documentation

## 2018-06-30 NOTE — Assessment & Plan Note (Signed)
[name] is having drainage and decreased hearing in left ear with previous history of ruptured tympanic membrane. Appears healed over. Will treat for otitis externa with recommended follow up with primary care or ENT.

## 2018-06-30 NOTE — Assessment & Plan Note (Addendum)
Leon Nichols is returning to care after 3 years and having been off medication for at least 1-2 years as he was feeling better and did not believe he needed the medication. Most recent blood work with CD4 count of 470 and viral load of 21,800. He has no signs/symptoms of opportunistic infection or progressive HIV disease at present. Genotype performed with continued wild-type virus. Discussed importance of taking medication as prescribed to reduce risk of progressive HIV disease as well as other complications including cardiovascular disease and renal disease. Will restart him on Triumeq. Plan for follow up in 1 month or sooner if needed with blood work at that appointment.

## 2018-07-31 ENCOUNTER — Other Ambulatory Visit: Payer: Medicare Other

## 2018-08-14 ENCOUNTER — Encounter: Payer: Medicare Other | Admitting: Family

## 2019-01-02 ENCOUNTER — Other Ambulatory Visit: Payer: Self-pay

## 2019-01-02 ENCOUNTER — Telehealth: Payer: Self-pay

## 2019-01-02 DIAGNOSIS — B20 Human immunodeficiency virus [HIV] disease: Secondary | ICD-10-CM

## 2019-01-02 NOTE — Telephone Encounter (Signed)
Patient called office requesting refills on Triumeq. Patient claims to have been off medication for one month; last refill was sent in on 3/26 with two refills. Informed patient since he has been without meds for that long he would need to have labs done before he could restart. Will have patient come in on 9/30 for labs and office visit two weeks after with NP. [place]

## 2019-01-03 ENCOUNTER — Other Ambulatory Visit: Payer: Medicare Other

## 2019-01-09 ENCOUNTER — Other Ambulatory Visit: Payer: Medicare Other

## 2019-01-15 ENCOUNTER — Other Ambulatory Visit: Payer: Medicare Other

## 2019-01-17 ENCOUNTER — Ambulatory Visit: Payer: Medicare Other | Admitting: Infectious Diseases

## 2019-01-18 ENCOUNTER — Other Ambulatory Visit: Payer: Medicare Other

## 2019-01-18 ENCOUNTER — Other Ambulatory Visit: Payer: Self-pay

## 2019-01-18 DIAGNOSIS — B20 Human immunodeficiency virus [HIV] disease: Secondary | ICD-10-CM

## 2019-01-19 LAB — T-HELPER CELL (CD4) - (RCID CLINIC ONLY)
CD4 % Helper T Cell: 27 % — ABNORMAL LOW (ref 33–65)
CD4 T Cell Abs: 464 /uL (ref 400–1790)

## 2019-01-22 ENCOUNTER — Ambulatory Visit: Payer: Medicare Other | Admitting: Infectious Diseases

## 2019-01-22 LAB — CBC WITH DIFFERENTIAL/PLATELET
Absolute Monocytes: 520 cells/uL (ref 200–950)
Basophils Absolute: 9 cells/uL (ref 0–200)
Basophils Relative: 0.2 %
Eosinophils Absolute: 18 cells/uL (ref 15–500)
Eosinophils Relative: 0.4 %
HCT: 41 % (ref 38.5–50.0)
Hemoglobin: 14 g/dL (ref 13.2–17.1)
Lymphs Abs: 1872 cells/uL (ref 850–3900)
MCH: 32 pg (ref 27.0–33.0)
MCHC: 34.1 g/dL (ref 32.0–36.0)
MCV: 93.6 fL (ref 80.0–100.0)
MPV: 9.3 fL (ref 7.5–12.5)
Monocytes Relative: 11.3 %
Neutro Abs: 2180 cells/uL (ref 1500–7800)
Neutrophils Relative %: 47.4 %
Platelets: 222 10*3/uL (ref 140–400)
RBC: 4.38 10*6/uL (ref 4.20–5.80)
RDW: 13 % (ref 11.0–15.0)
Total Lymphocyte: 40.7 %
WBC: 4.6 10*3/uL (ref 3.8–10.8)

## 2019-01-22 LAB — HIV-1 RNA QUANT-NO REFLEX-BLD
HIV 1 RNA Quant: 14600 copies/mL — ABNORMAL HIGH
HIV-1 RNA Quant, Log: 4.16 Log copies/mL — ABNORMAL HIGH

## 2019-01-22 LAB — COMPLETE METABOLIC PANEL WITH GFR
AG Ratio: 0.9 (calc) — ABNORMAL LOW (ref 1.0–2.5)
ALT: 22 U/L (ref 9–46)
AST: 42 U/L — ABNORMAL HIGH (ref 10–35)
Albumin: 3.5 g/dL — ABNORMAL LOW (ref 3.6–5.1)
Alkaline phosphatase (APISO): 68 U/L (ref 35–144)
BUN: 11 mg/dL (ref 7–25)
CO2: 24 mmol/L (ref 20–32)
Calcium: 8.7 mg/dL (ref 8.6–10.3)
Chloride: 101 mmol/L (ref 98–110)
Creat: 0.85 mg/dL (ref 0.70–1.33)
GFR, Est African American: 117 mL/min/{1.73_m2} (ref >=60)
GFR, Est Non African American: 101 mL/min/{1.73_m2} (ref >=60)
Globulin: 3.7 g/dL (calc) (ref 1.9–3.7)
Glucose, Bld: 126 mg/dL — ABNORMAL HIGH (ref 65–99)
Potassium: 4.1 mmol/L (ref 3.5–5.3)
Sodium: 134 mmol/L — ABNORMAL LOW (ref 135–146)
Total Bilirubin: 0.2 mg/dL (ref 0.2–1.2)
Total Protein: 7.2 g/dL (ref 6.1–8.1)

## 2019-01-24 ENCOUNTER — Other Ambulatory Visit: Payer: Self-pay | Admitting: General Surgery

## 2019-01-24 DIAGNOSIS — Z933 Colostomy status: Secondary | ICD-10-CM

## 2019-02-12 ENCOUNTER — Ambulatory Visit: Payer: Medicare Other | Admitting: Infectious Diseases

## 2019-02-14 ENCOUNTER — Inpatient Hospital Stay: Admission: RE | Admit: 2019-02-14 | Payer: Medicare Other | Source: Ambulatory Visit

## 2019-02-15 ENCOUNTER — Ambulatory Visit: Payer: Medicare Other | Admitting: Infectious Diseases

## 2019-02-20 ENCOUNTER — Encounter: Payer: Self-pay | Admitting: Infectious Diseases

## 2019-02-20 ENCOUNTER — Ambulatory Visit (INDEPENDENT_AMBULATORY_CARE_PROVIDER_SITE_OTHER): Payer: Medicare Other | Admitting: Infectious Diseases

## 2019-02-20 ENCOUNTER — Other Ambulatory Visit: Payer: Self-pay

## 2019-02-20 DIAGNOSIS — B20 Human immunodeficiency virus [HIV] disease: Secondary | ICD-10-CM

## 2019-02-20 DIAGNOSIS — F1020 Alcohol dependence, uncomplicated: Secondary | ICD-10-CM | POA: Insufficient documentation

## 2019-02-20 MED ORDER — TRIUMEQ 600-50-300 MG PO TABS: 1.0000 | ORAL_TABLET | Freq: Every day | ORAL | 2 refills | Status: DC

## 2019-02-20 NOTE — Assessment & Plan Note (Signed)
He admits that his alcohol use is a problem and would like to enter a rehab program once he gets his colostomy reversed.  I asked him to consider stopping the binge behavior and to reduce his use to one 40 ounce drink a day.  He will work on this.  I explained my biggest concern is that he drinks to intoxication then forgets to take his medications consistently or risks of doubling up by mistake.

## 2019-02-20 NOTE — Assessment & Plan Note (Signed)
HIV 1 RNA Quant (copies/mL)  Date Value  01/18/2019 14,600 (H)  06/05/2018 21,800 (H)  02/20/2018 2,510 (H)   CD4 T Cell Abs (/uL)  Date Value  01/18/2019 464  06/05/2018 470  02/20/2018 490    We reviewed his historical labs together side-by-side today.  He was undetectable about 4 years ago but he has never been so since.  I explained that HIV treatment is a lifelong requirement currently.  We discussed how dangerous it is for him to take his medications in the way that he has with concern over emergence of resistance.  He had a viral load a month ago that shows nearly 11,000 copies.  We will resume his Triumeq and have him return in 6 weeks with labs prior to his visit.  He has motivation to have his colostomy reversed which is great for the short term however I still worry about long-term management.  He was previously working with Dr. Baxter Flattery and would very much like to see her again.  He will follow-up with me 1 more time in between.  Fortunately he has no findings that would suggest any advancing HIV disease today or opportunistic infection.  His last CD4 count in October of this year was above 400 and unlikely to be much worse as of right now.

## 2019-02-20 NOTE — Progress Notes (Signed)
Subjective:    Patient ID: Leon Nichols, male    DOB: 1965-09-14, 53 y.o.   MRN: [id number]  Chief Complaint  Patient presents with  . Follow-up    off meds    HPI:  Leon Nichols is a 53 y.o. male who presents today to get back on track with his HIV treatment.  He was last seen by Tammy Sours in March of this year where he was restarted on his Triumeq.  He tells me he was diagnosed first 10 years ago.  He was on 2 pills prior to this and has always been intermittently adherent to therapy.  He states the main reason he does not take his medication long-term is because he was feels so good he does not feel like he needs them.  Lately he has been seeking care to try and have his colostomy reversed.  His surgeon has requested that he get back on his medications consistently and have an undetectable viral load before this elective procedure would be done.  He also has plans after that his reversed to go into alcohol rehab.  He does drink alcohol on a daily basis, estimates at least two 40s a day.  He states he drinks because he likes out makes him feel and he enjoys the taste.  He does feel like it interrupts his life however.  He is not currently sexually active.  No concern with depressed or anxious mood.  He does smoke cigarettes during binge drinking episodes.   Outpatient Medications Prior to Visit  Medication Sig Dispense Refill  . amLODipine (NORVASC) 10 MG tablet Take 1 tablet (10 mg total) by mouth daily. 30 tablet 1  . lisinopril (PRINIVIL,ZESTRIL) 20 MG tablet Take 1 tablet (20 mg total) by mouth daily. 30 tablet 0  . abacavir-dolutegravir-lamiVUDine (TRIUMEQ) 600-50-300 MG tablet Take 1 tablet by mouth daily. 30 tablet 2  . neomycin-colistin-hydrocortisone-thonzonium (CORTISPORIN-TC) 3.06-05-08-0.5 MG/ML OTIC suspension Place 3 drops into the left ear 4 (four) times daily. (Patient not taking: Reported on 02/20/2019) 10 mL 0   No facility-administered medications prior to visit.      Past Medical History:  Diagnosis Date  . Acute back pain 10/28/2016  . Colitis   . Diverticulosis   . Hemorrhoids   . History of shingles   . HIV (human immunodeficiency virus infection) (HCC) 03/24/2012  . Hypertension   . Perianal abscess 02/06/2015  . Perirectal abscess   . Septic arthritis (HCC) 02/05/2015      Past Surgical History:  Procedure Laterality Date  . arthroscopic wash out of right knee    . COLONOSCOPY  08/31/2011   Procedure: COLONOSCOPY;  Surgeon: Hilarie Fredrickson, MD;  [facility]COPY;  Service: Endoscopy;  Laterality: N/A;  . DRESSING CHANGE UNDER ANESTHESIA  03/27/2012   Procedure: DRESSING CHANGE UNDER ANESTHESIA;  Surgeon: Currie Paris, MD;  Location[facility] OR;  Service: General;  Laterality: N/A;  . INCISION AND DRAINAGE PERIRECTAL ABSCESS  03/23/2012   Procedure: IRRIGATION AND DEBRIDEMENT PERIRECTAL ABSCESS;  Surgeon: Mariella Saa, MD;  Location: [facility] OR;  Service: General;  Laterality: N/A;  I & D soft tissue scrotal and perineal abscess  . KNEE ARTHROSCOPY Right 02/05/2015   Procedure: ARTHROSCOPIC [facility] OUT RIGHT KNEE;  Surgeon: Sheral Apley, MD;  Location: [facility] OR;  Service: Orthopedics;  Laterality: Right;  . LAPAROSCOPIC DIVERTED COLOSTOMY  03/27/2012   Procedure: LAPAROSCOPIC DIVERTED COLOSTOMY;  Surgeon: Currie Paris, MD;  Location: [facility] OR;  Service:  General;  Laterality: N/A;      Family History  Problem Relation Age of Onset  . Hypertension Mother   . Diabetes Mother       Social History   Socioeconomic History  . Marital status: Single    Spouse name: Not on file  . Number of children: Not on file  . Years of education: Not on file  . Highest education level: Not on file  Occupational History  . Not on file  Social Needs  . Financial resource strain: Not on file  . Food insecurity    Worry: Not on file    Inability: Not on file  . Transportation needs    Medical: Not on file    Non-medical: Not on file   Tobacco Use  . Smoking status: Light Tobacco Smoker    Packs/day: 0.25    Years: 15.00    Pack years: 3.75    Types: Cigarettes  . Smokeless tobacco: Never Used  . Tobacco comment:  5CIGARETTES A DAY  Substance and Sexual Activity  . Alcohol use: Yes    Alcohol/week: 0.0 standard drinks    Comment: rarely.  . Drug use: Not Currently    Types: Marijuana, Cocaine  . Sexual activity: Not on file  Lifestyle  . Physical activity    Days per week: Not on file    Minutes per session: Not on file  . Stress: Not on file  Relationships  . Social Herbalist on phone: Not on file    Gets together: Not on file    Attends religious service: Not on file    Active member of club or organization: Not on file    Attends meetings of clubs or organizations: Not on file    Relationship status: Not on file  . Intimate partner violence    Fear of current or ex partner: Not on file    Emotionally abused: Not on file    Physically abused: Not on file    Forced sexual activity: Not on file  Other Topics Concern  . Not on file  Social History Narrative  . Not on file      Review of Systems  Constitutional: Negative for appetite change, chills, fatigue, fever and unexpected weight change.  Eyes: Negative for visual disturbance.  Respiratory: Negative for cough, chest tightness, shortness of breath and wheezing.   Cardiovascular: Negative for chest pain and leg swelling.  Gastrointestinal: Negative for abdominal pain, constipation, diarrhea, nausea and vomiting.  Genitourinary: Negative for dysuria, flank pain, frequency, genital sores, hematuria and urgency.  Skin: Negative for rash.  Allergic/Immunologic: Positive for immunocompromised state.  Neurological: Negative for dizziness and headaches.  Psychiatric/Behavioral:       Alcohol use       Objective:    BP (!) 147/96   Pulse 87   Temp 98.2 F (36.8 C) (Oral)   Wt 219 lb (99.3 kg)   BMI 28.89 kg/m  Nursing note and  vital signs reviewed.  Physical Exam Constitutional:      General: He is not in acute distress.    Appearance: He is well-developed.  Eyes:     Conjunctiva/sclera: Conjunctivae normal.  Neck:     Musculoskeletal: Neck supple.  Cardiovascular:     Rate and Rhythm: Normal rate and regular rhythm.     Heart sounds: Normal heart sounds. No murmur. No friction rub. No gallop.   Pulmonary:     Effort: Pulmonary effort is normal.  No respiratory distress.     Breath sounds: Normal breath sounds. No wheezing or rales.  Chest:     Chest wall: No tenderness.  Abdominal:     General: Bowel sounds are normal.     Palpations: Abdomen is soft.     Tenderness: There is no abdominal tenderness.  Lymphadenopathy:     Cervical: No cervical adenopathy.  Skin:    General: Skin is warm and dry.     Findings: No rash.  Neurological:     Mental Status: He is alert and oriented to person, place, and time.  Psychiatric:        Mood and Affect: Mood normal.         Assessment & Plan:   Problem List Items Addressed This Visit      Unprioritized   HIV disease (HCC) (Chronic)    HIV 1 RNA Quant (copies/mL)  Date Value  01/18/2019 14,600 (H)  06/05/2018 21,800 (H)  02/20/2018 2,510 (H)   CD4 T Cell Abs (/uL)  Date Value  01/18/2019 464  06/05/2018 470  02/20/2018 490    We reviewed his historical labs together side-by-side today.  He was undetectable about 4 years ago but he has never been so since.  I explained that HIV treatment is a lifelong requirement currently.  We discussed how dangerous it is for him to take his medications in the way that he has with concern over emergence of resistance.  He had a viral load a month ago that shows nearly 11,000 copies.  We will resume his Triumeq and have him return in 6 weeks with labs prior to his visit.  He has motivation to have his colostomy reversed which is great for the short term however I still worry about long-term management.  He was  previously working with Dr. Drue SecondSnider and would very much like to see her again.  He will follow-up with me 1 more time in between.  Fortunately he has no findings that would suggest any advancing HIV disease today or opportunistic infection.  His last CD4 count in October of this year was above 400 and unlikely to be much worse as of right now.      Relevant Medications   abacavir-dolutegravir-lamiVUDine (TRIUMEQ) 600-50-300 MG tablet   Other Relevant Orders   T-helper cell (CD4)- ([facility] clinic only)   HIV 1 RNA quant-no reflex-bld   Alcohol dependency (HCC)    He admits that his alcohol use is a problem and would like to enter a rehab program once he gets his colostomy reversed.  I asked him to consider stopping the binge behavior and to reduce his use to one 40 ounce drink a day.  He will work on this.  I explained my biggest concern is that he drinks to intoxication then forgets to take his medications consistently or risks of doubling up by mistake.        Return in about 6 weeks (around 04/03/2019).  Rexene AlbertsStephanie Dixon, MSN, NP-C [facility]  [email or web address] Pager: [phone] Office: [phone] [facility] Main Line: [phone]

## 2019-02-20 NOTE — Patient Instructions (Signed)
I sent in refills for your Triumeq so we can get you back on track!   Do your best to get this in every single day. It will take probably 2 months or so (maybe longer) to get your viral load undetectable.  Then we need to keep you on your medications so we can keep you healthy for many years to come.   I look forward to seeing you back!  Please schedule a visit for labs in 4-6 weeks then come back to see Colletta Maryland or Dr. Baxter Flattery 2 weeks later so we can go over your results and see how you are doing. This is important to do follow up blood work so we know with certainty your medication is working.  If we don't check we don't know.    Happy Holidays to you and stay safe!

## 2019-03-19 ENCOUNTER — Other Ambulatory Visit: Payer: Medicare Other

## 2019-04-05 ENCOUNTER — Other Ambulatory Visit: Payer: Medicare Other

## 2019-04-09 ENCOUNTER — Other Ambulatory Visit: Payer: Medicare Other

## 2019-04-09 ENCOUNTER — Other Ambulatory Visit: Payer: Self-pay

## 2019-04-09 DIAGNOSIS — B20 Human immunodeficiency virus [HIV] disease: Secondary | ICD-10-CM

## 2019-04-10 ENCOUNTER — Encounter: Payer: Medicare Other | Admitting: Internal Medicine

## 2019-04-10 LAB — T-HELPER CELL (CD4) - (RCID CLINIC ONLY)
CD4 % Helper T Cell: 26 % — ABNORMAL LOW (ref 33–65)
CD4 T Cell Abs: 681 /uL (ref 400–1790)

## 2019-04-13 LAB — HIV-1 RNA QUANT-NO REFLEX-BLD
HIV 1 RNA Quant: 47 copies/mL — ABNORMAL HIGH
HIV-1 RNA Quant, Log: 1.67 Log copies/mL — ABNORMAL HIGH

## 2019-04-18 ENCOUNTER — Other Ambulatory Visit: Payer: Self-pay

## 2019-04-18 ENCOUNTER — Ambulatory Visit (INDEPENDENT_AMBULATORY_CARE_PROVIDER_SITE_OTHER): Payer: Medicare Other | Admitting: Internal Medicine

## 2019-04-18 ENCOUNTER — Encounter: Payer: Self-pay | Admitting: Internal Medicine

## 2019-04-18 VITALS — BP 156/97 | HR 99 | Wt 221.0 lb

## 2019-04-18 DIAGNOSIS — B20 Human immunodeficiency virus [HIV] disease: Secondary | ICD-10-CM

## 2019-04-18 DIAGNOSIS — Z23 Encounter for immunization: Secondary | ICD-10-CM | POA: Diagnosis not present

## 2019-04-18 DIAGNOSIS — Z716 Tobacco abuse counseling: Secondary | ICD-10-CM

## 2019-04-18 DIAGNOSIS — H6062 Unspecified chronic otitis externa, left ear: Secondary | ICD-10-CM

## 2019-04-18 DIAGNOSIS — I10 Essential (primary) hypertension: Secondary | ICD-10-CM

## 2019-04-18 MED ORDER — TRIUMEQ 600-50-300 MG PO TABS: 1.0000 | ORAL_TABLET | Freq: Every day | ORAL | 5 refills | Status: DC

## 2019-04-18 MED ORDER — AMLODIPINE BESYLATE 10 MG PO TABS: 10.0000 mg | ORAL_TABLET | Freq: Every day | ORAL | 11 refills | Status: DC

## 2019-04-18 MED ORDER — LISINOPRIL 20 MG PO TABS: 20.0000 mg | ORAL_TABLET | Freq: Every day | ORAL | 11 refills | Status: DC

## 2019-04-18 NOTE — Progress Notes (Signed)
RFV: follow up for hiv disease  Patient ID: Leon Nichols, male   DOB: 10-01-1965, 54 y.o.   MRN: [id number]  HPI  54yo M with hiv disease on triumeq with cd 4 count of 681/VL 41. Taking it a daily. Misses about 4 doses month that he reports. Overall doing well. No covid exposures. He does report that he has discomfort to Left ear = chronic itching, tinnitus.   Outpatient Encounter Medications as of 04/18/2019  Medication Sig  . abacavir-dolutegravir-lamiVUDine (TRIUMEQ) 600-50-300 MG tablet Take 1 tablet by mouth daily.  Marland Kitchen amLODipine (NORVASC) 10 MG tablet Take 1 tablet (10 mg total) by mouth daily.  Marland Kitchen lisinopril (PRINIVIL,ZESTRIL) 20 MG tablet Take 1 tablet (20 mg total) by mouth daily.  Marland Kitchen neomycin-colistin-hydrocortisone-thonzonium (CORTISPORIN-TC) 3.06-05-08-0.5 MG/ML OTIC suspension Place 3 drops into the left ear 4 (four) times daily. (Patient not taking: Reported on 02/20/2019)   No facility-administered encounter medications on file as of 04/18/2019.     Patient Active Problem List   Diagnosis Date Noted  . Alcohol dependency (HCC) 02/20/2019  . Chronic otitis externa of left ear 06/30/2018  . Headache 10/15/2015  . 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 Neuralgia, post-herpetic 01/02/2014  . HIV disease (HCC) 03/24/2012  . Anemia 03/23/2012  . Essential hypertension 03/29/2007     Health Maintenance Due  Topic Date Due  . TETANUS/TDAP  10/27/1986  . INFLUENZA VACCINE  11/04/2018    Sochx: + 2 smokes per day, no illicit drugs   Review of Systems Review of Systems  Constitutional: Negative for fever, chills, diaphoresis, activity change, appetite change, fatigue and unexpected weight change.  HENT: Negative for congestion, sore throat, rhinorrhea, sneezing, trouble swallowing and sinus pressure.  Eyes: Negative for photophobia and visual disturbance.  Respiratory: Negative for cough, chest tightness, shortness of breath, wheezing and  stridor.  Cardiovascular: Negative for chest pain, palpitations and leg swelling.  Gastrointestinal: Negative for nausea, vomiting, abdominal pain, diarrhea, constipation, blood in stool, abdominal distention and anal bleeding.  Genitourinary: Negative for dysuria, hematuria, flank pain and difficulty urinating.  Musculoskeletal: Negative for myalgias, back pain, joint swelling, arthralgias and gait problem.  Skin: Negative for color change, pallor, rash and wound.  Neurological: +tinnitus Hematological: Negative for adenopathy. Does not bruise/bleed easily.  Psychiatric/Behavioral: Negative for behavioral problems, confusion, sleep disturbance, dysphoric mood, decreased concentration and agitation.    Physical Exam   BP (!) 174/108   Pulse 99   Wt 221 lb (100.2 kg)   BMI 29.16 kg/m   Physical Exam  Constitutional: He is oriented to person, place, and time. He appears well-developed and well-nourished. No distress.  HENT:  No OE noted Mouth/Throat: Oropharynx is clear and moist. No oropharyngeal exudate.  Cardiovascular: Normal rate, regular rhythm and normal heart sounds. Exam reveals no gallop and no friction rub.  No murmur heard.  Pulmonary/Chest: Effort normal and breath sounds normal. No respiratory distress. He has no wheezes.  Abdominal: Soft. Bowel sounds are normal. He exhibits no distension. There is no tenderness.  Lymphadenopathy:  He has no cervical adenopathy.  Neurological: He is alert and oriented to person, place, and time.  Skin: Skin is warm and dry. No rash noted. No erythema.  Psychiatric: He has a normal mood and affect. His behavior is normal.    Lab Results  Component Value Date   CD4TCELL 26 (L) 04/09/2019   Lab Results  Component Value Date   CD4TABS 681 04/09/2019   CD4TABS 464  01/18/2019   CD4TABS 470 06/05/2018   Lab Results  Component Value Date   HIV1RNAQUANT 47 (H) 04/09/2019   No results found for: HEPBSAB Lab Results  Component Value  Date   LABRPR NON-REACTIVE 06/05/2018    CBC Lab Results  Component Value Date   WBC 4.6 01/18/2019   RBC 4.38 01/18/2019   HGB 14.0 01/18/2019   HCT 41.0 01/18/2019   PLT 222 01/18/2019   MCV 93.6 01/18/2019   MCH 32.0 01/18/2019   MCHC 34.1 01/18/2019   RDW 13.0 01/18/2019   LYMPHSABS 1,872 01/18/2019   MONOABS 0.5 10/24/2017   EOSABS 18 01/18/2019    BMET Lab Results  Component Value Date   NA 134 (L) 01/18/2019   K 4.1 01/18/2019   CL 101 01/18/2019   CO2 24 01/18/2019   GLUCOSE 126 (H) 01/18/2019   BUN 11 01/18/2019   CREATININE 0.85 01/18/2019   CALCIUM 8.7 01/18/2019   GFRNONAA 101 01/18/2019   GFRAA 117 01/18/2019    Assessment and Plan   hiv disease = continue on triumeq  htn = smoking cessation. Continue on the other meds  Health maintenance = prevnar 13 today  Dermatitis of external auditory canal = recommend not to pick at ear. Can use vaseline on outermost aspect of ear that is dry  Smoking cessation = spent 15 min on smoking cessation counseling

## 2019-07-09 ENCOUNTER — Other Ambulatory Visit: Payer: Self-pay

## 2019-07-09 ENCOUNTER — Encounter (HOSPITAL_COMMUNITY): Payer: Self-pay

## 2019-07-09 ENCOUNTER — Ambulatory Visit (HOSPITAL_COMMUNITY)
Admission: EM | Admit: 2019-07-09 | Discharge: 2019-07-09 | Discharge status: Against Medical Advice | Payer: Medicare Other | Attending: Internal Medicine | Admitting: Internal Medicine

## 2019-07-09 NOTE — ED Notes (Signed)
Pt called x 3 no answer 

## 2019-07-09 NOTE — ED Triage Notes (Signed)
Pt c/o 10/10 sharp right groin painx1-2 mos. Pt doesn't remember injuring it. Pt walked to triage room, but was clearly uncomfortable upon sitting down.

## 2019-09-02 ENCOUNTER — Emergency Department (HOSPITAL_COMMUNITY): Payer: Medicare Other

## 2019-09-02 ENCOUNTER — Other Ambulatory Visit: Payer: Self-pay

## 2019-09-02 ENCOUNTER — Encounter (HOSPITAL_COMMUNITY): Payer: Self-pay | Admitting: Emergency Medicine

## 2019-09-02 ENCOUNTER — Emergency Department (HOSPITAL_COMMUNITY)
Admission: EM | Admit: 2019-09-02 | Discharge: 2019-09-02 | Disposition: A | Discharge status: Home / Self Care | Payer: Medicare Other | Attending: Emergency Medicine | Admitting: Emergency Medicine

## 2019-09-02 ENCOUNTER — Ambulatory Visit (INDEPENDENT_AMBULATORY_CARE_PROVIDER_SITE_OTHER)
Admission: EM | Admit: 2019-09-02 | Discharge: 2019-09-02 | Disposition: A | Discharge status: Other Acute Inpatient Hospital | Payer: Medicare Other | Source: Home / Self Care

## 2019-09-02 DIAGNOSIS — N492 Inflammatory disorders of scrotum: Secondary | ICD-10-CM

## 2019-09-02 DIAGNOSIS — N5082 Scrotal pain: Secondary | ICD-10-CM | POA: Diagnosis present

## 2019-09-02 DIAGNOSIS — Z720 Tobacco use: Secondary | ICD-10-CM | POA: Diagnosis not present

## 2019-09-02 DIAGNOSIS — Z79899 Other long term (current) drug therapy: Secondary | ICD-10-CM | POA: Diagnosis not present

## 2019-09-02 DIAGNOSIS — I10 Essential (primary) hypertension: Secondary | ICD-10-CM | POA: Insufficient documentation

## 2019-09-02 DIAGNOSIS — B20 Human immunodeficiency virus [HIV] disease: Secondary | ICD-10-CM | POA: Diagnosis not present

## 2019-09-02 LAB — CBC WITH DIFFERENTIAL/PLATELET
Abs Immature Granulocytes: 0.01 10*3/uL (ref 0.00–0.07)
Basophils Absolute: 0 10*3/uL (ref 0.0–0.1)
Basophils Relative: 0 %
Eosinophils Absolute: 0.1 10*3/uL (ref 0.0–0.5)
Eosinophils Relative: 1 %
HCT: 46.8 % (ref 39.0–52.0)
Hemoglobin: 15.5 g/dL (ref 13.0–17.0)
Immature Granulocytes: 0 %
Lymphocytes Relative: 33 %
Lymphs Abs: 1.8 10*3/uL (ref 0.7–4.0)
MCH: 32.2 pg (ref 26.0–34.0)
MCHC: 33.1 g/dL (ref 30.0–36.0)
MCV: 97.3 fL (ref 80.0–100.0)
Monocytes Absolute: 0.4 10*3/uL (ref 0.1–1.0)
Monocytes Relative: 7 %
Neutro Abs: 3.3 10*3/uL (ref 1.7–7.7)
Neutrophils Relative %: 59 %
Platelets: 247 10*3/uL (ref 150–400)
RBC: 4.81 MIL/uL (ref 4.22–5.81)
RDW: 13.5 % (ref 11.5–15.5)
WBC: 5.6 10*3/uL (ref 4.0–10.5)
nRBC: 0 % (ref 0.0–0.2)

## 2019-09-02 LAB — URINALYSIS, ROUTINE W REFLEX MICROSCOPIC
Bilirubin Urine: NEGATIVE
Glucose, UA: NEGATIVE mg/dL
Ketones, ur: NEGATIVE mg/dL
Leukocytes,Ua: NEGATIVE
Nitrite: NEGATIVE
Protein, ur: 30 mg/dL — AB
Specific Gravity, Urine: 1.005 (ref 1.005–1.030)
pH: 5 (ref 5.0–8.0)

## 2019-09-02 LAB — COMPREHENSIVE METABOLIC PANEL
ALT: 16 U/L (ref 0–44)
AST: 26 U/L (ref 15–41)
Albumin: 3.7 g/dL (ref 3.5–5.0)
Alkaline Phosphatase: 84 U/L (ref 38–126)
Anion gap: 16 — ABNORMAL HIGH (ref 5–15)
BUN: 7 mg/dL (ref 6–20)
CO2: 19 mmol/L — ABNORMAL LOW (ref 22–32)
Calcium: 9.3 mg/dL (ref 8.9–10.3)
Chloride: 102 mmol/L (ref 98–111)
Creatinine, Ser: 0.8 mg/dL (ref 0.61–1.24)
GFR calc Af Amer: >60 mL/min (ref >60)
GFR calc non Af Amer: >60 mL/min (ref >60)
Glucose, Bld: 97 mg/dL (ref 70–99)
Potassium: 3.9 mmol/L (ref 3.5–5.1)
Sodium: 137 mmol/L (ref 135–145)
Total Bilirubin: 0.4 mg/dL (ref 0.3–1.2)
Total Protein: 9.4 g/dL — ABNORMAL HIGH (ref 6.5–8.1)

## 2019-09-02 LAB — LACTIC ACID, PLASMA
Lactic Acid, Venous: 2.5 mmol/L (ref 0.5–1.9)
Lactic Acid, Venous: 2.2 mmol/L (ref 0.5–1.9)

## 2019-09-02 LAB — PROTIME-INR
INR: 1.1 (ref 0.8–1.2)
Prothrombin Time: 13.3 seconds (ref 11.4–15.2)

## 2019-09-02 LAB — APTT: aPTT: 32 seconds (ref 24–36)

## 2019-09-02 MED ORDER — SODIUM CHLORIDE 0.9 % IV BOLUS
1000.0000 mL | Freq: Once | INTRAVENOUS | Status: AC
  Administered 2019-09-02: 1000 mL via INTRAVENOUS

## 2019-09-02 MED ORDER — AMLODIPINE BESYLATE 5 MG PO TABS
10.0000 mg | ORAL_TABLET | Freq: Every day | ORAL | Status: DC
  Administered 2019-09-02: 10 mg via ORAL
  Filled 2019-09-02: qty 2

## 2019-09-02 MED ORDER — MORPHINE SULFATE (PF) 2 MG/ML IV SOLN
2.0000 mg | Freq: Once | INTRAVENOUS | Status: AC
  Administered 2019-09-02: 2 mg via INTRAVENOUS
  Filled 2019-09-02: qty 1

## 2019-09-02 MED ORDER — LISINOPRIL 20 MG PO TABS
20.0000 mg | ORAL_TABLET | Freq: Every day | ORAL | Status: DC
  Administered 2019-09-02: 20 mg via ORAL
  Filled 2019-09-02: qty 1

## 2019-09-02 MED ORDER — PIPERACILLIN-TAZOBACTAM 3.375 G IVPB 30 MIN
3.3750 g | Freq: Once | INTRAVENOUS | Status: AC
  Administered 2019-09-02: 3.375 g via INTRAVENOUS
  Filled 2019-09-02: qty 50

## 2019-09-02 MED ORDER — VANCOMYCIN HCL 2000 MG/400ML IV SOLN
2000.0000 mg | Freq: Once | INTRAVENOUS | Status: AC
  Administered 2019-09-02: 2000 mg via INTRAVENOUS
  Filled 2019-09-02: qty 400

## 2019-09-02 MED ORDER — AMOXICILLIN-POT CLAVULANATE 875-125 MG PO TABS: 1.0000 | ORAL_TABLET | Freq: Two times a day (BID) | ORAL | 0 refills | Status: AC

## 2019-09-02 NOTE — Discharge Instructions (Addendum)
Please make sure to take the antibiotic twice a day until gone.  It is extremely importantly take this medication in order to prevent the spread of life-threatening infection.  Please follow-up with Dr. Marlou Porch in 2 weeks.  Call on Monday to schedule an appointment.  Return to the ER if your symptoms worsen.

## 2019-09-02 NOTE — Discharge Instructions (Addendum)
Sent to ED for I&D of scrotal abscess.

## 2019-09-02 NOTE — ED Provider Notes (Signed)
[place]    CSN: [id number] Arrival date & time: 09/02/19  1033      History   Chief Complaint Chief Complaint  Patient presents with  . Groin Pain    HPI Leon Nichols is a 54 y.o. male.   Patient here c/w "groin pain" x 3 months. PMH HTN, HIV +. Pain started gradually, denies precipitating event.  Notes pain worsening, located entire length from scrotum, from posterior to anterior.  Denies f/c, n/v/d/c, hematochezia, melena, abdominal pain, changes in urination.  He is requesting "pain medication".     Past Medical History:  Diagnosis Date  . Acute back pain 10/28/2016  . Colitis   . Diverticulosis   . Hemorrhoids   . History of shingles   . HIV (human immunodeficiency virus infection) ([place]) 03/24/2012  . Hypertension   . Perianal abscess 02/06/2015  . Perirectal abscess   . Septic arthritis ([place]) 02/05/2015    Patient Active Problem List   Diagnosis Date Noted  . Alcohol dependency ([place]) 02/20/2019  . Chronic otitis externa of left ear 06/30/2018  . Headache 10/15/2015  . Altered bowel elimination due to intestinal ostomy ([place]) 02/04/2014  . Neuralgia, post-herpetic 01/02/2014  . HIV disease ([place]) 03/24/2012  . Anemia 03/23/2012  . Essential hypertension 03/29/2007    Past Surgical History:  Procedure Laterality Date  . arthroscopic wash out of right knee    . COLONOSCOPY  08/31/2011   Procedure: COLONOSCOPY;  Surgeon: [name], MD;  Location: [facility] ENDOSCOPY;  Service: Endoscopy;  Laterality: N/A;  . DRESSING CHANGE UNDER ANESTHESIA  03/27/2012   Procedure: DRESSING CHANGE UNDER ANESTHESIA;  Surgeon: [name], MD;  Location: [place];  Service: General;  Laterality: N/A;  . INCISION AND DRAINAGE PERIRECTAL ABSCESS  03/23/2012   Procedure: IRRIGATION AND DEBRIDEMENT PERIRECTAL ABSCESS;  Surgeon: [name], MD;  Location: [place];  Service: General;  Laterality: N/A;  I & D soft tissue scrotal and perineal abscess  . KNEE  ARTHROSCOPY Right 02/05/2015   Procedure: ARTHROSCOPIC [facility] OUT RIGHT KNEE;  Surgeon: Renette Butters, MD;  Location: [place];  Service: Orthopedics;  Laterality: Right;  . LAPAROSCOPIC DIVERTED COLOSTOMY  03/27/2012   Procedure: LAPAROSCOPIC DIVERTED COLOSTOMY;  Surgeon: [name], MD;  Location: [place];  Service: General;  Laterality: N/A;       Home Medications    Prior to Admission medications   Medication Sig Start Date End Date Taking? Authorizing Provider  abacavir-dolutegravir-lamiVUDine (TRIUMEQ) 600-50-300 MG tablet Take 1 tablet by mouth daily. 04/18/19  Yes [name], MD  amLODipine (NORVASC) 10 MG tablet Take 1 tablet (10 mg total) by mouth daily. 04/18/19  Yes [name], MD  lisinopril (ZESTRIL) 20 MG tablet Take 1 tablet (20 mg total) by mouth daily. 04/18/19  Yes [name], MD    Family History Family History  Problem Relation Age of Onset  . Hypertension Mother   . Diabetes Mother     Social History Social History   Tobacco Use  . Smoking status: Light Tobacco Smoker    Packs/day: 0.25    Years: 15.00    Pack years: 3.75    Types: Cigarettes  . Smokeless tobacco: Never Used  . Tobacco comment: less than 5 cigarettes a day  Substance Use Topics  . Alcohol use: Yes    Alcohol/week: 0.0 standard drinks    Comment: rarely.  . Drug use: Not Currently    Types: Marijuana, Cocaine  Allergies   Patient has no known allergies.   Review of Systems Review of Systems  Constitutional: Negative for chills and fever.  Eyes: Negative for pain and visual disturbance.  Respiratory: Negative for cough and shortness of breath.   Cardiovascular: Negative for chest pain and palpitations.  Gastrointestinal: Negative for abdominal pain, blood in stool, constipation, diarrhea, nausea and vomiting.  Genitourinary: Positive for scrotal swelling and testicular pain. Negative for decreased urine volume, discharge, dysuria, frequency, genital sores,  hematuria, penile pain and penile swelling.  Musculoskeletal: Negative for arthralgias, back pain, gait problem, joint swelling and myalgias.  Skin: Negative for color change and rash.  Neurological: Negative for seizures and syncope.  Psychiatric/Behavioral: Positive for sleep disturbance. Negative for confusion.  All other systems reviewed and are negative.    Physical Exam Triage Vital Signs ED Triage Vitals  Enc Vitals Group     BP 09/02/19 1112 (!) 130/95     Pulse Rate 09/02/19 1112 88     Resp 09/02/19 1112 14     Temp 09/02/19 1112 98 F (36.7 C)     Temp Source 09/02/19 1112 Oral     SpO2 09/02/19 1112 100 %     Weight --      Height --      Head Circumference --      Peak Flow --      Pain Score 09/02/19 1114 10     Pain Loc --      Pain Edu? --      Excl. in GC? --    No data found.  Updated Vital Signs BP (!) 130/95 (BP Location: Right Arm)   Pulse 88   Temp 98 F (36.7 C) (Oral)   Resp 14   SpO2 100%   Visual Acuity Right Eye Distance:   Left Eye Distance:   Bilateral Distance:    Right Eye Near:   Left Eye Near:    Bilateral Near:     Physical Exam Vitals and nursing note reviewed.  Constitutional:      Appearance: Normal appearance. He is well-developed.  HENT:     Head: Normocephalic and atraumatic.     Nose: Nose normal.     Mouth/Throat:     Mouth: Mucous membranes are moist.  Eyes:     General: No scleral icterus.    Conjunctiva/sclera: Conjunctivae normal.  Pulmonary:     Effort: Pulmonary effort is normal. No respiratory distress.  Abdominal:     General: Abdomen is flat. Bowel sounds are normal.     Tenderness: There is no abdominal tenderness. There is no right CVA tenderness, left CVA tenderness or guarding.  Genitourinary:    Testes: Normal.       Comments: Induration and swelling R side scrotal wall, small draining wound with copious purulent d/c, noted. Musculoskeletal:     Cervical back: Normal range of motion and neck  supple. No rigidity.  Skin:    General: Skin is warm and dry.     Capillary Refill: Capillary refill takes less than 2 seconds.  Neurological:     General: No focal deficit present.     Mental Status: He is alert and oriented to person, place, and time.     Motor: No weakness.     Gait: Gait normal.  Psychiatric:        Mood and Affect: Mood normal.        Behavior: Behavior normal.      UC Treatments / Results  Labs (all labs ordered are listed, but only abnormal results are displayed) Labs Reviewed - No data to display  EKG   Radiology No results found.  Procedures Procedures (including critical care time)  Medications Ordered in UC Medications - No data to display  Initial Impression / Assessment and Plan / UC Course  I have reviewed the triage vital signs and the nursing notes.  Pertinent labs & imaging results that were available during my care of the patient were reviewed by me and considered in my medical decision making (see chart for details).     Sent to ED for further evaluation and possible [place] of scrotum to assess size of abscess  Final Clinical Impressions(s) / UC Diagnoses   Final diagnoses:  Scrotal wall abscess     Discharge Instructions     Sent to ED for I&D of scrotal abscess.     ED Prescriptions    None     PDMP not reviewed this encounter.   Evern Core, PA-C 09/02/19 1150

## 2019-09-02 NOTE — ED Notes (Signed)
Patient is being discharged from the Urgent Care Center and sent to the Emergency Department via personal vehicle by self. Per provider Evern Core, patient is stable but in need of higher level of care due to scrotal abscess. Patient is aware and verbalizes understanding of plan of care.   Vitals:   09/02/19 1112  BP: (!) 130/95  Pulse: 88  Resp: 14  Temp: 98 F (36.7 C)  SpO2: 100%

## 2019-09-02 NOTE — ED Provider Notes (Signed)
Patient received at shift handoff from [facility].  Please see his note for full HPI.  In short, patient is a 54 year old male with a history of HIV, hypertension who presents to the ED from urgent care with scrotal pain.  Patient refers that he has been having "pins and needle feeling" in his right scrotum for 4 to 5 months now, but it became worse approximately a month ago.  He was seen at urgent care earlier today and was sent to the ER for rule out of scrotal abscess.  No fevers, chills, chest pain, dysuria, hematuria, penile pain, penile discharge, nausea, vomiting, abdominal pain.  UA with small amount of blood but no evidence of UTI.  Ultrasound with concern for Fournier's gangrene.  Will initiate sepsis work-up per order set.  Started on Vanco and Zosyn.  Urology consult pending.  Anticipate need for admission for IV antibiotics and possible surgical intervention.  Initial lactate elevated at 2.5  Patient was seen by Dr. Marlou Porch with urology.  He has a low concern for Fournier's gangrene.  He recommends broad-spectrum antibiotics and follow-up in 2 weeks.  Patient's lactic acid is downtrending, repeat at 2.2.  He received 2 L fluid bolus and pain medication in the ER.  Finished course of Vanco and Zosyn.  I explained to the patient the extreme importance of finishing the antibiotic course, and that noncompliance could result in surgical removal of the scrotum if the infection becomes severe.  He voices understanding and is agreeable to outpatient management with close follow-up in 2 weeks with Dr. Marlou Porch.  Will DC with Augmentin x14 days per Dr. Jasmine Awe recommendation.  Patient has remained fairly hypertensive in the ER with his systolic in the 150s and diastolic in the low 100s.  Patient admits to not taking his blood pressure medications today.  Reordered home meds and given in the ER, no signs of hypertensive urgency/emergency.  At this stage in the ED course, the patient has been adequately  screened and is stable for discharge.  The patient was seen and evaluated by Dr. Lynelle Doctor and he is agreeable to the above plan.   Physical Exam  BP (!) 151/105 (BP Location: Right Arm)   Pulse 89   Temp 98.7 F (37.1 C) (Oral)   Resp 18   Ht 6\' 1"  (1.854 m)   Wt 101.6 kg   SpO2 97%   BMI 29.55 kg/m   Physical Exam Vitals and nursing note reviewed. Exam conducted with a chaperone present.  Constitutional:      General: He is not in acute distress.    Appearance: Normal appearance. He is well-developed. He is not ill-appearing, toxic-appearing or diaphoretic.  HENT:     Head: Normocephalic and atraumatic.     Mouth/Throat:     Mouth: Mucous membranes are moist.     Pharynx: Oropharynx is clear.  Eyes:     Conjunctiva/sclera: Conjunctivae normal.  Cardiovascular:     Rate and Rhythm: Normal rate and regular rhythm.     Heart sounds: No murmur.  Pulmonary:     Effort: Pulmonary effort is normal. No respiratory distress.     Breath sounds: Normal breath sounds.  Abdominal:     Palpations: Abdomen is soft.     Tenderness: There is no abdominal tenderness.  Genitourinary:    Testes:        Right: Cremasteric reflex is present.         Left: Cremasteric reflex is present.  Comments: Right scrotum with multiple scattered lesions.  Purulent malodorous pus present in inguinal canal and around scrotum bilaterally.  Mildly boggy to palpation.  No evidence of erythema, swelling, necrosis.  Mildly tender to palpation.  Normal penis.  Cremasteric reflex present. Musculoskeletal:     Cervical back: Neck supple.  Skin:    General: Skin is warm and dry.  Neurological:     General: No focal deficit present.     Mental Status: He is alert and oriented to person, place, and time.  Psychiatric:        Mood and Affect: Mood normal.        Behavior: Behavior normal.     ED Course/Procedures   Clinical Course as of Sep 01 2100  Sun Sep 02, 2019  1721 Lactic Acid, Venous(!!): 2.5  [MB]    Clinical Course User Index [MB] Garald Balding, PA-C    Procedures  MDM         Garald Balding, PA-C 09/02/19 2102    Dorie Rank, MD 09/03/19 [phone]

## 2019-09-02 NOTE — ED Notes (Signed)
Pt in Room 30 for ultrasound

## 2019-09-02 NOTE — Consult Note (Signed)
Reviewed ultrasound quickly examined patient.  He has a small scrotal abscess that is draining.  I recommend that he be placed on broad spectrum antibiotics for two weeks and  F/u with me in clinic in two weeks.  He can discharged home from my prospective.

## 2019-09-02 NOTE — ED Triage Notes (Signed)
C/o R groin x 3-4 months.  Denies urinary complaints.

## 2019-09-02 NOTE — ED Provider Notes (Addendum)
MSE was initiated and I personally evaluated the patient and placed orders (if any) at  3:32 PM on Sep 02, 2019.  The patient appears stable so that the remainder of the MSE may be completed by another provider.  HPI Comments: Leon Nichols is a 54 y.o. male with history of hypertension and HIV who presents to the Emergency Department complaining of scrotal pain.  Patient notes a point of worsening pain and swelling to the right scrotum.  His pain has been worsening for the last 3 to 4 months.  He was seen in urgent care earlier today and was sent to the emergency department due to a possible scrotal abscess.  He is HIV positive and followed by ID.  He denies any fevers, chills, chest pain, shortness of breath, abdominal pain, nausea, vomiting, diarrhea, penile pain, penile discharge.  Plan: Ultrasound of the scrotum has been ordered.  Pt will be signed out to [facility] as it is the end of my shift.      Placido Sou, PA-C 09/02/19 1534    Tilden Fossa, MD 09/02/19 [phone]

## 2019-09-02 NOTE — ED Notes (Signed)
Pt was advised not to drive a motor vehicle and that he needed to have someone to pick him up. I asked if I could call someone for him. He stated that his sister leave close to the hospital that he would call her and she would get him. I warned him a second time about driving and he promised that he would call his sister to pick him up.   Pt. Is aler and oriented and stable on his feet

## 2019-09-02 NOTE — ED Triage Notes (Signed)
Pt c/o groin pain x 3 months. Pt denies any urinary problems. Pt is uncomfortable sitting, walking and sleeping. Nothing seems to make it better. Feels like pins sticking, some numbness in left leg.

## 2019-09-03 LAB — URINE CULTURE: Culture: NO GROWTH

## 2019-09-07 LAB — CULTURE, BLOOD (ROUTINE X 2)
Culture: NO GROWTH
Culture: NO GROWTH
Special Requests: ADEQUATE

## 2019-10-01 ENCOUNTER — Other Ambulatory Visit: Payer: Self-pay

## 2019-10-01 DIAGNOSIS — Z113 Encounter for screening for infections with a predominantly sexual mode of transmission: Secondary | ICD-10-CM

## 2019-10-01 DIAGNOSIS — Z79899 Other long term (current) drug therapy: Secondary | ICD-10-CM

## 2019-10-01 DIAGNOSIS — B20 Human immunodeficiency virus [HIV] disease: Secondary | ICD-10-CM

## 2019-10-04 ENCOUNTER — Other Ambulatory Visit: Payer: Medicare Other

## 2019-10-22 ENCOUNTER — Encounter: Payer: Medicare Other | Admitting: Internal Medicine

## 2019-10-30 ENCOUNTER — Encounter: Payer: Medicare Other | Admitting: Internal Medicine

## 2019-10-31 ENCOUNTER — Telehealth: Payer: Self-pay

## 2019-10-31 NOTE — Telephone Encounter (Signed)
Called patient to get him an appointment scheduled for labs and follow up with provider, no answer or voicemail set up

## 2019-11-02 ENCOUNTER — Telehealth: Payer: Self-pay

## 2019-11-02 NOTE — Telephone Encounter (Signed)
Called patient to reschedule appointment missed with Dr. Drue Second but no answer and no voice mail set up

## 2019-11-02 NOTE — Telephone Encounter (Signed)
-----   Message from Crissie Figures sent at 10/30/2019  4:34 PM EDT ----- Regarding: No Show Please contact patient to reschedule if time permits, thank you!

## 2019-12-11 ENCOUNTER — Other Ambulatory Visit: Payer: Self-pay

## 2019-12-11 DIAGNOSIS — B20 Human immunodeficiency virus [HIV] disease: Secondary | ICD-10-CM

## 2019-12-11 MED ORDER — TRIUMEQ 600-50-300 MG PO TABS: 1.0000 | ORAL_TABLET | Freq: Every day | ORAL | 0 refills | Status: DC

## 2019-12-17 ENCOUNTER — Other Ambulatory Visit: Payer: Medicare Other

## 2019-12-19 ENCOUNTER — Other Ambulatory Visit: Payer: Medicare Other

## 2019-12-19 ENCOUNTER — Other Ambulatory Visit (HOSPITAL_COMMUNITY)
Admission: RE | Admit: 2019-12-19 | Discharge: 2019-12-19 | Disposition: A | Discharge status: Home / Self Care | Payer: Medicare Other | Source: Ambulatory Visit | Attending: Internal Medicine | Admitting: Internal Medicine

## 2019-12-19 ENCOUNTER — Other Ambulatory Visit: Payer: Self-pay

## 2019-12-19 DIAGNOSIS — B20 Human immunodeficiency virus [HIV] disease: Secondary | ICD-10-CM | POA: Insufficient documentation

## 2019-12-19 DIAGNOSIS — Z113 Encounter for screening for infections with a predominantly sexual mode of transmission: Secondary | ICD-10-CM

## 2019-12-19 DIAGNOSIS — Z79899 Other long term (current) drug therapy: Secondary | ICD-10-CM

## 2019-12-20 LAB — URINE CYTOLOGY ANCILLARY ONLY
Chlamydia: NEGATIVE
Comment: NEGATIVE
Comment: NORMAL
Neisseria Gonorrhea: NEGATIVE

## 2019-12-20 LAB — T-HELPER CELL (CD4) - (RCID CLINIC ONLY)
CD4 % Helper T Cell: 28 % — ABNORMAL LOW (ref 33–65)
CD4 T Cell Abs: 659 /uL (ref 400–1790)

## 2019-12-23 LAB — CBC WITH DIFFERENTIAL/PLATELET
Absolute Monocytes: 439 cells/uL (ref 200–950)
Basophils Absolute: 20 cells/uL (ref 0–200)
Basophils Relative: 0.4 %
Eosinophils Absolute: 61 cells/uL (ref 15–500)
Eosinophils Relative: 1.2 %
HCT: 43.1 % (ref 38.5–50.0)
Hemoglobin: 14.4 g/dL (ref 13.2–17.1)
Lymphs Abs: 2672 cells/uL (ref 850–3900)
MCH: 30.9 pg (ref 27.0–33.0)
MCHC: 33.4 g/dL (ref 32.0–36.0)
MCV: 92.5 fL (ref 80.0–100.0)
MPV: 9.6 fL (ref 7.5–12.5)
Monocytes Relative: 8.6 %
Neutro Abs: 1907 cells/uL (ref 1500–7800)
Neutrophils Relative %: 37.4 %
Platelets: 309 10*3/uL (ref 140–400)
RBC: 4.66 10*6/uL (ref 4.20–5.80)
RDW: 13.1 % (ref 11.0–15.0)
Total Lymphocyte: 52.4 %
WBC: 5.1 10*3/uL (ref 3.8–10.8)

## 2019-12-23 LAB — HIV-1 RNA QUANT-NO REFLEX-BLD
HIV 1 RNA Quant: 86 Copies/mL — ABNORMAL HIGH
HIV-1 RNA Quant, Log: 1.93 Log cps/mL — ABNORMAL HIGH

## 2019-12-23 LAB — LIPID PANEL
Cholesterol: 149 mg/dL (ref <200)
HDL: 43 mg/dL (ref >=40)
LDL Cholesterol (Calc): 84 mg/dL (calc)
Non-HDL Cholesterol (Calc): 106 mg/dL (calc) (ref <130)
Total CHOL/HDL Ratio: 3.5 (calc) (ref <5.0)
Triglycerides: 122 mg/dL (ref <150)

## 2019-12-23 LAB — COMPREHENSIVE METABOLIC PANEL
AG Ratio: 1 (calc) (ref 1.0–2.5)
ALT: 7 U/L — ABNORMAL LOW (ref 9–46)
AST: 14 U/L (ref 10–35)
Albumin: 3.9 g/dL (ref 3.6–5.1)
Alkaline phosphatase (APISO): 61 U/L (ref 35–144)
BUN: 9 mg/dL (ref 7–25)
CO2: 27 mmol/L (ref 20–32)
Calcium: 9.9 mg/dL (ref 8.6–10.3)
Chloride: 100 mmol/L (ref 98–110)
Creat: 0.86 mg/dL (ref 0.70–1.33)
Globulin: 3.8 g/dL (calc) — ABNORMAL HIGH (ref 1.9–3.7)
Glucose, Bld: 102 mg/dL — ABNORMAL HIGH (ref 65–99)
Potassium: 4.6 mmol/L (ref 3.5–5.3)
Sodium: 135 mmol/L (ref 135–146)
Total Bilirubin: 0.3 mg/dL (ref 0.2–1.2)
Total Protein: 7.7 g/dL (ref 6.1–8.1)

## 2019-12-23 LAB — RPR: RPR Ser Ql: NONREACTIVE

## 2019-12-27 ENCOUNTER — Other Ambulatory Visit: Payer: Self-pay | Admitting: Family

## 2019-12-27 DIAGNOSIS — N492 Inflammatory disorders of scrotum: Secondary | ICD-10-CM

## 2020-01-02 ENCOUNTER — Ambulatory Visit
Admission: RE | Admit: 2020-01-02 | Discharge: 2020-01-02 | Disposition: A | Discharge status: Home / Self Care | Payer: Medicare Other | Source: Ambulatory Visit | Attending: Family | Admitting: Family

## 2020-01-02 DIAGNOSIS — N492 Inflammatory disorders of scrotum: Secondary | ICD-10-CM

## 2020-01-15 ENCOUNTER — Other Ambulatory Visit: Payer: Self-pay

## 2020-01-15 ENCOUNTER — Encounter: Payer: Self-pay | Admitting: Internal Medicine

## 2020-01-15 ENCOUNTER — Ambulatory Visit (INDEPENDENT_AMBULATORY_CARE_PROVIDER_SITE_OTHER): Payer: Medicare Other | Admitting: Internal Medicine

## 2020-01-15 VITALS — BP 133/91 | HR 101 | Temp 97.7°F | Wt 244.0 lb

## 2020-01-15 DIAGNOSIS — Z79899 Other long term (current) drug therapy: Secondary | ICD-10-CM

## 2020-01-15 DIAGNOSIS — N492 Inflammatory disorders of scrotum: Secondary | ICD-10-CM

## 2020-01-15 DIAGNOSIS — Z23 Encounter for immunization: Secondary | ICD-10-CM

## 2020-01-15 DIAGNOSIS — B20 Human immunodeficiency virus [HIV] disease: Secondary | ICD-10-CM

## 2020-01-15 NOTE — Progress Notes (Signed)
RFV: follow up for hiv disease   Patient ID: Leon Nichols, male   DOB: 04/26/1965, 54 y.o.   MRN: [id number]  HPI  Leon Nichols is a 54yo M with hiv disease, on triumeq, CD 4 count of 659/VL 86- sometimes switches the times of the day he takes his medications but takes it daily he states. Of late, he has seen his pcp and also appears to have been to the ED for scrotal abscess. Most recently his  Primary doctor started him on abtx - clindamycin plus cephalexin for scrotal abscess, still draining but is waiting to hear results. Supposed to follow up with dr Marlou Porch from urology. He reports it still is draining, some tenderness but improving. Appears to have recurrence. Has follow up with pcp this week. covid vaccine - received J [employer] J vaccine  Outpatient Encounter Medications as of 01/15/2020  Medication Sig  . abacavir-dolutegravir-lamiVUDine (TRIUMEQ) 600-50-300 MG tablet Take 1 tablet by mouth daily.  Marland Kitchen amLODipine (NORVASC) 10 MG tablet Take 1 tablet (10 mg total) by mouth daily.  Marland Kitchen lisinopril (ZESTRIL) 20 MG tablet Take 1 tablet (20 mg total) by mouth daily.  . cephALEXin (KEFLEX) 500 MG capsule Take 500 mg by mouth every 6 (six) hours.  . clindamycin (CLEOCIN) 300 MG capsule Take 300 mg by mouth every 6 (six) hours.  . traMADol (ULTRAM) 50 MG tablet Take 50 mg by mouth every 6 (six) hours as needed.   No facility-administered encounter medications on file as of 01/15/2020.     Patient Active Problem List   Diagnosis Date Noted  . Alcohol dependency ([facility]) 02/20/2019  . Chronic otitis externa of left ear 06/30/2018  . Headache 10/15/2015  . 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 Neuralgia, post-herpetic 01/02/2014  . HIV disease ([facility]) 03/24/2012  . Anemia 03/23/2012  . Essential hypertension 03/29/2007     Health Maintenance Due  Topic Date Due  . COVID-19 Vaccine (1) Never done  . TETANUS/TDAP  Never done  . INFLUENZA VACCINE  11/04/2019      Review of Systems 12 point ros is listed above in hpi, otherwise negative Physical Exam   BP (!) 133/91   Pulse (!) 101   Temp 97.7 F (36.5 C) (Oral)   Wt 244 lb (110.7 kg)   BMI 32.19 kg/m    Physical Exam  Constitutional: He is oriented to person, place, and time. He appears well-developed and well-nourished. No distress.  HENT:  Mouth/Throat: Oropharynx is clear and moist. No oropharyngeal exudate.  Cardiovascular: Normal rate, regular rhythm and normal heart sounds. Exam reveals no gallop and no friction rub.  No murmur heard.  Pulmonary/Chest: Effort normal and breath sounds normal. No respiratory distress. He has no wheezes.  Abdominal: Soft. Bowel sounds are normal. He exhibits no distension. There is no tenderness.  Lymphadenopathy:  He has no cervical adenopathy.  Neurological: He is alert and oriented to person, place, and time.  Skin: Skin is warm and dry. No rash noted. No erythema.  Psychiatric: He has a normal mood and affect. His behavior is normal.    -indurated. Right sides of scrotum Lab Results  Component Value Date   CD4TCELL 28 (L) 12/19/2019   Lab Results  Component Value Date   CD4TABS 659 12/19/2019   CD4TABS 681 04/09/2019   CD4TABS 464 01/18/2019   Lab Results  Component Value Date   HIV1RNAQUANT 86 (H) 12/19/2019   No results found for: HEPBSAB Lab Results  Component  Value Date   LABRPR NON-REACTIVE 12/19/2019    CBC Lab Results  Component Value Date   WBC 5.1 12/19/2019   RBC 4.66 12/19/2019   HGB 14.4 12/19/2019   HCT 43.1 12/19/2019   PLT 309 12/19/2019   MCV 92.5 12/19/2019   MCH 30.9 12/19/2019   MCHC 33.4 12/19/2019   RDW 13.1 12/19/2019   LYMPHSABS 2,672 12/19/2019   MONOABS 0.4 09/02/2019   EOSABS 61 12/19/2019    BMET Lab Results  Component Value Date   NA 135 12/19/2019   K 4.6 12/19/2019   CL 100 12/19/2019   CO2 27 12/19/2019   GLUCOSE 102 (H) 12/19/2019   BUN 9 12/19/2019   CREATININE 0.86  12/19/2019   CALCIUM 9.9 12/19/2019   GFRNONAA >60 09/02/2019   GFRAA >60 09/02/2019      Assessment and Plan  hiv disease = has low level viremia- which could be due to lab processing error. Discussed better adherence. Will repeat VL in 3 months.  Long term medication management = cr is stable. Continue on triumeq  Health maintenance = will give Flu shot  Continue on the abtx for scrotal abscess. Follow up with pcp, appears improving. Still would see urology

## 2020-04-17 ENCOUNTER — Ambulatory Visit: Payer: Medicare Other | Admitting: Internal Medicine

## 2020-05-08 ENCOUNTER — Ambulatory Visit (INDEPENDENT_AMBULATORY_CARE_PROVIDER_SITE_OTHER): Payer: Medicare Other | Admitting: Internal Medicine

## 2020-05-08 ENCOUNTER — Encounter: Payer: Self-pay | Admitting: Internal Medicine

## 2020-05-08 ENCOUNTER — Other Ambulatory Visit: Payer: Self-pay

## 2020-05-08 VITALS — BP 142/80 | HR 85 | Temp 98.2°F | Ht 73.0 in | Wt 263.0 lb

## 2020-05-08 DIAGNOSIS — Z79899 Other long term (current) drug therapy: Secondary | ICD-10-CM | POA: Diagnosis not present

## 2020-05-08 DIAGNOSIS — I10 Essential (primary) hypertension: Secondary | ICD-10-CM

## 2020-05-08 DIAGNOSIS — F1020 Alcohol dependence, uncomplicated: Secondary | ICD-10-CM

## 2020-05-08 DIAGNOSIS — B20 Human immunodeficiency virus [HIV] disease: Secondary | ICD-10-CM

## 2020-05-08 MED ORDER — TRIUMEQ 600-50-300 MG PO TABS: 1.0000 | ORAL_TABLET | Freq: Every day | ORAL | 11 refills | Status: DC

## 2020-05-08 NOTE — Progress Notes (Unsigned)
RFV: follow up for hiv disease  Patient ID: Leon Nichols, male   DOB: 10-Mar-1966, 55 y.o.   MRN: [id number]  HPI Leon Nichols is a 55yo M with history of well controlled hiv disease. He states he takes triumeq routinely. Not missing doses. He keeps to himself, and continues to wear mask in public. he Has not covid illness. Received booster last month Needs refill for triumeq  Sochx: 6 beers per day  Outpatient Encounter Medications as of 05/08/2020  Medication Sig  . abacavir-dolutegravir-lamiVUDine (TRIUMEQ) 600-50-300 MG tablet Take 1 tablet by mouth daily.  Marland Kitchen amLODipine (NORVASC) 10 MG tablet Take 1 tablet (10 mg total) by mouth daily.  Marland Kitchen lisinopril (ZESTRIL) 20 MG tablet Take 1 tablet (20 mg total) by mouth daily.  . clindamycin (CLINDAGEL) 1 % gel Apply topically 2 (two) times daily. (Patient not taking: Reported on 05/08/2020)  . mupirocin ointment (BACTROBAN) 2 % SMARTSIG:Sparingly Topical 3 Times Daily (Patient not taking: Reported on 05/08/2020)  . [DISCONTINUED] cephALEXin (KEFLEX) 500 MG capsule Take 500 mg by mouth every 6 (six) hours. (Patient not taking: Reported on 05/08/2020)  . [DISCONTINUED] clindamycin (CLEOCIN) 300 MG capsule Take 300 mg by mouth every 6 (six) hours. (Patient not taking: Reported on 05/08/2020)  . [DISCONTINUED] traMADol (ULTRAM) 50 MG tablet Take 50 mg by mouth every 6 (six) hours as needed.   No facility-administered encounter medications on file as of 05/08/2020.     Patient Active Problem List   Diagnosis Date Noted  . Alcohol dependency ([facility]) 02/20/2019  . Chronic otitis externa of left ear 06/30/2018  . Headache 10/15/2015  . 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 Neuralgia, post-herpetic 01/02/2014  . HIV disease ([facility]) 03/24/2012  . Anemia 03/23/2012  . Essential hypertension 03/29/2007     Health Maintenance Due  Topic Date Due  . COVID-19 Vaccine (1) Never done  . TETANUS/TDAP  Never done     Review of  Systems Has gained weight in winter.  Physical Exam   BP (!) 142/80   Pulse 85   Temp 98.2 F (36.8 C) (Oral)   Ht 6\' 1"  (1.854 m)   Wt 263 lb (119.3 kg)   SpO2 95%   BMI 34.70 kg/m   Physical Exam  Constitutional: He is oriented to person, place, and time. He appears well-developed and well-nourished. No distress.  HENT:  Mouth/Throat: Oropharynx is clear and moist. No oropharyngeal exudate.  Cardiovascular: Normal rate, regular rhythm and normal heart sounds. Exam reveals no gallop and no friction rub.  No murmur heard.  Pulmonary/Chest: Effort normal and breath sounds normal. No respiratory distress. He has no wheezes.  Abdominal: Soft. Bowel sounds are normal. He exhibits no distension. Ostomy in place Lymphadenopathy:  He has no cervical adenopathy.  Neurological: He is alert and oriented to person, place, and time.  Skin: Skin is warm and dry. No rash noted. No erythema.  Psychiatric: He has a normal mood and affect. His behavior is normal.    Lab Results  Component Value Date   CD4TCELL 28 (L) 12/19/2019   Lab Results  Component Value Date   CD4TABS 659 12/19/2019   CD4TABS 681 04/09/2019   CD4TABS 464 01/18/2019   Lab Results  Component Value Date   HIV1RNAQUANT 86 (H) 12/19/2019   No results found for: HEPBSAB Lab Results  Component Value Date   LABRPR NON-REACTIVE 12/19/2019    CBC Lab Results  Component Value Date   WBC 5.1 12/19/2019  RBC 4.66 12/19/2019   HGB 14.4 12/19/2019   HCT 43.1 12/19/2019   PLT 309 12/19/2019   MCV 92.5 12/19/2019   MCH 30.9 12/19/2019   MCHC 33.4 12/19/2019   RDW 13.1 12/19/2019   LYMPHSABS 2,672 12/19/2019   MONOABS 0.4 09/02/2019   EOSABS 61 12/19/2019    BMET Lab Results  Component Value Date   NA 135 12/19/2019   K 4.6 12/19/2019   CL 100 12/19/2019   CO2 27 12/19/2019   GLUCOSE 102 (H) 12/19/2019   BUN 9 12/19/2019   CREATININE 0.86 12/19/2019   CALCIUM 9.9 12/19/2019   GFRNONAA >60 09/02/2019    GFRAA >60 09/02/2019     Assessment and Plan  HIV disease = will check his VL and CD 4 count to see that he is still well controlled. Will give refills for triumeq  Long term medication management = will check cr  hypertension = not at goal, will check again at next visit to see if BP meds need adjustment  hx of ostomy (2013 by dr Jamey Ripa) = reports that he was supposed to follow up but his surgeon has since retired. He is Interested in evaluation for potential for reversal. No hx of crohns. We will refer back to [place] surgery  Alcohol dependence = discussed cutting back or programs.  Health maintenance = uptodate on vaccines. Except Needs colonoscopy for colon cancer screening but also has left sided ostomy.

## 2020-05-09 LAB — T-HELPER CELL (CD4) - (RCID CLINIC ONLY)
CD4 % Helper T Cell: 25 % — ABNORMAL LOW (ref 33–65)
CD4 T Cell Abs: 721 /uL (ref 400–1790)

## 2020-05-12 LAB — COMPLETE METABOLIC PANEL WITH GFR
AG Ratio: 1 (calc) (ref 1.0–2.5)
ALT: 9 U/L (ref 9–46)
AST: 13 U/L (ref 10–35)
Albumin: 3.9 g/dL (ref 3.6–5.1)
Alkaline phosphatase (APISO): 77 U/L (ref 35–144)
BUN: 8 mg/dL (ref 7–25)
CO2: 26 mmol/L (ref 20–32)
Calcium: 9.2 mg/dL (ref 8.6–10.3)
Chloride: 100 mmol/L (ref 98–110)
Creat: 0.78 mg/dL (ref 0.70–1.33)
GFR, Est African American: 120 mL/min/{1.73_m2} (ref >=60)
GFR, Est Non African American: 104 mL/min/{1.73_m2} (ref >=60)
Globulin: 3.8 g/dL (calc) — ABNORMAL HIGH (ref 1.9–3.7)
Glucose, Bld: 130 mg/dL — ABNORMAL HIGH (ref 65–99)
Potassium: 4.4 mmol/L (ref 3.5–5.3)
Sodium: 134 mmol/L — ABNORMAL LOW (ref 135–146)
Total Bilirubin: 0.3 mg/dL (ref 0.2–1.2)
Total Protein: 7.7 g/dL (ref 6.1–8.1)

## 2020-05-12 LAB — LIPID PANEL
Cholesterol: 139 mg/dL (ref <200)
HDL: 42 mg/dL (ref >=40)
LDL Cholesterol (Calc): 75 mg/dL (calc)
Non-HDL Cholesterol (Calc): 97 mg/dL (calc) (ref <130)
Total CHOL/HDL Ratio: 3.3 (calc) (ref <5.0)
Triglycerides: 141 mg/dL (ref <150)

## 2020-05-12 LAB — CBC WITH DIFFERENTIAL/PLATELET
Absolute Monocytes: 560 cells/uL (ref 200–950)
Basophils Absolute: 21 cells/uL (ref 0–200)
Basophils Relative: 0.3 %
Eosinophils Absolute: 133 cells/uL (ref 15–500)
Eosinophils Relative: 1.9 %
HCT: 41.4 % (ref 38.5–50.0)
Hemoglobin: 14.2 g/dL (ref 13.2–17.1)
Lymphs Abs: 3017 cells/uL (ref 850–3900)
MCH: 30.5 pg (ref 27.0–33.0)
MCHC: 34.3 g/dL (ref 32.0–36.0)
MCV: 89 fL (ref 80.0–100.0)
MPV: 9.3 fL (ref 7.5–12.5)
Monocytes Relative: 8 %
Neutro Abs: 3269 cells/uL (ref 1500–7800)
Neutrophils Relative %: 46.7 %
Platelets: 324 10*3/uL (ref 140–400)
RBC: 4.65 10*6/uL (ref 4.20–5.80)
RDW: 13.7 % (ref 11.0–15.0)
Total Lymphocyte: 43.1 %
WBC: 7 10*3/uL (ref 3.8–10.8)

## 2020-05-12 LAB — HIV-1 RNA QUANT-NO REFLEX-BLD
HIV 1 RNA Quant: 23 Copies/mL — ABNORMAL HIGH
HIV-1 RNA Quant, Log: 1.36 Log cps/mL — ABNORMAL HIGH

## 2020-05-12 LAB — RPR: RPR Ser Ql: NONREACTIVE

## 2020-05-16 ENCOUNTER — Other Ambulatory Visit: Payer: Self-pay | Admitting: General Surgery

## 2020-05-16 DIAGNOSIS — K603 Anal fistula: Secondary | ICD-10-CM

## 2020-05-16 DIAGNOSIS — K610 Anal abscess: Secondary | ICD-10-CM

## 2020-05-16 DIAGNOSIS — Z933 Colostomy status: Secondary | ICD-10-CM

## 2020-06-08 ENCOUNTER — Other Ambulatory Visit: Payer: Medicare Other

## 2020-06-23 ENCOUNTER — Other Ambulatory Visit: Payer: Self-pay | Admitting: Internal Medicine

## 2020-06-29 ENCOUNTER — Other Ambulatory Visit: Payer: Medicare Other

## 2020-07-08 ENCOUNTER — Other Ambulatory Visit: Payer: Self-pay

## 2020-07-08 ENCOUNTER — Telehealth (INDEPENDENT_AMBULATORY_CARE_PROVIDER_SITE_OTHER): Payer: Medicare Other | Admitting: Internal Medicine

## 2020-07-08 DIAGNOSIS — Z79899 Other long term (current) drug therapy: Secondary | ICD-10-CM | POA: Diagnosis not present

## 2020-07-08 DIAGNOSIS — B20 Human immunodeficiency virus [HIV] disease: Secondary | ICD-10-CM

## 2020-07-08 NOTE — Progress Notes (Signed)
Virtual Visit via Telephone Note  I connected with Leon Nichols on 07/08/20 at  4:15 PM EDT by telephone and verified that I am speaking with the correct person using two identifiers.  Location: Patient: house Provider: in clinic   I discussed the limitations, risks, security and privacy concerns of performing an evaluation and management service by telephone and the availability of in person appointments. I also discussed with the patient that there may be a patient responsible charge related to this service. The patient expressed understanding and agreed to proceed.   History of Present Illness:  He reports taking his medications but missing doses due to housing instability. He is  Staying at friend's home; trying to find place to live. He is uptodate on covid vaccine x 3 doses.  Soc hx: still drinking    Observations/Objective: Fluid speech during our discussion. A x o by 3 in NAD. Answers questions appropriately Current Outpatient Medications  Medication Instructions  . abacavir-dolutegravir-lamiVUDine (TRIUMEQ) 600-50-300 MG tablet 1 tablet, Oral, Daily  . amLODipine (NORVASC) 10 MG tablet TAKE 1 TABLET(10 MG) BY MOUTH DAILY  . clindamycin (CLINDAGEL) 1 % gel 2 times daily  . lisinopril (ZESTRIL) 20 MG tablet TAKE 1 TABLET(20 MG) BY MOUTH DAILY  . mupirocin ointment (BACTROBAN) 2 % SMARTSIG:Sparingly Topical 3 Times Daily   Assessment and Plan:  hiv disease = continue with triumeq, would like him to come to clinic for lab work to see that he is undetectable  L;ong term medication management = will check cr at that time  Alcohol dependence = recommend to consider decreasing intake   Follow Up Instructions:    I discussed the assessment and treatment plan with the patient. The patient was provided an opportunity to ask questions and all were answered. The patient agreed with the plan and demonstrated an understanding of the instructions.   The patient was advised to call  back or seek an in-person evaluation if the symptoms worsen or if the condition fails to improve as anticipated.  I provided 10 minutes of non-face-to-face time during this encounter.   [name], MD

## 2020-08-13 ENCOUNTER — Telehealth: Payer: Self-pay

## 2020-08-13 NOTE — Telephone Encounter (Signed)
Patient called office today requesting assistance getting colostomy bags. States that PCP will not provide orders for supplies until he is seen. Informed patient that we do not provider orders for this and that he will need to see PCP for this.  Requested he schedule appt with them this week. Juanita Laster, RMA

## 2020-09-18 ENCOUNTER — Other Ambulatory Visit: Payer: Self-pay | Admitting: Internal Medicine

## 2020-12-17 ENCOUNTER — Other Ambulatory Visit: Payer: Self-pay | Admitting: Internal Medicine

## 2020-12-18 ENCOUNTER — Other Ambulatory Visit: Payer: Self-pay | Admitting: Internal Medicine

## 2021-01-28 ENCOUNTER — Other Ambulatory Visit: Payer: Self-pay

## 2021-01-28 ENCOUNTER — Other Ambulatory Visit (HOSPITAL_COMMUNITY)
Admission: RE | Admit: 2021-01-28 | Discharge: 2021-01-28 | Disposition: A | Discharge status: Home / Self Care | Payer: Medicare Other | Source: Ambulatory Visit | Attending: Infectious Diseases | Admitting: Infectious Diseases

## 2021-01-28 ENCOUNTER — Encounter: Payer: Self-pay | Admitting: Infectious Diseases

## 2021-01-28 ENCOUNTER — Ambulatory Visit (INDEPENDENT_AMBULATORY_CARE_PROVIDER_SITE_OTHER): Payer: Medicare Other | Admitting: Infectious Diseases

## 2021-01-28 VITALS — BP 136/81 | HR 120 | Temp 99.5°F | Wt 230.0 lb

## 2021-01-28 DIAGNOSIS — Z23 Encounter for immunization: Secondary | ICD-10-CM | POA: Diagnosis not present

## 2021-01-28 DIAGNOSIS — B20 Human immunodeficiency virus [HIV] disease: Secondary | ICD-10-CM | POA: Diagnosis not present

## 2021-01-28 DIAGNOSIS — Z113 Encounter for screening for infections with a predominantly sexual mode of transmission: Secondary | ICD-10-CM

## 2021-01-28 MED ORDER — TRIUMEQ 600-50-300 MG PO TABS: 1.0000 | ORAL_TABLET | Freq: Every day | ORAL | 5 refills | Status: DC

## 2021-01-28 NOTE — Progress Notes (Addendum)
[address], [place], [place], [address]                                                                  Phn. [phone]; Fax: [phone]                                                                             Date: 01/28/21  Reason for Visit: HIV follow up   HPI: Leon Nichols is a 55 y.o.old male with a history of HIVon Triumeq who is here for follow up. Last CD4 25% (721) and VL 23 in 05/2020.   Taking Triumeq every day without any issues. Denies missed doses. He takes every morning at 10 am. Denies any new medications except OTC Nyquil. Denies being sexually active.  Ex smoker, Has quit alcohol recently. Denies IVDU. He is willing to get Flu vaccine today. He has a PCP with him he follows up for HTN. Discussed  about colon can screening . He tells he had last colonoscopy almost 10 years ago and is working on having the colostomy reversed.   Complains of feeling dizzy at times when he suddenly stands up from sitting in the bed, esp in the morning and also while trying to pick up things. ? Orthostatic hypotension. He will discuss with his PCP regarding need to adjust BP meds. Denies any headache, blurry vision, fevers, chills and sweat. He would like to try ensure for his nutrition.   ROS: Denies dysphagia, odynophagia, cough, fever, nausea, vomiting, diarrhea, constipation, weight loss, chills, night sweats, recent hospitalizations, rashes, joint complaints, shortness of breath, headaches, chest pain, abdominal pain, dysuria .  Current Outpatient Medications on File Prior to Visit  Medication Sig Dispense Refill   amLODipine (NORVASC) 10 MG tablet TAKE 1 TABLET(10 MG) BY MOUTH DAILY 30 tablet 0   lisinopril (ZESTRIL) 20 MG tablet TAKE 1 TABLET(20 MG) BY MOUTH DAILY 30 tablet 0   No  current facility-administered medications on file prior to visit.                         No Known Allergies  Past Medical History:  Diagnosis Date   Acute back pain 10/28/2016   Colitis    Diverticulosis    Hemorrhoids    History of shingles    HIV (human immunodeficiency virus infection) (HCC) 03/24/2012   Hypertension    Perianal abscess 02/06/2015   Perirectal abscess    Septic arthritis (HCC) 02/05/2015   Past Surgical History:  Procedure Laterality Date   arthroscopic wash out of right knee     COLONOSCOPY  08/31/2011   Procedure: COLONOSCOPY;  Surgeon: Hilarie Fredrickson, MD;  Location: [facility] ENDOSCOPY;  Service: Endoscopy;  Laterality: N/A;   DRESSING CHANGE UNDER ANESTHESIA  03/27/2012   Procedure: DRESSING CHANGE UNDER ANESTHESIA;  Surgeon: Currie Paris, MD;  Location: [facility] OR;  Service: General;  Laterality: N/A;   INCISION AND DRAINAGE PERIRECTAL ABSCESS  03/23/2012   Procedure: IRRIGATION AND DEBRIDEMENT PERIRECTAL ABSCESS;  Surgeon: Mariella Saa, MD;  Location: [facility] OR;  Service: General;  Laterality: N/A;  I & D soft tissue scrotal and perineal abscess   KNEE ARTHROSCOPY Right 02/05/2015   Procedure: ARTHROSCOPIC [facility] OUT RIGHT KNEE;  Surgeon: Sheral Apley, MD;  Location: [facility] OR;  Service: Orthopedics;  Laterality: Right;   LAPAROSCOPIC DIVERTED COLOSTOMY  03/27/2012   Procedure: LAPAROSCOPIC DIVERTED COLOSTOMY;  Surgeon: Currie Paris, MD;  Location: [facility] OR;  Service: General;  Laterality: N/A;   Social History   Socioeconomic History   Marital status: Single    Spouse name: Not on file   Number of children: Not on file   Years of education: Not on file   Highest education level: Not on file  Occupational History   Not on file  Tobacco Use   Smoking status: Former    Packs/day: 0.25    Years: 15.00    Pack years: 3.75    Types: Cigarettes   Smokeless tobacco: Never   Tobacco comments:    less than 5 cigarettes a day  Vaping Use   Vaping Use: Never  used  Substance and Sexual Activity   Alcohol use: Yes    Alcohol/week: 50.0 standard drinks    Types: 50 Cans of beer per week    Comment: 6-7 beers a day   Drug use: Not Currently    Types: Marijuana, Cocaine   Sexual activity: Not Currently    Partners: Female    Birth control/protection: Condom    Comment: declined condoms  Other Topics Concern   Not on file  Social History Narrative   Not on file   Social Determinants of Health   Financial Resource Strain: Not on file  Food Insecurity: Not on file  Transportation Needs: Not on file  Physical Activity: Not on file  Stress: Not on file  Social Connections: Not on file  Intimate Partner Violence: Not on file   Family History  Problem Relation Age of Onset   Hypertension Mother    Diabetes Mother   ,  Vitals  BP 136/81   Pulse (!) 120   Temp 99.5 F (37.5 C) (Oral)   Wt 230 lb (104.3 kg)   SpO2 99%   BMI 30.34 kg/m    Examination  Gen: Alert and oriented x 3, no acute distress HEENT: [place]/AT, no scleral icterus, no pale conjunctivae, hearing normal, oral mucosa moist Neck: Supple Cardio: Regular rate and rhythm; +S1 and S2 Resp: CTAB GI: Soft, nontender, nondistended GU: Musc: Extremities: No cyanosis, clubbing, or edema Skin: No rashes, lesions, or ecchymoses Neuro: grossly non focal  Psych: Calm, cooperative    Lab Results HIV 1 RNA Quant  Date Value  05/08/2020 23 Copies/mL (H)  12/19/2019 86 Copies/mL (H)  04/09/2019 47 copies/mL (H)   CD4 T Cell Abs (/uL)  Date Value  05/08/2020 721  12/19/2019 659  04/09/2019 681   Lab Results  Component Value Date   HIV1GENOSEQ REPORT 12/03/2013   Lab Results  Component Value Date   WBC 7.0 05/08/2020   HGB 14.2 05/08/2020  HCT 41.4 05/08/2020   MCV 89.0 05/08/2020   PLT 324 05/08/2020    Lab Results  Component Value Date   CREATININE 0.78 05/08/2020   BUN 8 05/08/2020   NA 134 (L) 05/08/2020   K 4.4 05/08/2020   CL 100 05/08/2020    CO2 26 05/08/2020   Lab Results  Component Value Date   ALT 9 05/08/2020   AST 13 05/08/2020   ALKPHOS 84 09/02/2019   BILITOT 0.3 05/08/2020    Lab Results  Component Value Date   CHOL 139 05/08/2020   TRIG 141 05/08/2020   HDL 42 05/08/2020   LDLCALC 75 05/08/2020   Lab Results  Component Value Date   HAV NEG 07/27/2012   Lab Results  Component Value Date   HEPBSAG NEGATIVE 03/23/2012   Lab Results  Component Value Date   HCVAB NEGATIVE 03/23/2012   Lab Results  Component Value Date   CHLAMYDIAWP Negative 12/19/2019   N Negative 12/19/2019   Lab Results  Component Value Date   GCPROBEAPT NEGATIVE 03/23/2012   No results found for: QUANTGOLD    Health Maintenance: Immunization History  Administered Date(s) Administered   Hepatitis B 04/25/2012   Influenza Split 03/25/2012   Influenza,inj,Quad PF,6+ Mos 12/03/2013, 02/07/2015, 01/15/2020   Pneumococcal Conjugate-13 04/18/2019   Pneumococcal Polysaccharide-23 03/25/2012    Assessment/Plan: HIV well controlled Continue Triumeq Meds refilled  Labs today  Fu in 5 months   Alcohol use Congratulated on quitting recently  STD Screening  Urine GC and RPR  #Immunization  Discussed about recommended vaccines in [facility] Willing to get Flu vaccine today   #Health maintenance - Discussed about colon ca screening  - He has a PCP  - Discussed about Dental care and Dental referral   Patient's labs were reviewed as well as his previous records. Patients questions were addressed and answered. Safe sex counseling done.   I have personally spent 60 minutes involved in face-to-face and non-face-to-face activities for this patient on the day of the visit.   Electronically signed by:  Odette Fraction, MD Infectious Disease Physician [facility] [address] [place], [place] [address] Phone: [phone]  Fax: [phone]

## 2021-01-30 LAB — URINE CYTOLOGY ANCILLARY ONLY
Chlamydia: NEGATIVE
Comment: NEGATIVE
Comment: NORMAL
Neisseria Gonorrhea: NEGATIVE

## 2021-02-02 ENCOUNTER — Other Ambulatory Visit (HOSPITAL_COMMUNITY): Payer: Self-pay

## 2021-02-02 LAB — RPR: RPR Ser Ql: NONREACTIVE

## 2021-02-02 LAB — HIV-1 RNA ULTRAQUANT REFLEX TO GENTYP+
HIV 1 RNA Quant: 174 copies/mL — ABNORMAL HIGH
HIV-1 RNA Quant, Log: 2.24 Log copies/mL — ABNORMAL HIGH

## 2021-02-25 ENCOUNTER — Emergency Department (HOSPITAL_COMMUNITY): Payer: Medicare Other

## 2021-02-25 ENCOUNTER — Inpatient Hospital Stay (HOSPITAL_COMMUNITY)
Admission: EM | Admit: 2021-02-25 | Discharge: 2021-03-06 | DRG: 175 | Disposition: A | Discharge status: Home Health | Payer: Medicare Other | Attending: Internal Medicine | Admitting: Internal Medicine

## 2021-02-25 DIAGNOSIS — I2609 Other pulmonary embolism with acute cor pulmonale: Secondary | ICD-10-CM

## 2021-02-25 DIAGNOSIS — Z79899 Other long term (current) drug therapy: Secondary | ICD-10-CM

## 2021-02-25 DIAGNOSIS — I82409 Acute embolism and thrombosis of unspecified deep veins of unspecified lower extremity: Secondary | ICD-10-CM | POA: Diagnosis present

## 2021-02-25 DIAGNOSIS — M1990 Unspecified osteoarthritis, unspecified site: Secondary | ICD-10-CM | POA: Diagnosis present

## 2021-02-25 DIAGNOSIS — J189 Pneumonia, unspecified organism: Secondary | ICD-10-CM

## 2021-02-25 DIAGNOSIS — I2699 Other pulmonary embolism without acute cor pulmonale: Secondary | ICD-10-CM | POA: Diagnosis not present

## 2021-02-25 DIAGNOSIS — R8281 Pyuria: Secondary | ICD-10-CM | POA: Diagnosis present

## 2021-02-25 DIAGNOSIS — Z20822 Contact with and (suspected) exposure to covid-19: Secondary | ICD-10-CM | POA: Diagnosis present

## 2021-02-25 DIAGNOSIS — R195 Other fecal abnormalities: Secondary | ICD-10-CM | POA: Diagnosis present

## 2021-02-25 DIAGNOSIS — N179 Acute kidney failure, unspecified: Secondary | ICD-10-CM

## 2021-02-25 DIAGNOSIS — Z8249 Family history of ischemic heart disease and other diseases of the circulatory system: Secondary | ICD-10-CM

## 2021-02-25 DIAGNOSIS — R319 Hematuria, unspecified: Secondary | ICD-10-CM

## 2021-02-25 DIAGNOSIS — D638 Anemia in other chronic diseases classified elsewhere: Secondary | ICD-10-CM | POA: Diagnosis present

## 2021-02-25 DIAGNOSIS — I10 Essential (primary) hypertension: Secondary | ICD-10-CM

## 2021-02-25 DIAGNOSIS — R0602 Shortness of breath: Secondary | ICD-10-CM | POA: Diagnosis not present

## 2021-02-25 DIAGNOSIS — R310 Gross hematuria: Secondary | ICD-10-CM

## 2021-02-25 DIAGNOSIS — Z6830 Body mass index (BMI) 30.0-30.9, adult: Secondary | ICD-10-CM

## 2021-02-25 DIAGNOSIS — Z933 Colostomy status: Secondary | ICD-10-CM

## 2021-02-25 DIAGNOSIS — Z87891 Personal history of nicotine dependence: Secondary | ICD-10-CM

## 2021-02-25 DIAGNOSIS — D649 Anemia, unspecified: Secondary | ICD-10-CM | POA: Diagnosis present

## 2021-02-25 DIAGNOSIS — Z833 Family history of diabetes mellitus: Secondary | ICD-10-CM

## 2021-02-25 DIAGNOSIS — J9601 Acute respiratory failure with hypoxia: Secondary | ICD-10-CM | POA: Diagnosis present

## 2021-02-25 DIAGNOSIS — E669 Obesity, unspecified: Secondary | ICD-10-CM | POA: Diagnosis present

## 2021-02-25 DIAGNOSIS — R609 Edema, unspecified: Secondary | ICD-10-CM

## 2021-02-25 DIAGNOSIS — E871 Hypo-osmolality and hyponatremia: Secondary | ICD-10-CM | POA: Diagnosis present

## 2021-02-25 DIAGNOSIS — D62 Acute posthemorrhagic anemia: Secondary | ICD-10-CM | POA: Diagnosis present

## 2021-02-25 DIAGNOSIS — K649 Unspecified hemorrhoids: Secondary | ICD-10-CM | POA: Diagnosis present

## 2021-02-25 DIAGNOSIS — B20 Human immunodeficiency virus [HIV] disease: Secondary | ICD-10-CM | POA: Diagnosis present

## 2021-02-25 NOTE — ED Triage Notes (Signed)
Patient reports shobr, coughing, ble edema x 2 weeks. Denies cardiac history. Pain  4/10 in feet.

## 2021-02-26 ENCOUNTER — Encounter (HOSPITAL_COMMUNITY): Payer: Self-pay

## 2021-02-26 ENCOUNTER — Inpatient Hospital Stay (HOSPITAL_COMMUNITY): Payer: Medicare Other

## 2021-02-26 ENCOUNTER — Emergency Department (HOSPITAL_COMMUNITY): Payer: Medicare Other

## 2021-02-26 DIAGNOSIS — R195 Other fecal abnormalities: Secondary | ICD-10-CM | POA: Diagnosis not present

## 2021-02-26 DIAGNOSIS — I2609 Other pulmonary embolism with acute cor pulmonale: Secondary | ICD-10-CM

## 2021-02-26 DIAGNOSIS — B20 Human immunodeficiency virus [HIV] disease: Secondary | ICD-10-CM | POA: Diagnosis present

## 2021-02-26 DIAGNOSIS — M1990 Unspecified osteoarthritis, unspecified site: Secondary | ICD-10-CM | POA: Diagnosis present

## 2021-02-26 DIAGNOSIS — Z6830 Body mass index (BMI) 30.0-30.9, adult: Secondary | ICD-10-CM | POA: Diagnosis not present

## 2021-02-26 DIAGNOSIS — D62 Acute posthemorrhagic anemia: Secondary | ICD-10-CM | POA: Diagnosis present

## 2021-02-26 DIAGNOSIS — I10 Essential (primary) hypertension: Secondary | ICD-10-CM | POA: Diagnosis present

## 2021-02-26 DIAGNOSIS — J9601 Acute respiratory failure with hypoxia: Secondary | ICD-10-CM | POA: Diagnosis present

## 2021-02-26 DIAGNOSIS — I2699 Other pulmonary embolism without acute cor pulmonale: Secondary | ICD-10-CM | POA: Diagnosis present

## 2021-02-26 DIAGNOSIS — Z8249 Family history of ischemic heart disease and other diseases of the circulatory system: Secondary | ICD-10-CM | POA: Diagnosis not present

## 2021-02-26 DIAGNOSIS — E669 Obesity, unspecified: Secondary | ICD-10-CM | POA: Diagnosis present

## 2021-02-26 DIAGNOSIS — D649 Anemia, unspecified: Secondary | ICD-10-CM

## 2021-02-26 DIAGNOSIS — R8281 Pyuria: Secondary | ICD-10-CM | POA: Diagnosis present

## 2021-02-26 DIAGNOSIS — Z833 Family history of diabetes mellitus: Secondary | ICD-10-CM | POA: Diagnosis not present

## 2021-02-26 DIAGNOSIS — N179 Acute kidney failure, unspecified: Secondary | ICD-10-CM | POA: Diagnosis present

## 2021-02-26 DIAGNOSIS — K649 Unspecified hemorrhoids: Secondary | ICD-10-CM | POA: Diagnosis present

## 2021-02-26 DIAGNOSIS — Z933 Colostomy status: Secondary | ICD-10-CM | POA: Diagnosis not present

## 2021-02-26 DIAGNOSIS — J189 Pneumonia, unspecified organism: Secondary | ICD-10-CM | POA: Diagnosis present

## 2021-02-26 DIAGNOSIS — R0602 Shortness of breath: Secondary | ICD-10-CM | POA: Diagnosis present

## 2021-02-26 DIAGNOSIS — K922 Gastrointestinal hemorrhage, unspecified: Secondary | ICD-10-CM | POA: Diagnosis not present

## 2021-02-26 DIAGNOSIS — I82409 Acute embolism and thrombosis of unspecified deep veins of unspecified lower extremity: Secondary | ICD-10-CM | POA: Diagnosis present

## 2021-02-26 DIAGNOSIS — Z87891 Personal history of nicotine dependence: Secondary | ICD-10-CM | POA: Diagnosis not present

## 2021-02-26 DIAGNOSIS — Z79899 Other long term (current) drug therapy: Secondary | ICD-10-CM | POA: Diagnosis not present

## 2021-02-26 DIAGNOSIS — R310 Gross hematuria: Secondary | ICD-10-CM | POA: Diagnosis present

## 2021-02-26 DIAGNOSIS — D638 Anemia in other chronic diseases classified elsewhere: Secondary | ICD-10-CM | POA: Diagnosis present

## 2021-02-26 DIAGNOSIS — E871 Hypo-osmolality and hyponatremia: Secondary | ICD-10-CM | POA: Diagnosis present

## 2021-02-26 DIAGNOSIS — Z20822 Contact with and (suspected) exposure to covid-19: Secondary | ICD-10-CM | POA: Diagnosis present

## 2021-02-26 LAB — BLOOD GAS, ARTERIAL
Acid-base deficit: 7.5 mmol/L — ABNORMAL HIGH (ref 0.0–2.0)
Bicarbonate: 17.3 mmol/L — ABNORMAL LOW (ref 20.0–28.0)
FIO2: 0.21
O2 Saturation: 39.4 %
Patient temperature: 98.6
pCO2 arterial: 34.2 mmHg (ref 32.0–48.0)
pH, Arterial: 7.325 — ABNORMAL LOW (ref 7.350–7.450)
pO2, Arterial: 29 mmHg — CL (ref 83.0–108.0)

## 2021-02-26 LAB — CBC
HCT: 17 % — ABNORMAL LOW (ref 39.0–52.0)
Hemoglobin: 5.1 g/dL — CL (ref 13.0–17.0)
MCH: 27.1 pg (ref 26.0–34.0)
MCHC: 30 g/dL (ref 30.0–36.0)
MCV: 90.4 fL (ref 80.0–100.0)
Platelets: 445 10*3/uL — ABNORMAL HIGH (ref 150–400)
RBC: 1.88 MIL/uL — ABNORMAL LOW (ref 4.22–5.81)
RDW: 19.7 % — ABNORMAL HIGH (ref 11.5–15.5)
WBC: 13.1 10*3/uL — ABNORMAL HIGH (ref 4.0–10.5)
nRBC: 0 % (ref 0.0–0.2)

## 2021-02-26 LAB — URINALYSIS, ROUTINE W REFLEX MICROSCOPIC
Bacteria, UA: NONE SEEN
Bilirubin Urine: NEGATIVE
Glucose, UA: NEGATIVE mg/dL
Ketones, ur: NEGATIVE mg/dL
Leukocytes,Ua: NEGATIVE
Nitrite: NEGATIVE
Protein, ur: 30 mg/dL — AB
Specific Gravity, Urine: 1.018 (ref 1.005–1.030)
pH: 5 (ref 5.0–8.0)

## 2021-02-26 LAB — COMPREHENSIVE METABOLIC PANEL
ALT: 7 U/L (ref 0–44)
AST: 12 U/L — ABNORMAL LOW (ref 15–41)
Albumin: 2.4 g/dL — ABNORMAL LOW (ref 3.5–5.0)
Alkaline Phosphatase: 84 U/L (ref 38–126)
Anion gap: 8 (ref 5–15)
BUN: 43 mg/dL — ABNORMAL HIGH (ref 6–20)
CO2: 16 mmol/L — ABNORMAL LOW (ref 22–32)
Calcium: 8.2 mg/dL — ABNORMAL LOW (ref 8.9–10.3)
Chloride: 108 mmol/L (ref 98–111)
Creatinine, Ser: 2.18 mg/dL — ABNORMAL HIGH (ref 0.61–1.24)
GFR, Estimated: 35 mL/min — ABNORMAL LOW (ref >60)
Glucose, Bld: 124 mg/dL — ABNORMAL HIGH (ref 70–99)
Potassium: 4.9 mmol/L (ref 3.5–5.1)
Sodium: 132 mmol/L — ABNORMAL LOW (ref 135–145)
Total Bilirubin: 0.5 mg/dL (ref 0.3–1.2)
Total Protein: 8.1 g/dL (ref 6.5–8.1)

## 2021-02-26 LAB — BASIC METABOLIC PANEL
Anion gap: 10 (ref 5–15)
BUN: 42 mg/dL — ABNORMAL HIGH (ref 6–20)
CO2: 17 mmol/L — ABNORMAL LOW (ref 22–32)
Calcium: 8.1 mg/dL — ABNORMAL LOW (ref 8.9–10.3)
Chloride: 106 mmol/L (ref 98–111)
Creatinine, Ser: 1.89 mg/dL — ABNORMAL HIGH (ref 0.61–1.24)
GFR, Estimated: 42 mL/min — ABNORMAL LOW (ref >60)
Glucose, Bld: 110 mg/dL — ABNORMAL HIGH (ref 70–99)
Potassium: 5.2 mmol/L — ABNORMAL HIGH (ref 3.5–5.1)
Sodium: 133 mmol/L — ABNORMAL LOW (ref 135–145)

## 2021-02-26 LAB — CBC WITH DIFFERENTIAL/PLATELET
Abs Immature Granulocytes: 0.08 10*3/uL — ABNORMAL HIGH (ref 0.00–0.07)
Basophils Absolute: 0 10*3/uL (ref 0.0–0.1)
Basophils Relative: 0 %
Eosinophils Absolute: 0.1 10*3/uL (ref 0.0–0.5)
Eosinophils Relative: 1 %
HCT: 14.5 % — ABNORMAL LOW (ref 39.0–52.0)
Hemoglobin: 4.2 g/dL — CL (ref 13.0–17.0)
Immature Granulocytes: 1 %
Lymphocytes Relative: 25 %
Lymphs Abs: 2.8 10*3/uL (ref 0.7–4.0)
MCH: 26.6 pg (ref 26.0–34.0)
MCHC: 29 g/dL — ABNORMAL LOW (ref 30.0–36.0)
MCV: 91.8 fL (ref 80.0–100.0)
Monocytes Absolute: 0.7 10*3/uL (ref 0.1–1.0)
Monocytes Relative: 6 %
Neutro Abs: 7.5 10*3/uL (ref 1.7–7.7)
Neutrophils Relative %: 67 %
Platelets: 413 10*3/uL — ABNORMAL HIGH (ref 150–400)
RBC: 1.58 MIL/uL — ABNORMAL LOW (ref 4.22–5.81)
RDW: 20.3 % — ABNORMAL HIGH (ref 11.5–15.5)
WBC: 11.2 10*3/uL — ABNORMAL HIGH (ref 4.0–10.5)
nRBC: 0.2 % (ref 0.0–0.2)

## 2021-02-26 LAB — ECHOCARDIOGRAM COMPLETE
Area-P 1/2: 5.16 cm2
Calc EF: 59.9 %
Height: 73 in
S' Lateral: 3 cm
Single Plane A2C EF: 61.2 %
Single Plane A4C EF: 62.5 %
Weight: 3680 oz

## 2021-02-26 LAB — MRSA NEXT GEN BY PCR, NASAL
MRSA by PCR Next Gen: NOT DETECTED
MRSA by PCR Next Gen: NOT DETECTED

## 2021-02-26 LAB — HEMOGLOBIN AND HEMATOCRIT, BLOOD
HCT: 21.1 % — ABNORMAL LOW (ref 39.0–52.0)
HCT: 19.5 % — ABNORMAL LOW (ref 39.0–52.0)
HCT: 24.1 % — ABNORMAL LOW (ref 39.0–52.0)
Hemoglobin: 6.2 g/dL — CL (ref 13.0–17.0)
Hemoglobin: 5.8 g/dL — CL (ref 13.0–17.0)
Hemoglobin: 7.5 g/dL — ABNORMAL LOW (ref 13.0–17.0)

## 2021-02-26 LAB — PREPARE RBC (CROSSMATCH)

## 2021-02-26 LAB — BRAIN NATRIURETIC PEPTIDE: B Natriuretic Peptide: 312.1 pg/mL — ABNORMAL HIGH (ref 0.0–100.0)

## 2021-02-26 LAB — PROCALCITONIN: Procalcitonin: 5.61 ng/mL

## 2021-02-26 LAB — LACTATE DEHYDROGENASE: LDH: 92 U/L — ABNORMAL LOW (ref 98–192)

## 2021-02-26 LAB — RESP PANEL BY RT-PCR (FLU A&B, COVID) ARPGX2
Influenza A by PCR: NEGATIVE
Influenza B by PCR: NEGATIVE
SARS Coronavirus 2 by RT PCR: NEGATIVE

## 2021-02-26 LAB — STREP PNEUMONIAE URINARY ANTIGEN: Strep Pneumo Urinary Antigen: NEGATIVE

## 2021-02-26 LAB — MAGNESIUM: Magnesium: 2.5 mg/dL — ABNORMAL HIGH (ref 1.7–2.4)

## 2021-02-26 LAB — POC OCCULT BLOOD, ED: Fecal Occult Bld: POSITIVE — AB

## 2021-02-26 LAB — D-DIMER, QUANTITATIVE: D-Dimer, Quant: 3.71 ug/mL-FEU — ABNORMAL HIGH (ref 0.00–0.50)

## 2021-02-26 MED ORDER — CHLORHEXIDINE GLUCONATE CLOTH 2 % EX PADS
6.0000 | MEDICATED_PAD | Freq: Every day | CUTANEOUS | Status: DC
  Administered 2021-02-26 – 2021-03-03 (×4): 6 via TOPICAL

## 2021-02-26 MED ORDER — SODIUM CHLORIDE 0.9 % IV SOLN: INTRAVENOUS | Status: DC

## 2021-02-26 MED ORDER — ACETAMINOPHEN 325 MG PO TABS
650.0000 mg | ORAL_TABLET | ORAL | Status: DC | PRN
  Administered 2021-02-26 – 2021-03-01 (×7): 650 mg via ORAL
  Filled 2021-02-26 (×7): qty 2

## 2021-02-26 MED ORDER — SODIUM CHLORIDE 0.9 % IV SOLN
1.0000 g | INTRAVENOUS | Status: DC
  Administered 2021-02-26: 1 g via INTRAVENOUS
  Filled 2021-02-26: qty 10

## 2021-02-26 MED ORDER — PERFLUTREN LIPID MICROSPHERE
1.0000 mL | INTRAVENOUS | Status: AC | PRN
  Administered 2021-02-26: 2 mL via INTRAVENOUS
  Filled 2021-02-26: qty 10

## 2021-02-26 MED ORDER — ABACAVIR-DOLUTEGRAVIR-LAMIVUD 600-50-300 MG PO TABS
1.0000 | ORAL_TABLET | Freq: Every day | ORAL | Status: DC
  Administered 2021-02-26 – 2021-03-06 (×9): 1 via ORAL
  Filled 2021-02-26 (×9): qty 1

## 2021-02-26 MED ORDER — VANCOMYCIN HCL 1500 MG/300ML IV SOLN: 1500.0000 mg | INTRAVENOUS | Status: DC

## 2021-02-26 MED ORDER — FENTANYL CITRATE PF 50 MCG/ML IJ SOSY
50.0000 ug | PREFILLED_SYRINGE | Freq: Once | INTRAMUSCULAR | Status: AC
  Administered 2021-02-26: 50 ug via INTRAVENOUS
  Filled 2021-02-26: qty 1

## 2021-02-26 MED ORDER — ONDANSETRON HCL 4 MG/2ML IJ SOLN: 4.0000 mg | Freq: Four times a day (QID) | INTRAMUSCULAR | Status: DC | PRN

## 2021-02-26 MED ORDER — GUAIFENESIN 100 MG/5ML PO LIQD
5.0000 mL | Freq: Three times a day (TID) | ORAL | Status: DC
  Administered 2021-02-26 – 2021-03-06 (×21): 5 mL via ORAL
  Filled 2021-02-26 (×20): qty 10

## 2021-02-26 MED ORDER — DOCUSATE SODIUM 100 MG PO CAPS: 100.0000 mg | ORAL_CAPSULE | Freq: Two times a day (BID) | ORAL | Status: DC | PRN

## 2021-02-26 MED ORDER — VANCOMYCIN HCL 2000 MG/400ML IV SOLN
2000.0000 mg | Freq: Once | INTRAVENOUS | Status: AC
  Administered 2021-02-26: 2000 mg via INTRAVENOUS
  Filled 2021-02-26: qty 400

## 2021-02-26 MED ORDER — PANTOPRAZOLE SODIUM 40 MG IV SOLR: 40.0000 mg | Freq: Every day | INTRAVENOUS | Status: DC

## 2021-02-26 MED ORDER — SODIUM CHLORIDE 0.9 % IV SOLN: 10.0000 mL/h | Freq: Once | INTRAVENOUS | Status: DC

## 2021-02-26 MED ORDER — PANTOPRAZOLE SODIUM 40 MG IV SOLR
40.0000 mg | Freq: Two times a day (BID) | INTRAVENOUS | Status: DC
  Administered 2021-02-26 – 2021-03-03 (×11): 40 mg via INTRAVENOUS
  Filled 2021-02-26 (×11): qty 40

## 2021-02-26 MED ORDER — SODIUM CHLORIDE 0.9 % IV SOLN
2.0000 g | INTRAVENOUS | Status: AC
  Administered 2021-02-27 – 2021-03-04 (×6): 2 g via INTRAVENOUS
  Filled 2021-02-26 (×6): qty 20

## 2021-02-26 MED ORDER — SODIUM CHLORIDE (PF) 0.9 % IJ SOLN
INTRAMUSCULAR | Status: AC
  Filled 2021-02-26: qty 50

## 2021-02-26 MED ORDER — SODIUM CHLORIDE 0.9% IV SOLUTION: Freq: Once | INTRAVENOUS | Status: AC

## 2021-02-26 MED ORDER — IOHEXOL 350 MG/ML SOLN
64.0000 mL | Freq: Once | INTRAVENOUS | Status: AC | PRN
  Administered 2021-02-26: 64 mL via INTRAVENOUS

## 2021-02-26 MED ORDER — POLYETHYLENE GLYCOL 3350 17 G PO PACK: 17.0000 g | PACK | Freq: Every day | ORAL | Status: DC | PRN

## 2021-02-26 MED ORDER — SODIUM CHLORIDE 0.9 % IV SOLN
500.0000 mg | INTRAVENOUS | Status: DC
Start: 2021-02-26 — End: 2021-02-28
  Administered 2021-02-26 – 2021-02-28 (×3): 500 mg via INTRAVENOUS
  Filled 2021-02-26 (×3): qty 500

## 2021-02-26 NOTE — Progress Notes (Signed)
BLE venous duplex has been completed.   Results can be found under chart review under CV PROC. 02/26/2021 4:37 PM Adelaide Pfefferkorn RVT, RDMS

## 2021-02-26 NOTE — Progress Notes (Signed)
Pharmacy Antibiotic Note  Leon Nichols is a 55 y.o. male admitted on 02/25/2021 with pneumonia.  Pharmacy has been consulted for Vancomycin dosing. AKI noted  Plan: Vancomycin 2gm IV x1 then 1500mg  IV q24h to target AUC 400-550 Check Vancomycin levels at steady state Check MRSA PCR Monitor renal function and cx data   Height: 6\' 1"  (185.4 cm) Weight: 104.3 kg (230 lb) IBW/kg (Calculated) : 79.9  Temp (24hrs), Avg:98.2 F (36.8 C), Min:97.8 F (36.6 C), Max:98.9 F (37.2 C)  Recent Labs  Lab 02/25/21 2349  WBC 11.2*  CREATININE 2.18*    Estimated Creatinine Clearance: 49.7 mL/min (A) (by C-G formula based on SCr of 2.18 mg/dL (H)).    No Known Allergies  Antimicrobials this admission: 11/24 Rocephin >>  11/24 Zithromax >>  11/24 Vancomycin   Dose adjustments this admission:  Microbiology results: BCx:  UCx:   MRSA PCR:   Thank you for allowing pharmacy to be a part of this patient's care.  12/24 PharmD 02/26/2021 4:57 AM

## 2021-02-26 NOTE — Progress Notes (Signed)
Vitals:   02/26/21 0823 02/26/21 0924  BP: (!) 129/94 126/83  Pulse: 96 95  Resp: 20 (!) 29  Temp: 98.5 F (36.9 C) 98 F (36.7 C)  SpO2: 100% 98%     Admitted with PE and GI bleed. Has been transfused 2 units PRBC. Denies chest pain, lower extremity edema.  On exam -no accessory muscle use, able to speak in full sentences, saturation 99% on room air, chest clear, S1-S2 regular, 1+ bipedal edema.  Colostomy, soft nontender abdomen  Recommend -Serial H/H every 6 hours -Obtain echo and venous duplex both lower extremities. -GI consult, will need to define whether this is upper or lower GI bleeding he has no prior history of ulcers, remote colonoscopy, -IR consult for IVC filter placement.  I think it will take at least 48 hours to define whether bleeding is ongoing and source of bleed and would be best to go ahead and place filter in the meantime.  Also unclear how long he would be off anticoagulation  Tiarna Koppen V. Vassie Loll MD

## 2021-02-26 NOTE — Progress Notes (Addendum)
eLink Physician-Brief Progress Note Patient Name: Leon Nichols DOB: 14-Jan-1966 MRN: [id number]   Date of Service  02/26/2021  HPI/Events of Note  Was asked to follow serial H/H. Labs have been ordered q6, results pending.   eICU Interventions  RN to call us when result obtained     Intervention Category Major Interventions: Hemorrhage - evaluation and management  Leon Nichols Leon Nichols 02/26/2021, 9:51 PM  Addendum at 10:55 pm Hemoglobin is 7.5, from 5.8 - this was after 2 units (has received 4 units from admission) Doing well. ON room air. HR in 85-95 range. BP in 130s. Asleep.  Discussed with RN and will do the CBC and BMP at 4 am next - RN will let lab know If hemodynamics change, will check labs sooner  RN has noted no signs of bleeding from anywhere and patient really with no symptoms, and noted no plan for anticoagulation at this time, possible IR IVC filter this AM so have asked that RN notify us of 4 am results  Addendum at 630 am Notified of AM hemoglobin  Suspect this is an error, hemoglobin is 11.5 and creatinine down as well Will order repeat labs CBC and BMP to be done

## 2021-02-26 NOTE — H&P (Signed)
NAME:  Leon Nichols MRN:  [id number] DOB:  1965-04-24 LOS: 0 ADMISSION DATE:  02/25/2021 DATE OF SERVICE:  02/26/2021  CHIEF COMPLAINT:  dyspnea   HISTORY & PHYSICAL  History of Present Illness  This 55 y.o. African American male refomed smoker presented to the [facility] Emergency Department with complaints of rapidly progressive dyspnea, right lower chest pleuritic pain and bilateral lower extremity edema.  ER evaluation revealed that the patient has bilateral lower extremity DVTs and acute pulmnary embolism.  CTA chest also showed right lower lobe consolidation and right pleural effusion in addition to the pulmonary emboli.   Laboratory evaluation also revealed that the patient was anemic (Hgb 4.2) with positive fecal occult blood on exam.  REVIEW OF SYSTEMS Constitutional: No weight loss. No night sweats. No fever. No chills. Fatigue. HEENT: No headaches, dysphagia, sore throat, otalgia, nasal congestion, PND. CV:  Orthostasis. Lower extremity swelling. No chest pain, orthopnea, PND, palpitations. GI:  No abdominal pain, nausea, vomiting, diarrhea, change in bowel pattern, anorexia Resp: Exertional dyspnea. R sided pleuritic pain. No rest dyspnea, mucus, hemoptysis, wheezing. GU: hematuria has recently resolved. No dysuria, no urgency or frequency.  No flank pain. MS:  Bilateral calf pain. No joint pain or swelling. No myalgias,  No decreased range of motion.  Psych:  No change in mood or affect. No memory loss. Skin: no rash or lesions.   Past Medical/Surgical/Social/Family History   Past Medical History:  Diagnosis Date   Acute back pain 10/28/2016   Colitis    Diverticulosis    Hemorrhoids    History of shingles    HIV (human immunodeficiency virus infection) ([place]) 03/24/2012   Hypertension    Perianal abscess 02/06/2015   Perirectal abscess    Septic arthritis ([place]) 02/05/2015    Past Surgical History:  Procedure Laterality Date   arthroscopic wash  out of right knee     COLONOSCOPY  08/31/2011   Procedure: COLONOSCOPY;  Surgeon: [name], MD;  Location: [place];  Service: Endoscopy;  Laterality: N/A;   DRESSING CHANGE UNDER ANESTHESIA  03/27/2012   Procedure: DRESSING CHANGE UNDER ANESTHESIA;  Surgeon: [name], MD;  Location: [place];  Service: General;  Laterality: N/A;   INCISION AND DRAINAGE PERIRECTAL ABSCESS  03/23/2012   Procedure: IRRIGATION AND DEBRIDEMENT PERIRECTAL ABSCESS;  Surgeon: [name], MD;  Location: [place];  Service: General;  Laterality: N/A;  I & D soft tissue scrotal and perineal abscess   KNEE ARTHROSCOPY Right 02/05/2015   Procedure: ARTHROSCOPIC [facility] OUT RIGHT KNEE;  Surgeon: Renette Butters, MD;  Location: [place];  Service: Orthopedics;  Laterality: Right;   LAPAROSCOPIC DIVERTED COLOSTOMY  03/27/2012   Procedure: LAPAROSCOPIC DIVERTED COLOSTOMY;  Surgeon: [name], MD;  Location[facility];  Service: General;  Laterality: N/A;    Social History   Tobacco Use   Smoking status: Former    Packs/day: 0.25    Years: 15.00    Pack years: 3.75    Types: Cigarettes   Smokeless tobacco: Never   Tobacco comments:    less than 5 cigarettes a day  Substance Use Topics   Alcohol use: Yes    Alcohol/week: 50.0 standard drinks    Types: 50 Cans of beer per week    Comment: 6-7 beers a day    Family History  Problem Relation Age of Onset   Hypertension Mother    Diabetes Mother      Procedures:  Significant Diagnostic Tests:  11/24: CTA chest shows bilateral pulmonary emboli, RLL consolidation and tiny pleural effusion.   Micro Data:   Results for orders placed or performed during the hospital encounter of 02/25/21  Resp Panel by RT-PCR (Flu A&B, Covid) Nasopharyngeal Swab     Status: None   Collection Time: 02/26/21 12:32 AM   Specimen: Nasopharyngeal Swab; Nasopharyngeal(NP) swabs in vial transport medium  Result Value Ref Range Status   SARS Coronavirus 2 by  RT PCR NEGATIVE NEGATIVE Final    Comment: (NOTE) SARS-CoV-2 target nucleic acids are NOT DETECTED.  The SARS-CoV-2 RNA is generally detectable in upper respiratory specimens during the acute phase of infection. The lowest concentration of SARS-CoV-2 viral copies this assay can detect is 138 copies/mL. A negative result does not preclude SARS-Cov-2 infection and should not be used as the sole basis for treatment or other patient management decisions. A negative result may occur with  improper specimen collection/handling, submission of specimen other than nasopharyngeal swab, presence of viral mutation(s) within the areas targeted by this assay, and inadequate number of viral copies(<138 copies/mL). A negative result must be combined with clinical observations, patient history, and epidemiological information. The expected result is Negative.  Fact Sheet for Patients:  EntrepreneurPulse.com.au  Fact Sheet for Healthcare Providers:  IncredibleEmployment.be  This test is no t yet approved or cleared by the [place] FDA and  has been authorized for detection and/or diagnosis of SARS-CoV-2 by FDA under an Emergency Use Authorization (EUA). This EUA will remain  in effect (meaning this test can be used) for the duration of the COVID-19 declaration under Section 564(b)(1) of the Act, 21 U.S.C.section 360bbb-3(b)(1), unless the authorization is terminated  or revoked sooner.       Influenza A by PCR NEGATIVE NEGATIVE Final   Influenza B by PCR NEGATIVE NEGATIVE Final    Comment: (NOTE) The Xpert Xpress SARS-CoV-2/FLU/RSV plus assay is intended as an aid in the diagnosis of influenza from Nasopharyngeal swab specimens and should not be used as a sole basis for treatment. Nasal washings and aspirates are unacceptable for Xpert Xpress SARS-CoV-2/FLU/RSV testing.  Fact Sheet for Patients: EntrepreneurPulse.com.au  Fact Sheet  for Healthcare Providers: IncredibleEmployment.be  This test is not yet approved or cleared by the [place] FDA and has been authorized for detection and/or diagnosis of SARS-CoV-2 by FDA under an Emergency Use Authorization (EUA). This EUA will remain in effect (meaning this test can be used) for the duration of the COVID-19 declaration under Section 564(b)(1) of the Act, 21 U.S.C. section 360bbb-3(b)(1), unless the authorization is terminated or revoked.  Performed at [facility], [place] [address], [place], [place] [address]       Antimicrobials:      Interim history/subjective:     Objective   BP 126/73   Pulse 99   Temp 98.1 F (36.7 C) (Oral)   Resp (!) 30   Ht 6\' 1"  (1.854 m)   Wt 104.3 kg   SpO2 100%   BMI 30.34 kg/m     Filed Weights   02/25/21 2322  Weight: 104.3 kg   No intake or output data in the 24 hours ending 02/26/21 0329      Examination: GENERAL: alert, oriented to time, person and place, pleasant, well-developed. No acute distress. HEAD: normocephalic, atraumatic EYE: PERRLA, EOM intact, no scleral icterus, no pallor. NOSE: nares are patent. No polyps. No exudate. No sinus tenderness. THROAT/ORAL CAVITY: Normal dentition. No oral thrush. No exudate.  Mucous membranes are moist. No tonsillar enlargement.  NECK: supple, no thyromegaly, no JVD, no lymphadenopathy. Trachea midline. CHEST/LUNG: symmetric in development and expansion. Good air entry.  Bilateral crackles (R>>L).  No wheezes. HEART: Regular S1 and S2 without murmur, rub or gallop. Tachycardia. ABDOMEN: soft, nontender, nondistended. Normoactive bowel sounds. No rebound. No guarding. No hepatosplenomegaly. EXTREMITIES: Edema: 2+ and pitting.  No cyanosis. Digital clubbing. 2+ DP pulses LYMPHATIC: no cervical/axillary/inguinal lymph nodes appreciated MUSCULOSKELETAL: No point tenderness. No bulk atrophy. Joints: normal inspection. No Homan's  sign.  SKIN:  No rash or lesion. NEUROLOGIC: Doll's eyes intact. Corneal reflex intact. Spontaneous respirations intact. Cranial nerves II-XII are grossly symmetric and physiologic. Babinski absent. No sensory deficit. Motor: 5/5 @ RUE, 5/5 @ LUE, 5/5 @ RLL,  5/5 @ LLL.  DTR: 2+ @ R biceps, 2+ @ L biceps, 2+ @ R patellar,  2+ @ L patellar. No cerebellar signs. Gait was not assessed.   Resolved Hospital Problem list      Assessment & Plan:   ASSESSMENT/PLAN:  ASSESSMENT (included in the Hospital Problem List)  Principal Problem:   Acute pulmonary embolism ([place]) Active Problems:   Anemia   Acute kidney injury ([place])   Acute DVT (deep venous thrombosis) (H[facility])   Occult blood in stools   HIV disease ([place])   Pyuria, sterile   Acute pulmonary embolism without acute cor pulmonale (H[facility])   By systems: PULMONARY Acute pulmonary embolism Right lower lobe consolidation: pneumonia vs infarct Right pleural effusion, tiny Withhold heparin at this time in light of profound anemia and suspicion of GI bleed Empiric Rocephin/azithromycin/vancomycin Check procalcitonin   CARDIOVASCULAR Tachycardia with tachypnea Transfuse to Hgb > 8 IV fluids: NS @ 100 mL/hr   RENAL Acute kidney injury Hyponatremia, likely SIADH due to RLL consolidation Unclear whether this is contrast-induced or NSAID-induced at this time Check ABG to determine acid-base status Avoid nephrotoxic drugs Renal dosing of medications Hold Zestril No NSAIDs   GASTROINTESTINAL Fecal occult blood positive GI PROPHYLAXIS: Protonix   HEMATOLOGIC Severe normocytic anemia Acute venous thromboembolism Transfuse as needed If hemoglobin stable after transfusion, I would recommend starting anticoagulation at that time and monitor for bleeding.  He may prove to need IVC filter placement.  In light of suspected blood loss and stable hemodynamics at this time, thrombolytics are contraindicated. Monitor hemoglobin Check LDH,  haptoglobin DVT PROPHYLAXIS: SCDs for now   INFECTIOUS HIV infection Continue Triumeq   ENDOCRINE: No acute issues   NEUROLOGIC: No acute issues   PLAN/RECOMMENDATIONS  Admit to ICU under my service (Attending: Renee Pain, MD) with the diagnoses highlighted above in the active Hospital Problem List (ASSESSMENT). See above NUTRITION: renal    My assessment, plan of care, findings, medications, side effects, etc. were discussed with: nurse and Dr. Shanon Rosser (Emergency Medicine).   Best practice:  Diet: renal Pain/Anxiety/Delirium protocol (if indicated): N/A VAP protocol (if indicated): N/A DVT prophylaxis: SCDs GI prophylaxis: Protonix Glucose control: N/A Mobility/Activity: bedrest for now   Code Status: Full Code Family Communication:   no family at bedside Disposition: admit to ICU   Labs   CBC: Recent Labs  Lab 02/25/21 2349  WBC 11.2*  NEUTROABS 7.5  HGB 4.2*  HCT 14.5*  MCV 91.8  PLT 413*    Basic Metabolic Panel: Recent Labs  Lab 02/25/21 2349  NA 132*  K 4.9  CL 108  CO2 16*  GLUCOSE 124*  BUN 43*  CREATININE 2.18*  CALCIUM 8.2*   GFR: Estimated Creatinine  Clearance: 49.7 mL/min (A) (by C-G formula based on SCr of 2.18 mg/dL (H)). Recent Labs  Lab 02/25/21 2349  WBC 11.2*    Liver Function Tests: Recent Labs  Lab 02/25/21 2349  AST 12*  ALT 7  ALKPHOS 84  BILITOT 0.5  PROT 8.1  ALBUMIN 2.4*   No results for input(s): LIPASE, AMYLASE in the last 168 hours. No results for input(s): AMMONIA in the last 168 hours.  ABG    Component Value Date/Time   HCO3 20.6 03/23/2012 1316   TCO2 22 03/23/2012 1316   ACIDBASEDEF 5.0 (H) 03/23/2012 1316   O2SAT 100.0 03/23/2012 1316     Coagulation Profile: No results for input(s): INR, PROTIME in the last 168 hours.  Cardiac Enzymes: No results for input(s): CKTOTAL, CKMB, CKMBINDEX, TROPONINI in the last 168 hours.  HbA1C: Hgb A1c MFr Bld  Date/Time Value Ref Range Status   03/23/2012 10:39 AM 6.2 (H) <5.7 % Final    Comment:    (NOTE)                                                                       According to the ADA Clinical Practice Recommendations for 2011, when HbA1c is used as a screening test:  >=6.5%   Diagnostic of Diabetes Mellitus           (if abnormal result is confirmed) 5.7-6.4%   Increased risk of developing Diabetes Mellitus References:Diagnosis and Classification of Diabetes Mellitus,Diabetes [id number] 1):S62-S69 and Standards of Medical Care in         Diabetes - 2011,Diabetes [id number] (Suppl 1):S11-S61.    CBG: No results for input(s): GLUCAP in the last 168 hours.   Past Medical History   Past Medical History:  Diagnosis Date   Acute back pain 10/28/2016   Colitis    Diverticulosis    Hemorrhoids    History of shingles    HIV (human immunodeficiency virus infection) ([name]) 03/24/2012   Hypertension    Perianal abscess 02/06/2015   Perirectal abscess    Septic arthritis ([place]) 02/05/2015      Surgical History    Past Surgical History:  Procedure Laterality Date   arthroscopic wash out of right knee     COLONOSCOPY  08/31/2011   Procedure: COLONOSCOPY;  Surgeon: [name], MD;  Location: [place];  Service: Endoscopy;  Laterality: N/A;   DRESSING CHANGE UNDER ANESTHESIA  03/27/2012   Procedure: DRESSING CHANGE UNDER ANESTHESIA;  Surgeon: [name], MD;  Location: [place];  Service: General;  Laterality: N/A;   INCISION AND DRAINAGE PERIRECTAL ABSCESS  03/23/2012   Procedure: IRRIGATION AND DEBRIDEMENT PERIRECTAL ABSCESS;  Surgeon: [name], MD;  Location: [place];  Service: General;  Laterality: N/A;  I & D soft tissue scrotal and perineal abscess   KNEE ARTHROSCOPY Right 02/05/2015   Procedure: ARTHROSCOPIC [facility] OUT RIGHT KNEE;  Surgeon: Renette Butters, MD;  Location: [place];  Service: Orthopedics;  Laterality: Right;   LAPAROSCOPIC DIVERTED COLOSTOMY  03/27/2012   Procedure:  LAPAROSCOPIC DIVERTED COLOSTOMY;  Surgeon: [name], MD;  Location: [place] [facility];  Service: General;  Laterality: N/A;      Social History   Social History   Socioeconomic History  Marital status: Single    Spouse name: Not on file   Number of children: Not on file   Years of education: Not on file   Highest education level: Not on file  Occupational History   Not on file  Tobacco Use   Smoking status: Former    Packs/day: 0.25    Years: 15.00    Pack years: 3.75    Types: Cigarettes   Smokeless tobacco: Never   Tobacco comments:    less than 5 cigarettes a day  Vaping Use   Vaping Use: Never used  Substance and Sexual Activity   Alcohol use: Yes    Alcohol/week: 50.0 standard drinks    Types: 50 Cans of beer per week    Comment: 6-7 beers a day   Drug use: Not Currently    Types: Marijuana, Cocaine   Sexual activity: Not Currently    Partners: Female    Birth control/protection: Condom    Comment: declined condoms  Other Topics Concern   Not on file  Social History Narrative   Not on file   Social Determinants of Health   Financial Resource Strain: Not on file  Food Insecurity: Not on file  Transportation Needs: Not on file  Physical Activity: Not on file  Stress: Not on file  Social Connections: Not on file      Family History    Family History  Problem Relation Age of Onset   Hypertension Mother    Diabetes Mother    family history includes Diabetes in his mother; Hypertension in his mother.    Allergies No Known Allergies    Current Medications  Current Facility-Administered Medications:    0.9 %  sodium chloride infusion, 10 mL/hr, Intravenous, Once, Molpus, John, MD   sodium chloride (PF) 0.9 % injection, , , ,   Current Outpatient Medications:    abacavir-dolutegravir-lamiVUDine (TRIUMEQ) 600-50-300 MG tablet, Take 1 tablet by mouth daily., Disp: 30 tablet, Rfl: 5   amLODipine (NORVASC) 10 MG tablet, TAKE 1 TABLET(10 MG) BY MOUTH  DAILY (Patient taking differently: Take 10 mg by mouth daily.), Disp: 30 tablet, Rfl: 0   ibuprofen (ADVIL) 200 MG tablet, Take 200 mg by mouth every 8 (eight) hours as needed for mild pain., Disp: , Rfl:    lisinopril (ZESTRIL) 20 MG tablet, TAKE 1 TABLET(20 MG) BY MOUTH DAILY (Patient taking differently: Take 20 mg by mouth daily.), Disp: 30 tablet, Rfl: 0   Home Medications  Prior to Admission medications   Medication Sig Start Date End Date Taking? Authorizing Provider  abacavir-dolutegravir-lamiVUDine (TRIUMEQ) 600-50-300 MG tablet Take 1 tablet by mouth daily. 01/28/21  Yes Odette Fraction, MD  amLODipine (NORVASC) 10 MG tablet TAKE 1 TABLET(10 MG) BY MOUTH DAILY Patient taking differently: Take 10 mg by mouth daily. 12/18/20  Yes [name], MD  ibuprofen (ADVIL) 200 MG tablet Take 200 mg by mouth every 8 (eight) hours as needed for mild pain.   Yes [provider]  lisinopril (ZESTRIL) 20 MG tablet TAKE 1 TABLET(20 MG) BY MOUTH DAILY Patient taking differently: Take 20 mg by mouth daily. 12/18/20  Yes [name], MD      Critical care time: 45 minutes.  The treatment and management of the patient's condition was required based on the threat of imminent deterioration. This time reflects time spent by the physician evaluating, providing care and managing the critically ill patient's care. The time was spent at the immediate bedside (or on the same floor/unit and  dedicated to this patient's care). Time involved in separately billable procedures is NOT included int he critical care time indicated above. Family meeting and update time may be included above if and only if the patient is unable/incompetent to participate in clinical interview and/or decision making, and the discussion was necessary to determining treatment decisions.   Renee Pain, MD Board Certified by the ABIM, [place]

## 2021-02-26 NOTE — Plan of Care (Signed)

## 2021-02-26 NOTE — ED Notes (Signed)
Patient was given his urinal per his request.

## 2021-02-26 NOTE — ED Notes (Signed)
Patient was given his breakfast tray. He was also provided with a new urinal.

## 2021-02-26 NOTE — ED Provider Notes (Addendum)
[facility] DEPT Provider Note: Georgena Spurling, MD, FACEP  CSN: [id number]: [id number] ARRIVAL: 02/25/21 at 2307 ROOM: [place]  02/26/21 12:12 AM Leon Nichols is a 55 y.o. male with HIV disease on antiretroviral therapy.  He is here with about 2 weeks of shortness of breath and cough.  He has having a sharp right lower chest pain when he coughs which he rates as a 4 out of 10.  He has not had a fever with this.  He has also noticed swelling in his legs which is not severe but was not present before about 2 weeks ago.  There is a burning pain in his feet with palpation or ambulation.  Shortness of breath is moderate and worse with exertion.  It has limited his ability to work.   Past Medical History:  Diagnosis Date   Acute back pain 10/28/2016   Colitis    Diverticulosis    Hemorrhoids    History of shingles    HIV (human immunodeficiency virus infection) ([place]) 03/24/2012   Hypertension    Perianal abscess 02/06/2015   Perirectal abscess    Septic arthritis ([place]) 02/05/2015    Past Surgical History:  Procedure Laterality Date   arthroscopic wash out of right knee     COLONOSCOPY  08/31/2011   Procedure: COLONOSCOPY;  Surgeon: [name], MD;  Location: [facility];  Service: Endoscopy;  Laterality: N/A;   DRESSING CHANGE UNDER ANESTHESIA  03/27/2012   Procedure: DRESSING CHANGE UNDER ANESTHESIA;  Surgeon: [name], MD;  Location: [facility];  Service: General;  Laterality: N/A;   INCISION AND DRAINAGE PERIRECTAL ABSCESS  03/23/2012   Procedure: IRRIGATION AND DEBRIDEMENT PERIRECTAL ABSCESS;  Surgeon: [name], MD;  Location: [facility];  Service: General;  Laterality: N/A;  I & D soft tissue scrotal and perineal abscess   KNEE ARTHROSCOPY Right 02/05/2015   Procedure: ARTHROSCOPIC [facility] OUT RIGHT KNEE;  Surgeon: Renette Butters, MD;  Location: [facility];  Service: Orthopedics;  Laterality:  Right;   LAPAROSCOPIC DIVERTED COLOSTOMY  03/27/2012   Procedure: LAPAROSCOPIC DIVERTED COLOSTOMY;  Surgeon: [name], MD;  Location: [facility];  Service: General;  Laterality: N/A;    Family History  Problem Relation Age of Onset   Hypertension Mother    Diabetes Mother     Social History   Tobacco Use   Smoking status: Former    Packs/day: 0.25    Years: 15.00    Pack years: 3.75    Types: Cigarettes   Smokeless tobacco: Never   Tobacco comments:    less than 5 cigarettes a day  Vaping Use   Vaping Use: Never used  Substance Use Topics   Alcohol use: Yes    Alcohol/week: 50.0 standard drinks    Types: 50 Cans of beer per week    Comment: 6-7 beers a day   Drug use: Not Currently    Types: Marijuana, Cocaine    Prior to Admission medications   Medication Sig Start Date End Date Taking? Authorizing Provider  abacavir-dolutegravir-lamiVUDine (TRIUMEQ) 600-50-300 MG tablet Take 1 tablet by mouth daily. 01/28/21  Yes Rosiland Oz, MD  amLODipine (NORVASC) 10 MG tablet TAKE 1 TABLET(10 MG) BY MOUTH DAILY Patient taking differently: Take 10 mg by mouth daily. 12/18/20  Yes [name], MD  ibuprofen (ADVIL) 200 MG tablet Take 200 mg by mouth every 8 (eight) hours as  needed for mild pain.   Yes [provider]  lisinopril (ZESTRIL) 20 MG tablet TAKE 1 TABLET(20 MG) BY MOUTH DAILY Patient taking differently: Take 20 mg by mouth daily. 12/18/20  Yes [name], MD    Allergies Patient has no known allergies.   REVIEW OF SYSTEMS  Negative except as noted here or in the History of Present Illness.   PHYSICAL EXAMINATION  Initial Vital Signs Blood pressure 122/77, pulse 92, temperature 98.9 F (37.2 C), temperature source Oral, resp. rate 17, height 6\' 1"  (1.854 m), weight 104.3 kg, SpO2 100 %.  Examination General: Well-developed, well-nourished male in no acute distress; appearance consistent with age of record HENT: normocephalic;  atraumatic Eyes: pupils equal, round and reactive to light; extraocular muscles intact; pale conjunctiva with slight conjunctival icterus Neck: supple Heart: regular rate and rhythm Lungs: clear to auscultation bilaterally; shallow breaths Abdomen: soft; nondistended; nontender; bowel sounds present; left lower quadrant colostomy without significant contents Rectal: Normal sphincter tone; formed stool in vault, trace heme positive Extremities: No deformity; full range of motion; +1 edema of lower legs and feet with mild tenderness to palpation Neurologic: Awake, alert and oriented; motor function intact in all extremities and symmetric; no facial droop Skin: Warm and dry Psychiatric: Normal mood and affect   RESULTS  Summary of this visit's results, reviewed and interpreted by myself:   EKG Interpretation  Date/Time:  Thursday February 26 2021 00:00:27 EST Ventricular Rate:  95 PR Interval:  159 QRS Duration: 91 QT Interval:  366 QTC Calculation: 461 R Axis:   59 Text Interpretation: Sinus rhythm Borderline ST depression, lateral leads Rate is faster Confirmed by Lataja Newland, Jenny Reichmann [phone]) on 02/26/2021 12:05:02 AM       Laboratory Studies: Results for orders placed or performed during the hospital encounter of 02/25/21 (from the past 24 hour(s))  Comprehensive metabolic panel     Status: Abnormal   Collection Time: 02/25/21 11:49 PM  Result Value Ref Range   Sodium 132 (L) 135 - 145 mmol/L   Potassium 4.9 3.5 - 5.1 mmol/L   Chloride 108 98 - 111 mmol/L   CO2 16 (L) 22 - 32 mmol/L   Glucose, Bld 124 (H) 70 - 99 mg/dL   BUN 43 (H) 6 - 20 mg/dL   Creatinine, Ser 2.18 (H) 0.61 - 1.24 mg/dL   Calcium 8.2 (L) 8.9 - 10.3 mg/dL   Total Protein 8.1 6.5 - 8.1 g/dL   Albumin 2.4 (L) 3.5 - 5.0 g/dL   AST 12 (L) 15 - 41 U/L   ALT 7 0 - 44 U/L   Alkaline Phosphatase 84 38 - 126 U/L   Total Bilirubin 0.5 0.3 - 1.2 mg/dL   GFR, Estimated 35 (L) >60 mL/min   Anion gap 8 5 - 15  CBC with  Differential     Status: Abnormal   Collection Time: 02/25/21 11:49 PM  Result Value Ref Range   WBC 11.2 (H) 4.0 - 10.5 K/uL   RBC 1.58 (L) 4.22 - 5.81 MIL/uL   Hemoglobin 4.2 (LL) 13.0 - 17.0 g/dL   HCT 14.5 (L) 39.0 - 52.0 %   MCV 91.8 80.0 - 100.0 fL   MCH 26.6 26.0 - 34.0 pg   MCHC 29.0 (L) 30.0 - 36.0 g/dL   RDW 20.3 (H) 11.5 - 15.5 %   Platelets 413 (H) 150 - 400 K/uL   nRBC 0.2 0.0 - 0.2 %   Neutrophils Relative % 67 %   Neutro Abs  7.5 1.7 - 7.7 K/uL   Lymphocytes Relative 25 %   Lymphs Abs 2.8 0.7 - 4.0 K/uL   Monocytes Relative 6 %   Monocytes Absolute 0.7 0.1 - 1.0 K/uL   Eosinophils Relative 1 %   Eosinophils Absolute 0.1 0.0 - 0.5 K/uL   Basophils Relative 0 %   Basophils Absolute 0.0 0.0 - 0.1 K/uL   Immature Granulocytes 1 %   Abs Immature Granulocytes 0.08 (H) 0.00 - 0.07 K/uL  Brain natriuretic peptide     Status: Abnormal   Collection Time: 02/25/21 11:50 PM  Result Value Ref Range   B Natriuretic Peptide 312.1 (H) 0.0 - 100.0 pg/mL  Resp Panel by RT-PCR (Flu A&B, Covid) Nasopharyngeal Swab     Status: None   Collection Time: 02/26/21 12:32 AM   Specimen: Nasopharyngeal Swab; Nasopharyngeal(NP) swabs in vial transport medium  Result Value Ref Range   SARS Coronavirus 2 by RT PCR NEGATIVE NEGATIVE   Influenza A by PCR NEGATIVE NEGATIVE   Influenza B by PCR NEGATIVE NEGATIVE  D-dimer, quantitative     Status: Abnormal   Collection Time: 02/26/21 12:32 AM  Result Value Ref Range   D-Dimer, Quant 3.71 (H) 0.00 - 0.50 ug/mL-FEU  Prepare RBC (crossmatch)     Status: None   Collection Time: 02/26/21  1:31 AM  Result Value Ref Range   Order Confirmation      ORDER PROCESSED BY BLOOD BANK Performed at [facility], [place] [address], [place], [place] [address]   Type and screen     Status: None (Preliminary result)   Collection Time: 02/26/21  1:31 AM  Result Value Ref Range   ABO/RH(D) O POS    Antibody Screen NEG    Sample Expiration       03/01/2021,2359 Performed at [facility], [place] [address], [place], [place] [address]    Unit Number [id number]    Blood Component Type RED CELLS,LR    Unit division 00    Status of Unit ALLOCATED    Transfusion Status OK TO TRANSFUSE    Crossmatch Result Compatible    Unit Number [id number]    Blood Component Type RED CELLS,LR    Unit division 00    Status of Unit ALLOCATED    Transfusion Status OK TO TRANSFUSE    Crossmatch Result Compatible   Urinalysis, Routine w reflex microscopic Urine, Clean Catch     Status: Abnormal   Collection Time: 02/26/21  2:00 AM  Result Value Ref Range   Color, Urine YELLOW YELLOW   APPearance HAZY (A) CLEAR   Specific Gravity, Urine 1.018 1.005 - 1.030   pH 5.0 5.0 - 8.0   Glucose, UA NEGATIVE NEGATIVE mg/dL   Hgb urine dipstick MODERATE (A) NEGATIVE   Bilirubin Urine NEGATIVE NEGATIVE   Ketones, ur NEGATIVE NEGATIVE mg/dL   Protein, ur 30 (A) NEGATIVE mg/dL   Nitrite NEGATIVE NEGATIVE   Leukocytes,Ua NEGATIVE NEGATIVE   RBC / HPF 0-5 0 - 5 RBC/hpf   WBC, UA 11-20 0 - 5 WBC/hpf   Bacteria, UA NONE SEEN NONE SEEN   Mucus PRESENT   POC occult blood, ED Provider will collect     Status: Abnormal   Collection Time: 02/26/21  2:36 AM  Result Value Ref Range   Fecal Occult Bld POSITIVE (A) NEGATIVE   Imaging Studies: DG Chest 2 View  Result Date: 02/25/2021 CLINICAL DATA:  Shortness of breath EXAM: CHEST - 2 VIEW COMPARISON:  03/29/2012 FINDINGS: Ground-glass airspace disease at the right base suspicious for pneumonia. Borderline cardiomegaly. No significant effusion. No pneumothorax IMPRESSION: Findings suspicious for right lower lobe pneumonia Electronically Signed   By: Jasmine Pang M.D.   On: 02/25/2021 23:42   CT Angio Chest PE W and/or Wo Contrast  Addendum Date: 02/26/2021   ADDENDUM REPORT: 02/26/2021 02:31 ADDENDUM: Critical findings were reported to Dr. Read Drivers at 2:25 a.m. Electronically Signed   By: Thornell Sartorius M.D.   On: 02/26/2021 02:31   Result Date: 02/26/2021 CLINICAL DATA:  Positive D-dimer, PE suspected, low to intermediate probability. Shortness of breath, cough, lower extremity edema for 2 weeks. EXAM: CT ANGIOGRAPHY CHEST WITH CONTRAST TECHNIQUE: Multidetector CT imaging of the chest was performed using the standard protocol during bolus administration of intravenous contrast. Multiplanar CT image reconstructions and MIPs were obtained to evaluate the vascular anatomy. CONTRAST:  77mL OMNIPAQUE IOHEXOL 350 MG/ML SOLN COMPARISON:  02/25/2021. FINDINGS: Cardiovascular: The heart is normal in size and there is no pericardial effusion. No aortic aneurysm is seen. The pulmonary trunk is distended which may be associated with underlying pulmonary artery hypertension. There is a pulmonary artery filling defect in the right lower lobe lobar artery. Scattered segmental and subsegmental pulmonary artery filling defects are noted in the right upper lobe and left lower lobe. Examination is limited due to mixing artifact and respiratory motion. The right ventricle is mildly distended suggesting underlying right heart strain. Mediastinum/Nodes: Prominent lymph nodes are present at the right hilum. Shotty lymph nodes are seen in the mediastinum. No axillary or left hilar lymphadenopathy. The thyroid gland, trachea, and esophagus are within normal limits. Lungs/Pleura: Consolidation is noted in the right lower lobe in the region of pulmonary embolism, possible pneumonia versus pulmonary infarct. There is a vague ground-glass opacity in the left upper lobe measuring 1.5 cm. There is a small right pleural effusion. No pneumothorax is seen. Upper Abdomen: No acute abnormality. Musculoskeletal: Mild degenerative changes in the thoracic spine. No acute osseous abnormality is seen. Mild gynecomastia is noted bilaterally. Review of the MIP images confirms the above findings. IMPRESSION: 1. Bilateral pulmonary emboli, the  largest in the right lower lobe. The right ventricle is distended suggesting underlying right heart strain. 2. Right lower lobe consolidation in the region of pulmonary embolism in the right lower lobe and ground-glass opacity in the left upper lobe, which may represent infection versus pulmonary infarct. 3. Small right pleural effusion. Electronically Signed: By: Thornell Sartorius M.D. On: 02/26/2021 02:23    ED COURSE and MDM  Nursing notes, initial and subsequent vitals signs, including pulse oximetry, reviewed and interpreted by myself.  Vitals:   02/26/21 0100 02/26/21 0115 02/26/21 0130 02/26/21 0215  BP: 116/82 124/79 108/82 123/74  Pulse: 94 95 93 94  Resp: (!) 23 (!) 35 (!) 27 (!) 39  Temp:      TempSrc:      SpO2: 100% 100% 100% 100%  Weight:      Height:       Medications  0.9 %  sodium chloride infusion (has no administration in time range)  sodium chloride (PF) 0.9 % injection (has no administration in time range)  iohexol (OMNIPAQUE) 350 MG/ML injection 64 mL (64 mLs Intravenous Contrast Given 02/26/21 0150)  fentaNYL (SUBLIMAZE) injection 50 mcg (50 mcg Intravenous Given 02/26/21 0229)    12:39 AM Hemoglobin is 4.2.  Patient denies melena.  He states his urine has been a dark amber.  12:56 AM Two units  packed red blood cells ordered for transfusion.  Indication: Symptomatic anemia.  2:37 AM Discussed with Dr. Hal Hope who would prefer the patient be managed by the pulmonary critical care service.  2:51 AM Discussed with Dr. Carson Myrtle of pulmonary critical care.  He agrees that we should start blood transfusion but would like to hold on anticoagulation pending improvement in his hemoglobin in light of his current hemodynamic stability.  He request that we place him on oxygen by nasal cannula.  PROCEDURES  Procedures CRITICAL CARE Performed by: Karen Chafe Layana Konkel Total critical care time: 45 minutes Critical care time was exclusive of separately billable procedures and  treating other patients. Critical care was necessary to treat or prevent imminent or life-threatening deterioration. Critical care was time spent personally by me on the following activities: development of treatment plan with patient and/or surrogate as well as nursing, discussions with consultants, evaluation of patient's response to treatment, examination of patient, obtaining history from patient or surrogate, ordering and performing treatments and interventions, ordering and review of laboratory studies, ordering and review of radiographic studies, pulse oximetry and re-evaluation of patient's condition.   ED DIAGNOSES     ICD-10-CM   1. Symptomatic anemia  D64.9     2. AKI (acute kidney injury) ([place])  N17.9     3. Peripheral edema  R60.9     4. Pulmonary embolism on left (HCC)  I26.99     5. Pulmonary embolism on right (HCC)  I26.99     6. Other acute pulmonary embolism with acute cor pulmonale (HCC)  I26.09     7. Heme positive stool  R19.5          Glenora Morocho, Jenny Reichmann, MD 02/26/21 0404    Shanon Rosser, MD 02/26/21 [phone]

## 2021-02-26 NOTE — Consult Note (Addendum)
Referring Provider:  Dr. Elsworth Soho Primary Care Physician:  Pcp, No Primary Gastroenterologist: Althia Forts  Reason for Consultation: GI bleeding in setting of acute PE  HPI: Leon Nichols is a 55 y.o. male with past medical history of HIV currently on antiretroviral therapy presented to the hospital with 2 weeks of shortness of breath, right-sided chest pain and lower extremity swelling.    Upon initial evaluation, he was found to have severe anemia with hemoglobin of 4.2.  Normal LFTs.  CT chest showed bilateral pulmonary emboli along with a right lower lobe consolidation concerning for infection versus pulmonary infarct.  FOBT positive.  GI is consulted for severe anemia.  Colonoscopy in 2013 by Dr. Henrene Pastor showed normal terminal ileum, mild inflammation in the sigmoid colon probably from diverticular related colitis and hemorrhoids.  Biopsy showed active colitis with ulcerations.  Looks like patient was supposed to follow-up with Dr. Henrene Pastor as an outpatient but he did not follow through.   Patient seen and examined at bedside.  He denies any blood in the stool or black stool.  Denies any abdominal pain, nausea and vomiting.  Denies any change in bowel habits.  Started developing fatigue weakness and shortness of breath around 2 weeks ago.  He started taking Aleve last week because of chest pain and weakness.   Past Medical History:  Diagnosis Date   Acute back pain 10/28/2016   Colitis    Diverticulosis    Hemorrhoids    History of shingles    HIV (human immunodeficiency virus infection) ([place]) 03/24/2012   Hypertension    Perianal abscess 02/06/2015   Perirectal abscess    Septic arthritis ([place]) 02/05/2015    Past Surgical History:  Procedure Laterality Date   arthroscopic wash out of right knee     COLONOSCOPY  08/31/2011   Procedure: COLONOSCOPY;  Surgeon: [name], MD;  Location: [place];  Service: Endoscopy;  Laterality: N/A;   DRESSING CHANGE UNDER ANESTHESIA  03/27/2012    Procedure: DRESSING CHANGE UNDER ANESTHESIA;  Surgeon: [name], MD;  Location: [place];  Service: General;  Laterality: N/A;   INCISION AND DRAINAGE PERIRECTAL ABSCESS  03/23/2012   Procedure: IRRIGATION AND DEBRIDEMENT PERIRECTAL ABSCESS;  Surgeon: [name], MD;  Location: [place];  Service: General;  Laterality: N/A;  I & D soft tissue scrotal and perineal abscess   KNEE ARTHROSCOPY Right 02/05/2015   Procedure: ARTHROSCOPIC [facility] OUT RIGHT KNEE;  Surgeon: Renette Butters, MD;  Location: [place];  Service: Orthopedics;  Laterality: Right;   LAPAROSCOPIC DIVERTED COLOSTOMY  03/27/2012   Procedure: LAPAROSCOPIC DIVERTED COLOSTOMY;  Surgeon: [name], MD;  Location: [place];  Service: General;  Laterality: N/A;    Prior to Admission medications   Medication Sig Start Date End Date Taking? Authorizing Provider  abacavir-dolutegravir-lamiVUDine (TRIUMEQ) 600-50-300 MG tablet Take 1 tablet by mouth daily. 01/28/21  Yes Rosiland Oz, MD  amLODipine (NORVASC) 10 MG tablet TAKE 1 TABLET(10 MG) BY MOUTH DAILY Patient taking differently: Take 10 mg by mouth daily. 12/18/20  Yes [name], MD  lisinopril (ZESTRIL) 20 MG tablet TAKE 1 TABLET(20 MG) BY MOUTH DAILY Patient taking differently: Take 20 mg by mouth daily. 12/18/20  Yes [name], MD    Scheduled Meds:  abacavir-dolutegravir-lamiVUDine  1 tablet Oral Daily   Chlorhexidine Gluconate Cloth  6 each Topical Daily   pantoprazole (PROTONIX) IV  40 mg Intravenous Q12H   sodium chloride (PF)       Continuous  Infusions:  sodium chloride     sodium chloride 100 mL/hr at 02/26/21 0924   azithromycin Stopped (02/26/21 0748)   cefTRIAXone (ROCEPHIN)  IV Stopped (02/26/21 0645)   [START ON 02/27/2021] vancomycin     PRN Meds:.acetaminophen, docusate sodium, ondansetron (ZOFRAN) IV, polyethylene glycol  Allergies as of 02/25/2021   (No Known Allergies)    Family History  Problem Relation Age of Onset    Hypertension Mother    Diabetes Mother     Social History   Socioeconomic History   Marital status: Single    Spouse name: Not on file   Number of children: Not on file   Years of education: Not on file   Highest education level: Not on file  Occupational History   Not on file  Tobacco Use   Smoking status: Former    Packs/day: 0.25    Years: 15.00    Pack years: 3.75    Types: Cigarettes   Smokeless tobacco: Never   Tobacco comments:    less than 5 cigarettes a day  Vaping Use   Vaping Use: Never used  Substance and Sexual Activity   Alcohol use: Yes    Alcohol/week: 50.0 standard drinks    Types: 50 Cans of beer per week    Comment: 6-7 beers a day   Drug use: Not Currently    Types: Marijuana, Cocaine   Sexual activity: Not Currently    Partners: Female    Birth control/protection: Condom    Comment: declined condoms  Other Topics Concern   Not on file  Social History Narrative   Not on file   Social Determinants of Health   Financial Resource Strain: Not on file  Food Insecurity: Not on file  Transportation Needs: Not on file  Physical Activity: Not on file  Stress: Not on file  Social Connections: Not on file  Intimate Partner Violence: Not on file    Review of Systems: 12 point review of system is done which is negative except as mentioned in HPI  Physical Exam: Vital signs: Vitals:   02/26/21 0924 02/26/21 1216  BP: 126/83   Pulse: 95   Resp: (!) 29   Temp: 98 F (36.7 C) 98.3 F (36.8 C)  SpO2: 98%    Last BM Date: 02/26/21 Physical Exam Constitutional:      General: He is not in acute distress.    Appearance: He is well-developed. He is not ill-appearing.  HENT:     Head: Normocephalic and atraumatic.  Eyes:     Extraocular Movements: Extraocular movements intact.  Cardiovascular:     Rate and Rhythm: Normal rate and regular rhythm.     Heart sounds: No murmur heard. Pulmonary:     Effort: Pulmonary effort is normal. No  respiratory distress.  Abdominal:     General: Bowel sounds are normal.     Palpations: Abdomen is soft.     Tenderness: There is no abdominal tenderness. There is no guarding or rebound.  Musculoskeletal:     Cervical back: Normal range of motion.     Right lower leg: Edema present.     Left lower leg: Edema present.  Skin:    General: Skin is warm.  Neurological:     Mental Status: He is alert and oriented to person, place, and time.  Psychiatric:        Mood and Affect: Mood normal.        Behavior: Behavior normal.     GI:  Lab Results: Recent Labs    02/25/21 2349 02/26/21 0524 02/26/21 1042  WBC 11.2* 13.1*  --   HGB 4.2* 5.1* 6.2*  HCT 14.5* 17.0* 21.1*  PLT 413* 445*  --    BMET Recent Labs    02/25/21 2349 02/26/21 0524  NA 132* 133*  K 4.9 5.2*  CL 108 106  CO2 16* 17*  GLUCOSE 124* 110*  BUN 43* 42*  CREATININE 2.18* 1.89*  CALCIUM 8.2* 8.1*   LFT Recent Labs    02/25/21 2349  PROT 8.1  ALBUMIN 2.4*  AST 12*  ALT 7  ALKPHOS 84  BILITOT 0.5   PT/INR No results for input(s): LABPROT, INR in the last 72 hours.   Studies/Results: DG Chest 2 View  Result Date: 02/25/2021 CLINICAL DATA:  Shortness of breath EXAM: CHEST - 2 VIEW COMPARISON:  03/29/2012 FINDINGS: Ground-glass airspace disease at the right base suspicious for pneumonia. Borderline cardiomegaly. No significant effusion. No pneumothorax IMPRESSION: Findings suspicious for right lower lobe pneumonia Electronically Signed   By: Donavan Foil M.D.   On: 02/25/2021 23:42   CT Angio Chest PE W and/or Wo Contrast  Addendum Date: 02/26/2021   ADDENDUM REPORT: 02/26/2021 02:31 ADDENDUM: Critical findings were reported to Dr. Florina Ou at 2:25 a.m. Electronically Signed   By: Brett Fairy M.D.   On: 02/26/2021 02:31   Result Date: 02/26/2021 CLINICAL DATA:  Positive D-dimer, PE suspected, low to intermediate probability. Shortness of breath, cough, lower extremity edema for 2 weeks. EXAM:  CT ANGIOGRAPHY CHEST WITH CONTRAST TECHNIQUE: Multidetector CT imaging of the chest was performed using the standard protocol during bolus administration of intravenous contrast. Multiplanar CT image reconstructions and MIPs were obtained to evaluate the vascular anatomy. CONTRAST:  61mL OMNIPAQUE IOHEXOL 350 MG/ML SOLN COMPARISON:  02/25/2021. FINDINGS: Cardiovascular: The heart is normal in size and there is no pericardial effusion. No aortic aneurysm is seen. The pulmonary trunk is distended which may be associated with underlying pulmonary artery hypertension. There is a pulmonary artery filling defect in the right lower lobe lobar artery. Scattered segmental and subsegmental pulmonary artery filling defects are noted in the right upper lobe and left lower lobe. Examination is limited due to mixing artifact and respiratory motion. The right ventricle is mildly distended suggesting underlying right heart strain. Mediastinum/Nodes: Prominent lymph nodes are present at the right hilum. Shotty lymph nodes are seen in the mediastinum. No axillary or left hilar lymphadenopathy. The thyroid gland, trachea, and esophagus are within normal limits. Lungs/Pleura: Consolidation is noted in the right lower lobe in the region of pulmonary embolism, possible pneumonia versus pulmonary infarct. There is a vague ground-glass opacity in the left upper lobe measuring 1.5 cm. There is a small right pleural effusion. No pneumothorax is seen. Upper Abdomen: No acute abnormality. Musculoskeletal: Mild degenerative changes in the thoracic spine. No acute osseous abnormality is seen. Mild gynecomastia is noted bilaterally. Review of the MIP images confirms the above findings. IMPRESSION: 1. Bilateral pulmonary emboli, the largest in the right lower lobe. The right ventricle is distended suggesting underlying right heart strain. 2. Right lower lobe consolidation in the region of pulmonary embolism in the right lower lobe and ground-glass  opacity in the left upper lobe, which may represent infection versus pulmonary infarct. 3. Small right pleural effusion. Electronically Signed: By: Brett Fairy M.D. On: 02/26/2021 02:23    Impression/Plan: -Severe anemia with heme positive stool.  No overt bleeding. -Acute bilateral pulmonary embolism. -Shortness of breath : Probably  secondary to above. -Right lower lobe consolidation concern for infection versus infarction.  Recommendations ------------------------- -Patient's current hemoglobin is 6.2.  He will need another unit of blood transfusion.  He is also undergoing further work-up with echocardiogram today. -Plan is to start him on colonoscopy prep tomorrow and schedule him for EGD and colonoscopy for Saturday. -Discussed with Dr. Elsworth Soho.  -Change diet to full liquid -Monitor H&H.  Continue IV twice daily PPI -GI will follow    LOS: 0 days   Otis Brace  MD, FACP 02/26/2021, 12:21 PM  Contact #  [phone]

## 2021-02-26 NOTE — Progress Notes (Signed)
Blood gas venous result reported to Marisue Ivan, Charity fundraiser at [employer]. Contact phone number given for further instructions if needed. Otherwise, venous result will remain.

## 2021-02-26 NOTE — Progress Notes (Addendum)
IR aware of request for IVC filter. Chart reviewed. Admitted with PE and GI bleed. GI consult noted, plans for colonoscopy on 11/26 Upper Ext venous duplex and Echo ordered. Have made pt NPO p MN for potential procedure tomorrow.  Brayton El PA-C Interventional Radiology 02/26/2021 1:09 PM

## 2021-02-26 NOTE — H&P (View-Only) (Signed)
Referring Provider:  Dr. Elsworth Soho Primary Care Physician:  Pcp, No Primary Gastroenterologist: Althia Forts  Reason for Consultation: GI bleeding in setting of acute PE  HPI: Leon Nichols is a 55 y.o. male with past medical history of HIV currently on antiretroviral therapy presented to the hospital with 2 weeks of shortness of breath, right-sided chest pain and lower extremity swelling.    Upon initial evaluation, he was found to have severe anemia with hemoglobin of 4.2.  Normal LFTs.  CT chest showed bilateral pulmonary emboli along with a right lower lobe consolidation concerning for infection versus pulmonary infarct.  FOBT positive.  GI is consulted for severe anemia.  Colonoscopy in 2013 by Dr. Henrene Pastor showed normal terminal ileum, mild inflammation in the sigmoid colon probably from diverticular related colitis and hemorrhoids.  Biopsy showed active colitis with ulcerations.  Looks like patient was supposed to follow-up with Dr. Henrene Pastor as an outpatient but he did not follow through.   Patient seen and examined at bedside.  He denies any blood in the stool or black stool.  Denies any abdominal pain, nausea and vomiting.  Denies any change in bowel habits.  Started developing fatigue weakness and shortness of breath around 2 weeks ago.  He started taking Aleve last week because of chest pain and weakness.   Past Medical History:  Diagnosis Date   Acute back pain 10/28/2016   Colitis    Diverticulosis    Hemorrhoids    History of shingles    HIV (human immunodeficiency virus infection) ([place]) 03/24/2012   Hypertension    Perianal abscess 02/06/2015   Perirectal abscess    Septic arthritis ([place]) 02/05/2015    Past Surgical History:  Procedure Laterality Date   arthroscopic wash out of right knee     COLONOSCOPY  08/31/2011   Procedure: COLONOSCOPY;  Surgeon: [name], MD;  Location: [place];  Service: Endoscopy;  Laterality: N/A;   DRESSING CHANGE UNDER ANESTHESIA  03/27/2012    Procedure: DRESSING CHANGE UNDER ANESTHESIA;  Surgeon: [name], MD;  Location: [place];  Service: General;  Laterality: N/A;   INCISION AND DRAINAGE PERIRECTAL ABSCESS  03/23/2012   Procedure: IRRIGATION AND DEBRIDEMENT PERIRECTAL ABSCESS;  Surgeon: [name], MD;  Location: [place];  Service: General;  Laterality: N/A;  I & D soft tissue scrotal and perineal abscess   KNEE ARTHROSCOPY Right 02/05/2015   Procedure: ARTHROSCOPIC [facility] OUT RIGHT KNEE;  Surgeon: Renette Butters, MD;  Location: [place];  Service: Orthopedics;  Laterality: Right;   LAPAROSCOPIC DIVERTED COLOSTOMY  03/27/2012   Procedure: LAPAROSCOPIC DIVERTED COLOSTOMY;  Surgeon: [name], MD;  Location: [place];  Service: General;  Laterality: N/A;    Prior to Admission medications   Medication Sig Start Date End Date Taking? Authorizing Provider  abacavir-dolutegravir-lamiVUDine (TRIUMEQ) 600-50-300 MG tablet Take 1 tablet by mouth daily. 01/28/21  Yes Rosiland Oz, MD  amLODipine (NORVASC) 10 MG tablet TAKE 1 TABLET(10 MG) BY MOUTH DAILY Patient taking differently: Take 10 mg by mouth daily. 12/18/20  Yes [name], MD  lisinopril (ZESTRIL) 20 MG tablet TAKE 1 TABLET(20 MG) BY MOUTH DAILY Patient taking differently: Take 20 mg by mouth daily. 12/18/20  Yes [name], MD    Scheduled Meds:  abacavir-dolutegravir-lamiVUDine  1 tablet Oral Daily   Chlorhexidine Gluconate Cloth  6 each Topical Daily   pantoprazole (PROTONIX) IV  40 mg Intravenous Q12H   sodium chloride (PF)       Continuous  Infusions:  sodium chloride     sodium chloride 100 mL/hr at 02/26/21 0924   azithromycin Stopped (02/26/21 0748)   cefTRIAXone (ROCEPHIN)  IV Stopped (02/26/21 0645)   [START ON 02/27/2021] vancomycin     PRN Meds:.acetaminophen, docusate sodium, ondansetron (ZOFRAN) IV, polyethylene glycol  Allergies as of 02/25/2021   (No Known Allergies)    Family History  Problem Relation Age of Onset    Hypertension Mother    Diabetes Mother     Social History   Socioeconomic History   Marital status: Single    Spouse name: Not on file   Number of children: Not on file   Years of education: Not on file   Highest education level: Not on file  Occupational History   Not on file  Tobacco Use   Smoking status: Former    Packs/day: 0.25    Years: 15.00    Pack years: 3.75    Types: Cigarettes   Smokeless tobacco: Never   Tobacco comments:    less than 5 cigarettes a day  Vaping Use   Vaping Use: Never used  Substance and Sexual Activity   Alcohol use: Yes    Alcohol/week: 50.0 standard drinks    Types: 50 Cans of beer per week    Comment: 6-7 beers a day   Drug use: Not Currently    Types: Marijuana, Cocaine   Sexual activity: Not Currently    Partners: Female    Birth control/protection: Condom    Comment: declined condoms  Other Topics Concern   Not on file  Social History Narrative   Not on file   Social Determinants of Health   Financial Resource Strain: Not on file  Food Insecurity: Not on file  Transportation Needs: Not on file  Physical Activity: Not on file  Stress: Not on file  Social Connections: Not on file  Intimate Partner Violence: Not on file    Review of Systems: 12 point review of system is done which is negative except as mentioned in HPI  Physical Exam: Vital signs: Vitals:   02/26/21 0924 02/26/21 1216  BP: 126/83   Pulse: 95   Resp: (!) 29   Temp: 98 F (36.7 C) 98.3 F (36.8 C)  SpO2: 98%    Last BM Date: 02/26/21 Physical Exam Constitutional:      General: He is not in acute distress.    Appearance: He is well-developed. He is not ill-appearing.  HENT:     Head: Normocephalic and atraumatic.  Eyes:     Extraocular Movements: Extraocular movements intact.  Cardiovascular:     Rate and Rhythm: Normal rate and regular rhythm.     Heart sounds: No murmur heard. Pulmonary:     Effort: Pulmonary effort is normal. No  respiratory distress.  Abdominal:     General: Bowel sounds are normal.     Palpations: Abdomen is soft.     Tenderness: There is no abdominal tenderness. There is no guarding or rebound.  Musculoskeletal:     Cervical back: Normal range of motion.     Right lower leg: Edema present.     Left lower leg: Edema present.  Skin:    General: Skin is warm.  Neurological:     Mental Status: He is alert and oriented to person, place, and time.  Psychiatric:        Mood and Affect: Mood normal.        Behavior: Behavior normal.     GI:  Lab Results: Recent Labs    02/25/21 2349 02/26/21 0524 02/26/21 1042  WBC 11.2* 13.1*  --   HGB 4.2* 5.1* 6.2*  HCT 14.5* 17.0* 21.1*  PLT 413* 445*  --    BMET Recent Labs    02/25/21 2349 02/26/21 0524  NA 132* 133*  K 4.9 5.2*  CL 108 106  CO2 16* 17*  GLUCOSE 124* 110*  BUN 43* 42*  CREATININE 2.18* 1.89*  CALCIUM 8.2* 8.1*   LFT Recent Labs    02/25/21 2349  PROT 8.1  ALBUMIN 2.4*  AST 12*  ALT 7  ALKPHOS 84  BILITOT 0.5   PT/INR No results for input(s): LABPROT, INR in the last 72 hours.   Studies/Results: DG Chest 2 View  Result Date: 02/25/2021 CLINICAL DATA:  Shortness of breath EXAM: CHEST - 2 VIEW COMPARISON:  03/29/2012 FINDINGS: Ground-glass airspace disease at the right base suspicious for pneumonia. Borderline cardiomegaly. No significant effusion. No pneumothorax IMPRESSION: Findings suspicious for right lower lobe pneumonia Electronically Signed   By: Donavan Foil M.D.   On: 02/25/2021 23:42   CT Angio Chest PE W and/or Wo Contrast  Addendum Date: 02/26/2021   ADDENDUM REPORT: 02/26/2021 02:31 ADDENDUM: Critical findings were reported to Dr. Florina Ou at 2:25 a.m. Electronically Signed   By: Brett Fairy M.D.   On: 02/26/2021 02:31   Result Date: 02/26/2021 CLINICAL DATA:  Positive D-dimer, PE suspected, low to intermediate probability. Shortness of breath, cough, lower extremity edema for 2 weeks. EXAM:  CT ANGIOGRAPHY CHEST WITH CONTRAST TECHNIQUE: Multidetector CT imaging of the chest was performed using the standard protocol during bolus administration of intravenous contrast. Multiplanar CT image reconstructions and MIPs were obtained to evaluate the vascular anatomy. CONTRAST:  52mL OMNIPAQUE IOHEXOL 350 MG/ML SOLN COMPARISON:  02/25/2021. FINDINGS: Cardiovascular: The heart is normal in size and there is no pericardial effusion. No aortic aneurysm is seen. The pulmonary trunk is distended which may be associated with underlying pulmonary artery hypertension. There is a pulmonary artery filling defect in the right lower lobe lobar artery. Scattered segmental and subsegmental pulmonary artery filling defects are noted in the right upper lobe and left lower lobe. Examination is limited due to mixing artifact and respiratory motion. The right ventricle is mildly distended suggesting underlying right heart strain. Mediastinum/Nodes: Prominent lymph nodes are present at the right hilum. Shotty lymph nodes are seen in the mediastinum. No axillary or left hilar lymphadenopathy. The thyroid gland, trachea, and esophagus are within normal limits. Lungs/Pleura: Consolidation is noted in the right lower lobe in the region of pulmonary embolism, possible pneumonia versus pulmonary infarct. There is a vague ground-glass opacity in the left upper lobe measuring 1.5 cm. There is a small right pleural effusion. No pneumothorax is seen. Upper Abdomen: No acute abnormality. Musculoskeletal: Mild degenerative changes in the thoracic spine. No acute osseous abnormality is seen. Mild gynecomastia is noted bilaterally. Review of the MIP images confirms the above findings. IMPRESSION: 1. Bilateral pulmonary emboli, the largest in the right lower lobe. The right ventricle is distended suggesting underlying right heart strain. 2. Right lower lobe consolidation in the region of pulmonary embolism in the right lower lobe and ground-glass  opacity in the left upper lobe, which may represent infection versus pulmonary infarct. 3. Small right pleural effusion. Electronically Signed: By: Brett Fairy M.D. On: 02/26/2021 02:23    Impression/Plan: -Severe anemia with heme positive stool.  No overt bleeding. -Acute bilateral pulmonary embolism. -Shortness of breath : Probably  secondary to above. -Right lower lobe consolidation concern for infection versus infarction.  Recommendations ------------------------- -Patient's current hemoglobin is 6.2.  He will need another unit of blood transfusion.  He is also undergoing further work-up with echocardiogram today. -Plan is to start him on colonoscopy prep tomorrow and schedule him for EGD and colonoscopy for Saturday. -Discussed with Dr. Elsworth Soho.  -Change diet to full liquid -Monitor H&H.  Continue IV twice daily PPI -GI will follow    LOS: 0 days   Otis Brace  MD, FACP 02/26/2021, 12:21 PM  Contact #  [phone]

## 2021-02-27 ENCOUNTER — Inpatient Hospital Stay (HOSPITAL_COMMUNITY): Payer: Medicare Other

## 2021-02-27 ENCOUNTER — Other Ambulatory Visit: Payer: Self-pay

## 2021-02-27 DIAGNOSIS — K922 Gastrointestinal hemorrhage, unspecified: Secondary | ICD-10-CM

## 2021-02-27 DIAGNOSIS — I2699 Other pulmonary embolism without acute cor pulmonale: Secondary | ICD-10-CM | POA: Diagnosis not present

## 2021-02-27 HISTORY — PX: IR IVC FILTER PLMT / S&I /IMG GUID/MOD SED: IMG701

## 2021-02-27 LAB — ANA W/REFLEX IF POSITIVE: Anti Nuclear Antibody (ANA): NEGATIVE

## 2021-02-27 LAB — CBC
HCT: 23.9 % — ABNORMAL LOW (ref 39.0–52.0)
Hemoglobin: 7.4 g/dL — ABNORMAL LOW (ref 13.0–17.0)
MCH: 28.6 pg (ref 26.0–34.0)
MCHC: 31 g/dL (ref 30.0–36.0)
MCV: 92.3 fL (ref 80.0–100.0)
Platelets: 394 10*3/uL (ref 150–400)
RBC: 2.59 MIL/uL — ABNORMAL LOW (ref 4.22–5.81)
RDW: 18.1 % — ABNORMAL HIGH (ref 11.5–15.5)
WBC: 13.9 10*3/uL — ABNORMAL HIGH (ref 4.0–10.5)
nRBC: 0 % (ref 0.0–0.2)

## 2021-02-27 LAB — CBC WITH DIFFERENTIAL/PLATELET
Abs Immature Granulocytes: 0.04 10*3/uL (ref 0.00–0.07)
Abs Immature Granulocytes: 0.08 10*3/uL — ABNORMAL HIGH (ref 0.00–0.07)
Basophils Absolute: 0 10*3/uL (ref 0.0–0.1)
Basophils Absolute: 0 10*3/uL (ref 0.0–0.1)
Basophils Relative: 0 %
Basophils Relative: 0 %
Eosinophils Absolute: 0 10*3/uL (ref 0.0–0.5)
Eosinophils Absolute: 0.1 10*3/uL (ref 0.0–0.5)
Eosinophils Relative: 0 %
Eosinophils Relative: 1 %
HCT: 40.1 % (ref 39.0–52.0)
HCT: 24.8 % — ABNORMAL LOW (ref 39.0–52.0)
Hemoglobin: 11.5 g/dL — ABNORMAL LOW (ref 13.0–17.0)
Hemoglobin: 7.7 g/dL — ABNORMAL LOW (ref 13.0–17.0)
Immature Granulocytes: 0 %
Immature Granulocytes: 1 %
Lymphocytes Relative: 23 %
Lymphocytes Relative: 16 %
Lymphs Abs: 2.3 10*3/uL (ref 0.7–4.0)
Lymphs Abs: 2.1 10*3/uL (ref 0.7–4.0)
MCH: 28.2 pg (ref 26.0–34.0)
MCH: 28.5 pg (ref 26.0–34.0)
MCHC: 28.7 g/dL — ABNORMAL LOW (ref 30.0–36.0)
MCHC: 31 g/dL (ref 30.0–36.0)
MCV: 98.3 fL (ref 80.0–100.0)
MCV: 91.9 fL (ref 80.0–100.0)
Monocytes Absolute: 0.5 10*3/uL (ref 0.1–1.0)
Monocytes Absolute: 0.8 10*3/uL (ref 0.1–1.0)
Monocytes Relative: 5 %
Monocytes Relative: 6 %
Neutro Abs: 7 10*3/uL (ref 1.7–7.7)
Neutro Abs: 10.3 10*3/uL — ABNORMAL HIGH (ref 1.7–7.7)
Neutrophils Relative %: 72 %
Neutrophils Relative %: 76 %
Platelets: 296 10*3/uL (ref 150–400)
Platelets: 470 10*3/uL — ABNORMAL HIGH (ref 150–400)
RBC: 4.08 MIL/uL — ABNORMAL LOW (ref 4.22–5.81)
RBC: 2.7 MIL/uL — ABNORMAL LOW (ref 4.22–5.81)
RDW: 18.1 % — ABNORMAL HIGH (ref 11.5–15.5)
RDW: 17.9 % — ABNORMAL HIGH (ref 11.5–15.5)
WBC: 9.9 10*3/uL (ref 4.0–10.5)
WBC: 13.3 10*3/uL — ABNORMAL HIGH (ref 4.0–10.5)
nRBC: 0 % (ref 0.0–0.2)
nRBC: 0 % (ref 0.0–0.2)

## 2021-02-27 LAB — TYPE AND SCREEN
ABO/RH(D): O POS
Antibody Screen: NEGATIVE
Unit division: 0
Unit division: 0
Unit division: 0
Unit division: 0

## 2021-02-27 LAB — BASIC METABOLIC PANEL
Anion gap: 6 (ref 5–15)
Anion gap: 8 (ref 5–15)
BUN: 24 mg/dL — ABNORMAL HIGH (ref 6–20)
BUN: 21 mg/dL — ABNORMAL HIGH (ref 6–20)
CO2: 17 mmol/L — ABNORMAL LOW (ref 22–32)
CO2: 16 mmol/L — ABNORMAL LOW (ref 22–32)
Calcium: 8.3 mg/dL — ABNORMAL LOW (ref 8.9–10.3)
Calcium: 8.5 mg/dL — ABNORMAL LOW (ref 8.9–10.3)
Chloride: 111 mmol/L (ref 98–111)
Chloride: 110 mmol/L (ref 98–111)
Creatinine, Ser: 1.24 mg/dL (ref 0.61–1.24)
Creatinine, Ser: 1.13 mg/dL (ref 0.61–1.24)
GFR, Estimated: >60 mL/min (ref >60)
GFR, Estimated: >60 mL/min (ref >60)
Glucose, Bld: 105 mg/dL — ABNORMAL HIGH (ref 70–99)
Glucose, Bld: 104 mg/dL — ABNORMAL HIGH (ref 70–99)
Potassium: 5.1 mmol/L (ref 3.5–5.1)
Potassium: 4.9 mmol/L (ref 3.5–5.1)
Sodium: 134 mmol/L — ABNORMAL LOW (ref 135–145)
Sodium: 134 mmol/L — ABNORMAL LOW (ref 135–145)

## 2021-02-27 LAB — BPAM RBC
Blood Product Expiration Date: 202212272359
Blood Product Expiration Date: 202212272359
Blood Product Expiration Date: 202212272359
Blood Product Expiration Date: 202212272359
ISSUE DATE / TIME: 202211240256
ISSUE DATE / TIME: 202211240537
ISSUE DATE / TIME: 202211241448
ISSUE DATE / TIME: 202211241824
Unit Type and Rh: 5100
Unit Type and Rh: 5100
Unit Type and Rh: 5100
Unit Type and Rh: 5100

## 2021-02-27 LAB — URINE CULTURE: Culture: NO GROWTH

## 2021-02-27 LAB — HAPTOGLOBIN: Haptoglobin: 394 mg/dL — ABNORMAL HIGH (ref 29–370)

## 2021-02-27 MED ORDER — FENTANYL CITRATE (PF) 100 MCG/2ML IJ SOLN
INTRAMUSCULAR | Status: AC | PRN
  Administered 2021-02-27 (×2): 50 ug via INTRAVENOUS

## 2021-02-27 MED ORDER — MIDAZOLAM HCL 2 MG/2ML IJ SOLN
INTRAMUSCULAR | Status: AC | PRN
  Administered 2021-02-27 (×2): 1 mg via INTRAVENOUS

## 2021-02-27 MED ORDER — MIDAZOLAM HCL 2 MG/2ML IJ SOLN
INTRAMUSCULAR | Status: AC
  Filled 2021-02-27: qty 4

## 2021-02-27 MED ORDER — LIDOCAINE HCL (PF) 1 % IJ SOLN
INTRAMUSCULAR | Status: AC | PRN
  Administered 2021-02-27: 5 mL

## 2021-02-27 MED ORDER — FENTANYL CITRATE (PF) 100 MCG/2ML IJ SOLN
INTRAMUSCULAR | Status: AC
  Filled 2021-02-27: qty 2

## 2021-02-27 MED ORDER — PEG 3350-KCL-NA BICARB-NACL 420 G PO SOLR
4000.0000 mL | Freq: Once | ORAL | Status: AC
  Administered 2021-02-27: 4000 mL via ORAL
  Filled 2021-02-27: qty 4000

## 2021-02-27 MED ORDER — LIDOCAINE HCL 1 % IJ SOLN
INTRAMUSCULAR | Status: AC
  Filled 2021-02-27: qty 20

## 2021-02-27 NOTE — Progress Notes (Addendum)
NAME:  Leon Nichols MRN:  [id number] DOB:  19-May-1965 LOS: 1 ADMISSION DATE:  02/25/2021 DATE OF SERVICE: 02/27/2021 CHIEF COMPLAINT:  dyspnea   History of Present Illness  55 y.o. M, refomed smoker, who presented to [facility] ER on 11/23 with complaints of rapidly progressive dyspnea, right lower chest pleuritic pain and bilateral lower extremity edema. Work up included a CTA chest which showed right lower lobe consolidation, right pleural effusion in addition to the pulmonary emboli.  Laboratory evaluation also revealed that the patient was anemic (Hgb 4.2) with positive fecal occult blood on exam.  Transfused 4 units PRBC's for resuscitation.  GI evaluated patient and pending colonoscopy on 11/26. Pending IR placement of IVC filter.   Past Medical Hx  HTN HIV Shingles  Septic Arthritis  Diverticulosis  Significant Diagnostic Tests:  11/23 Admit  11/24 CTA chest shows bilateral pulmonary emboli, RLL consolidation and tiny pleural effusion. 11/25 BP, Hgb stable. On RA.    Interim history/subjective:  Pt denies shortness of breath. Reports occasional right sided chest pain.  On RA  Afebrile / BP stable   Objective   BP (!) 143/90 (BP Location: Right Arm)   Pulse (!) 102   Temp 98.4 F (36.9 C) (Oral)   Resp (!) 40   Ht 6\' 1"  (1.854 m)   Wt 104.3 kg   SpO2 97%   BMI 30.34 kg/m     Filed Weights   02/25/21 2322 02/27/21 0500  Weight: 104.3 kg 104.3 kg    Intake/Output Summary (Last 24 hours) at 02/27/2021 0720 Last data filed at 02/27/2021 0631 Gross per 24 hour  Intake 4134.92 ml  Output 2775 ml  Net 1359.92 ml        Examination: General: adult male sitting up in bed watching TV in NAD HEENT: MM pink/moist, dark circular discoloration on right lateral tongue, anicteric  Neuro: AAOx4, speech clear, MAE  CV: s1s2 RRR, ST on monitor, no m/r/g PULM: non-labored at rest, lungs bilaterally clear GI: soft, bsx4 active Extremities: warm/dry, no edema  Skin: no rashes  or lesions  Resolved Hospital Problem list      Assessment & Plan:   GIB Acute Blood Loss Anemia  Hx Diverting Colostomy  FOBT positive on admit.  Normal LFT's.  Last colonoscopy in 2-13 with active colitis with ulcerations, pt did not follow up.   -appreciate GI consultation, plan for colonoscopy on 11/26 by Dr. 12/26  -monitor for further bleeding  -follow Hgb/Hct, currently stable.  Note erroneous reading 11/25 am with repeat more in line with baseline/hx -colostomy care per protocol  -no anticoagulation given bleeding, profound anemia  -PPI   Acute Pulmonary Embolism  RLL Consolidation  CT chest 11/24 with bilateral pulmonary emboli, RLL consolidation.  ECHO with LVEF 50-55%, no RWMA, RV systolic function moderately reduced, mildly elevated pulmonary artery pressure, ~RVSP 43.5.  Note enlarged pulmonary trunk on CT (approx same size as aorta, ? Underlying longstanding changes). LE venous duplex negative for DVT. -appreciate IR consultation, pending IVC filter placement -pulmonary hygiene -IS, mobilize -continue azithro/rocephin, D2/x abx.  Consider 5 days therapy.  Tachycardia  Suspect reactive in setting of anemia  -tele monitoring   AKI  Hyponatremia On ACE-I  -hold home ACE -Trend BMP / urinary output -Replace electrolytes as indicated -Avoid nephrotoxic agents, ensure adequate renal perfusion  HTN -hold home norvasc, lisinopril with bleeding, AKI   HIV  -continue Triumeq   Best practice:  Diet: renal DVT prophylaxis: SCDs GI prophylaxis: Protonix  Glucose control: N/A Mobility/Activity: as tolerated  Code Status: Full Code  Family Communication:  Patient updated on plan of care 11/25 Disposition: SDU, to [facility] as of 11/26 am.    Critical care time: n/a    Noe Gens, MSN, APRN, NP-C, AGACNP-BC  Pulmonary & Critical Care 02/27/2021, 7:20 AM   Please see [email or web address] for pager details.   From 7A-7P if no response, please call  [phone] After hours, please call ELink [phone]

## 2021-02-27 NOTE — Progress Notes (Signed)
-   Patient currently not in the room.  Looks like he is currently getting IVC filter placement in interventional radiology department.  -Hemoglobin improved to 7.7 after 4 units of blood transfusion.  -Plan for EGD and colonoscopy tomorrow.  -Okay to have clear liquid diet today.  Keep n.p.o. past midnight  Kathi Der MD, FACP 02/27/2021, 1:22 PM  Contact #  [phone]

## 2021-02-27 NOTE — Consult Note (Signed)
Chief Complaint: Patient was seen in consultation today for IVC filter placement Chief Complaint  Patient presents with   Shortness of Breath     Referring Physician(s): [place]  Supervising Physician: Michaelle Birks  Patient Status: [facility] - In-pt  History of Present Illness: Leon Nichols is a 55 y.o. male, former smoker, with past medical history significant for diverticulosis, shingles, HIV, hypertension, perianal/perirectal abscesses, history of laparoscopic diverting sigmoid colostomy 2013, and arthritis who recently presented to [facility] with dyspnea, right-sided chest pain and lower extremity swelling.  Subsequently found to be anemic with hemoglobin of 4.2.  CT chest showed bilateral pulmonary emboli along with right lower lobe consolidation concerning for infection versus pulmonary infarct.  Fecal occult blood test was positive .  No overt bleeding noted.  He has been transfused PRBC's.  He has been scheduled for EGD and colonoscopy tomorrow.  Lower extremity venous Doppler study yesterday revealed no evidence of DVT bilaterally.  Current labs include WBC 13.3, hemoglobin 7.7, down from 11.5 yesterday, platelets 470K, creatinine 1.13.  Request now received from critical care team for IVC filter placement.  Past Medical History:  Diagnosis Date   Acute back pain 10/28/2016   Colitis    Diverticulosis    Hemorrhoids    History of shingles    HIV (human immunodeficiency virus infection) ([place]) 03/24/2012   Hypertension    Perianal abscess 02/06/2015   Perirectal abscess    Septic arthritis ([place]) 02/05/2015    Past Surgical History:  Procedure Laterality Date   arthroscopic wash out of right knee     COLONOSCOPY  08/31/2011   Procedure: COLONOSCOPY;  Surgeon: [name], MD;  Location: [place];  Service: Endoscopy;  Laterality: N/A;   DRESSING CHANGE UNDER ANESTHESIA  03/27/2012   Procedure: DRESSING CHANGE UNDER ANESTHESIA;  Surgeon: [name], MD;   Location: [place];  Service: General;  Laterality: N/A;   INCISION AND DRAINAGE PERIRECTAL ABSCESS  03/23/2012   Procedure: IRRIGATION AND DEBRIDEMENT PERIRECTAL ABSCESS;  Surgeon: [name], MD;  Location: [place];  Service: General;  Laterality: N/A;  I & D soft tissue scrotal and perineal abscess   KNEE ARTHROSCOPY Right 02/05/2015   Procedure: ARTHROSCOPIC [facility] OUT RIGHT KNEE;  Surgeon: Renette Butters, MD;  Location: [place];  Service: Orthopedics;  Laterality: Right;   LAPAROSCOPIC DIVERTED COLOSTOMY  03/27/2012   Procedure: LAPAROSCOPIC DIVERTED COLOSTOMY;  Surgeon: [name], MD;  Location: [place];  Service: General;  Laterality: N/A;    Allergies: Patient has no known allergies.  Medications: Prior to Admission medications   Medication Sig Start Date End Date Taking? Authorizing Provider  abacavir-dolutegravir-lamiVUDine (TRIUMEQ) 600-50-300 MG tablet Take 1 tablet by mouth daily. 01/28/21  Yes Rosiland Oz, MD  amLODipine (NORVASC) 10 MG tablet TAKE 1 TABLET(10 MG) BY MOUTH DAILY Patient taking differently: Take 10 mg by mouth daily. 12/18/20  Yes [name], MD  lisinopril (ZESTRIL) 20 MG tablet TAKE 1 TABLET(20 MG) BY MOUTH DAILY Patient taking differently: Take 20 mg by mouth daily. 12/18/20  Yes [name], MD     Family History  Problem Relation Age of Onset   Hypertension Mother    Diabetes Mother     Social History   Socioeconomic History   Marital status: Single    Spouse name: Not on file   Number of children: Not on file   Years of education: Not on file   Highest education level: Not on file  Occupational History   Not on file  Tobacco Use   Smoking status: Former    Packs/day: 0.25    Years: 15.00    Pack years: 3.75    Types: Cigarettes   Smokeless tobacco: Never   Tobacco comments:    less than 5 cigarettes a day  Vaping Use   Vaping Use: Never used  Substance and Sexual Activity   Alcohol use: Yes    Alcohol/week:  50.0 standard drinks    Types: 50 Cans of beer per week    Comment: 6-7 beers a day   Drug use: Not Currently    Types: Marijuana, Cocaine   Sexual activity: Not Currently    Partners: Female    Birth control/protection: Condom    Comment: declined condoms  Other Topics Concern   Not on file  Social History Narrative   Not on file   Social Determinants of Health   Financial Resource Strain: Not on file  Food Insecurity: Not on file  Transportation Needs: Not on file  Physical Activity: Not on file  Stress: Not on file  Social Connections: Not on file      Review of Systems see above: Currently denies fever, headache, abdominal/back pain, nausea, vomiting or visible bleeding.  Continues to have occasional cough, some right-sided chest discomfort and mild dyspnea.  Vital Signs: BP (!) 150/95 (BP Location: Right Arm)   Pulse (!) 108   Temp 98.8 F (37.1 C) (Oral)   Resp (!) 24   Ht 6\' 1"  (1.854 m)   Wt 230 lb (104.3 kg)   SpO2 96%   BMI 30.34 kg/m   Physical Exam awake, alert.  Chest with diminished breath sounds right base, left clear.  Heart with slightly tachycardic but regular rhythm.Abdomen soft, positive bowel sounds, nontender, left lower quadrant colostomy  ; some trace-1+ pretibial edema bilaterally   Imaging: DG Chest 2 View  Result Date: 02/25/2021 CLINICAL DATA:  Shortness of breath EXAM: CHEST - 2 VIEW COMPARISON:  03/29/2012 FINDINGS: Ground-glass airspace disease at the right base suspicious for pneumonia. Borderline cardiomegaly. No significant effusion. No pneumothorax IMPRESSION: Findings suspicious for right lower lobe pneumonia Electronically Signed   By: Donavan Foil M.D.   On: 02/25/2021 23:42   CT Angio Chest PE W and/or Wo Contrast  Addendum Date: 02/26/2021   ADDENDUM REPORT: 02/26/2021 02:31 ADDENDUM: Critical findings were reported to Dr. Florina Ou at 2:25 a.m. Electronically Signed   By: Brett Fairy M.D.   On: 02/26/2021 02:31   Result  Date: 02/26/2021 CLINICAL DATA:  Positive D-dimer, PE suspected, low to intermediate probability. Shortness of breath, cough, lower extremity edema for 2 weeks. EXAM: CT ANGIOGRAPHY CHEST WITH CONTRAST TECHNIQUE: Multidetector CT imaging of the chest was performed using the standard protocol during bolus administration of intravenous contrast. Multiplanar CT image reconstructions and MIPs were obtained to evaluate the vascular anatomy. CONTRAST:  12mL OMNIPAQUE IOHEXOL 350 MG/ML SOLN COMPARISON:  02/25/2021. FINDINGS: Cardiovascular: The heart is normal in size and there is no pericardial effusion. No aortic aneurysm is seen. The pulmonary trunk is distended which may be associated with underlying pulmonary artery hypertension. There is a pulmonary artery filling defect in the right lower lobe lobar artery. Scattered segmental and subsegmental pulmonary artery filling defects are noted in the right upper lobe and left lower lobe. Examination is limited due to mixing artifact and respiratory motion. The right ventricle is mildly distended suggesting underlying right heart strain. Mediastinum/Nodes: Prominent lymph nodes are present at  the right hilum. Shotty lymph nodes are seen in the mediastinum. No axillary or left hilar lymphadenopathy. The thyroid gland, trachea, and esophagus are within normal limits. Lungs/Pleura: Consolidation is noted in the right lower lobe in the region of pulmonary embolism, possible pneumonia versus pulmonary infarct. There is a vague ground-glass opacity in the left upper lobe measuring 1.5 cm. There is a small right pleural effusion. No pneumothorax is seen. Upper Abdomen: No acute abnormality. Musculoskeletal: Mild degenerative changes in the thoracic spine. No acute osseous abnormality is seen. Mild gynecomastia is noted bilaterally. Review of the MIP images confirms the above findings. IMPRESSION: 1. Bilateral pulmonary emboli, the largest in the right lower lobe. The right  ventricle is distended suggesting underlying right heart strain. 2. Right lower lobe consolidation in the region of pulmonary embolism in the right lower lobe and ground-glass opacity in the left upper lobe, which may represent infection versus pulmonary infarct. 3. Small right pleural effusion. Electronically Signed: By: Brett Fairy M.D. On: 02/26/2021 02:23   ECHOCARDIOGRAM COMPLETE  Result Date: 02/26/2021    ECHOCARDIOGRAM REPORT   Patient Name:   Leon Nichols Date of Exam: 02/26/2021 Medical Rec #:  [id number]      Height:       73.0 in Accession #:    [id number]     Weight:       230.0 lb Date of Birth:  [birth year]      BSA:          2.283 m Patient Age:    82 years       BP:           134/81 mmHg Patient Gender: M              HR:           91 bpm. Exam Location:  Inpatient Procedure: 2D Echo, Cardiac Doppler, Color Doppler and Intracardiac            Opacification Agent Indications:    Pulmonary Embolus I26.09  History:        Patient has no prior history of Echocardiogram examinations.                 Risk Factors:Hypertension.  Sonographer:    Darlina Sicilian RDCS Referring Phys: [place]  1. Left ventricular ejection fraction, by estimation, is 50 to 55%. The left ventricle has low normal function. The left ventricle has no regional wall motion abnormalities. There is mild left ventricular hypertrophy. Left ventricular diastolic parameters were normal.  2. Right ventricular systolic function is moderately reduced. The right ventricular size is mildly enlarged. There is mildly elevated pulmonary artery systolic pressure. The estimated right ventricular systolic pressure is [id number] mmHg.  3. The mitral valve is normal in structure. Trivial mitral valve regurgitation. No evidence of mitral stenosis.  4. The aortic valve is tricuspid. Aortic valve regurgitation is not visualized. No aortic stenosis is present.  5. Pulmonic valve regurgitation is severe.  6. The inferior vena cava is  dilated in size with <50% respiratory variability, suggesting right atrial pressure of 15 mmHg. FINDINGS  Left Ventricle: Left ventricular ejection fraction, by estimation, is 50 to 55%. The left ventricle has low normal function. The left ventricle has no regional wall motion abnormalities. Definity contrast agent was given IV to delineate the left ventricular endocardial borders. The left ventricular internal cavity size was normal in size. There is mild left ventricular hypertrophy. Left ventricular diastolic parameters were  normal. Right Ventricle: The right ventricular size is mildly enlarged. Right vetricular wall thickness was not well visualized. Right ventricular systolic function is moderately reduced. There is mildly elevated pulmonary artery systolic pressure. The tricuspid  regurgitant velocity is 2.67 m/s, and with an assumed right atrial pressure of 15 mmHg, the estimated right ventricular systolic pressure is [id number] mmHg. Left Atrium: Left atrial size was normal in size. Right Atrium: Right atrial size was normal in size. Pericardium: Trivial pericardial effusion is present. Mitral Valve: The mitral valve is normal in structure. Trivial mitral valve regurgitation. No evidence of mitral valve stenosis. Tricuspid Valve: The tricuspid valve is normal in structure. Tricuspid valve regurgitation is mild . No evidence of tricuspid stenosis. Aortic Valve: The aortic valve is tricuspid. Aortic valve regurgitation is not visualized. No aortic stenosis is present. Pulmonic Valve: The pulmonic valve was normal in structure. Pulmonic valve regurgitation is severe. No evidence of pulmonic stenosis. Aorta: The aortic root is normal in size and structure. Venous: The inferior vena cava is dilated in size with less than 50% respiratory variability, suggesting right atrial pressure of 15 mmHg. IAS/Shunts: No atrial level shunt detected by color flow Doppler.  LEFT VENTRICLE PLAX 2D LVIDd:         4.80 cm       Diastology LVIDs:         3.00 cm      LV e' medial:    9.95 cm/s LV PW:         1.10 cm      LV E/e' medial:  10.1 LV IVS:        1.20 cm      LV e' lateral:   16.25 cm/s LVOT diam:     2.20 cm      LV E/e' lateral: 6.2 LV SV:         67 LV SV Index:   29 LVOT Area:     3.80 cm  LV Volumes (MOD) LV vol d, MOD A2C: 89.2 ml LV vol d, MOD A4C: 122.0 ml LV vol s, MOD A2C: 34.6 ml LV vol s, MOD A4C: 45.7 ml LV SV MOD A2C:     54.6 ml LV SV MOD A4C:     122.0 ml LV SV MOD BP:      62.7 ml RIGHT VENTRICLE RV S prime:     15.40 cm/s TAPSE (M-mode): 1.9 cm LEFT ATRIUM             Index        RIGHT ATRIUM           Index LA diam:        4.20 cm 1.84 cm/m   RA Area:     21.80 cm LA Vol (A2C):   58.5 ml 25.62 ml/m  RA Volume:   63.20 ml  27.68 ml/m LA Vol (A4C):   53.6 ml 23.47 ml/m LA Biplane Vol: 58.8 ml 25.75 ml/m  AORTIC VALVE             PULMONIC VALVE LVOT Vmax:   112.00 cm/s RVOT Peak grad: 3 mmHg LVOT Vmean:  67.900 cm/s LVOT VTI:    0.175 m  AORTA Ao Root diam: 3.00 cm Ao Asc diam:  3.40 cm MITRAL VALVE                TRICUSPID VALVE MV Area (PHT): 5.16 cm     TR Peak grad:   28.5 mmHg MV Decel Time: 147 msec  TR Vmax:        267.00 cm/s MV E velocity: 100.15 cm/s MV A velocity: 47.75 cm/s   SHUNTS MV E/A ratio:  2.10         Systemic VTI:  0.18 m                             Systemic Diam: 2.20 cm                             Pulmonic VTI:  0.182 m Cherlynn Kaiser MD Electronically signed by Cherlynn Kaiser MD Signature Date/Time: 02/26/2021/5:54:05 PM    Final    VAS [place] LOWER EXTREMITY VENOUS (DVT)  Result Date: 02/26/2021  Lower Venous DVT Study Patient Name:  URIAS CAIN  Date of Exam:   02/26/2021 Medical Rec #: [id number]       Accession #:    [id number] Date of Birth: 1965/05/31       Patient Gender: M Patient Age:   20 years Exam Location:  [facility] Procedure:      VAS [place] LOWER EXTREMITY VENOUS (DVT) Referring Phys: [facility] ALVA  --------------------------------------------------------------------------------  Indications: Pulmonary embolism.  Comparison Study: No previous exams Performing Technologist: Jody Hill RVT, RDMS  Examination Guidelines: A complete evaluation includes B-mode imaging, spectral Doppler, color Doppler, and power Doppler as needed of all accessible portions of each vessel. Bilateral testing is considered an integral part of a complete examination. Limited examinations for reoccurring indications may be performed as noted. The reflux portion of the exam is performed with the patient in reverse Trendelenburg.  +---------+---------------+---------+-----------+----------+--------------+ RIGHT    CompressibilityPhasicitySpontaneityPropertiesThrombus Aging +---------+---------------+---------+-----------+----------+--------------+ CFV      Full           Yes      Yes                                 +---------+---------------+---------+-----------+----------+--------------+ SFJ      Full                                                        +---------+---------------+---------+-----------+----------+--------------+ FV Prox  Full           Yes      Yes                                 +---------+---------------+---------+-----------+----------+--------------+ FV Mid   Full           Yes      Yes                                 +---------+---------------+---------+-----------+----------+--------------+ FV DistalFull           Yes      Yes                                 +---------+---------------+---------+-----------+----------+--------------+ PFV      Full                                                        +---------+---------------+---------+-----------+----------+--------------+  POP      Full           Yes      Yes                                 +---------+---------------+---------+-----------+----------+--------------+ PTV      Full                                                         +---------+---------------+---------+-----------+----------+--------------+ PERO     Full                                                        +---------+---------------+---------+-----------+----------+--------------+   +---------+---------------+---------+-----------+----------+--------------+ LEFT     CompressibilityPhasicitySpontaneityPropertiesThrombus Aging +---------+---------------+---------+-----------+----------+--------------+ CFV      Full           Yes      Yes                                 +---------+---------------+---------+-----------+----------+--------------+ SFJ      Full                                                        +---------+---------------+---------+-----------+----------+--------------+ FV Prox  Full           Yes      Yes                                 +---------+---------------+---------+-----------+----------+--------------+ FV Mid   Full           Yes      Yes                                 +---------+---------------+---------+-----------+----------+--------------+ FV DistalFull           Yes      Yes                                 +---------+---------------+---------+-----------+----------+--------------+ PFV      Full                                                        +---------+---------------+---------+-----------+----------+--------------+ POP      Full           Yes      Yes                                 +---------+---------------+---------+-----------+----------+--------------+ PTV  Full                                                        +---------+---------------+---------+-----------+----------+--------------+ PERO     Full                                                        +---------+---------------+---------+-----------+----------+--------------+    Summary: BILATERAL: - No evidence of deep vein thrombosis seen in the lower extremities,  bilaterally. - No evidence of superficial venous thrombosis in the lower extremities, bilaterally. -No evidence of popliteal cyst, bilaterally.  LEFT: Subcutaneous edema seen in left calf.  *See table(s) above for measurements and observations.    Preliminary     Labs:  CBC: Recent Labs    02/25/21 2349 02/26/21 0524 02/26/21 1042 02/26/21 1356 02/26/21 2211 02/27/21 0426 02/27/21 0646  WBC 11.2* 13.1*  --   --   --  9.9 13.3*  HGB 4.2* 5.1*   < > 5.8* 7.5* 11.5* 7.7*  HCT 14.5* 17.0*   < > 19.5* 24.1* 40.1 24.8*  PLT 413* 445*  --   --   --  296 470*   < > = values in this interval not displayed.    COAGS: No results for input(s): INR, APTT in the last 8760 hours.  BMP: Recent Labs    05/08/20 1642 02/25/21 2349 02/26/21 0524 02/27/21 0248 02/27/21 0646  NA 134* 132* 133* 134* 134*  K 4.4 4.9 5.2* 5.1 4.9  CL 100 108 106 111 110  CO2 26 16* 17* 17* 16*  GLUCOSE 130* 124* 110* 105* 104*  BUN 8 43* 42* 24* 21*  CALCIUM 9.2 8.2* 8.1* 8.3* 8.5*  CREATININE 0.78 2.18* 1.89* 1.24 1.13  GFRNONAA 104 35* 42* >60 >60  GFRAA 120  --   --   --   --     LIVER FUNCTION TESTS: Recent Labs    05/08/20 1642 02/25/21 2349  BILITOT 0.3 0.5  AST 13 12*  ALT 9 7  ALKPHOS  --  84  PROT 7.7 8.1  ALBUMIN  --  2.4*    TUMOR MARKERS: No results for input(s): AFPTM, CEA, CA199, CHROMGRNA in the last 8760 hours.  Assessment and Plan: 55 y.o. male, former smoker, with past medical history significant for diverticulosis, shingles, HIV, hypertension, perianal/perirectal abscesses, history of laparoscopic diverting sigmoid colostomy 2013, and arthritis who recently presented to [facility] with dyspnea, right-sided chest pain and lower extremity swelling.  Subsequently found to be anemic with hemoglobin of 4.2.  CT chest showed bilateral pulmonary emboli along with right lower lobe consolidation concerning for infection versus pulmonary infarct.  Fecal occult blood test was  positive .  No overt bleeding noted.  He has been transfused PRBC's.  He has been scheduled for EGD and colonoscopy tomorrow.  Lower extremity venous Doppler study yesterday revealed no evidence of DVT bilaterally.  Current labs include WBC 13.3, hemoglobin 7.7, down from 11.5 yesterday, platelets 470K, creatinine 1.13.  Request now received from critical care team for IVC filter placement.Case has been reviewed by Dr. Maryelizabeth Kaufmann. Risks and benefits discussed with the patient including, but not  limited to bleeding, infection, contrast induced renal failure, filter fracture or migration which can lead to emergency surgery or even death, strut penetration with damage or irritation to adjacent structures and caval thrombosis.  All of the patient's questions were answered, patient is agreeable to proceed. Consent signed and in chart.  Procedure scheduled for today.     Thank you for this interesting consult.  I greatly enjoyed meeting Lanny Hurst and look forward to participating in their care.  A copy of this report was sent to the requesting provider on this date.  Electronically Signed: D. Jeananne Rama, PA-C 02/27/2021, 9:12 AM   I spent a total of  25 minutes   in face to face in clinical consultation, greater than 50% of which was counseling/coordinating care for inferior venacavogram with IVC filter placement

## 2021-02-27 NOTE — Procedures (Signed)
Vascular and Interventional Radiology Procedure Note  Patient: Leon Nichols DOB: March 02, 1966 Medical Record Number: [id number] Note Date/Time: 02/27/21 2:59 PM   Performing Physician: Roanna Banning, MD Assistant(s): Lowella Grip, RT  Diagnosis: PE. GIB. Unable to anticoagulate.  Procedure:  INFERIOR VENA CAVA FILTER PLACEMENT  Anesthesia: Conscious Sedation Complications: None Estimated Blood Loss: Minimal Specimens: None  Findings:  - access via the Right femoral vein. - successful placement of infrarenal IVC filter.  Plan: IVC filters can cause complications when left in place for extended periods of time. If medically appropriate, recommend discontinuing filter prior to discharge.   Please re-evaluate the patient for filter discontinuation when they are seen in follow up, and refer patient to Interventional Radiology for removal.  Final report to follow once all images are reviewed and compared with previous studies.  See detailed dictation with images in PACS. The patient tolerated the procedure well without incident or complication and was returned to Floor Bed in stable condition.    Roanna Banning, MD [facility] Radiology   Pager. [phone] Clinic. [phone]

## 2021-02-28 ENCOUNTER — Inpatient Hospital Stay (HOSPITAL_COMMUNITY): Payer: Medicare Other

## 2021-02-28 ENCOUNTER — Inpatient Hospital Stay (HOSPITAL_COMMUNITY): Payer: Medicare Other | Admitting: Anesthesiology

## 2021-02-28 ENCOUNTER — Encounter (HOSPITAL_COMMUNITY)
Admission: EM | Disposition: A | Discharge status: Home Health | Payer: Self-pay | Source: Home / Self Care | Attending: Internal Medicine

## 2021-02-28 ENCOUNTER — Encounter (HOSPITAL_COMMUNITY): Payer: Self-pay | Admitting: Pulmonary Disease

## 2021-02-28 HISTORY — PX: ESOPHAGOGASTRODUODENOSCOPY (EGD) WITH PROPOFOL: SHX5813

## 2021-02-28 HISTORY — PX: COLONOSCOPY WITH PROPOFOL: SHX5780

## 2021-02-28 LAB — TSH: TSH: 1.401 u[IU]/mL (ref 0.350–4.500)

## 2021-02-28 LAB — LEGIONELLA PNEUMOPHILA SEROGP 1 UR AG: L. pneumophila Serogp 1 Ur Ag: NEGATIVE

## 2021-02-28 LAB — COMPREHENSIVE METABOLIC PANEL
ALT: 6 U/L (ref 0–44)
AST: 11 U/L — ABNORMAL LOW (ref 15–41)
Albumin: 2.3 g/dL — ABNORMAL LOW (ref 3.5–5.0)
Alkaline Phosphatase: 98 U/L (ref 38–126)
Anion gap: 7 (ref 5–15)
BUN: 13 mg/dL (ref 6–20)
CO2: 17 mmol/L — ABNORMAL LOW (ref 22–32)
Calcium: 8.6 mg/dL — ABNORMAL LOW (ref 8.9–10.3)
Chloride: 109 mmol/L (ref 98–111)
Creatinine, Ser: 0.98 mg/dL (ref 0.61–1.24)
GFR, Estimated: >60 mL/min (ref >60)
Glucose, Bld: 115 mg/dL — ABNORMAL HIGH (ref 70–99)
Potassium: 4.7 mmol/L (ref 3.5–5.1)
Sodium: 133 mmol/L — ABNORMAL LOW (ref 135–145)
Total Bilirubin: 1.2 mg/dL (ref 0.3–1.2)
Total Protein: 8 g/dL (ref 6.5–8.1)

## 2021-02-28 LAB — CBC
HCT: 26.3 % — ABNORMAL LOW (ref 39.0–52.0)
Hemoglobin: 8 g/dL — ABNORMAL LOW (ref 13.0–17.0)
MCH: 28.5 pg (ref 26.0–34.0)
MCHC: 30.4 g/dL (ref 30.0–36.0)
MCV: 93.6 fL (ref 80.0–100.0)
Platelets: 369 10*3/uL (ref 150–400)
RBC: 2.81 MIL/uL — ABNORMAL LOW (ref 4.22–5.81)
RDW: 18.2 % — ABNORMAL HIGH (ref 11.5–15.5)
WBC: 16.1 10*3/uL — ABNORMAL HIGH (ref 4.0–10.5)
nRBC: 0 % (ref 0.0–0.2)

## 2021-02-28 LAB — VITAMIN B12: Vitamin B-12: 480 pg/mL (ref 180–914)

## 2021-02-28 LAB — IRON AND TIBC
Iron: 16 ug/dL — ABNORMAL LOW (ref 45–182)
Saturation Ratios: 10 % — ABNORMAL LOW (ref 17.9–39.5)
TIBC: 159 ug/dL — ABNORMAL LOW (ref 250–450)
UIBC: 143 ug/dL

## 2021-02-28 LAB — FERRITIN: Ferritin: 1499 ng/mL — ABNORMAL HIGH (ref 24–336)

## 2021-02-28 LAB — HEPARIN LEVEL (UNFRACTIONATED): Heparin Unfractionated: <0.1 IU/mL — ABNORMAL LOW (ref 0.30–0.70)

## 2021-02-28 LAB — HEMOGLOBIN: Hemoglobin: 7.8 g/dL — ABNORMAL LOW (ref 13.0–17.0)

## 2021-02-28 SURGERY — ESOPHAGOGASTRODUODENOSCOPY (EGD) WITH PROPOFOL
Anesthesia: Monitor Anesthesia Care

## 2021-02-28 MED ORDER — PROPOFOL 500 MG/50ML IV EMUL
INTRAVENOUS | Status: DC | PRN
  Administered 2021-02-28: 125 ug/kg/min via INTRAVENOUS

## 2021-02-28 MED ORDER — HEPARIN (PORCINE) 25000 UT/250ML-% IV SOLN
2050.0000 [IU]/h | INTRAVENOUS | Status: DC
  Administered 2021-02-28: 1600 [IU]/h via INTRAVENOUS
  Administered 2021-03-01: 05:00:00 1800 [IU]/h via INTRAVENOUS
  Filled 2021-02-28 (×2): qty 250

## 2021-02-28 MED ORDER — ONDANSETRON HCL 4 MG/2ML IJ SOLN
INTRAMUSCULAR | Status: DC | PRN
  Administered 2021-02-28: 4 mg via INTRAVENOUS

## 2021-02-28 MED ORDER — DEXMEDETOMIDINE (PRECEDEX) IN NS 20 MCG/5ML (4 MCG/ML) IV SYRINGE
PREFILLED_SYRINGE | INTRAVENOUS | Status: AC
  Filled 2021-02-28: qty 5

## 2021-02-28 MED ORDER — PROPOFOL 10 MG/ML IV BOLUS
INTRAVENOUS | Status: DC | PRN
  Administered 2021-02-28: 40 mg via INTRAVENOUS

## 2021-02-28 MED ORDER — LACTATED RINGERS IV SOLN
INTRAVENOUS | Status: AC | PRN
  Administered 2021-02-28: 1000 mL via INTRAVENOUS

## 2021-02-28 MED ORDER — SODIUM CHLORIDE 0.9 % IV SOLN
INTRAVENOUS | Status: DC | PRN
  Administered 2021-03-03: 200 mL via INTRAVENOUS

## 2021-02-28 MED ORDER — LIDOCAINE 2% (20 MG/ML) 5 ML SYRINGE
INTRAMUSCULAR | Status: DC | PRN
  Administered 2021-02-28: 100 mg via INTRAVENOUS

## 2021-02-28 MED ORDER — IOHEXOL 350 MG/ML SOLN
100.0000 mL | Freq: Once | INTRAVENOUS | Status: AC | PRN
  Administered 2021-02-28: 100 mL via INTRAVENOUS

## 2021-02-28 MED ORDER — PROPOFOL 1000 MG/100ML IV EMUL
INTRAVENOUS | Status: AC
  Filled 2021-02-28: qty 100

## 2021-02-28 MED ORDER — DEXMEDETOMIDINE (PRECEDEX) IN NS 20 MCG/5ML (4 MCG/ML) IV SYRINGE
PREFILLED_SYRINGE | INTRAVENOUS | Status: DC | PRN
  Administered 2021-02-28: 8 ug via INTRAVENOUS

## 2021-02-28 SURGICAL SUPPLY — 25 items

## 2021-02-28 NOTE — Progress Notes (Signed)
ANTICOAGULATION CONSULT NOTE - Follow Up Consult  Pharmacy Consult for IV heparin Indication: pulmonary embolus  No Known Allergies  Patient Measurements: Height: 6\' 1"  (185.4 cm) Weight: 104.3 kg (230 lb) IBW/kg (Calculated) : 79.9 Heparin Dosing Weight: 104.3 kg   Vital Signs: Temp: 99.6 F (37.6 C) (11/26 2000) Temp Source: Oral (11/26 2000) BP: 140/92 (11/26 2100) Pulse Rate: 101 (11/26 2100)  Labs: Recent Labs    02/27/21 0248 02/27/21 0426 02/27/21 0646 02/27/21 1545 02/28/21 0259 02/28/21 1506 02/28/21 2127  HGB  --    < > 7.7* 7.4* 8.0* 7.8*  --   HCT  --    < > 24.8* 23.9* 26.3*  --   --   PLT  --    < > 470* 394 369  --   --   HEPARINUNFRC  --   --   --   --   --   --  <0.10*  CREATININE 1.24  --  1.13  --  0.98  --   --    < > = values in this interval not displayed.     Estimated Creatinine Clearance: 110.6 mL/min (by C-G formula based on SCr of 0.98 mg/dL).   Medications:  Scheduled:   abacavir-dolutegravir-lamiVUDine  1 tablet Oral Daily   Chlorhexidine Gluconate Cloth  6 each Topical Daily   guaiFENesin  5 mL Oral TID   pantoprazole (PROTONIX) IV  40 mg Intravenous Q12H    Assessment: Pharmacy is consulted to dose heparin in 55 yo male diagnosed with PE with right heart strain. Pt not initially started on anticoagulation as Hgb was 4.4. Pt has received several transfusions during admission. IV filter placed on 11/25   Per discussion with MD, would like to avoid boluses and aim for lower heparin target range on 0.3 to 0.5 due to recent   bleeds   Today, 02/28/21  Hgb up to 8.0  Plt 369 Scr 0.98 mg/dl, CrCl > 03/02/21 ml/min  Heparin level < 0.1 with heparin gtt @ 1600 units/hr No interruptions in therapy and no line issues reported by RN.  No bleeding issues reported by RN.   Goal of Therapy:  Heparin level 0.3-0.5 units/ml Monitor platelets by anticoagulation protocol: Yes   Plan:  No bolus as per consult  Increase Heparin to 1800  units/hr  Obtain HL 6 hours after infusion rate increase  Daily CBC while  on heparin Monitor for signs and symptoms of bleeding  993, PharmD 02/28/2021 10:15 PM

## 2021-02-28 NOTE — Anesthesia Postprocedure Evaluation (Signed)
Anesthesia Post Note  Patient: Leon Nichols  Procedure(s) Performed: ESOPHAGOGASTRODUODENOSCOPY (EGD) WITH PROPOFOL COLONOSCOPY WITH PROPOFOL     Patient location during evaluation: PACU Anesthesia Type: MAC Level of consciousness: awake and alert Pain management: pain level controlled Vital Signs Assessment: post-procedure vital signs reviewed and stable Respiratory status: spontaneous breathing, nonlabored ventilation and respiratory function stable Cardiovascular status: stable, blood pressure returned to baseline and tachycardic Anesthetic complications: no   No notable events documented.  Last Vitals:  Vitals:   02/28/21 0950 02/28/21 1000  BP: 119/64 125/76  Pulse: (!) 118 (!) 115  Resp: (!) 44 (!) 39  Temp: 37.3 C   SpO2: 96% 92%    Last Pain:  Vitals:   02/28/21 0950  TempSrc: Axillary  PainSc:                  Audry Pili

## 2021-02-28 NOTE — Progress Notes (Signed)
I was notified that he was unable to tolerated a CT hematuria scan as he could not lay flat. He just received CT chest on 11/24. I ordered renal bladder ultrasound but he will ultimately require cross sectional imaging when he can lay flat.  Matt R. Elson Ulbrich MD [facility]  Pager: [phone]

## 2021-02-28 NOTE — Transfer of Care (Signed)
Immediate Anesthesia Transfer of Care Note  Patient: Leon Nichols  Procedure(s) Performed: ESOPHAGOGASTRODUODENOSCOPY (EGD) WITH PROPOFOL COLONOSCOPY WITH PROPOFOL  Patient Location: PACU  Anesthesia Type:MAC  Level of Consciousness: sedated  Airway & Oxygen Therapy: Patient Spontanous Breathing and Patient connected to face mask oxygen  Post-op Assessment: Report given to RN and Post -op Vital signs reviewed and stable  Post vital signs: Reviewed and stable  Last Vitals:  Vitals Value Taken Time  BP 119/64 02/28/21 0948  Temp    Pulse 117 02/28/21 0950  Resp 48 02/28/21 0950  SpO2 97 % 02/28/21 0950  Vitals shown include unvalidated device data.  Last Pain:  Vitals:   02/28/21 0848  TempSrc: Oral  PainSc: 0-No pain         Complications: No notable events documented.

## 2021-02-28 NOTE — Interval H&P Note (Signed)
History and Physical Interval Note:  02/28/2021 9:00 AM  Lanny Hurst  has presented today for surgery, with the diagnosis of Severe anemia, heme positive stool.  The various methods of treatment have been discussed with the patient and family. After consideration of risks, benefits and other options for treatment, the patient has consented to  Procedure(s): ESOPHAGOGASTRODUODENOSCOPY (EGD) WITH PROPOFOL (N/A) COLONOSCOPY WITH PROPOFOL (N/A) as a surgical intervention.  The patient's history has been reviewed, patient examined, no change in status, stable for surgery.  I have reviewed the patient's chart and labs.  Questions were answered to the patient's satisfaction.     Leon Nichols

## 2021-02-28 NOTE — Op Note (Signed)
[facility] Patient Name: Leon Nichols Procedure Date: 02/28/2021 MRN: [id number] Attending MD: Willis Modena , MD Date of Birth: 25-Nov-1965 CSN: [id number] Age: 55 Admit Type: Outpatient Procedure:                Colonoscopy Indications:              Iron deficiency anemia secondary to chronic blood                            loss Providers:                Willis Modena, MD, Glory Rosebush, RN, Rozetta Nunnery, Technician Referring MD:              Medicines:                Monitored Anesthesia Care Complications:            Desaturation during procedure, resolved with                            supplemental oxygen, and normal oxygenation by end                            of procedure. Estimated Blood Loss:     Estimated blood loss: none. Procedure:                Pre-Anesthesia Assessment:                           - Prior to the procedure, a History and Physical                            was performed, and patient medications and                            allergies were reviewed. The patient's tolerance of                            previous anesthesia was also reviewed. The risks                            and benefits of the procedure and the sedation                            options and risks were discussed with the patient.                            All questions were answered, and informed consent                            was obtained. Prior Anticoagulants: The patient has                            taken no previous anticoagulant or antiplatelet  agents. ASA Grade Assessment: IV - A patient with                            severe systemic disease that is a constant threat                            to life. After reviewing the risks and benefits,                            the patient was deemed in satisfactory condition to                            undergo the procedure.                            After obtaining informed consent, the colonoscope                            was passed under direct vision. Throughout the                            procedure, the patient's blood pressure, pulse, and                            oxygen saturations were monitored continuously. The                            [id number]-HQ190L ([id number] Olympus colonoscope was                            introduced through the sigmoid colostomy with the                            intention of advancing to the cecum. The scope was                            advanced to the transverse colon before the                            procedure was aborted. Medications were given. The                            colonoscopy was performed without difficulty. The                            patient tolerated the procedure well. The quality                            of the bowel preparation was poor. Scope In: 9:23:52 AM Scope Out: 9:40:42 AM Total Procedure Duration: 0 hours 16 minutes 49 seconds  Findings:      Hemorrhoids were found on perianal exam.      Patient has what was ultimately learned was a loop colostomy. Nodularity  and fibrotic circular tissue around the ostomy orifice per se. Prograde       (through anus) and retrograde views of distal rectum a bit friable       (especially at anal canal) and mucosa was a bit friable. Mucous balls       obscuring many views of distal rectum. Second loop entered, going       retrograde (towards cecum), complicated by poor prep. Solid stool       obscured most mucosal views of colon. Inadequate for screening purposes.       Doesn't readily appear that patient consumed much, if any, of his bowel       prep. Impression:               - Loop-type LLQ colostomy, with entryways both to                            distal rectum and proximal colon.Preparation of the                            colon was poor.                           - Hemorrhoids found on perianal exam.                            - Inadequate exam due to poor bowel prep. Moderate Sedation:      Not Applicable - Patient had care per Anesthesia. Recommendation:           - Return patient to hospital ward for ongoing care.                           - Full liquid diet today.                           - Continue present medications.                           [facility] will revisit Monday. Given his                            desaturation during today's procedure, I don't                            think it's safe to reattempt colonoscopy for at                            least another couple days. Procedure Code(s):        --- Professional ---                           [phone], 53, Colonoscopy through stoma; diagnostic,                            including collection of specimen(s) by brushing or  washing, when performed (separate procedure) Diagnosis Code(s):        --- Professional ---                           K64.9, Unspecified hemorrhoids                           D50.0, Iron deficiency anemia secondary to blood                            loss (chronic) CPT copyright 2019 [employer]. All rights reserved. The codes documented in this report are preliminary and upon coder review may  be revised to meet current compliance requirements. Willis Modena, MD 02/28/2021 10:15:57 AM This report has been signed electronically. Number of Addenda: 0

## 2021-02-28 NOTE — Consult Note (Signed)
Urology Consult   Physician requesting consult: Shonna Chock, MD  Reason for consult: History of gross hematuria  History of Present Illness: Leon Nichols is a 55 y.o. with a history of HIV, HTN, EtOH use and colostomy presented to the ED with lower extremity edema, dyspnea and pleuritic chest pain. He was found to have bilateral pulmonary embolism with reduced RV function. His hemoglobin was also found to be 4 from normal earlier this year. He was transfused 4 units and hemoglobin is currently appropriate at 4. He did not have melena or hematochezia however had fecal occult blood and underwent GI evaluation on 02/28/2021 and found to have an unremarkable upper endoscopy and colonoscopy despite poor bowel prep.  Pt does endorse a vague history of 2 month of intermittent gross hematuria. He denied passing any clots. He states that the urine was intermittently red. He states he has not seen any gross blood over the past 2 weeks. His urine is clear yellow and he has been voiding without difficulty without frequency, urgency or dysuria. Urinalysis at admission did demonstrated moderate blood however there was 0-5RBC/HPF. He denies personal history of malignancy and denies family history of urologic malignancy. He does have a smoking history and states smokes a few cigarretes a day at the max for many years and last smoked 3 months ago. He denies smoking marijuana. He denies abdominal pain or flank pain.   He did have a history of hidradenitits in his scrotum and groin earlier this year that has resolved.  Past Medical History:  Diagnosis Date   Acute back pain 10/28/2016   Colitis    Diverticulosis    Hemorrhoids    History of shingles    HIV (human immunodeficiency virus infection) (HCC) 03/24/2012   Hypertension    Perianal abscess 02/06/2015   Perirectal abscess    Septic arthritis (HCC) 02/05/2015    Past Surgical History:  Procedure Laterality Date   arthroscopic wash out of right knee      COLONOSCOPY  08/31/2011   Procedure: COLONOSCOPY;  Surgeon: Hilarie Fredrickson, MD;  Location: [facility] ENDOSCOPY;  Service: Endoscopy;  Laterality: N/A;   DRESSING CHANGE UNDER ANESTHESIA  03/27/2012   Procedure: DRESSING CHANGE UNDER ANESTHESIA;  Surgeon: Currie Paris, MD;  Location: [facility] OR;  Service: General;  Laterality: N/A;   INCISION AND DRAINAGE PERIRECTAL ABSCESS  03/23/2012   Procedure: IRRIGATION AND DEBRIDEMENT PERIRECTAL ABSCESS;  Surgeon: Mariella Saa, MD;  Location: [facility] OR;  Service: General;  Laterality: N/A;  I & D soft tissue scrotal and perineal abscess   IR IVC FILTER PLMT / S&I Lenise Arena GUID/MOD SED  02/27/2021   KNEE ARTHROSCOPY Right 02/05/2015   Procedure: ARTHROSCOPIC [facility] OUT RIGHT KNEE;  Surgeon: Sheral Apley, MD;  Location: [facility] OR;  Service: Orthopedics;  Laterality: Right;   LAPAROSCOPIC DIVERTED COLOSTOMY  03/27/2012   Procedure: LAPAROSCOPIC DIVERTED COLOSTOMY;  Surgeon: Currie Paris, MD;  Location: [facility] OR;  Service: General;  Laterality: N/A;    Medications:  Home meds:  No current facility-administered medications on file prior to encounter.   Current Outpatient Medications on File Prior to Encounter  Medication Sig Dispense Refill   abacavir-dolutegravir-lamiVUDine (TRIUMEQ) 600-50-300 MG tablet Take 1 tablet by mouth daily. 30 tablet 5   amLODipine (NORVASC) 10 MG tablet TAKE 1 TABLET(10 MG) BY MOUTH DAILY (Patient taking differently: Take 10 mg by mouth daily.) 30 tablet 0   lisinopril (ZESTRIL) 20 MG tablet TAKE 1 TABLET(20 MG) BY MOUTH  DAILY (Patient taking differently: Take 20 mg by mouth daily.) 30 tablet 0     Scheduled Meds:  abacavir-dolutegravir-lamiVUDine  1 tablet Oral Daily   Chlorhexidine Gluconate Cloth  6 each Topical Daily   guaiFENesin  5 mL Oral TID   pantoprazole (PROTONIX) IV  40 mg Intravenous Q12H   Continuous Infusions:  cefTRIAXone (ROCEPHIN)  IV Stopped (02/28/21 0721)   heparin     PRN Meds:.acetaminophen, docusate  sodium, ondansetron (ZOFRAN) IV, polyethylene glycol  Allergies: No Known Allergies  Family History  Problem Relation Age of Onset   Hypertension Mother    Diabetes Mother     Social History:  reports that he has quit smoking. His smoking use included cigarettes. He has a 3.75 pack-year smoking history. He has never used smokeless tobacco. He reports current alcohol use of about 50.0 standard drinks per week. He reports that he does not currently use drugs after having used the following drugs: Marijuana and Cocaine.  ROS: A complete review of systems was performed.  All systems are negative except for pertinent findings as noted.  Physical Exam:  Vital signs in last 24 hours: Temp:  [98.2 F (36.8 C)-100.5 F (38.1 C)] 98.2 F (36.8 C) (11/26 1255) Pulse Rate:  [95-118] 109 (11/26 1020) Resp:  [20-44] 40 (11/26 1020) BP: (119-167)/(64-100) 138/88 (11/26 1020) SpO2:  [92 %-99 %] 95 % (11/26 1020) Weight:  [104.3 kg] 104.3 kg (11/26 0848) Constitutional:  Alert and oriented, No acute distress Cardiovascular: Regular rate and rhythm Respiratory: Normal respiratory effort, Lungs clear bilaterally GI: Abdomen is soft, nontender, nondistended, no abdominal masses Genitourinary: No CVAT. Normal male phallus, testes are descended bilaterally and non-tender and without masses, scrotum is normal in appearance without lesions or masses, perineum is normal on inspection. Rectal: Normal sphincter tone, no rectal masses, prostate is non tender and without nodularity. Prostate size is estimated to be 40 cc Neurologic: Grossly intact, no focal deficits Psychiatric: Normal mood and affect  Laboratory Data:  Recent Labs    02/26/21 0524 02/26/21 1042 02/26/21 2211 02/27/21 0426 02/27/21 0646 02/27/21 1545 02/28/21 0259  WBC 13.1*  --   --  9.9 13.3* 13.9* 16.1*  HGB 5.1*   < > 7.5* 11.5* 7.7* 7.4* 8.0*  HCT 17.0*   < > 24.1* 40.1 24.8* 23.9* 26.3*  PLT 445*  --   --  296 470* 394 369    < > = values in this interval not displayed.    Recent Labs    02/25/21 2349 02/26/21 0524 02/27/21 0248 02/27/21 0646 02/28/21 0259  NA 132* 133* 134* 134* 133*  K 4.9 5.2* 5.1 4.9 4.7  CL 108 106 111 110 109  GLUCOSE 124* 110* 105* 104* 115*  BUN 43* 42* 24* 21* 13  CALCIUM 8.2* 8.1* 8.3* 8.5* 8.6*  CREATININE 2.18* 1.89* 1.24 1.13 0.98     Results for orders placed or performed during the hospital encounter of 02/25/21 (from the past 24 hour(s))  CBC     Status: Abnormal   Collection Time: 02/27/21  3:45 PM  Result Value Ref Range   WBC 13.9 (H) 4.0 - 10.5 K/uL   RBC 2.59 (L) 4.22 - 5.81 MIL/uL   Hemoglobin 7.4 (L) 13.0 - 17.0 g/dL   HCT 23.9 (L) 39.0 - 52.0 %   MCV 92.3 80.0 - 100.0 fL   MCH 28.6 26.0 - 34.0 pg   MCHC 31.0 30.0 - 36.0 g/dL   RDW 18.1 (H) 11.5 - 15.5 %  Platelets 394 150 - 400 K/uL   nRBC 0.0 0.0 - 0.2 %  CBC     Status: Abnormal   Collection Time: 02/28/21  2:59 AM  Result Value Ref Range   WBC 16.1 (H) 4.0 - 10.5 K/uL   RBC 2.81 (L) 4.22 - 5.81 MIL/uL   Hemoglobin 8.0 (L) 13.0 - 17.0 g/dL   HCT 26.3 (L) 39.0 - 52.0 %   MCV 93.6 80.0 - 100.0 fL   MCH 28.5 26.0 - 34.0 pg   MCHC 30.4 30.0 - 36.0 g/dL   RDW 18.2 (H) 11.5 - 15.5 %   Platelets 369 150 - 400 K/uL   nRBC 0.0 0.0 - 0.2 %  Comprehensive metabolic panel     Status: Abnormal   Collection Time: 02/28/21  2:59 AM  Result Value Ref Range   Sodium 133 (L) 135 - 145 mmol/L   Potassium 4.7 3.5 - 5.1 mmol/L   Chloride 109 98 - 111 mmol/L   CO2 17 (L) 22 - 32 mmol/L   Glucose, Bld 115 (H) 70 - 99 mg/dL   BUN 13 6 - 20 mg/dL   Creatinine, Ser 0.98 0.61 - 1.24 mg/dL   Calcium 8.6 (L) 8.9 - 10.3 mg/dL   Total Protein 8.0 6.5 - 8.1 g/dL   Albumin 2.3 (L) 3.5 - 5.0 g/dL   AST 11 (L) 15 - 41 U/L   ALT 6 0 - 44 U/L   Alkaline Phosphatase 98 38 - 126 U/L   Total Bilirubin 1.2 0.3 - 1.2 mg/dL   GFR, Estimated >60 >60 mL/min   Anion gap 7 5 - 15   Recent Results (from the past 240  hour(s))  Resp Panel by RT-PCR (Flu A&B, Covid) Nasopharyngeal Swab     Status: None   Collection Time: 02/26/21 12:32 AM   Specimen: Nasopharyngeal Swab; Nasopharyngeal(NP) swabs in vial transport medium  Result Value Ref Range Status   SARS Coronavirus 2 by RT PCR NEGATIVE NEGATIVE Final    Comment: (NOTE) SARS-CoV-2 target nucleic acids are NOT DETECTED.  The SARS-CoV-2 RNA is generally detectable in upper respiratory specimens during the acute phase of infection. The lowest concentration of SARS-CoV-2 viral copies this assay can detect is 138 copies/mL. A negative result does not preclude SARS-Cov-2 infection and should not be used as the sole basis for treatment or other patient management decisions. A negative result may occur with  improper specimen collection/handling, submission of specimen other than nasopharyngeal swab, presence of viral mutation(s) within the areas targeted by this assay, and inadequate number of viral copies(<138 copies/mL). A negative result must be combined with clinical observations, patient history, and epidemiological information. The expected result is Negative.  Fact Sheet for Patients:  EntrepreneurPulse.com.au  Fact Sheet for Healthcare Providers:  IncredibleEmployment.be  This test is no t yet approved or cleared by the [place] FDA and  has been authorized for detection and/or diagnosis of SARS-CoV-2 by FDA under an Emergency Use Authorization (EUA). This EUA will remain  in effect (meaning this test can be used) for the duration of the COVID-19 declaration under Section 564(b)(1) of the Act, 21 U.S.C.section 360bbb-3(b)(1), unless the authorization is terminated  or revoked sooner.       Influenza A by PCR NEGATIVE NEGATIVE Final   Influenza B by PCR NEGATIVE NEGATIVE Final    Comment: (NOTE) The Xpert Xpress SARS-CoV-2/FLU/RSV plus assay is intended as an aid in the diagnosis of influenza from  Nasopharyngeal swab specimens and should  not be used as a sole basis for treatment. Nasal washings and aspirates are unacceptable for Xpert Xpress SARS-CoV-2/FLU/RSV testing.  Fact Sheet for Patients: EntrepreneurPulse.com.au  Fact Sheet for Healthcare Providers: IncredibleEmployment.be  This test is not yet approved or cleared by the [place] FDA and has been authorized for detection and/or diagnosis of SARS-CoV-2 by FDA under an Emergency Use Authorization (EUA). This EUA will remain in effect (meaning this test can be used) for the duration of the COVID-19 declaration under Section 564(b)(1) of the Act, 21 U.S.C. section 360bbb-3(b)(1), unless the authorization is terminated or revoked.  Performed at [facility], [place] [address], [place], [place] [address]   Urine Culture     Status: None   Collection Time: 02/26/21  2:31 AM   Specimen: Urine, Clean Catch  Result Value Ref Range Status   Specimen Description   Final    URINE, CLEAN CATCH Performed at [facility], [place] [address], [place], [place] [address]    Special Requests   Final    Immunocompromised Performed at [facility], [place] [address], [place], [place] [address]    Culture   Final    NO GROWTH Performed at [facility], [place] [address], [place], [place] [address]    Report Status 02/27/2021 FINAL  Final  Culture, blood (routine x 2)     Status: None (Preliminary result)   Collection Time: 02/26/21  5:24 AM   Specimen: BLOOD  Result Value Ref Range Status   Specimen Description   Final    BLOOD LEFT ANTECUBITAL Performed at [place] [address] [place], [place] [address]    Special Requests   Final    BOTTLES DRAWN AEROBIC AND ANAEROBIC Blood Culture results may not be optimal due to an inadequate volume of blood received in culture bottles Performed at [place] [address] [place], [place] [address]    Culture   Final    NO GROWTH 2 DAYS Performed at [place] [address] [place], [place] [address]    Report Status PENDING  Incomplete  MRSA Next Gen by PCR, Nasal     Status: None   Collection Time: 02/26/21  5:24 AM   Specimen: Nasal Mucosa; Nasal Swab  Result Value Ref Range Status   MRSA by PCR Next Gen NOT DETECTED NOT DETECTED Final    Comment: (NOTE) The GeneXpert MRSA Assay (FDA approved for NASAL specimens only), is one component of a comprehensive MRSA colonization surveillance program. It is not intended to diagnose MRSA infection nor to guide or monitor treatment for MRSA infections. Test performance is not FDA approved in patients less than 38 years old. Performed at [facility], [place] [address] [place], [place] [address]   MRSA Next Gen by PCR, Nasal     Status: None   Collection Time: 02/26/21  9:11 AM   Specimen: Nasal Mucosa; Nasal Swab  Result Value Ref Range Status   MRSA by PCR Next Gen NOT DETECTED NOT DETECTED Final    Comment: (NOTE) The GeneXpert MRSA Assay (FDA approved for NASAL specimens only), is one component of a comprehensive MRSA colonization surveillance program. It is not intended to diagnose MRSA infection nor to guide or monitor treatment for MRSA infections. Test performance is not FDA approved in patients less than 40 years old. Performed at [facility], [place] [address], [place], [place] [address]  Culture, blood (routine x 2)     Status: None (Preliminary result)   Collection Time: 02/26/21 10:42 AM   Specimen: BLOOD LEFT FOREARM  Result Value Ref Range Status   Specimen Description   Final    BLOOD LEFT FOREARM Performed at [facility], [address] [place], [place] [address]    Special Requests   Final    BOTTLES DRAWN AEROBIC ONLY Blood Culture adequate volume Performed at [facility], [address] [place], [place] [address]    Culture   Final    NO GROWTH 2 DAYS Performed at [facility], [address] [place], [place] [address]    Report Status PENDING  Incomplete    Renal Function: Recent Labs    02/25/21 2349 02/26/21 0524 02/27/21 0248 02/27/21 0646 02/28/21 0259  CREATININE 2.18* 1.89* 1.24 1.13 0.98   Estimated Creatinine Clearance: 110.6 mL/min (by C-G formula based on SCr of 0.98 mg/dL).  Radiologic Imaging: IR IVC FILTER PLMT / S&I Lenise Arena/IMG GUID/MOD SED  Result Date: 02/27/2021 INDICATION: Acute PE.  GI bleed, contraindicating anticoagulation EXAM: ULTRASOUND GUIDANCE FOR VASCULAR ACCESS IVC CATHETERIZATION AND VENOGRAM INFERIOR VENA CAVA FILTER PLACEMENT MEDICATIONS: None. ANESTHESIA/SEDATION: Fentanyl 2 mcg IV; Versed 100 mg IV Sedation Time: 23 minutes; The patient was continuously monitored during the procedure by the interventional radiology nurse under my direct supervision. CONTRAST:  45 mL Omnipaque 300 FLUOROSCOPY TIME:  1 minutes 42 seconds (57 mGy) COMPLICATIONS: None immediate. PROCEDURE: Informed written consent was obtained from the patient following explanation of the procedure, risks, benefits and alternatives. A time out was performed prior to the initiation of the procedure. Maximal barrier sterile technique utilized including caps, mask, sterile gowns, sterile gloves, large sterile drape, hand hygiene, and Betadine prep. Under sterile condition and local anesthesia, right common femoral venous access was performed with ultrasound. An ultrasound image was saved and sent to PACS. Over a guidewire, the IVC filter delivery sheath and inner dilator were advanced into the IVC just above the IVC bifurcation. Contrast injection was performed for an IVC venogram. Through the delivery sheath, a retrievable Denali IVC filter was deployed below the level of the renal veins and above the IVC bifurcation. Limited post deployment  venacavagram was performed. The delivery sheath was removed and hemostasis was obtained with manual compression. A dressing was placed. The patient tolerated the procedure well without immediate post procedural complication. FINDINGS: The IVC is patent. No evidence of thrombus, stenosis, or occlusion. No variant venous anatomy. Successful placement of the IVC filter below the level of the renal veins. IMPRESSION: Successful placement of a retrievable infrarenal IVC filter, as above. PLAN: IVC filters can cause complications when left in place for extended periods of time. If medically appropriate, recommend discontinuing filter prior to discharge. Please re-evaluate the patient for filter discontinuation when they are seen in follow up, and refer patient to Interventional Radiology for removal. Roanna BanningJon Mugweru, MD [facility] Electronically Signed   By: Roanna BanningJon  Mugweru M.D.   On: 02/27/2021 18:57    I independently reviewed the above imaging studies.  Impression/Recommendation Anemia: Hgb 4 on admission. S/p 4u pRBC with hgb at 8 at consultation. Unremarkable EGD and colonoscopy 02/28/21 History of gross hematuria: Per patient, intermittent over the past 2 months, not associated with clots. Resolved for previous 2 weeks. Urine clear yellow. Bilateral pulmonary embolism Pulmonary infiltrate, treating for possible CAP HIV  -Given anemia and history of gross  hematuria, I recommend hematuria evaluation with CT urogram. He will eventually require cystoscopy. -No need for any urgent procedure as he has had clear yellow urine for the past 2 weeks. -Will obtain urine cytology -Following  Matt R. Osei Anger MD 02/28/2021, 2:42 PM  [facility]  Pager: [phone]   [id number] Si Raider, MD

## 2021-02-28 NOTE — Progress Notes (Addendum)
PROGRESS NOTE    Leon Nichols  [id number] DOB: Dec 06, 1965 DOA: 02/25/2021 PCP: Pcp, No  Outpatient Specialists: infectious disease    Brief Narrative:   Hx HIV, HTN, EtOH, colostomy, presenting on 11/23 with lower extremity swelling and dyspnea and pleuritic right chest pain. Here found to have b/l PEs with reduced RV function on TTE, also hemoglobin in 4s from normal in February of this year. No report of melena or hematochezia but does report one month of intermittent gross blood in urine for about a month.    Assessment & Plan:   Principal Problem:   Acute pulmonary embolism (HCC) Active Problems:   Anemia   HIV disease ([place])   Acute kidney injury ([place])   Acute DVT (deep venous thrombosis) (HCC)   Occult blood in stools   Pyuria, sterile   Acute pulmonary embolism without acute cor pulmonale (HCC)  # Anemia, normocytic # Fecal occult blood Hgb 4s on arrival, normal 05/2020. No melena or hematochezia but stool guaiac positive. Moderate blood on admission urinalysis and patient does endorse a month of intermittent hematuria. Unremarkable upper endoscopy 11/26. Due to poor prep of colon colonoscopy on 11/26 was unrevealing. Has been transfused 4 units and hgb stable today at 8.  - GI following, tentative plan to repeat colonoscopy on Monday - anticoagulation on hold for now - GI advises full liquid diet for now - will further w/u anemia with iron panel, b12, tsh, smear. Ldh low and haptoglobin elevated on 11/24 so low suspicion of hemolysis  # Hematuria One month intermittent gross hematuria. No hx urinary malignancy. Moderate hgb on admission ua though no RBCs visualized. Urine today no gross hematuria. Urine culture NGTD - urology consulted, advising CT urogram to start  # Bilateral Pulmonary Emboli # Acute hypoxic respiratory failure Symptomatic, with RV strain on TTE. No DVT seen on PVL but IVC placed 11/25. - discussed case w/ vascular, they advise starting  anticoagulation, will order (discussed w/ pharm not bolusing and aiming for low-end of therapeutic window). If new or worsening bleeding would need to d/c - will discuss w/ IR today Pascal Lux) as wondering whether thrombectomy or lysis may prove helpful, ir says location of PE is not suitable for thrombectomy/lysis - wean o2 as able, now stable on 4 L  # HIV Followed by ID, compliant w/ antiretrovirals - cont home meds  # Pulmonary infiltrate Treating for possible CAP.  - cont ceftriaxone (started 11/24), plan for 5 days. Stop azithromycin has received 3 doses   DVT prophylaxis: SCDs Code Status: full Family Communication: none @ bedside  Level of care: Stepdown Status is: Inpatient  Remains inpatient appropriate because: severity of illness        Consultants:  Pulm, GI, urology  Procedures: Colonoscopy/endoscopy 11/26, IR IVC placement 11/25  Antimicrobials:  Ceftriaxone S/p azithromycin    Subjective: Still with pleuritic right chest pain. Breathing stable. No abd pain  Objective: Vitals:   02/28/21 0950 02/28/21 1000 02/28/21 1010 02/28/21 1020  BP: 119/64 125/76 124/81 138/88  Pulse: (!) 118 (!) 115 (!) 110 (!) 109  Resp: (!) 44 (!) 39 (!) 24 (!) 40  Temp: 99.2 F (37.3 C)     TempSrc: Axillary     SpO2: 96% 92% 94% 95%  Weight:      Height:        Intake/Output Summary (Last 24 hours) at 02/28/2021 1114 Last data filed at 02/28/2021 0946 Gross per 24 hour  Intake 1612.08 ml  Output 2275  ml  Net -662.92 ml   Filed Weights   02/25/21 2322 02/27/21 0500 02/28/21 0848  Weight: 104.3 kg 104.3 kg 104.3 kg    Examination:  General exam: Appears calm and comfortable  Respiratory system: . Tachypnic. Rales at bases Cardiovascular system: S1 & S2 heard, RRR. No JVD, murmurs, rubs, gallops or clicks.  Gastrointestinal system: Abdomen is nondistended, soft and nontender. No organomegaly or masses felt. Normal bowel sounds heard. Colostomy draining grown  stool Central nervous system: Alert and oriented. No focal neurological deficits. Extremities: Symmetric 5 x 5 power. Trace LE edema Skin: No rashes, lesions or ulcers Psychiatry: Judgement and insight appear normal. Mood & affect appropriate.     Data Reviewed: I have personally reviewed following labs and imaging studies  CBC: Recent Labs  Lab 02/25/21 2349 02/26/21 0524 02/26/21 1042 02/26/21 2211 02/27/21 0426 02/27/21 0646 02/27/21 1545 02/28/21 0259  WBC 11.2* 13.1*  --   --  9.9 13.3* 13.9* 16.1*  NEUTROABS 7.5  --   --   --  7.0 10.3*  --   --   HGB 4.2* 5.1*   < > 7.5* 11.5* 7.7* 7.4* 8.0*  HCT 14.5* 17.0*   < > 24.1* 40.1 24.8* 23.9* 26.3*  MCV 91.8 90.4  --   --  98.3 91.9 92.3 93.6  PLT 413* 445*  --   --  296 470* 394 369   < > = values in this interval not displayed.   Basic Metabolic Panel: Recent Labs  Lab 02/25/21 2349 02/26/21 0524 02/27/21 0248 02/27/21 0646 02/28/21 0259  NA 132* 133* 134* 134* 133*  K 4.9 5.2* 5.1 4.9 4.7  CL 108 106 111 110 109  CO2 16* 17* 17* 16* 17*  GLUCOSE 124* 110* 105* 104* 115*  BUN 43* 42* 24* 21* 13  CREATININE 2.18* 1.89* 1.24 1.13 0.98  CALCIUM 8.2* 8.1* 8.3* 8.5* 8.6*  MG  --  2.5*  --   --   --    GFR: Estimated Creatinine Clearance: 110.6 mL/min (by C-G formula based on SCr of 0.98 mg/dL). Liver Function Tests: Recent Labs  Lab 02/25/21 2349 02/28/21 0259  AST 12* 11*  ALT 7 6  ALKPHOS 84 98  BILITOT 0.5 1.2  PROT 8.1 8.0  ALBUMIN 2.4* 2.3*   No results for input(s): LIPASE, AMYLASE in the last 168 hours. No results for input(s): AMMONIA in the last 168 hours. Coagulation Profile: No results for input(s): INR, PROTIME in the last 168 hours. Cardiac Enzymes: No results for input(s): CKTOTAL, CKMB, CKMBINDEX, TROPONINI in the last 168 hours. BNP (last 3 results) No results for input(s): PROBNP in the last 8760 hours. HbA1C: No results for input(s): HGBA1C in the last 72 hours. CBG: No results  for input(s): GLUCAP in the last 168 hours. Lipid Profile: No results for input(s): CHOL, HDL, LDLCALC, TRIG, CHOLHDL, LDLDIRECT in the last 72 hours. Thyroid Function Tests: No results for input(s): TSH, T4TOTAL, FREET4, T3FREE, THYROIDAB in the last 72 hours. Anemia Panel: No results for input(s): VITAMINB12, FOLATE, FERRITIN, TIBC, IRON, RETICCTPCT in the last 72 hours. Urine analysis:    Component Value Date/Time   COLORURINE YELLOW 02/26/2021 0200   APPEARANCEUR HAZY (A) 02/26/2021 0200   LABSPEC 1.018 02/26/2021 0200   PHURINE 5.0 02/26/2021 0200   GLUCOSEU NEGATIVE 02/26/2021 0200   HGBUR MODERATE (A) 02/26/2021 0200   HGBUR negative 03/29/2007 0859   BILIRUBINUR NEGATIVE 02/26/2021 0200   KETONESUR NEGATIVE 02/26/2021 0200   PROTEINUR  30 (A) 02/26/2021 0200   UROBILINOGEN 0.2 02/02/2014 0736   NITRITE NEGATIVE 02/26/2021 0200   LEUKOCYTESUR NEGATIVE 02/26/2021 0200   Sepsis Labs: @LABRCNTIP (procalcitonin:4,lacticidven:4)  ) Recent Results (from the past 240 hour(s))  Resp Panel by RT-PCR (Flu A&B, Covid) Nasopharyngeal Swab     Status: None   Collection Time: 02/26/21 12:32 AM   Specimen: Nasopharyngeal Swab; Nasopharyngeal(NP) swabs in vial transport medium  Result Value Ref Range Status   SARS Coronavirus 2 by RT PCR NEGATIVE NEGATIVE Final    Comment: (NOTE) SARS-CoV-2 target nucleic acids are NOT DETECTED.  The SARS-CoV-2 RNA is generally detectable in upper respiratory specimens during the acute phase of infection. The lowest concentration of SARS-CoV-2 viral copies this assay can detect is 138 copies/mL. A negative result does not preclude SARS-Cov-2 infection and should not be used as the sole basis for treatment or other patient management decisions. A negative result may occur with  improper specimen collection/handling, submission of specimen other than nasopharyngeal swab, presence of viral mutation(s) within the areas targeted by this assay, and  inadequate number of viral copies(<138 copies/mL). A negative result must be combined with clinical observations, patient history, and epidemiological information. The expected result is Negative.  Fact Sheet for Patients:  EntrepreneurPulse.com.au  Fact Sheet for Healthcare Providers:  IncredibleEmployment.be  This test is no t yet approved or cleared by the [place] FDA and  has been authorized for detection and/or diagnosis of SARS-CoV-2 by FDA under an Emergency Use Authorization (EUA). This EUA will remain  in effect (meaning this test can be used) for the duration of the COVID-19 declaration under Section 564(b)(1) of the Act, 21 U.S.C.section 360bbb-3(b)(1), unless the authorization is terminated  or revoked sooner.       Influenza A by PCR NEGATIVE NEGATIVE Final   Influenza B by PCR NEGATIVE NEGATIVE Final    Comment: (NOTE) The Xpert Xpress SARS-CoV-2/FLU/RSV plus assay is intended as an aid in the diagnosis of influenza from Nasopharyngeal swab specimens and should not be used as a sole basis for treatment. Nasal washings and aspirates are unacceptable for Xpert Xpress SARS-CoV-2/FLU/RSV testing.  Fact Sheet for Patients: EntrepreneurPulse.com.au  Fact Sheet for Healthcare Providers: IncredibleEmployment.be  This test is not yet approved or cleared by the [place] FDA and has been authorized for detection and/or diagnosis of SARS-CoV-2 by FDA under an Emergency Use Authorization (EUA). This EUA will remain in effect (meaning this test can be used) for the duration of the COVID-19 declaration under Section 564(b)(1) of the Act, 21 U.S.C. section 360bbb-3(b)(1), unless the authorization is terminated or revoked.  Performed at [facility], [place] [address], [place], [place] [address]   Urine Culture     Status: None   Collection Time: 02/26/21  2:31 AM   Specimen:  Urine, Clean Catch  Result Value Ref Range Status   Specimen Description   Final    URINE, CLEAN CATCH Performed at [facility], [place] [address], [place], [place] [address]    Special Requests   Final    Immunocompromised Performed at [facility], [place] [address] [place], [place] [address]    Culture   Final    NO GROWTH Performed at [facility], [place] [address] [place], [place] [address]    Report Status 02/27/2021 FINAL  Final  Culture, blood (routine x 2)     Status: None (Preliminary result)   Collection Time: 02/26/21  5:24 AM  Specimen: BLOOD  Result Value Ref Range Status   Specimen Description   Final    BLOOD LEFT ANTECUBITAL Performed at [place] [address] [place], [place] [address]    Special Requests   Final    BOTTLES DRAWN AEROBIC AND ANAEROBIC Blood Culture results may not be optimal due to an inadequate volume of blood received in culture bottles Performed at [place] [address], [place], [place] [address]    Culture   Final    NO GROWTH 2 DAYS Performed at [place] [address] [place], [place] [address]    Report Status PENDING  Incomplete  MRSA Next Gen by PCR, Nasal     Status: None   Collection Time: 02/26/21  5:24 AM   Specimen: Nasal Mucosa; Nasal Swab  Result Value Ref Range Status   MRSA by PCR Next Gen NOT DETECTED NOT DETECTED Final    Comment: (NOTE) The GeneXpert MRSA Assay (FDA approved for NASAL specimens only), is one component of a comprehensive MRSA colonization surveillance program. It is not intended to diagnose MRSA infection nor to guide or monitor treatment for MRSA infections. Test performance is not FDA approved in patients less than 2 years old. Performed at [facility], [place] [address] [place], [place] [address]   MRSA Next Gen by PCR, Nasal     Status: None   Collection Time: 02/26/21   9:11 AM   Specimen: Nasal Mucosa; Nasal Swab  Result Value Ref Range Status   MRSA by PCR Next Gen NOT DETECTED NOT DETECTED Final    Comment: (NOTE) The GeneXpert MRSA Assay (FDA approved for NASAL specimens only), is one component of a comprehensive MRSA colonization surveillance program. It is not intended to diagnose MRSA infection nor to guide or monitor treatment for MRSA infections. Test performance is not FDA approved in patients less than 38 years old. Performed at [facility], [place] [address], [place], [place] [address]   Culture, blood (routine x 2)     Status: None (Preliminary result)   Collection Time: 02/26/21 10:42 AM   Specimen: BLOOD LEFT FOREARM  Result Value Ref Range Status   Specimen Description   Final    BLOOD LEFT FOREARM Performed at [place] [address] [place], [place] [address]    Special Requests   Final    BOTTLES DRAWN AEROBIC ONLY Blood Culture adequate volume Performed at [place] [address], [place], [place] [address]    Culture   Final    NO GROWTH 2 DAYS Performed at [place] [address], [place], [place] [address]    Report Status PENDING  Incomplete         Radiology Studies: IR IVC FILTER PLMT / S&I Burke Keels GUID/MOD SED  Result Date: 02/27/2021 INDICATION: Acute PE.  GI bleed, contraindicating anticoagulation EXAM: ULTRASOUND GUIDANCE FOR VASCULAR ACCESS IVC CATHETERIZATION AND VENOGRAM INFERIOR VENA CAVA FILTER PLACEMENT MEDICATIONS: None. ANESTHESIA/SEDATION: Fentanyl 2 mcg IV; Versed 100 mg IV Sedation Time: 23 minutes; The patient was continuously monitored during the procedure by the interventional radiology nurse under my direct supervision. CONTRAST:  45 mL Omnipaque 300 FLUOROSCOPY TIME:  1 minutes 42 seconds (57 mGy) COMPLICATIONS: None immediate. PROCEDURE: Informed written consent was obtained from the patient following explanation of the  procedure, risks, benefits and alternatives. A time out was performed prior to the initiation of the procedure.  Maximal barrier sterile technique utilized including caps, mask, sterile gowns, sterile gloves, large sterile drape, hand hygiene, and Betadine prep. Under sterile condition and local anesthesia, right common femoral venous access was performed with ultrasound. An ultrasound image was saved and sent to PACS. Over a guidewire, the IVC filter delivery sheath and inner dilator were advanced into the IVC just above the IVC bifurcation. Contrast injection was performed for an IVC venogram. Through the delivery sheath, a retrievable Denali IVC filter was deployed below the level of the renal veins and above the IVC bifurcation. Limited post deployment venacavagram was performed. The delivery sheath was removed and hemostasis was obtained with manual compression. A dressing was placed. The patient tolerated the procedure well without immediate post procedural complication. FINDINGS: The IVC is patent. No evidence of thrombus, stenosis, or occlusion. No variant venous anatomy. Successful placement of the IVC filter below the level of the renal veins. IMPRESSION: Successful placement of a retrievable infrarenal IVC filter, as above. PLAN: IVC filters can cause complications when left in place for extended periods of time. If medically appropriate, recommend discontinuing filter prior to discharge. Please re-evaluate the patient for filter discontinuation when they are seen in follow up, and refer patient to Interventional Radiology for removal. Michaelle Birks, MD [facility] Radiology Electronically Signed   By: Michaelle Birks M.D.   On: 02/27/2021 18:57   ECHOCARDIOGRAM COMPLETE  Result Date: 02/26/2021    ECHOCARDIOGRAM REPORT   Patient Name:   EIRIK TINKER Date of Exam: 02/26/2021 Medical Rec #:  [id number]      Height:       73.0 in Accession #:    [id number]      Weight:       230.0 lb Date of Birth:  1965-10-01      BSA:          2.283 m Patient Age:    55 years       BP:           134/81 mmHg Patient Gender: M              HR:           91 bpm. Exam Location:  Inpatient Procedure: 2D Echo, Cardiac Doppler, Color Doppler and Intracardiac            Opacification Agent Indications:    Pulmonary Embolus I26.09  History:        Patient has no prior history of Echocardiogram examinations.                 Risk Factors:Hypertension.  Sonographer:    Darlina Sicilian RDCS Referring Phys: [place]  1. Left ventricular ejection fraction, by estimation, is 50 to 55%. The left ventricle has low normal function. The left ventricle has no regional wall motion abnormalities. There is mild left ventricular hypertrophy. Left ventricular diastolic parameters were normal.  2. Right ventricular systolic function is moderately reduced. The right ventricular size is mildly enlarged. There is mildly elevated pulmonary artery systolic pressure. The estimated right ventricular systolic pressure is [id number] mmHg.  3. The mitral valve is normal in structure. Trivial mitral valve regurgitation. No evidence of mitral stenosis.  4. The aortic valve is tricuspid. Aortic valve regurgitation is not visualized. No aortic stenosis is present.  5. Pulmonic valve regurgitation is severe.  6. The inferior vena cava is dilated in size with <50% respiratory variability, suggesting right atrial pressure of 15  mmHg. FINDINGS  Left Ventricle: Left ventricular ejection fraction, by estimation, is 50 to 55%. The left ventricle has low normal function. The left ventricle has no regional wall motion abnormalities. Definity contrast agent was given IV to delineate the left ventricular endocardial borders. The left ventricular internal cavity size was normal in size. There is mild left ventricular hypertrophy. Left ventricular diastolic parameters were normal. Right Ventricle: The right ventricular size  is mildly enlarged. Right vetricular wall thickness was not well visualized. Right ventricular systolic function is moderately reduced. There is mildly elevated pulmonary artery systolic pressure. The tricuspid  regurgitant velocity is 2.67 m/s, and with an assumed right atrial pressure of 15 mmHg, the estimated right ventricular systolic pressure is [id number] mmHg. Left Atrium: Left atrial size was normal in size. Right Atrium: Right atrial size was normal in size. Pericardium: Trivial pericardial effusion is present. Mitral Valve: The mitral valve is normal in structure. Trivial mitral valve regurgitation. No evidence of mitral valve stenosis. Tricuspid Valve: The tricuspid valve is normal in structure. Tricuspid valve regurgitation is mild . No evidence of tricuspid stenosis. Aortic Valve: The aortic valve is tricuspid. Aortic valve regurgitation is not visualized. No aortic stenosis is present. Pulmonic Valve: The pulmonic valve was normal in structure. Pulmonic valve regurgitation is severe. No evidence of pulmonic stenosis. Aorta: The aortic root is normal in size and structure. Venous: The inferior vena cava is dilated in size with less than 50% respiratory variability, suggesting right atrial pressure of 15 mmHg. IAS/Shunts: No atrial level shunt detected by color flow Doppler.  LEFT VENTRICLE PLAX 2D LVIDd:         4.80 cm      Diastology LVIDs:         3.00 cm      LV e' medial:    9.95 cm/s LV PW:         1.10 cm      LV E/e' medial:  10.1 LV IVS:        1.20 cm      LV e' lateral:   16.25 cm/s LVOT diam:     2.20 cm      LV E/e' lateral: 6.2 LV SV:         67 LV SV Index:   29 LVOT Area:     3.80 cm  LV Volumes (MOD) LV vol d, MOD A2C: 89.2 ml LV vol d, MOD A4C: 122.0 ml LV vol s, MOD A2C: 34.6 ml LV vol s, MOD A4C: 45.7 ml LV SV MOD A2C:     54.6 ml LV SV MOD A4C:     122.0 ml LV SV MOD BP:      62.7 ml RIGHT VENTRICLE RV S prime:     15.40 cm/s TAPSE (M-mode): 1.9 cm LEFT ATRIUM             Index         RIGHT ATRIUM           Index LA diam:        4.20 cm 1.84 cm/m   RA Area:     21.80 cm LA Vol (A2C):   58.5 ml 25.62 ml/m  RA Volume:   63.20 ml  27.68 ml/m LA Vol (A4C):   53.6 ml 23.47 ml/m LA Biplane Vol: 58.8 ml 25.75 ml/m  AORTIC VALVE             PULMONIC VALVE LVOT Vmax:   112.00 cm/s RVOT Peak grad: 3  mmHg LVOT Vmean:  67.900 cm/s LVOT VTI:    0.175 m  AORTA Ao Root diam: 3.00 cm Ao Asc diam:  3.40 cm MITRAL VALVE                TRICUSPID VALVE MV Area (PHT): 5.16 cm     TR Peak grad:   28.5 mmHg MV Decel Time: 147 msec     TR Vmax:        267.00 cm/s MV E velocity: 100.15 cm/s MV A velocity: 47.75 cm/s   SHUNTS MV E/A ratio:  2.10         Systemic VTI:  0.18 m                             Systemic Diam: 2.20 cm                             Pulmonic VTI:  0.182 m Weston Brass MD Electronically signed by Weston Brass MD Signature Date/Time: 02/26/2021/5:54:05 PM    Final    VAS [place] LOWER EXTREMITY VENOUS (DVT)  Result Date: 02/27/2021  Lower Venous DVT Study Patient Name:  QUEST TAVENNER  Date of Exam:   02/26/2021 Medical Rec #: [id number]       Accession #:    [id number] Date of Birth: Jul 24, 1965       Patient Gender: M Patient Age:   89 years Exam Location:  [facility] Procedure:      VAS [place] LOWER EXTREMITY VENOUS (DVT) Referring Phys: [facility] --------------------------------------------------------------------------------  Indications: Pulmonary embolism.  Comparison Study: No previous exams Performing Technologist: Jody Hill RVT, RDMS  Examination Guidelines: A complete evaluation includes B-mode imaging, spectral Doppler, color Doppler, and power Doppler as needed of all accessible portions of each vessel. Bilateral testing is considered an integral part of a complete examination. Limited examinations for reoccurring indications may be performed as noted. The reflux portion of the exam is performed with the patient in reverse Trendelenburg.   +---------+---------------+---------+-----------+----------+--------------+ RIGHT    CompressibilityPhasicitySpontaneityPropertiesThrombus Aging +---------+---------------+---------+-----------+----------+--------------+ CFV      Full           Yes      Yes                                 +---------+---------------+---------+-----------+----------+--------------+ SFJ      Full                                                        +---------+---------------+---------+-----------+----------+--------------+ FV Prox  Full           Yes      Yes                                 +---------+---------------+---------+-----------+----------+--------------+ FV Mid   Full           Yes      Yes                                 +---------+---------------+---------+-----------+----------+--------------+ FV DistalFull  Yes      Yes                                 +---------+---------------+---------+-----------+----------+--------------+ PFV      Full                                                        +---------+---------------+---------+-----------+----------+--------------+ POP      Full           Yes      Yes                                 +---------+---------------+---------+-----------+----------+--------------+ PTV      Full                                                        +---------+---------------+---------+-----------+----------+--------------+ PERO     Full                                                        +---------+---------------+---------+-----------+----------+--------------+   +---------+---------------+---------+-----------+----------+--------------+ LEFT     CompressibilityPhasicitySpontaneityPropertiesThrombus Aging +---------+---------------+---------+-----------+----------+--------------+ CFV      Full           Yes      Yes                                  +---------+---------------+---------+-----------+----------+--------------+ SFJ      Full                                                        +---------+---------------+---------+-----------+----------+--------------+ FV Prox  Full           Yes      Yes                                 +---------+---------------+---------+-----------+----------+--------------+ FV Mid   Full           Yes      Yes                                 +---------+---------------+---------+-----------+----------+--------------+ FV DistalFull           Yes      Yes                                 +---------+---------------+---------+-----------+----------+--------------+ PFV      Full                                                        +---------+---------------+---------+-----------+----------+--------------+  POP      Full           Yes      Yes                                 +---------+---------------+---------+-----------+----------+--------------+ PTV      Full                                                        +---------+---------------+---------+-----------+----------+--------------+ PERO     Full                                                        +---------+---------------+---------+-----------+----------+--------------+     Summary: BILATERAL: - No evidence of deep vein thrombosis seen in the lower extremities, bilaterally. - No evidence of superficial venous thrombosis in the lower extremities, bilaterally. -No evidence of popliteal cyst, bilaterally.  LEFT: Subcutaneous edema seen in left calf.  *See table(s) above for measurements and observations. Electronically signed by Orlie Pollen on 02/27/2021 at 10:48:18 AM.    Final         Scheduled Meds:  abacavir-dolutegravir-lamiVUDine  1 tablet Oral Daily   Chlorhexidine Gluconate Cloth  6 each Topical Daily   guaiFENesin  5 mL Oral TID   pantoprazole (PROTONIX) IV  40 mg Intravenous Q12H   Continuous  Infusions:  sodium chloride 100 mL/hr at 02/28/21 0754   azithromycin Stopped (02/28/21 [id number])   cefTRIAXone (ROCEPHIN)  IV Stopped (02/28/21 0721)     LOS: 2 days    Time spent: 22 min    Desma Maxim, MD [facility]   If 7PM-7AM, please contact night-coverage [email or web address] Password [email or web address] 02/28/2021, 11:14 AM

## 2021-02-28 NOTE — Progress Notes (Signed)
ANTICOAGULATION CONSULT NOTE - Follow Up Consult  Pharmacy Consult for IV heparin Indication: pulmonary embolus  No Known Allergies  Patient Measurements: Height: 6\' 1"  (185.4 cm) Weight: 104.3 kg (230 lb) IBW/kg (Calculated) : 79.9 Heparin Dosing Weight: 104.3 kg   Vital Signs: Temp: 98.2 F (36.8 C) (11/26 1255) Temp Source: Oral (11/26 1255) BP: 138/88 (11/26 1020) Pulse Rate: 109 (11/26 1020)  Labs: Recent Labs    02/27/21 0248 02/27/21 0426 02/27/21 0646 02/27/21 1545 02/28/21 0259  HGB  --    < > 7.7* 7.4* 8.0*  HCT  --    < > 24.8* 23.9* 26.3*  PLT  --    < > 470* 394 369  CREATININE 1.24  --  1.13  --  0.98   < > = values in this interval not displayed.    Estimated Creatinine Clearance: 110.6 mL/min (by C-G formula based on SCr of 0.98 mg/dL).   Medications:  Scheduled:   abacavir-dolutegravir-lamiVUDine  1 tablet Oral Daily   Chlorhexidine Gluconate Cloth  6 each Topical Daily   guaiFENesin  5 mL Oral TID   pantoprazole (PROTONIX) IV  40 mg Intravenous Q12H    Assessment: Pharmacy is consulted to dose heparin in 55 yo male diagnosed with PE with right heart strain. Pt not initially started on anticoagulation as Hgb was 4.4. Pt has received several transfusions during admission. IV filter placed on 11/25   Today, 02/28/21  Hgb up to 8.0  Plt 369 Scr 0.98 mg/dl, CrCl > 03/02/21 ml/min  Per discussion eith MD, would like to avoid boluses and aim for lower heparin target range on 0.3 to 0.5 due to recent   bleeds    Goal of Therapy:  Heparin level 0.3-0.5 units/ml Monitor platelets by anticoagulation protocol: Yes   Plan:  No bolus as per consult  Heparin 1600 units/hr  Obtain HL 6 hours after start of infusion  Daily CBC while  on heparin Monitor for signs and symptoms of bleeding  767, PharmD, BCPS 02/28/2021 1:10 PM

## 2021-02-28 NOTE — Anesthesia Preprocedure Evaluation (Addendum)
Anesthesia Evaluation  Patient identified by MRN, date of birth, ID band Patient awake    Reviewed: Allergy & Precautions, NPO status , Patient's Chart, lab work & pertinent test results  History of Anesthesia Complications Negative for: history of anesthetic complications  Airway Mallampati: I  TM Distance: >3 FB Neck ROM: Full    Dental  (+) Dental Advisory Given   Pulmonary former smoker, PE  S/p IVC filter placement yesterday    Pulmonary exam normal        Cardiovascular hypertension, Pt. on medications + DVT   Rhythm:Regular Rate:Tachycardia   '22 TTE - EF 50 to 55%. Mild left ventricular hypertrophy.  RV systolic function is moderately reduced. RV size is mildly enlarged. There is mildly elevated pulmonary  artery systolic pressure. The estimated right ventricular systolic pressure is 69.6 mmHg. Trivial MR. Severe PR.    Neuro/Psych  Headaches,  Neuromuscular disease (postherpetic neuralgia) negative psych ROS   GI/Hepatic negative GI ROS, (+)     substance abuse (sober x 4 mos)  alcohol use,   Endo/Other   Obesity   Renal/GU negative Renal ROS     Musculoskeletal  (+) Arthritis ,   Abdominal   Peds  Hematology  (+) anemia , HIV,   Anesthesia Other Findings   Reproductive/Obstetrics                            Anesthesia Physical Anesthesia Plan  ASA: 4  Anesthesia Plan: MAC   Post-op Pain Management: Minimal or no pain anticipated   Induction:   PONV Risk Score and Plan: 1 and Propofol infusion and Treatment may vary due to age or medical condition  Airway Management Planned: Nasal Cannula and Natural Airway  Additional Equipment: None  Intra-op Plan:   Post-operative Plan:   Informed Consent: I have reviewed the patients History and Physical, chart, labs and discussed the procedure including the risks, benefits and alternatives for the proposed anesthesia  with the patient or authorized representative who has indicated his/her understanding and acceptance.       Plan Discussed with: CRNA, Anesthesiologist and Surgeon  Anesthesia Plan Comments:        Anesthesia Quick Evaluation

## 2021-02-28 NOTE — Op Note (Signed)
[facility] Patient Name: Leon Nichols Procedure Date: 02/28/2021 MRN: [id number] Attending MD: Willis Modena , MD Date of Birth: [birth year] CSN: [id number] Age: 55 Admit Type: Inpatient Procedure:                Upper GI endoscopy Indications:              Iron deficiency anemia secondary to chronic blood                            loss Providers:                Willis Modena, MD, Rozetta Nunnery, Technician,                            Nehemiah Settle Person Referring MD:              Medicines:                Monitored Anesthesia Care Complications:            No immediate complications. Estimated Blood Loss:     Estimated blood loss: none. Procedure:                Pre-Anesthesia Assessment:                           - Prior to the procedure, a History and Physical                            was performed, and patient medications and                            allergies were reviewed. The patient's tolerance of                            previous anesthesia was also reviewed. The risks                            and benefits of the procedure and the sedation                            options and risks were discussed with the patient.                            All questions were answered, and informed consent                            was obtained. Prior Anticoagulants: The patient has                            taken no previous anticoagulant or antiplatelet                            agents. ASA Grade Assessment: IV - A patient with                            severe systemic disease  that is a constant threat                            to life. After reviewing the risks and benefits,                            the patient was deemed in satisfactory condition to                            undergo the procedure.                           After obtaining informed consent, the endoscope was                            passed under direct vision. Throughout the                             procedure, the patient's blood pressure, pulse, and                            oxygen saturations were monitored continuously. The                            [id number]-[id number] ([id number]) Olympus endoscope was introduced                            through the mouth, and advanced to the second part                            of duodenum. The upper GI endoscopy was                            accomplished without difficulty. The patient                            tolerated the procedure well. Scope In: Scope Out: Findings:      The examined esophagus was normal.      The entire examined stomach was normal.      The duodenal bulb, first portion of the duodenum and second portion of       the duodenum were normal. Impression:               - Normal esophagus.                           - Normal stomach.                           - Normal duodenal bulb, first portion of the                            duodenum and second portion of the duodenum.                           -  No source of anemia on endoscopy. Moderate Sedation:      Not Applicable - Patient had care per Anesthesia. Recommendation:           - Perform a colonoscopy today. Procedure Code(s):        --- Professional ---                           [phone], Esophagogastroduodenoscopy, flexible,                            transoral; diagnostic, including collection of                            specimen(s) by brushing or washing, when performed                            (separate procedure) Diagnosis Code(s):        --- Professional ---                           D50.0, Iron deficiency anemia secondary to blood                            loss (chronic) CPT copyright 2019 [employer]. All rights reserved. The codes documented in this report are preliminary and upon coder review may  be revised to meet current compliance requirements. Willis Modena, MD 02/28/2021 10:09:00 AM This report has been signed  electronically. Number of Addenda: 0

## 2021-03-01 ENCOUNTER — Encounter (HOSPITAL_COMMUNITY): Payer: Self-pay | Admitting: Gastroenterology

## 2021-03-01 DIAGNOSIS — D649 Anemia, unspecified: Secondary | ICD-10-CM

## 2021-03-01 DIAGNOSIS — R310 Gross hematuria: Secondary | ICD-10-CM

## 2021-03-01 DIAGNOSIS — I10 Essential (primary) hypertension: Secondary | ICD-10-CM

## 2021-03-01 DIAGNOSIS — J189 Pneumonia, unspecified organism: Secondary | ICD-10-CM

## 2021-03-01 LAB — CBC
HCT: 25.8 % — ABNORMAL LOW (ref 39.0–52.0)
Hemoglobin: 7.7 g/dL — ABNORMAL LOW (ref 13.0–17.0)
MCH: 27.9 pg (ref 26.0–34.0)
MCHC: 29.8 g/dL — ABNORMAL LOW (ref 30.0–36.0)
MCV: 93.5 fL (ref 80.0–100.0)
Platelets: 359 10*3/uL (ref 150–400)
RBC: 2.76 MIL/uL — ABNORMAL LOW (ref 4.22–5.81)
RDW: 18.1 % — ABNORMAL HIGH (ref 11.5–15.5)
WBC: 13.7 10*3/uL — ABNORMAL HIGH (ref 4.0–10.5)
nRBC: 0 % (ref 0.0–0.2)

## 2021-03-01 LAB — HEPARIN LEVEL (UNFRACTIONATED)
Heparin Unfractionated: <0.1 IU/mL — ABNORMAL LOW (ref 0.30–0.70)
Heparin Unfractionated: <0.1 IU/mL — ABNORMAL LOW (ref 0.30–0.70)
Heparin Unfractionated: <0.1 IU/mL — ABNORMAL LOW (ref 0.30–0.70)

## 2021-03-01 LAB — BASIC METABOLIC PANEL
Anion gap: 6 (ref 5–15)
BUN: 14 mg/dL (ref 6–20)
CO2: 18 mmol/L — ABNORMAL LOW (ref 22–32)
Calcium: 8.6 mg/dL — ABNORMAL LOW (ref 8.9–10.3)
Chloride: 109 mmol/L (ref 98–111)
Creatinine, Ser: 1.17 mg/dL (ref 0.61–1.24)
GFR, Estimated: >60 mL/min (ref >60)
Glucose, Bld: 106 mg/dL — ABNORMAL HIGH (ref 70–99)
Potassium: 4.6 mmol/L (ref 3.5–5.1)
Sodium: 133 mmol/L — ABNORMAL LOW (ref 135–145)

## 2021-03-01 MED ORDER — AMLODIPINE BESYLATE 5 MG PO TABS
2.5000 mg | ORAL_TABLET | Freq: Every day | ORAL | Status: DC
Start: 2021-03-01 — End: 2021-03-01
  Administered 2021-03-01: 11:00:00 2.5 mg via ORAL
  Filled 2021-03-01: qty 1

## 2021-03-01 MED ORDER — AMLODIPINE BESYLATE 5 MG PO TABS
2.5000 mg | ORAL_TABLET | Freq: Once | ORAL | Status: AC
  Administered 2021-03-01: 17:00:00 2.5 mg via ORAL
  Filled 2021-03-01: qty 1

## 2021-03-01 MED ORDER — LIDOCAINE VISCOUS HCL 2 % MT SOLN
15.0000 mL | Freq: Four times a day (QID) | OROMUCOSAL | Status: DC | PRN
  Filled 2021-03-01 (×2): qty 15

## 2021-03-01 MED ORDER — TRAMADOL HCL 50 MG PO TABS
50.0000 mg | ORAL_TABLET | Freq: Four times a day (QID) | ORAL | Status: DC | PRN
  Administered 2021-03-01 – 2021-03-02 (×3): 50 mg via ORAL
  Filled 2021-03-01 (×3): qty 1

## 2021-03-01 MED ORDER — HYDRALAZINE HCL 20 MG/ML IJ SOLN
10.0000 mg | Freq: Four times a day (QID) | INTRAMUSCULAR | Status: DC | PRN
  Filled 2021-03-01: qty 1

## 2021-03-01 MED ORDER — ALUM & MAG HYDROXIDE-SIMETH 200-200-20 MG/5ML PO SUSP
30.0000 mL | Freq: Four times a day (QID) | ORAL | Status: DC | PRN
  Administered 2021-03-01: 20:00:00 30 mL via ORAL
  Filled 2021-03-01: qty 30

## 2021-03-01 MED ORDER — SIMETHICONE 40 MG/0.6ML PO SUSP
40.0000 mg | Freq: Once | ORAL | Status: AC
  Administered 2021-03-01: 40 mg via ORAL
  Filled 2021-03-01: qty 0.6

## 2021-03-01 MED ORDER — HEPARIN (PORCINE) 25000 UT/250ML-% IV SOLN
2550.0000 [IU]/h | INTRAVENOUS | Status: DC
  Administered 2021-03-01: 18:00:00 2300 [IU]/h via INTRAVENOUS
  Administered 2021-03-02: 05:00:00 2550 [IU]/h via INTRAVENOUS
  Filled 2021-03-01 (×2): qty 250

## 2021-03-01 MED ORDER — AMLODIPINE BESYLATE 5 MG PO TABS
5.0000 mg | ORAL_TABLET | Freq: Every day | ORAL | Status: DC
  Administered 2021-03-02: 11:00:00 5 mg via ORAL
  Filled 2021-03-01 (×3): qty 1

## 2021-03-01 NOTE — Progress Notes (Signed)
PROGRESS NOTE    Leon Nichols  [id number] DOB: 1965-07-10 DOA: 02/25/2021 PCP: Pcp, No   Chief Complaint  Patient presents with   Shortness of Breath    Brief Narrative:  Hx HIV, HTN, EtOH, colostomy, presenting on 11/23 with lower extremity swelling and dyspnea and pleuritic right chest pain. Here found to have b/l PEs with reduced RV function on TTE, also hemoglobin in 4s from normal in February of this year. No report of melena or hematochezia but does report one month of intermittent gross blood in urine for about a month.  Patient transfused a total of 4 units packed red blood cells.  GI consulted and patient underwent upper endoscopy with no source of bleeding noted, colonoscopy was attempted but due to poor prep may need to be repeated.  Patient seen by urology due to complaints of hematuria and work-up underway.   Assessment & Plan:   Principal Problem:   Acute pulmonary embolism (HCC) Active Problems:   Anemia   HIV disease ([place])   Acute kidney injury ([place])   Acute DVT (deep venous thrombosis) (HCC)   Occult blood in stools   Pyuria, sterile   Acute pulmonary embolism without acute cor pulmonale (HCC)  #1 acute hypoxic respiratory failure/bilateral PE -Patient presented with a symptomatic acute bilateral PE, CT chest done concerning for right ventricular strain -2D echo done also with concerns for right ventricular strain. -Lower extremity Dopplers negative for DVT. -IVC filter placed 02/27/2021 per IR -Case discussed by Dr. Si Raider with vascular surgery who advised starting anticoagulation which was ordered with no bolus and aiming for the low end of therapeutic window, if further bleeding will likely need to discontinue anticoagulation. -Dr. Si Raider, discussed with IR and was wondering whether thrombectomy or lysis may be helpful however per IR due to location of PE not suitable for thrombectomy/lysis. -O2 being weaned down. -Supportive care.  2.   Hematuria -Patient with complaints of intermittent gross hematuria over the past month with no history of urinary malignancy. -Urinalysis with no gross hematuria, urine cultures negative. -With clear yellow urine. -Urology consulted and following and advised CT renal stone protocol/urogram however patient unable to lay flat and as such test postponed would likely try again tomorrow if continued clinical improvement. -Renal ultrasound with no acute abnormalities, negative for hydronephrosis. -Urology following and appreciate input and recommendations.  3.  Symptomatic anemia/FOBT -On presentation patient noted to have hemoglobin in the fours which was normal 05/2020. -Patient denied any overt melena or hematochezia however positive guaiac. -Patient did endorse some intermittent hematuria. -GI consulted, patient underwent upper endoscopy 02/28/2021 with no source of bleeding noted. -Colonoscopy done 02/28/2021 with poor prep and unrevealing, GI to reassess and tentatively plan for repeat colonoscopy tomorrow. -GI recommended full liquid diet which we will continue for now. -Status post transfusion 4 units packed red blood cells during the hospitalization. -LDH noted to be low haptoglobin elevated on 02/2023 with low suspicion for hemolysis. -Anemia panel consistent with anemia of chronic disease with iron level of 16, TIBC of 159, ferritin of 1499.  Vitamin B12 480. -Continue IV PPI every 12 hours -GI following. -Transfusing for total hemoglobin < 7.  4.  HIV -Continue triumeq. -Compliant with medications, outpatient follow-up with ID.  5.  Hypertension -Start Norvasc 5 mg daily and will uptitrate to 10 mg daily for better blood pressure control.  6.  Pulmonary infiltrate/Probable CAP -Concern for pneumonia noted on CT chest and chest x-ray. -Blood cultures pending. -MRSA PCR negative. -  Urine Legionella antigen negative, urine pneumococcus antigen negative. -Status post 3 days  azithromycin. -Continue IV Rocephin.   DVT prophylaxis: Heparin Code Status: Full Family Communication: Updated patient.  No family at bedside. Disposition:   Status is: Inpatient  Remains inpatient appropriate because: Severity of illness       Consultants:  Interventional radiology Urology: Dr. Abner Greenspan 02/28/2021 Gastroenterology: Dr. Alessandra Bevels 02/26/2021 PCCM admission: Dr.Jeong   Procedures:  CT angiogram chest 02/26/2021 Chest x-ray 02/25/2021 Ultrasound-guided for vascular access/IVC catheterization and venogram/IVC filter placement--per IR, Dr.Mugweru 02/27/2021 Renal ultrasound 02/28/2021 2D echo 02/26/2021 Lower extremity Dopplers 02/26/2021 CT hematuria work-up pending Colonoscopy 02/28/2021-Per Dr. Paulita Fujita  Colonoscopy 02/28/2021-Per Dr. Paulita Fujita Transfuse 4 units packed red blood cells 02/26/2021   Antimicrobials: IV Rocephin 02/27/2021>>>>>   Subjective: Sitting up in bed complaining of chest pain with coughing that he describes sometimes as a burning sensation and sometimes as a soreness.  Shortness of breath improving.  States unable to lay flat for CT yesterday as he states when he lays flat because of his breathing.  Willing to attempt CT renal stone protocol tomorrow.  Tolerating current diet.  Patient with yellow urine noted in urinal.  Objective: Vitals:   03/01/21 0700 03/01/21 0825 03/01/21 0900 03/01/21 1000  BP: 139/86  (!) 129/91 (!) 158/90  Pulse:      Resp: 20  (!) 39 (!) 29  Temp:  98.3 F (36.8 C)    TempSrc:  Oral    SpO2: 100%     Weight:      Height:        Intake/Output Summary (Last 24 hours) at 03/01/2021 1046 Last data filed at 03/01/2021 1000 Gross per 24 hour  Intake 2022.99 ml  Output 650 ml  Net 1372.99 ml   Filed Weights   02/27/21 0500 02/28/21 0848 03/01/21 0430  Weight: 104.3 kg 104.3 kg 110.5 kg    Examination:  General exam: Appears calm and comfortable  Respiratory system: Decreased breath sounds in the  bases.  No wheezes, no crackles.  Speaking in full sentences. Cardiovascular system: Tachycardia. No JVD, murmurs, rubs, gallops or clicks. No pedal edema. Gastrointestinal system: Abdomen is nondistended, soft and nontender. No organomegaly or masses felt. Normal bowel sounds heard.  Ostomy bag intact. Central nervous system: Alert and oriented. No focal neurological deficits. Extremities: Symmetric 5 x 5 power. Skin: No rashes, lesions or ulcers Psychiatry: Judgement and insight appear normal. Mood & affect appropriate.     Data Reviewed: I have personally reviewed following labs and imaging studies  CBC: Recent Labs  Lab 02/25/21 2349 02/26/21 0524 02/27/21 0426 02/27/21 0646 02/27/21 1545 02/28/21 0259 02/28/21 1506 03/01/21 0527  WBC 11.2*   < > 9.9 13.3* 13.9* 16.1*  --  13.7*  NEUTROABS 7.5  --  7.0 10.3*  --   --   --   --   HGB 4.2*   < > 11.5* 7.7* 7.4* 8.0* 7.8* 7.7*  HCT 14.5*   < > 40.1 24.8* 23.9* 26.3*  --  25.8*  MCV 91.8   < > 98.3 91.9 92.3 93.6  --  93.5  PLT 413*   < > 296 470* 394 369  --  359   < > = values in this interval not displayed.    Basic Metabolic Panel: Recent Labs  Lab 02/26/21 0524 02/27/21 0248 02/27/21 0646 02/28/21 0259 03/01/21 0527  NA 133* 134* 134* 133* 133*  K 5.2* 5.1 4.9 4.7 4.6  CL 106 111 110 109  109  CO2 17* 17* 16* 17* 18*  GLUCOSE 110* 105* 104* 115* 106*  BUN 42* 24* 21* 13 14  CREATININE 1.89* 1.24 1.13 0.98 1.17  CALCIUM 8.1* 8.3* 8.5* 8.6* 8.6*  MG 2.5*  --   --   --   --     GFR: Estimated Creatinine Clearance: 95.1 mL/min (by C-G formula based on SCr of 1.17 mg/dL).  Liver Function Tests: Recent Labs  Lab 02/25/21 2349 02/28/21 0259  AST 12* 11*  ALT 7 6  ALKPHOS 84 98  BILITOT 0.5 1.2  PROT 8.1 8.0  ALBUMIN 2.4* 2.3*    CBG: No results for input(s): GLUCAP in the last 168 hours.   Recent Results (from the past 240 hour(s))  Resp Panel by RT-PCR (Flu A&B, Covid) Nasopharyngeal Swab      Status: None   Collection Time: 02/26/21 12:32 AM   Specimen: Nasopharyngeal Swab; Nasopharyngeal(NP) swabs in vial transport medium  Result Value Ref Range Status   SARS Coronavirus 2 by RT PCR NEGATIVE NEGATIVE Final    Comment: (NOTE) SARS-CoV-2 target nucleic acids are NOT DETECTED.  The SARS-CoV-2 RNA is generally detectable in upper respiratory specimens during the acute phase of infection. The lowest concentration of SARS-CoV-2 viral copies this assay can detect is 138 copies/mL. A negative result does not preclude SARS-Cov-2 infection and should not be used as the sole basis for treatment or other patient management decisions. A negative result may occur with  improper specimen collection/handling, submission of specimen other than nasopharyngeal swab, presence of viral mutation(s) within the areas targeted by this assay, and inadequate number of viral copies(<138 copies/mL). A negative result must be combined with clinical observations, patient history, and epidemiological information. The expected result is Negative.  Fact Sheet for Patients:  EntrepreneurPulse.com.au  Fact Sheet for Healthcare Providers:  IncredibleEmployment.be  This test is no t yet approved or cleared by the [place] FDA and  has been authorized for detection and/or diagnosis of SARS-CoV-2 by FDA under an Emergency Use Authorization (EUA). This EUA will remain  in effect (meaning this test can be used) for the duration of the COVID-19 declaration under Section 564(b)(1) of the Act, 21 U.S.C.section 360bbb-3(b)(1), unless the authorization is terminated  or revoked sooner.       Influenza A by PCR NEGATIVE NEGATIVE Final   Influenza B by PCR NEGATIVE NEGATIVE Final    Comment: (NOTE) The Xpert Xpress SARS-CoV-2/FLU/RSV plus assay is intended as an aid in the diagnosis of influenza from Nasopharyngeal swab specimens and should not be used as a sole basis  for treatment. Nasal washings and aspirates are unacceptable for Xpert Xpress SARS-CoV-2/FLU/RSV testing.  Fact Sheet for Patients: EntrepreneurPulse.com.au  Fact Sheet for Healthcare Providers: IncredibleEmployment.be  This test is not yet approved or cleared by the [place] FDA and has been authorized for detection and/or diagnosis of SARS-CoV-2 by FDA under an Emergency Use Authorization (EUA). This EUA will remain in effect (meaning this test can be used) for the duration of the COVID-19 declaration under Section 564(b)(1) of the Act, 21 U.S.C. section 360bbb-3(b)(1), unless the authorization is terminated or revoked.  Performed at [facility], [place] [address] [place], [place] [address]   Urine Culture     Status: None   Collection Time: 02/26/21  2:31 AM   Specimen: Urine, Clean Catch  Result Value Ref Range Status   Specimen Description   Final    URINE, CLEAN CATCH Performed at [facility], [place] [address], [place], [place] [address]    Special Requests   Final    Immunocompromised Performed at [facility], [place] [address], [place], [place] [address]    Culture   Final    NO GROWTH Performed at [facility], [place] [address], [place], [place] [address]    Report Status 02/27/2021 FINAL  Final  Culture, blood (routine x 2)     Status: None (Preliminary result)   Collection Time: 02/26/21  5:24 AM   Specimen: BLOOD  Result Value Ref Range Status   Specimen Description   Final    BLOOD LEFT ANTECUBITAL Performed at [place] [address] [place], [place] [address]    Special Requests   Final    BOTTLES DRAWN AEROBIC AND ANAEROBIC Blood Culture results may not be optimal due to an inadequate volume of blood received in culture bottles Performed at [place] [address] [place], [place] [address]    Culture    Final    NO GROWTH 3 DAYS Performed at [facility], [place] [address] [place], [place] [address]    Report Status PENDING  Incomplete  MRSA Next Gen by PCR, Nasal     Status: None   Collection Time: 02/26/21  5:24 AM   Specimen: Nasal Mucosa; Nasal Swab  Result Value Ref Range Status   MRSA by PCR Next Gen NOT DETECTED NOT DETECTED Final    Comment: (NOTE) The GeneXpert MRSA Assay (FDA approved for NASAL specimens only), is one component of a comprehensive MRSA colonization surveillance program. It is not intended to diagnose MRSA infection nor to guide or monitor treatment for MRSA infections. Test performance is not FDA approved in patients less than 5 years old. Performed at [facility], [place] [address] [place], [place] [address]   MRSA Next Gen by PCR, Nasal     Status: None   Collection Time: 02/26/21  9:11 AM   Specimen: Nasal Mucosa; Nasal Swab  Result Value Ref Range Status   MRSA by PCR Next Gen NOT DETECTED NOT DETECTED Final    Comment: (NOTE) The GeneXpert MRSA Assay (FDA approved for NASAL specimens only), is one component of a comprehensive MRSA colonization surveillance program. It is not intended to diagnose MRSA infection nor to guide or monitor treatment for MRSA infections. Test performance is not FDA approved in patients less than 29 years old. Performed at [facility], [place] [address] [place], [place] [address]   Culture, blood (routine x 2)     Status: None (Preliminary result)   Collection Time: 02/26/21 10:42 AM   Specimen: BLOOD LEFT FOREARM  Result Value Ref Range Status   Specimen Description   Final    BLOOD LEFT FOREARM Performed at [place] [address] [place], [place] [address]    Special Requests   Final    BOTTLES DRAWN AEROBIC ONLY Blood Culture adequate volume Performed at [place] [address], [place], [place] [address]    Culture    Final    NO GROWTH 3 DAYS Performed at [facility], [place] [address] [place], [place] [address]    Report Status PENDING  Incomplete         Radiology Studies: IR IVC FILTER PLMT / S&I Burke Keels GUID/MOD SED  Result Date: 02/27/2021 INDICATION: Acute PE.  GI bleed, contraindicating anticoagulation EXAM: ULTRASOUND GUIDANCE  FOR VASCULAR ACCESS IVC CATHETERIZATION AND VENOGRAM INFERIOR VENA CAVA FILTER PLACEMENT MEDICATIONS: None. ANESTHESIA/SEDATION: Fentanyl 2 mcg IV; Versed 100 mg IV Sedation Time: 23 minutes; The patient was continuously monitored during the procedure by the interventional radiology nurse under my direct supervision. CONTRAST:  45 mL Omnipaque 300 FLUOROSCOPY TIME:  1 minutes 42 seconds (57 mGy) COMPLICATIONS: None immediate. PROCEDURE: Informed written consent was obtained from the patient following explanation of the procedure, risks, benefits and alternatives. A time out was performed prior to the initiation of the procedure. Maximal barrier sterile technique utilized including caps, mask, sterile gowns, sterile gloves, large sterile drape, hand hygiene, and Betadine prep. Under sterile condition and local anesthesia, right common femoral venous access was performed with ultrasound. An ultrasound image was saved and sent to PACS. Over a guidewire, the IVC filter delivery sheath and inner dilator were advanced into the IVC just above the IVC bifurcation. Contrast injection was performed for an IVC venogram. Through the delivery sheath, a retrievable Denali IVC filter was deployed below the level of the renal veins and above the IVC bifurcation. Limited post deployment venacavagram was performed. The delivery sheath was removed and hemostasis was obtained with manual compression. A dressing was placed. The patient tolerated the procedure well without immediate post procedural complication. FINDINGS: The IVC is patent. No evidence of thrombus, stenosis, or occlusion. No variant  venous anatomy. Successful placement of the IVC filter below the level of the renal veins. IMPRESSION: Successful placement of a retrievable infrarenal IVC filter, as above. PLAN: IVC filters can cause complications when left in place for extended periods of time. If medically appropriate, recommend discontinuing filter prior to discharge. Please re-evaluate the patient for filter discontinuation when they are seen in follow up, and refer patient to Interventional Radiology for removal. Roanna Banning, MD [facility] Radiology Electronically Signed   By: Roanna Banning M.D.   On: 02/27/2021 18:57   US RENAL  Result Date: 02/28/2021 CLINICAL DATA:  Gross hematuria, history HIV, hypertension EXAM: RENAL / URINARY TRACT ULTRASOUND COMPLETE COMPARISON:  CT abdomen and pelvis 10/25/2017 FINDINGS: Right Kidney: Renal measurements: 12.7 x 6.5 x 6.6 cm = volume: 284 mL. Normal cortical thickness. Upper normal cortical echogenicity. No mass, hydronephrosis, or shadowing calcification. Left Kidney: Renal measurements: 12.4 x 7.4 x 7.1 cm = volume: 340 mL. Normal cortical thickness. Upper normal cortical echogenicity. No mass, hydronephrosis, or shadowing calcification. Bladder: Appears normal for degree of bladder distention. Other: N/A IMPRESSION: No renal sonographic abnormalities identified. Electronically Signed   By: Ulyses Southward M.D.   On: 02/28/2021 15:44        Scheduled Meds:  abacavir-dolutegravir-lamiVUDine  1 tablet Oral Daily   amLODipine  2.5 mg Oral Daily   Chlorhexidine Gluconate Cloth  6 each Topical Daily   guaiFENesin  5 mL Oral TID   pantoprazole (PROTONIX) IV  40 mg Intravenous Q12H   Continuous Infusions:  sodium chloride Stopped (03/01/21 0627)   cefTRIAXone (ROCEPHIN)  IV Stopped (03/01/21 0541)   heparin 2,050 Units/hr (03/01/21 1000)     LOS: 3 days    Time spent: 40 minutes    Ramiro Harvest, MD [facility]   To  contact the attending provider between 7A-7P or the covering provider during after hours 7P-7A, please log into the web site [email or web address] and access using universal [place] password for that web site. If you do not have the password, please call the hospital operator.  03/01/2021, 10:46 AM

## 2021-03-01 NOTE — Progress Notes (Signed)
03/01/2021 Patient requesting ear drops for left ear, states that he has had problems with dry ear following prior herpes zoster infection. Will pass request along to day shift staff for f/u with MD. Lesly Rubenstein. Clelia Croft BSN, RN, CCRP 03/01/2021 6:30 AM

## 2021-03-01 NOTE — Plan of Care (Signed)

## 2021-03-01 NOTE — Progress Notes (Signed)
ANTICOAGULATION CONSULT NOTE - Follow Up Consult  Pharmacy Consult for IV heparin Indication: pulmonary embolus  No Known Allergies  Patient Measurements: Height: 6\' 1"  (185.4 cm) Weight: 110.5 kg (243 lb 9.7 oz) IBW/kg (Calculated) : 79.9 Heparin Dosing Weight: 103 kg   Vital Signs: Temp: 98.8 F (37.1 C) (11/27 0430) Temp Source: Oral (11/27 0430) BP: 140/87 (11/27 0600) Pulse Rate: 90 (11/27 0000)  Labs: Recent Labs    02/27/21 0646 02/27/21 1545 02/28/21 0259 02/28/21 1506 02/28/21 2127 03/01/21 0527  HGB 7.7* 7.4* 8.0* 7.8*  --  7.7*  HCT 24.8* 23.9* 26.3*  --   --  25.8*  PLT 470* 394 369  --   --  359  HEPARINUNFRC  --   --   --   --  <0.10* <0.10*  CREATININE 1.13  --  0.98  --   --  1.17     Estimated Creatinine Clearance: 95.1 mL/min (by C-G formula based on SCr of 1.17 mg/dL).   Medications:  Scheduled:   abacavir-dolutegravir-lamiVUDine  1 tablet Oral Daily   Chlorhexidine Gluconate Cloth  6 each Topical Daily   guaiFENesin  5 mL Oral TID   pantoprazole (PROTONIX) IV  40 mg Intravenous Q12H    Assessment: Pharmacy is consulted to dose heparin in 55 yo male diagnosed with PE with right heart strain. Pt not initially started on anticoagulation as Hgb was 4.4. Pt has received several transfusions during admission. IV filter placed on 11/25   Per discussion with MD, would like to avoid boluses and aim for lower heparin target range on 0.3 to 0.5 due to recent   bleeds   Today, 02/28/21  Hgb 7.7 Plt wnl Scr 1.17 mg/dl, CrCl ~ 95 ml/min  Heparin level < 0.1 with heparin gtt @ 1800 units/hr No interruptions in therapy and no line issues reported by RN.  No bleeding issues reported by RN.   Goal of Therapy:  Heparin level 0.3-0.5 units/ml Monitor platelets by anticoagulation protocol: Yes   Plan:  No bolus as per consult  Increase Heparin to 2050 units/hr  Obtain HL 6 hours after infusion rate increase  Daily CBC while  on heparin Monitor for  signs and symptoms of bleeding  03/02/21, PharmD 03/01/2021 6:27 AM

## 2021-03-01 NOTE — Progress Notes (Signed)
ANTICOAGULATION CONSULT NOTE - Follow Up Consult  Pharmacy Consult for IV heparin Indication: pulmonary embolus  No Known Allergies  Patient Measurements: Height: 6\' 1"  (185.4 cm) Weight: 110.5 kg (243 lb 9.7 oz) IBW/kg (Calculated) : 79.9 Heparin Dosing Weight: 103 kg   Vital Signs: Temp: 99.1 F (37.3 C) (11/27 1900) Temp Source: Oral (11/27 1900) BP: 148/102 (11/27 2035) Pulse Rate: 111 (11/27 2035)  Labs: Recent Labs    02/27/21 0646 02/27/21 1545 02/28/21 0259 02/28/21 1506 02/28/21 2127 03/01/21 0527 03/01/21 1244 03/01/21 2130  HGB 7.7* 7.4* 8.0* 7.8*  --  7.7*  --   --   HCT 24.8* 23.9* 26.3*  --   --  25.8*  --   --   PLT 470* 394 369  --   --  359  --   --   HEPARINUNFRC  --   --   --   --    < > <0.10* <0.10* <0.10*  CREATININE 1.13  --  0.98  --   --  1.17  --   --    < > = values in this interval not displayed.     Estimated Creatinine Clearance: 95.1 mL/min (by C-G formula based on SCr of 1.17 mg/dL).   Medications:  Scheduled:   abacavir-dolutegravir-lamiVUDine  1 tablet Oral Daily   [START ON 03/02/2021] amLODipine  5 mg Oral Daily   Chlorhexidine Gluconate Cloth  6 each Topical Daily   guaiFENesin  5 mL Oral TID   pantoprazole (PROTONIX) IV  40 mg Intravenous Q12H    Assessment: Pharmacy is consulted to dose heparin in 55 yo male diagnosed with PE with right heart strain. Pt not initially started on anticoagulation as Hgb was 4.4. Pt has received several transfusions during admission. IV filter placed on 11/25   Per discussion with MD, would like to avoid boluses and aim for lower heparin target range on 0.3 to 0.5 due to recent   bleeds   Today, 02/28/21  Hgb 7.7 Plt wnl Scr 1.17 mg/dl, CrCl ~ 95 ml/min  Heparin level < 0.1 with heparin gtt @ 1800 units/hr No interruptions in therapy and no line issues reported by RN.  No bleeding issues reported by RN.  PM HL < 0.10, subtherapeutic  No line or bleeding issues per RN  Spoke to Dr.  03/02/21 about repeated levels being low while there is an increased bleeding risk if using other anticoagulants. Continue IV heparin for now   3rd shift f/u: HL < 0.1 (subtherapeutic) No interruptions in therapy and no line issues reported by RN.  No bleeding issues reported by RN.  Goal of Therapy:  Heparin level 0.3-0.5 units/ml Monitor platelets by anticoagulation protocol: Yes   Plan:  No bolus as per consult  Increase Heparin to 2550 units/hr  Obtain HL 6 hours after infusion rate increase  Daily CBC while  on heparin Monitor for signs and symptoms of bleeding   Janee Morn, PharmD 03/01/2021 11:12 PM

## 2021-03-01 NOTE — Progress Notes (Signed)
1 Day Post-Op Subjective: Denies abdominal pain, flank pain. Voiding clear yellow urine without difficulty.  Objective: Vital signs in last 24 hours: Temp:  [97.8 F (36.6 C)-99.6 F (37.6 C)] 98.3 F (36.8 C) (11/27 0825) Pulse Rate:  [90-105] 90 (11/27 0000) Resp:  [20-48] 29 (11/27 1000) BP: (109-158)/(77-96) 158/90 (11/27 1000) SpO2:  [96 %-100 %] 100 % (11/27 0700) Weight:  [110.5 kg] 110.5 kg (11/27 0430)  Intake/Output from previous day: 11/26 0701 - 11/27 0700 In: 2268.1 [P.O.:480; I.V.:1341; IV Piggyback:447.1] Out: 1000 [Urine:900; Stool:100] Intake/Output this shift: Total I/O In: 204.9 [P.O.:120; I.V.:84.9] Out: -   Physical Exam:  General: Alert and oriented CV: RRR Lungs: Clear Abdomen: Soft, ND, NT Ext: NT, No erythema  Lab Results: Recent Labs    02/27/21 1545 02/28/21 0259 02/28/21 1506 03/01/21 0527  HGB 7.4* 8.0* 7.8* 7.7*  HCT 23.9* 26.3*  --  25.8*   BMET Recent Labs    02/28/21 0259 03/01/21 0527  NA 133* 133*  K 4.7 4.6  CL 109 109  CO2 17* 18*  GLUCOSE 115* 106*  BUN 13 14  CREATININE 0.98 1.17  CALCIUM 8.6* 8.6*     Studies/Results: IR IVC FILTER PLMT / S&I /IMG GUID/MOD SED  Result Date: 02/27/2021 INDICATION: Acute PE.  GI bleed, contraindicating anticoagulation EXAM: ULTRASOUND GUIDANCE FOR VASCULAR ACCESS IVC CATHETERIZATION AND VENOGRAM INFERIOR VENA CAVA FILTER PLACEMENT MEDICATIONS: None. ANESTHESIA/SEDATION: Fentanyl 2 mcg IV; Versed 100 mg IV Sedation Time: 23 minutes; The patient was continuously monitored during the procedure by the interventional radiology nurse under my direct supervision. CONTRAST:  45 mL Omnipaque 300 FLUOROSCOPY TIME:  1 minutes 42 seconds (57 mGy) COMPLICATIONS: None immediate. PROCEDURE: Informed written consent was obtained from the patient following explanation of the procedure, risks, benefits and alternatives. A time out was performed prior to the initiation of the procedure. Maximal barrier  sterile technique utilized including caps, mask, sterile gowns, sterile gloves, large sterile drape, hand hygiene, and Betadine prep. Under sterile condition and local anesthesia, right common femoral venous access was performed with ultrasound. An ultrasound image was saved and sent to PACS. Over a guidewire, the IVC filter delivery sheath and inner dilator were advanced into the IVC just above the IVC bifurcation. Contrast injection was performed for an IVC venogram. Through the delivery sheath, a retrievable Denali IVC filter was deployed below the level of the renal veins and above the IVC bifurcation. Limited post deployment venacavagram was performed. The delivery sheath was removed and hemostasis was obtained with manual compression. A dressing was placed. The patient tolerated the procedure well without immediate post procedural complication. FINDINGS: The IVC is patent. No evidence of thrombus, stenosis, or occlusion. No variant venous anatomy. Successful placement of the IVC filter below the level of the renal veins. IMPRESSION: Successful placement of a retrievable infrarenal IVC filter, as above. PLAN: IVC filters can cause complications when left in place for extended periods of time. If medically appropriate, recommend discontinuing filter prior to discharge. Please re-evaluate the patient for filter discontinuation when they are seen in follow up, and refer patient to Interventional Radiology for removal. Michaelle Birks, MD [facility] Electronically Signed   By: Michaelle Birks M.D.   On: 02/27/2021 18:57   US RENAL  Result Date: 02/28/2021 CLINICAL DATA:  Gross hematuria, history HIV, hypertension EXAM: RENAL / URINARY TRACT ULTRASOUND COMPLETE COMPARISON:  CT abdomen and pelvis 10/25/2017 FINDINGS: Right Kidney: Renal measurements: 12.7 x 6.5 x 6.6  cm = volume: 284 mL. Normal cortical thickness. Upper normal cortical echogenicity. No mass,  hydronephrosis, or shadowing calcification. Left Kidney: Renal measurements: 12.4 x 7.4 x 7.1 cm = volume: 340 mL. Normal cortical thickness. Upper normal cortical echogenicity. No mass, hydronephrosis, or shadowing calcification. Bladder: Appears normal for degree of bladder distention. Other: N/A IMPRESSION: No renal sonographic abnormalities identified. Electronically Signed   By: Ulyses Southward M.D.   On: 02/28/2021 15:44    Assessment/Plan: Anemia: Hgb 4 on admission. S/p 4u pRBC with hgb at 8 at consultation. Unremarkable EGD and colonoscopy 02/28/21 History of gross hematuria: Per patient, intermittent over the past 2 months, not associated with clots. Resolved for previous 2 weeks prior to admission. Currently voiding clear yellow urine.  Bilateral pulmonary embolism Pulmonary infiltrate, treating for possible CAP HIV  -Given recent history of gross hematuria, I ordered a CT hematuria protocol to further evaluate.  Unfortunately, he was unable to lay flat for the exam saying that he had chest pain with doing so.  Of note, he was able to tolerate a chest CT on 11/24. -I did order a renal bladder ultrasound that resulted yesterday that did not reveal any concerning findings.  There were no renal masses, hydronephrosis or abnormalities seen in the bladder. -I discussed with the patient that he will ultimately require cross-sectional imaging with CT hematuria protocol and to adequately evaluate his history of gross hematuria.  I did reassure him that this does not appear to be any large bladder mass on the renal ultrasound. -He would like to attempt to repeat CT hematuria protocol tomorrow. -He will also require an outpatient cystoscopy.  I placed follow-up in the chart. -Followup urine cytology -Following   LOS: 3 days   Matt R. Imogen Maddalena MD 03/01/2021, 1:03 PM [facility]  Pager: [phone]

## 2021-03-01 NOTE — Progress Notes (Signed)
ANTICOAGULATION CONSULT NOTE - Follow Up Consult  Pharmacy Consult for IV heparin Indication: pulmonary embolus  No Known Allergies  Patient Measurements: Height: 6\' 1"  (185.4 cm) Weight: 110.5 kg (243 lb 9.7 oz) IBW/kg (Calculated) : 79.9 Heparin Dosing Weight: 103 kg   Vital Signs: Temp: 99.5 F (37.5 C) (11/27 1308) Temp Source: Oral (11/27 1308) BP: 165/123 (11/27 1400)  Labs: Recent Labs    02/27/21 0646 02/27/21 1545 02/28/21 0259 02/28/21 1506 02/28/21 2127 03/01/21 0527 03/01/21 1244  HGB 7.7* 7.4* 8.0* 7.8*  --  7.7*  --   HCT 24.8* 23.9* 26.3*  --   --  25.8*  --   PLT 470* 394 369  --   --  359  --   HEPARINUNFRC  --   --   --   --  <0.10* <0.10* <0.10*  CREATININE 1.13  --  0.98  --   --  1.17  --      Estimated Creatinine Clearance: 95.1 mL/min (by C-G formula based on SCr of 1.17 mg/dL).   Medications:  Scheduled:   abacavir-dolutegravir-lamiVUDine  1 tablet Oral Daily   amLODipine  2.5 mg Oral Daily   Chlorhexidine Gluconate Cloth  6 each Topical Daily   guaiFENesin  5 mL Oral TID   pantoprazole (PROTONIX) IV  40 mg Intravenous Q12H    Assessment: Pharmacy is consulted to dose heparin in 55 yo male diagnosed with PE with right heart strain. Pt not initially started on anticoagulation as Hgb was 4.4. Pt has received several transfusions during admission. IV filter placed on 11/25   Per discussion with MD, would like to avoid boluses and aim for lower heparin target range on 0.3 to 0.5 due to recent   bleeds   Today, 02/28/21  Hgb 7.7 Plt wnl Scr 1.17 mg/dl, CrCl ~ 95 ml/min  Heparin level < 0.1 with heparin gtt @ 1800 units/hr No interruptions in therapy and no line issues reported by RN.  No bleeding issues reported by RN.  PM HL < 0.10, subtherapeutic  No line or bleeding issues per RN  Spoke to Dr. 03/02/21 about repeated levels being low while there is an increased bleeding risk if using other anticoagulants. Continue IV heparin for  now    Goal of Therapy:  Heparin level 0.3-0.5 units/ml Monitor platelets by anticoagulation protocol: Yes   Plan:  No bolus as per consult  Increase Heparin to 2300 units/hr  Obtain HL 6 hours after infusion rate increase  Daily CBC while  on heparin Monitor for signs and symptoms of bleeding   Janee Morn, PharmD, BCPS 03/01/2021 2:33 PM

## 2021-03-02 ENCOUNTER — Inpatient Hospital Stay (HOSPITAL_COMMUNITY): Payer: Medicare Other

## 2021-03-02 ENCOUNTER — Encounter (HOSPITAL_COMMUNITY): Payer: Self-pay | Admitting: Pulmonary Disease

## 2021-03-02 LAB — HEPARIN LEVEL (UNFRACTIONATED)
Heparin Unfractionated: <0.1 IU/mL — ABNORMAL LOW (ref 0.30–0.70)
Heparin Unfractionated: 0.11 IU/mL — ABNORMAL LOW (ref 0.30–0.70)

## 2021-03-02 LAB — BASIC METABOLIC PANEL
Anion gap: 8 (ref 5–15)
BUN: 13 mg/dL (ref 6–20)
CO2: 18 mmol/L — ABNORMAL LOW (ref 22–32)
Calcium: 8.7 mg/dL — ABNORMAL LOW (ref 8.9–10.3)
Chloride: 108 mmol/L (ref 98–111)
Creatinine, Ser: 1 mg/dL (ref 0.61–1.24)
GFR, Estimated: >60 mL/min (ref >60)
Glucose, Bld: 103 mg/dL — ABNORMAL HIGH (ref 70–99)
Potassium: 4.3 mmol/L (ref 3.5–5.1)
Sodium: 134 mmol/L — ABNORMAL LOW (ref 135–145)

## 2021-03-02 LAB — CBC
HCT: 26 % — ABNORMAL LOW (ref 39.0–52.0)
Hemoglobin: 8.1 g/dL — ABNORMAL LOW (ref 13.0–17.0)
MCH: 28.7 pg (ref 26.0–34.0)
MCHC: 31.2 g/dL (ref 30.0–36.0)
MCV: 92.2 fL (ref 80.0–100.0)
Platelets: 367 10*3/uL (ref 150–400)
RBC: 2.82 MIL/uL — ABNORMAL LOW (ref 4.22–5.81)
RDW: 17.7 % — ABNORMAL HIGH (ref 11.5–15.5)
WBC: 13.8 10*3/uL — ABNORMAL HIGH (ref 4.0–10.5)
nRBC: 0 % (ref 0.0–0.2)

## 2021-03-02 LAB — SAVE SMEAR(SSMR), FOR PROVIDER SLIDE REVIEW

## 2021-03-02 MED ORDER — SODIUM CHLORIDE 0.9 % IV SOLN
INTRAVENOUS | Status: AC
  Filled 2021-03-02: qty 250

## 2021-03-02 MED ORDER — ENOXAPARIN SODIUM 120 MG/0.8ML IJ SOSY
110.0000 mg | PREFILLED_SYRINGE | Freq: Two times a day (BID) | INTRAMUSCULAR | Status: DC
  Administered 2021-03-02 – 2021-03-04 (×4): 110 mg via SUBCUTANEOUS
  Filled 2021-03-02 (×4): qty 0.74

## 2021-03-02 MED ORDER — MORPHINE SULFATE (PF) 2 MG/ML IV SOLN
1.0000 mg | Freq: Once | INTRAVENOUS | Status: AC
  Administered 2021-03-02: 11:00:00 1 mg via INTRAVENOUS
  Filled 2021-03-02: qty 1

## 2021-03-02 MED ORDER — IOHEXOL 350 MG/ML SOLN
100.0000 mL | Freq: Once | INTRAVENOUS | Status: AC | PRN
  Administered 2021-03-02: 12:00:00 100 mL via INTRAVENOUS

## 2021-03-02 MED ORDER — SODIUM BICARBONATE 650 MG PO TABS
650.0000 mg | ORAL_TABLET | Freq: Two times a day (BID) | ORAL | Status: AC
  Administered 2021-03-02 – 2021-03-04 (×4): 650 mg via ORAL
  Filled 2021-03-02 (×5): qty 1

## 2021-03-02 MED ORDER — HEPARIN (PORCINE) 25000 UT/250ML-% IV SOLN
2850.0000 [IU]/h | INTRAVENOUS | Status: AC
  Administered 2021-03-02: 15:00:00 2850 [IU]/h via INTRAVENOUS

## 2021-03-02 NOTE — Progress Notes (Signed)
OT Cancellation Note  Patient Details Name: Leon Nichols MRN: [id number] DOB: September 12, 1965   Cancelled Treatment:    Reason Eval/Treat Not Completed: Medical issues which prohibited therapy  Patient was noted to have DVTs with Heparin level not therapeutic at this time. OT to hold off until Heparin is at therapeutic level.   Sharyn Blitz OTR/L, MS Acute Rehabilitation Department Office# [phone] Pager# [phone]   03/02/2021, 12:22 PM

## 2021-03-02 NOTE — Progress Notes (Signed)
[facility] Gastroenterology Progress Note  Leon Nichols 55 y.o. 1966/01/08  CC:  Anemia   Subjective: Patient states he is doing okay today. Denies melena/hematochezia. Denies abdominal pain. Denies nausea/vomiting. Voiding yellow urine. Bowel movements are normal.  ROS : Review of Systems  Gastrointestinal:  Negative for abdominal pain, blood in stool, constipation, diarrhea, heartburn, melena, nausea and vomiting.  Genitourinary:  Negative for dysuria and urgency.     Objective: Vital signs in last 24 hours: Vitals:   03/02/21 0600 03/02/21 0800  BP: (!) 144/91 (!) 141/93  Pulse:    Resp: 17 (!) 38  Temp:  98 F (36.7 C)  SpO2: 97%     Physical Exam:  General:  Alert, cooperative, no distress  Head:  Normocephalic, without obvious abnormality, atraumatic  Eyes:  Anicteric sclera, EOM's intact, conjunctival pallor  Lungs:   Clear to auscultation bilaterally, respirations unlabored  Heart:  Regular rate and rhythm, S1, S2 normal  Abdomen:   Soft, non-tender, bowel sounds active all four quadrants,  no masses,     Lab Results: Recent Labs    03/01/21 0527 03/02/21 0550  NA 133* 134*  K 4.6 4.3  CL 109 108  CO2 18* 18*  GLUCOSE 106* 103*  BUN 14 13  CREATININE 1.17 1.00  CALCIUM 8.6* 8.7*   Recent Labs    02/28/21 0259  AST 11*  ALT 6  ALKPHOS 98  BILITOT 1.2  PROT 8.0  ALBUMIN 2.3*   Recent Labs    03/01/21 0527 03/02/21 0550  WBC 13.7* 13.8*  HGB 7.7* 8.1*  HCT 25.8* 26.0*  MCV 93.5 92.2  PLT 359 367   No results for input(s): LABPROT, INR in the last 72 hours.    Assessment Anemia - EGD 11/26: normal esophagus, stomach, and duodenal b ulb. No source of anemia on endoscopy. - Colonoscopy 11/26 unable to be performed due to poor prep. - HGB 8.1 (7.7 yesterday) - Status post transfusion 4 units packed red blood cells during the hospitalization - Anemia panel consistent with anemia of chronic disease with iron level of 16, TIBC of 159,  ferritin of 1499.  Vitamin B12 480. - BUN 13, Cr 1.00  Colostomy - hx of perianal/rectal abscess, loop colostomy in 2013 that has not been taken down.  Bilateral PEs  Hematuria  Possible CAP - Leukocytosis WBC 13.8  Plan: No need for colonoscopy at this time. Not iron deficient, no obvious melena/hematochezia and negative EGD. Recommend he follow up outpatient with surgical team and GI for outpatient colonoscopy prior to ostomy takedown. [facility] will sign off. Please contact us if we can be of any further assistance during this hospital stay.       Akili Cuda Leanna Sato PA-C 03/02/2021, 10:04 AM  Contact #  [phone]

## 2021-03-02 NOTE — Progress Notes (Signed)
PROGRESS NOTE    Leon Nichols  [id number] DOB: February 15, 1966 DOA: 02/25/2021 PCP: Pcp, No   Chief Complaint  Patient presents with   Shortness of Breath    Brief Narrative:  Hx HIV, HTN, EtOH, colostomy, presenting on 11/23 with lower extremity swelling and dyspnea and pleuritic right chest pain. Here found to have b/l PEs with reduced RV function on TTE, also hemoglobin in 4s from normal in February of this year. No report of melena or hematochezia but does report one month of intermittent gross blood in urine for about a month.  Patient transfused a total of 4 units packed red blood cells.  GI consulted and patient underwent upper endoscopy with no source of bleeding noted, colonoscopy was attempted but due to poor prep may need to be repeated.  Patient seen by urology due to complaints of hematuria and work-up underway.   Assessment & Plan:   Principal Problem:   Acute pulmonary embolism (HCC) Active Problems:   Anemia   HIV disease ([place])   Acute kidney injury ([place])   Acute DVT (deep venous thrombosis) (HCC)   Occult blood in stools   Pyuria, sterile   Acute pulmonary embolism without acute cor pulmonale (HCC)   Gross hematuria   Symptomatic anemia   Hypertension   Community acquired pneumonia  #1 acute hypoxic respiratory failure/bilateral PE -Patient presented with a symptomatic acute bilateral PE, CT chest done concerning for right ventricular strain -2D echo done also with concerns for right ventricular strain. -Lower extremity Dopplers negative for DVT. -IVC filter placed 02/27/2021 per IR -Case discussed by Dr. Si Raider with vascular surgery who advised starting anticoagulation which was ordered with no bolus and aiming for the low end of therapeutic window, if further bleeding will likely need to discontinue anticoagulation. -Dr. Si Raider, discussed with IR and was wondering whether thrombectomy or lysis may be helpful however per IR due to location of PE not suitable for  thrombectomy/lysis. -O2 being weaned down with sats of 97% on 2 L. -Per pharmacy heparin level not therapeutic at this time and being adjusted for. -If CT renal stone protocol unremarkable, and no further bleeding occurs and procedures completed if heparin level still not therapeutic may consider transitioning to Lovenox. -Supportive care.  2.  Hematuria -Patient with complaints of intermittent gross hematuria over the past month with no history of urinary malignancy. -Urinalysis with no gross hematuria, urine cultures negative. -With clear yellow urine. -Urology consulted and following and advised CT renal stone protocol/urogram however patient unable to lay flat and as such test postponed and patient willing to try today.   -We will give a one-time dose of IV morphine prior to CT as per RN patient with complaints of significant back pain when trying to lay flat for CT. -Renal ultrasound with no acute abnormalities, negative for hydronephrosis. -Urology following and appreciate input and recommendations.  3.  Symptomatic anemia/FOBT -On presentation patient noted to have hemoglobin in the 4 which was normal 05/2020. -Patient denied any overt melena or hematochezia however positive guaiac. -Patient did endorse some intermittent hematuria. -GI consulted, patient underwent upper endoscopy 02/28/2021 with no source of bleeding noted. -Colonoscopy done 02/28/2021 with poor prep and unrevealing, GI to reassess and tentatively plan for repeat colonoscopy tomorrow. -GI recommended full liquid diet which we will continue for now. -Status post transfusion 4 units packed red blood cells during the hospitalization. -Hemoglobin stable at 8.1. -LDH noted to be low haptoglobin elevated on 02/2023 with low suspicion for hemolysis. -  Anemia panel consistent with anemia of chronic disease with iron level of 16, TIBC of 159, ferritin of 1499.  Vitamin B12 480. -Continue IV PPI every 12 hours -GI  following. -Transfusing for total hemoglobin < 7.  4.  HIV -Continue triumeq. -Compliant with medications, outpatient follow-up with ID.  5.  Hypertension -Improved on current regimen of Norvasc 5 mg daily.  6.  Pulmonary infiltrate/Probable CAP -Concern for pneumonia noted on CT chest and chest x-ray. -Blood cultures pending with no growth to date. -MRSA PCR negative. -Urine Legionella antigen negative, urine pneumococcus antigen negative. -Status post 3 days azithromycin. -Continue IV Rocephin and treat for total of 7 days..   DVT prophylaxis: Heparin Code Status: Full Family Communication: Updated patient.  No family at bedside. Disposition:   Status is: Inpatient  Remains inpatient appropriate because: Severity of illness       Consultants:  Interventional radiology Urology: Dr. Abner Greenspan 02/28/2021 Gastroenterology: Dr. Alessandra Bevels 02/26/2021 PCCM admission: Dr.Jeong   Procedures:  CT angiogram chest 02/26/2021 Chest x-ray 02/25/2021 Ultrasound-guided for vascular access/IVC catheterization and venogram/IVC filter placement--per IR, Dr.Mugweru 02/27/2021 Renal ultrasound 02/28/2021 2D echo 02/26/2021 Lower extremity Dopplers 02/26/2021 CT hematuria work-up pending Colonoscopy 02/28/2021-Per Dr. Paulita Fujita  Colonoscopy 02/28/2021-Per Dr. Paulita Fujita Transfuse 4 units packed red blood cells 02/26/2021   Antimicrobials: IV Rocephin 02/27/2021>>>>> IV azithromycin 02/26/2021>>>>> 02/28/2021   Subjective: Patient laying in bed.  Denies any bleeding.  No chest pain.  Feels some improvement with shortness of breath.  Willing to try CT renal stone protocol today.  Tolerating diet.    Objective: Vitals:   03/02/21 0458 03/02/21 0500 03/02/21 0600 03/02/21 0800  BP:  (!) 146/93 (!) 144/91 (!) 141/93  Pulse:      Resp: (!) 34 (!) 33 17 (!) 38  Temp:    98 F (36.7 C)  TempSrc:    Oral  SpO2:  98% 97%   Weight:  110.2 kg    Height:        Intake/Output Summary (Last  24 hours) at 03/02/2021 1006 Last data filed at 03/02/2021 0600 Gross per 24 hour  Intake 1018.07 ml  Output 926 ml  Net 92.07 ml    Filed Weights   02/28/21 0848 03/01/21 0430 03/02/21 0500  Weight: 104.3 kg 110.5 kg 110.2 kg    Examination:  General exam: Appears calm and comfortable  Respiratory system: Decreased breath sounds in the bases.  Some shallow breaths.  Speaking in full sentences.  Cardiovascular system: Tachycardia.  No JVD, no murmurs rubs or gallops.  No lower extremity edema. Gastrointestinal system: Abdomen is soft, nontender, nondistended, positive bowel sounds.  Ostomy bag intact.  Central nervous system: Alert and oriented. No focal neurological deficits. Extremities: Symmetric 5 x 5 power. Skin: No rashes, lesions or ulcers Psychiatry: Judgement and insight appear normal. Mood & affect appropriate.     Data Reviewed: I have personally reviewed following labs and imaging studies  CBC: Recent Labs  Lab 02/25/21 2349 02/26/21 0524 02/27/21 0426 02/27/21 0646 02/27/21 1545 02/28/21 0259 02/28/21 1506 03/01/21 0527 03/02/21 0550  WBC 11.2*   < > 9.9 13.3* 13.9* 16.1*  --  13.7* 13.8*  NEUTROABS 7.5  --  7.0 10.3*  --   --   --   --   --   HGB 4.2*   < > 11.5* 7.7* 7.4* 8.0* 7.8* 7.7* 8.1*  HCT 14.5*   < > 40.1 24.8* 23.9* 26.3*  --  25.8* 26.0*  MCV 91.8   < >  98.3 91.9 92.3 93.6  --  93.5 92.2  PLT 413*   < > 296 470* 394 369  --  359 367   < > = values in this interval not displayed.     Basic Metabolic Panel: Recent Labs  Lab 02/26/21 0524 02/27/21 0248 02/27/21 0646 02/28/21 0259 03/01/21 0527 03/02/21 0550  NA 133* 134* 134* 133* 133* 134*  K 5.2* 5.1 4.9 4.7 4.6 4.3  CL 106 111 110 109 109 108  CO2 17* 17* 16* 17* 18* 18*  GLUCOSE 110* 105* 104* 115* 106* 103*  BUN 42* 24* 21* 13 14 13   CREATININE 1.89* 1.24 1.13 0.98 1.17 1.00  CALCIUM 8.1* 8.3* 8.5* 8.6* 8.6* 8.7*  MG 2.5*  --   --   --   --   --      GFR: Estimated  Creatinine Clearance: 111.2 mL/min (by C-G formula based on SCr of 1 mg/dL).  Liver Function Tests: Recent Labs  Lab 02/25/21 2349 02/28/21 0259  AST 12* 11*  ALT 7 6  ALKPHOS 84 98  BILITOT 0.5 1.2  PROT 8.1 8.0  ALBUMIN 2.4* 2.3*     CBG: No results for input(s): GLUCAP in the last 168 hours.   Recent Results (from the past 240 hour(s))  Resp Panel by RT-PCR (Flu A&B, Covid) Nasopharyngeal Swab     Status: None   Collection Time: 02/26/21 12:32 AM   Specimen: Nasopharyngeal Swab; Nasopharyngeal(NP) swabs in vial transport medium  Result Value Ref Range Status   SARS Coronavirus 2 by RT PCR NEGATIVE NEGATIVE Final    Comment: (NOTE) SARS-CoV-2 target nucleic acids are NOT DETECTED.  The SARS-CoV-2 RNA is generally detectable in upper respiratory specimens during the acute phase of infection. The lowest concentration of SARS-CoV-2 viral copies this assay can detect is 138 copies/mL. A negative result does not preclude SARS-Cov-2 infection and should not be used as the sole basis for treatment or other patient management decisions. A negative result may occur with  improper specimen collection/handling, submission of specimen other than nasopharyngeal swab, presence of viral mutation(s) within the areas targeted by this assay, and inadequate number of viral copies(<138 copies/mL). A negative result must be combined with clinical observations, patient history, and epidemiological information. The expected result is Negative.  Fact Sheet for Patients:  EntrepreneurPulse.com.au  Fact Sheet for Healthcare Providers:  IncredibleEmployment.be  This test is no t yet approved or cleared by the [place] FDA and  has been authorized for detection and/or diagnosis of SARS-CoV-2 by FDA under an Emergency Use Authorization (EUA). This EUA will remain  in effect (meaning this test can be used) for the duration of the COVID-19 declaration  under Section 564(b)(1) of the Act, 21 U.S.C.section 360bbb-3(b)(1), unless the authorization is terminated  or revoked sooner.       Influenza A by PCR NEGATIVE NEGATIVE Final   Influenza B by PCR NEGATIVE NEGATIVE Final    Comment: (NOTE) The Xpert Xpress SARS-CoV-2/FLU/RSV plus assay is intended as an aid in the diagnosis of influenza from Nasopharyngeal swab specimens and should not be used as a sole basis for treatment. Nasal washings and aspirates are unacceptable for Xpert Xpress SARS-CoV-2/FLU/RSV testing.  Fact Sheet for Patients: EntrepreneurPulse.com.au  Fact Sheet for Healthcare Providers: IncredibleEmployment.be  This test is not yet approved or cleared by the [place] FDA and has been authorized for detection and/or diagnosis of SARS-CoV-2 by FDA under an Emergency Use Authorization (EUA). This EUA will remain in  effect (meaning this test can be used) for the duration of the COVID-19 declaration under Section 564(b)(1) of the Act, 21 U.S.C. section 360bbb-3(b)(1), unless the authorization is terminated or revoked.  Performed at [facility], [place] [address], [place], [place] [address]   Urine Culture     Status: None   Collection Time: 02/26/21  2:31 AM   Specimen: Urine, Clean Catch  Result Value Ref Range Status   Specimen Description   Final    URINE, CLEAN CATCH Performed at [facility], [place] [address], [place], [place] [address]    Special Requests   Final    Immunocompromised Performed at [facility], [place] [address] [place], [place] [address]    Culture   Final    NO GROWTH Performed at [facility], [place] [address] [place], [place] [address]    Report Status 02/27/2021 FINAL  Final  Culture, blood (routine x 2)     Status: None (Preliminary result)   Collection Time: 02/26/21  5:24 AM   Specimen: BLOOD  Result Value Ref Range Status    Specimen Description   Final    BLOOD LEFT ANTECUBITAL Performed at [place] [address], [place], [place] [address]    Special Requests   Final    BOTTLES DRAWN AEROBIC AND ANAEROBIC Blood Culture results may not be optimal due to an inadequate volume of blood received in culture bottles Performed at [place] [address] [place], [place] [address]    Culture   Final    NO GROWTH 4 DAYS Performed at [facility], [place] [address], [place], [place] [address]    Report Status PENDING  Incomplete  MRSA Next Gen by PCR, Nasal     Status: None   Collection Time: 02/26/21  5:24 AM   Specimen: Nasal Mucosa; Nasal Swab  Result Value Ref Range Status   MRSA by PCR Next Gen NOT DETECTED NOT DETECTED Final    Comment: (NOTE) The GeneXpert MRSA Assay (FDA approved for NASAL specimens only), is one component of a comprehensive MRSA colonization surveillance program. It is not intended to diagnose MRSA infection nor to guide or monitor treatment for MRSA infections. Test performance is not FDA approved in patients less than 62 years old. Performed at [facility], [place] [address], [place], [place] [address]   MRSA Next Gen by PCR, Nasal     Status: None   Collection Time: 02/26/21  9:11 AM   Specimen: Nasal Mucosa; Nasal Swab  Result Value Ref Range Status   MRSA by PCR Next Gen NOT DETECTED NOT DETECTED Final    Comment: (NOTE) The GeneXpert MRSA Assay (FDA approved for NASAL specimens only), is one component of a comprehensive MRSA colonization surveillance program. It is not intended to diagnose MRSA infection nor to guide or monitor treatment for MRSA infections. Test performance is not FDA approved in patients less than 36 years old. Performed at [facility], [place] [address] [place], [place] [address]   Culture, blood (routine x 2)     Status: None (Preliminary result)   Collection Time:  02/26/21 10:42 AM   Specimen: BLOOD LEFT FOREARM  Result Value Ref Range Status   Specimen Description   Final    BLOOD LEFT FOREARM Performed at [place] [address] [place], [place] [address]    Special Requests   Final  BOTTLES DRAWN AEROBIC ONLY Blood Culture adequate volume Performed at [facility], [address] [place], [place] [address]    Culture   Final    NO GROWTH 4 DAYS Performed at [facility], [address] [place], [place] [address]    Report Status PENDING  Incomplete          Radiology Studies: US RENAL  Result Date: 02/28/2021 CLINICAL DATA:  Gross hematuria, history HIV, hypertension EXAM: RENAL / URINARY TRACT ULTRASOUND COMPLETE COMPARISON:  CT abdomen and pelvis 10/25/2017 FINDINGS: Right Kidney: Renal measurements: 12.7 x 6.5 x 6.6 cm = volume: 284 mL. Normal cortical thickness. Upper normal cortical echogenicity. No mass, hydronephrosis, or shadowing calcification. Left Kidney: Renal measurements: 12.4 x 7.4 x 7.1 cm = volume: 340 mL. Normal cortical thickness. Upper normal cortical echogenicity. No mass, hydronephrosis, or shadowing calcification. Bladder: Appears normal for degree of bladder distention. Other: N/A IMPRESSION: No renal sonographic abnormalities identified. Electronically Signed   By: Ulyses Southward M.D.   On: 02/28/2021 15:44        Scheduled Meds:  abacavir-dolutegravir-lamiVUDine  1 tablet Oral Daily   amLODipine  5 mg Oral Daily   Chlorhexidine Gluconate Cloth  6 each Topical Daily   guaiFENesin  5 mL Oral TID   pantoprazole (PROTONIX) IV  40 mg Intravenous Q12H   sodium bicarbonate  650 mg Oral BID   Continuous Infusions:  sodium chloride Stopped (03/02/21 0550)   cefTRIAXone (ROCEPHIN)  IV Stopped (03/02/21 0535)   heparin 2,850 Units/hr (03/02/21 0933)     LOS: 4 days    Time spent: 40 minutes    Ramiro Harvest, MD [facility]   To contact the  attending provider between 7A-7P or the covering provider during after hours 7P-7A, please log into the web site [email or web address] and access using universal [place] password for that web site. If you do not have the password, please call the hospital operator.  03/02/2021, 10:06 AM

## 2021-03-02 NOTE — Progress Notes (Signed)
ANTICOAGULATION CONSULT NOTE - Follow Up Consult  Pharmacy Consult for IV heparin Indication: pulmonary embolus  No Known Allergies  Patient Measurements: Height: 6\' 1"  (185.4 cm) Weight: 110.2 kg (242 lb 15.2 oz) IBW/kg (Calculated) : 79.9 Heparin Dosing Weight: 103 kg   Vital Signs: Temp: 98.3 F (36.8 C) (11/28 0330) Temp Source: Oral (11/28 0330) BP: 141/93 (11/28 0800) Pulse Rate: 102 (11/28 0300)  Labs: Recent Labs    02/28/21 0259 02/28/21 1506 03/01/21 0527 03/01/21 1244 03/01/21 2130 03/02/21 0550  HGB 8.0*   < > 7.7*  --   --  8.1*  HCT 26.3*  --  25.8*  --   --  26.0*  PLT 369  --  359  --   --  367  HEPARINUNFRC  --    < > <0.10* <0.10* <0.10* <0.10*  CREATININE 0.98  --  1.17  --   --  1.00   < > = values in this interval not displayed.     Estimated Creatinine Clearance: 111.2 mL/min (by C-G formula based on SCr of 1 mg/dL).   Medications:  Scheduled:   abacavir-dolutegravir-lamiVUDine  1 tablet Oral Daily   amLODipine  5 mg Oral Daily   Chlorhexidine Gluconate Cloth  6 each Topical Daily   guaiFENesin  5 mL Oral TID   pantoprazole (PROTONIX) IV  40 mg Intravenous Q12H   sodium bicarbonate  650 mg Oral BID    Assessment: Pharmacy is consulted to dose heparin in 55 yo male diagnosed with PE with right heart strain. Pt not initially started on anticoagulation as Hgb was 4.4. Pt has received several transfusions during admission. IV filter placed on 11/25   Per discussion with MD, would like to avoid boluses and aim for lower heparin target range on 0.3 to 0.5 due to recent   bleeds   Today, 03/02/21 Hgb 8.1, low but stable, trending up  Plt wnl Scr 1.0 mg/dl, CrCl 03/04/21 ml/min  Heparin level < 0.1 with heparin gtt @ 2550 units/hr No interruptions in therapy and no line issues reported by RN.  No bleeding issues reported by RN. Spoke to Dr. >517 in regards to low levels. MD states that if  CT renal stone protocol unremarkable, and no further  bleeding occurs and procedures completed if heparin level still not therapeutic may consider transitioning to Lovenox.  Goal of Therapy:  Heparin level 0.3-0.5 units/ml Monitor platelets by anticoagulation protocol: Yes   Plan:  No bolus as per consult  Increase Heparin to 2850 units/hr  Obtain HL 6 hours after infusion rate increase  Daily CBC while  on heparin Monitor for signs and symptoms of bleeding    Janee Morn, PharmD, BCPS 03/02/2021 8:23 AM

## 2021-03-02 NOTE — Progress Notes (Signed)
2 Days Post-Op Subjective: Denies pain, no nausea or emesis. Voiding yellow urine.  Objective: Vital signs in last 24 hours: Temp:  [98.3 F (36.8 C)-99.5 F (37.5 C)] 98.3 F (36.8 C) (11/28 0330) Pulse Rate:  [102-115] 102 (11/28 0300) Resp:  [18-46] 33 (11/28 0500) BP: (127-174)/(86-123) 146/93 (11/28 0500) SpO2:  [89 %-100 %] 97 % (11/28 0400) Weight:  [110.2 kg] 110.2 kg (11/28 0500)  Intake/Output from previous day: 11/27 0701 - 11/28 0700 In: 1193.2 [P.O.:700; I.V.:493.2] Out: 925 [Urine:925] Intake/Output this shift: Total I/O In: 316.6 [P.O.:100; I.V.:216.6] Out: 575 [Urine:575]  Physical Exam:  General: Alert and oriented CV: RRR Lungs: Clear Abdomen: Soft, ND, NT Ext: NT, No erythema  Lab Results: Recent Labs    02/27/21 1545 02/28/21 0259 02/28/21 1506 03/01/21 0527  HGB 7.4* 8.0* 7.8* 7.7*  HCT 23.9* 26.3*  --  25.8*   BMET Recent Labs    02/28/21 0259 03/01/21 0527  NA 133* 133*  K 4.7 4.6  CL 109 109  CO2 17* 18*  GLUCOSE 115* 106*  BUN 13 14  CREATININE 0.98 1.17  CALCIUM 8.6* 8.6*     Studies/Results: US RENAL  Result Date: 02/28/2021 CLINICAL DATA:  Gross hematuria, history HIV, hypertension EXAM: RENAL / URINARY TRACT ULTRASOUND COMPLETE COMPARISON:  CT abdomen and pelvis 10/25/2017 FINDINGS: Right Kidney: Renal measurements: 12.7 x 6.5 x 6.6 cm = volume: 284 mL. Normal cortical thickness. Upper normal cortical echogenicity. No mass, hydronephrosis, or shadowing calcification. Left Kidney: Renal measurements: 12.4 x 7.4 x 7.1 cm = volume: 340 mL. Normal cortical thickness. Upper normal cortical echogenicity. No mass, hydronephrosis, or shadowing calcification. Bladder: Appears normal for degree of bladder distention. Other: N/A IMPRESSION: No renal sonographic abnormalities identified. Electronically Signed   By: Ulyses Southward M.D.   On: 02/28/2021 15:44    Assessment/Plan: Anemia: Hgb 4 on admission. S/p 4u pRBC with hgb at 8 at  consultation. Unremarkable EGD and colonoscopy 02/28/21 History of gross hematuria: Per patient, intermittent over the past 2 months, not associated with clots. Resolved for previous 2 weeks prior to admission. Currently voiding clear yellow urine.  Bilateral pulmonary embolism Pulmonary infiltrate, treating for possible CAP HIV  -Re-ordered CT hematuria. Discussed with patient. He would like to try for scan again and thinks that he will be able to tolerate laying flat. He understands importance of evaluating for source of hematuria. -RBUS 11/26 without obvious renal or bladder pathology -Urine cytology pending -Will require cystoscopy outpatient. Followup arranged.    LOS: 4 days   Matt R. Ayeisha Lindenberger MD 03/02/2021, 6:03 AM [facility]  Pager: [phone]

## 2021-03-02 NOTE — Progress Notes (Signed)
ANTICOAGULATION CONSULT NOTE - Follow Up Consult  Pharmacy Consult for IV heparin>>Lovenox Indication: pulmonary embolus  No Known Allergies  Patient Measurements: Height: 6\' 1"  (185.4 cm) Weight: 110.2 kg (242 lb 15.2 oz) IBW/kg (Calculated) : 79.9 Heparin Dosing Weight: 103 kg   Vital Signs: Temp: 98.2 F (36.8 C) (11/28 1200) Temp Source: Oral (11/28 1200) BP: 135/76 (11/28 1200)  Labs: Recent Labs    02/28/21 0259 02/28/21 1506 03/01/21 0527 03/01/21 1244 03/01/21 2130 03/02/21 0550  HGB 8.0*   < > 7.7*  --   --  8.1*  HCT 26.3*  --  25.8*  --   --  26.0*  PLT 369  --  359  --   --  367  HEPARINUNFRC  --    < > <0.10* <0.10* <0.10* <0.10*  CREATININE 0.98  --  1.17  --   --  1.00   < > = values in this interval not displayed.     Estimated Creatinine Clearance: 111.2 mL/min (by C-G formula based on SCr of 1 mg/dL).   Medications:  Scheduled:   abacavir-dolutegravir-lamiVUDine  1 tablet Oral Daily   amLODipine  5 mg Oral Daily   Chlorhexidine Gluconate Cloth  6 each Topical Daily   guaiFENesin  5 mL Oral TID   pantoprazole (PROTONIX) IV  40 mg Intravenous Q12H   sodium bicarbonate  650 mg Oral BID    Assessment: Pharmacy is consulted to dose heparin in 55 yo male diagnosed with PE with right heart strain. Pt not initially started on anticoagulation as Hgb was 4.4. Pt has received several transfusions during admission. IV filter placed on 11/25   Per discussion with MD, would like to avoid boluses and aim for lower heparin target range on 0.3 to 0.5 due to recent   bleeds   Today, 03/02/21 Hgb 8.1, low but stable, trending up  Plt wnl Scr 1.0 mg/dl, CrCl 03/04/21 ml/min  Heparin level < 0.1 with heparin gtt @ 2550 units/hr No interruptions in therapy and no line issues reported by RN.  No bleeding issues reported by RN. Spoke to Dr. >009 in regards to low levels. MD states that if  CT renal stone protocol unremarkable, and no further bleeding occurs and  procedures completed if heparin level still not therapeutic may consider transitioning to Lovenox. Addendum: CT study negative. To transition to lovenox  Goal of Therapy:  Heparin level 0.3-0.5 units/ml Monitor platelets by anticoagulation protocol: Yes   Plan:  DC heparin drip at 8 pm tonight Start Lovenox 1 mg/kg = 110 mg SQ q12h DC heparin levels Will continue daily CBC for now Monitor for signs and symptoms of bleeding   Janee Morn, Pharm.D [phone] 03/02/2021 4:09 PM

## 2021-03-02 NOTE — Progress Notes (Signed)
PT Cancellation Note  Patient Details Name: Leon Nichols MRN: [id number] DOB: [birth year]   Cancelled Treatment:    Reason Eval/Treat Not Completed: Medical issues which prohibited therapy. Order received. Chart reviewed. Pt on Heparin for LE DVTs + PE-heparin level not yet therapeutic. Per PT protocol, will hold PT eval at this time. Will check back as schedule allows.    Faye Ramsay, PT Acute Rehabilitation  Office: [phone] Pager: [phone]

## 2021-03-03 LAB — CBC
HCT: 26.1 % — ABNORMAL LOW (ref 39.0–52.0)
Hemoglobin: 7.7 g/dL — ABNORMAL LOW (ref 13.0–17.0)
MCH: 27.9 pg (ref 26.0–34.0)
MCHC: 29.5 g/dL — ABNORMAL LOW (ref 30.0–36.0)
MCV: 94.6 fL (ref 80.0–100.0)
Platelets: 355 10*3/uL (ref 150–400)
RBC: 2.76 MIL/uL — ABNORMAL LOW (ref 4.22–5.81)
RDW: 17.6 % — ABNORMAL HIGH (ref 11.5–15.5)
WBC: 12.2 10*3/uL — ABNORMAL HIGH (ref 4.0–10.5)
nRBC: 0 % (ref 0.0–0.2)

## 2021-03-03 LAB — BASIC METABOLIC PANEL
Anion gap: 7 (ref 5–15)
BUN: 12 mg/dL (ref 6–20)
CO2: 18 mmol/L — ABNORMAL LOW (ref 22–32)
Calcium: 8.8 mg/dL — ABNORMAL LOW (ref 8.9–10.3)
Chloride: 108 mmol/L (ref 98–111)
Creatinine, Ser: 0.93 mg/dL (ref 0.61–1.24)
GFR, Estimated: >60 mL/min (ref >60)
Glucose, Bld: 108 mg/dL — ABNORMAL HIGH (ref 70–99)
Potassium: 4 mmol/L (ref 3.5–5.1)
Sodium: 133 mmol/L — ABNORMAL LOW (ref 135–145)

## 2021-03-03 LAB — CULTURE, BLOOD (ROUTINE X 2)
Culture: NO GROWTH
Culture: NO GROWTH
Special Requests: ADEQUATE

## 2021-03-03 LAB — MAGNESIUM: Magnesium: 1.8 mg/dL (ref 1.7–2.4)

## 2021-03-03 MED ORDER — ALBUMIN HUMAN 25 % IV SOLN: 25.0000 g | Freq: Once | INTRAVENOUS | Status: AC

## 2021-03-03 MED ORDER — ALBUMIN HUMAN 25 % IV SOLN
INTRAVENOUS | Status: AC
  Filled 2021-03-03: qty 50

## 2021-03-03 MED ORDER — FUROSEMIDE 10 MG/ML IJ SOLN: 20.0000 mg | Freq: Once | INTRAMUSCULAR | Status: DC

## 2021-03-03 MED ORDER — ALBUMIN HUMAN 25 % IV SOLN
INTRAVENOUS | Status: AC
  Administered 2021-03-03: 25 g via INTRAVENOUS
  Filled 2021-03-03: qty 50

## 2021-03-03 MED ORDER — FUROSEMIDE 10 MG/ML IJ SOLN: 40.0000 mg | Freq: Once | INTRAMUSCULAR | Status: AC

## 2021-03-03 MED ORDER — FUROSEMIDE 10 MG/ML IJ SOLN
INTRAMUSCULAR | Status: AC
  Administered 2021-03-03: 40 mg via INTRAVENOUS
  Filled 2021-03-03: qty 4

## 2021-03-03 MED ORDER — PANTOPRAZOLE SODIUM 40 MG PO TBEC
40.0000 mg | DELAYED_RELEASE_TABLET | Freq: Every day | ORAL | Status: DC
  Administered 2021-03-03 – 2021-03-06 (×4): 40 mg via ORAL
  Filled 2021-03-03 (×3): qty 1

## 2021-03-03 MED ORDER — AMLODIPINE BESYLATE 10 MG PO TABS
10.0000 mg | ORAL_TABLET | Freq: Every day | ORAL | Status: DC
  Administered 2021-03-03 – 2021-03-06 (×4): 10 mg via ORAL
  Filled 2021-03-03 (×3): qty 1

## 2021-03-03 NOTE — Evaluation (Signed)
Occupational Therapy Evaluation Patient Details Name: Leon Nichols MRN: [id number] DOB: 1965/11/18 Today's Date: 03/03/2021   History of Present Illness 55 yo male presenting to ED on 11/24 with dyspnea, R lower chest pain, and BLE edema. CTA chest also showed right lower lobe consolidation and right pleural effusion in addition to the pulmonary emboli. Image showing BLE DVTs. S/p IVC filter placement on 11/25. S/p EGD and colonoscopy for GI bleed on 11/26. PMH including HIV, HTN, perianal abscess, perirectal abscess, R knee arthroscopy, and shingles.   Clinical Impression   PTA, pt was living alone and was independent; works in Set designer. Pt currently requiring Min A for LB ADLs and Min Guard-Min A for functional mobility. Pt presenting with decreased balance and activity tolerance. Pt would benefit from further acute OT to facilitate safe dc. Recommend dc to home once medically stable per physician.  SpO2 90s on RA during activity. HR 113. RR 30s     Recommendations for follow up therapy are one component of a multi-disciplinary discharge planning process, led by the attending physician.  Recommendations may be updated based on patient status, additional functional criteria and insurance authorization.   Follow Up Recommendations  No OT follow up    Assistance Recommended at Discharge Intermittent Supervision/Assistance  Functional Status Assessment  Patient has had a recent decline in their functional status and demonstrates the ability to make significant improvements in function in a reasonable and predictable amount of time.  Equipment Recommendations  None recommended by OT    Recommendations for Other Services PT consult     Precautions / Restrictions Precautions Precautions: Fall;Other (comment) Precaution Comments: Watch SPO2      Mobility Bed Mobility               General bed mobility comments: Sitting at EOB upon arrival     Transfers Overall transfer level: Needs assistance   Transfers: Sit to/from Stand Sit to Stand: Min guard           General transfer comment: Min Guard A for safety      Balance Overall balance assessment: Mild deficits observed, not formally tested                                         ADL either performed or assessed with clinical judgement   ADL Overall ADL's : Needs assistance/impaired Eating/Feeding: Set up;Sitting   Grooming: Oral care;Min guard;Standing   Upper Body Bathing: Set up;Supervision/ safety;Sitting   Lower Body Bathing: Minimal assistance;Sit to/from stand   Upper Body Dressing : Set up;Supervision/safety;Sitting   Lower Body Dressing: Minimal assistance;Sit to/from stand   Toilet Transfer: Min guard;Ambulation (simulated to recliner)           Functional mobility during ADLs: Min guard;Minimal assistance General ADL Comments: Pt with decreased balance, strength, and activity tolerance. Pt performing functional mobility to/from bathroom and completing oral care. One episode of LOB and requiring Min A to correct     Vision Baseline Vision/History: 1 Wears glasses Patient Visual Report: No change from baseline       Perception     Praxis      Pertinent Vitals/Pain Pain Assessment: Faces Faces Pain Scale: Hurts even more Pain Location: back and chest     Hand Dominance Right   Extremity/Trunk Assessment Upper Extremity Assessment Upper Extremity Assessment: Overall WFL for tasks assessed  Lower Extremity Assessment Lower Extremity Assessment: Defer to PT evaluation   Cervical / Trunk Assessment Cervical / Trunk Assessment: Normal   Communication Communication Communication: No difficulties   Cognition Arousal/Alertness: Awake/alert Behavior During Therapy: WFL for tasks assessed/performed Overall Cognitive Status: Within Functional Limits for tasks assessed                                  General Comments: Slight decrease awareness of deficits     General Comments  Pt wearing 4L O2 upon arrival and states it is "to make my chest feel better". SpO2 90s on RA during activity. Pt requesting O2 at end of session. HR 113. RR 30s    Exercises     Shoulder Instructions      Home Living Family/patient expects to be discharged to:: Private residence Living Arrangements: Alone Available Help at Discharge: Family;Available PRN/intermittently (Three daughters) Type of Home: Apartment Home Access: Stairs to enter   Entrance Stairs-Rails: Right Home Layout: One level     Bathroom Shower/Tub: Chief Strategy Officer: Standard     Home Equipment: None          Prior Functioning/Environment Prior Level of Function : Independent/Modified Independent             Mobility Comments: ADLs, IADLs, work, carpentry/brick Nature conservation officer Problem List: Decreased strength;Decreased activity tolerance;Decreased range of motion;Decreased safety awareness;Decreased knowledge of use of DME or AE;Decreased knowledge of precautions;Pain      OT Treatment/Interventions: Self-care/ADL training;Therapeutic exercise;Energy conservation;DME and/or AE instruction;Therapeutic activities;Patient/family education    OT Goals(Current goals can be found in the care plan section) Acute Rehab OT Goals Patient Stated Goal: Go home OT Goal Formulation: With patient Time For Goal Achievement: 03/17/21 Potential to Achieve Goals: Good  OT Frequency: Min 2X/week   Barriers to D/C:            Co-evaluation PT/OT/SLP Co-Evaluation/Treatment: Yes Reason for Co-Treatment: For patient/therapist safety;To address functional/ADL transfers   OT goals addressed during session: ADL's and self-care      AM-PAC OT "6 Clicks" Daily Activity     Outcome Measure Help from another person eating meals?: None Help from another person taking care of personal grooming?: A Little Help  from another person toileting, which includes using toliet, bedpan, or urinal?: A Little Help from another person bathing (including washing, rinsing, drying)?: A Little Help from another person to put on and taking off regular upper body clothing?: None Help from another person to put on and taking off regular lower body clothing?: A Little 6 Click Score: 20   End of Session Nurse Communication: Mobility status  Activity Tolerance: Patient tolerated treatment well Patient left: in chair;with call bell/phone within reach;with chair alarm set  OT Visit Diagnosis: Unsteadiness on feet (R26.81);Other abnormalities of gait and mobility (R26.89);Muscle weakness (generalized) (M62.81);Pain Pain - part of body:  (Back)                Time: 4742-5956 OT Time Calculation (min): 16 min Charges:  OT General Charges $OT Visit: 1 Visit OT Evaluation $OT Eval Moderate Complexity: 1 Mod  Abram Sax MSOT, OTR/L Acute Rehab Pager: [phone] Office: [phone]  Theodoro Grist Quintan Saldivar 03/03/2021, 3:35 PM

## 2021-03-03 NOTE — Progress Notes (Signed)
PROGRESS NOTE    Leon Nichols  [id number] DOB: [birth year] DOA: 02/25/2021 PCP: Pcp, No   Chief Complaint  Patient presents with   Shortness of Breath    Brief Narrative:  Hx HIV, HTN, EtOH, colostomy, presenting on 11/23 with lower extremity swelling and dyspnea and pleuritic right chest pain. Here found to have b/l PEs with reduced RV function on TTE, also hemoglobin in 4s from normal in February of this year. No report of melena or hematochezia but does report one month of intermittent gross blood in urine for about a month.  Patient transfused a total of 4 units packed red blood cells.  GI consulted and patient underwent upper endoscopy with no source of bleeding noted, colonoscopy was attempted but due to poor prep may need to be repeated.  Patient seen by urology due to complaints of hematuria and work-up underway.   Assessment & Plan:   Principal Problem:   Acute pulmonary embolism (HCC) Active Problems:   Anemia   HIV disease ([place])   Acute kidney injury ([place])   Acute DVT (deep venous thrombosis) (HCC)   Occult blood in stools   Pyuria, sterile   Acute pulmonary embolism without acute cor pulmonale (HCC)   Gross hematuria   Symptomatic anemia   Hypertension   Community acquired pneumonia  #1 acute hypoxic respiratory failure/bilateral PE -Patient presented with a symptomatic acute bilateral PE, CT chest done concerning for right ventricular strain -2D echo done also with concerns for right ventricular strain. -Lower extremity Dopplers negative for DVT. -IVC filter placed 02/27/2021 per IR -Case discussed by Dr. Si Raider with vascular surgery who advised starting anticoagulation which was ordered with no bolus and aiming for the low end of therapeutic window, if further bleeding will likely need to discontinue anticoagulation. -Dr. Si Raider, discussed with IR and was wondering whether thrombectomy or lysis may be helpful however per IR due to location of PE not suitable for  thrombectomy/lysis. -O2 being weaned down with sats of 97% on 2 L. -Per pharmacy heparin level not therapeutic at this time and being adjusted for. -CT abdomen and pelvis hematuria work-up with no acute abnormalities noted, no procedures scheduled and as such patient has been transitioned to full dose Lovenox.   -Lasix 20 mg IV x1.  -Supportive care.  2.  Hematuria -Patient with complaints of intermittent gross hematuria over the past month with no history of urinary malignancy. -Urinalysis with no gross hematuria, urine cultures negative. -With clear yellow urine. -Urology consulted and following and advised CT renal stone protocol/urogram however patient unable to lay flat and as such test postponed and patient subsequently underwent CT abdomen and pelvis on 03/02/2021 with no hydronephrosis, no renal lesions, no filling defects, no bladder lesion noted. -Renal ultrasound with no acute abnormalities, negative for hydronephrosis. -Patient being followed by urology and recommended outpatient cystoscopy for further evaluation. -Urology following and appreciate input and recommendations.  3.  Symptomatic anemia/FOBT -On presentation patient noted to have hemoglobin in the 4 which was normal 05/2020. -Patient denied any overt melena or hematochezia however positive guaiac. -Patient did endorse some intermittent hematuria. -GI consulted, patient underwent upper endoscopy 02/28/2021 with no source of bleeding noted. -Colonoscopy done 02/28/2021 with poor prep and unrevealing, GI to reassess and tentatively plan for repeat colonoscopy tomorrow. -GI recommended full liquid diet which we will continue for now. -Status post transfusion 4 units packed red blood cells during the hospitalization. -Hemoglobin at 7.7 and stable. -LDH noted to be low haptoglobin elevated  on 02/2023 with low suspicion for hemolysis. -Anemia panel consistent with anemia of chronic disease with iron level of 16, TIBC of 159,  ferritin of 1499.  Vitamin B12 480. -Continue IV PPI every 12 hours -GI following. -Transfusing for total hemoglobin < 7.  4.  HIV -Continue triumeq. -Compliant with medications, outpatient follow-up with ID.  5.  Hypertension -Increase Norvasc to 10 mg daily.   6.  Pulmonary infiltrate/Probable CAP -Concern for pneumonia noted on CT chest and chest x-ray. -Blood cultures pending with no growth to date. -MRSA PCR negative. -Urine Legionella antigen negative, urine pneumococcus antigen negative. -Status post 3 days azithromycin. -Continue IV Rocephin and treat for total of 7 days..   DVT prophylaxis: Heparin Code Status: Full Family Communication: Updated patient.  No family at bedside. Disposition:   Status is: Inpatient  Remains inpatient appropriate because: Severity of illness       Consultants:  Interventional radiology Urology: Dr. Abner Greenspan 02/28/2021 Gastroenterology: Dr. Alessandra Bevels 02/26/2021 PCCM admission: Dr.Jeong   Procedures:  CT angiogram chest 02/26/2021 Chest x-ray 02/25/2021 Ultrasound-guided for vascular access/IVC catheterization and venogram/IVC filter placement--per IR, Dr.Mugweru 02/27/2021 Renal ultrasound 02/28/2021 2D echo 02/26/2021 Lower extremity Dopplers 02/26/2021 CT hematuria work-up pending Colonoscopy 02/28/2021-Per Dr. Paulita Fujita  Colonoscopy 02/28/2021-Per Dr. Paulita Fujita Transfuse 4 units packed red blood cells 02/26/2021 CT abdomen and pelvis 03/02/2021   Antimicrobials: IV Rocephin 02/27/2021>>>>> IV azithromycin 02/26/2021>>>>> 02/28/2021   Subjective: Patient laying in bed.  No chest pain.  No shortness of breath.  No bleeding.  Tolerating current diet.  Denies any significant worsening shortness of breath.  Overall slowly feeling better.  Objective: Vitals:   03/03/21 1400 03/03/21 1700 03/03/21 1800 03/03/21 1900  BP: (!) 170/112  138/87 127/84  Pulse: (!) 106     Resp: 19  (!) 32 (!) 26  Temp:  97.6 F (36.4 C)     TempSrc:  Oral    SpO2: 95% 97%    Weight:      Height:        Intake/Output Summary (Last 24 hours) at 03/03/2021 1909 Last data filed at 03/03/2021 1000 Gross per 24 hour  Intake 166.95 ml  Output 750 ml  Net -583.05 ml    Filed Weights   03/01/21 0430 03/02/21 0500 03/03/21 0500  Weight: 110.5 kg 110.2 kg 110 kg    Examination:  General exam: NAD. Respiratory system: Decreased breath sounds at the bases.  Shallow breaths.  Speaking in full sentences Cardiovascular system: Tachycardia.  No JVD, no murmurs rubs or gallops.  No significant lower extremity edema.  Gastrointestinal system: Abdomen is soft, nontender, nondistended, positive bowel sounds.  Ostomy bag intact.   Central nervous system: Alert and oriented. No focal neurological deficits. Extremities: Symmetric 5 x 5 power. Skin: No rashes, lesions or ulcers Psychiatry: Judgement and insight appear normal. Mood & affect appropriate.     Data Reviewed: I have personally reviewed following labs and imaging studies  CBC: Recent Labs  Lab 02/25/21 2349 02/26/21 0524 02/27/21 0426 02/27/21 0646 02/27/21 1545 02/28/21 0259 02/28/21 1506 03/01/21 0527 03/02/21 0550 03/03/21 0255  WBC 11.2*   < > 9.9 13.3* 13.9* 16.1*  --  13.7* 13.8* 12.2*  NEUTROABS 7.5  --  7.0 10.3*  --   --   --   --   --   --   HGB 4.2*   < > 11.5* 7.7* 7.4* 8.0* 7.8* 7.7* 8.1* 7.7*  HCT 14.5*   < > 40.1 24.8* 23.9* 26.3*  --  25.8* 26.0* 26.1*  MCV 91.8   < > 98.3 91.9 92.3 93.6  --  93.5 92.2 94.6  PLT 413*   < > 296 470* 394 369  --  359 367 355   < > = values in this interval not displayed.     Basic Metabolic Panel: Recent Labs  Lab 02/26/21 0524 02/27/21 0248 02/27/21 0646 02/28/21 0259 03/01/21 0527 03/02/21 0550 03/03/21 0255  NA 133*   < > 134* 133* 133* 134* 133*  K 5.2*   < > 4.9 4.7 4.6 4.3 4.0  CL 106   < > 110 109 109 108 108  CO2 17*   < > 16* 17* 18* 18* 18*  GLUCOSE 110*   < > 104* 115* 106* 103* 108*   BUN 42*   < > 21* 13 14 13 12   CREATININE 1.89*   < > 1.13 0.98 1.17 1.00 0.93  CALCIUM 8.1*   < > 8.5* 8.6* 8.6* 8.7* 8.8*  MG 2.5*  --   --   --   --   --  1.8   < > = values in this interval not displayed.     GFR: Estimated Creatinine Clearance: 119.4 mL/min (by C-G formula based on SCr of 0.93 mg/dL).  Liver Function Tests: Recent Labs  Lab 02/25/21 2349 02/28/21 0259  AST 12* 11*  ALT 7 6  ALKPHOS 84 98  BILITOT 0.5 1.2  PROT 8.1 8.0  ALBUMIN 2.4* 2.3*     CBG: No results for input(s): GLUCAP in the last 168 hours.   Recent Results (from the past 240 hour(s))  Resp Panel by RT-PCR (Flu A&B, Covid) Nasopharyngeal Swab     Status: None   Collection Time: 02/26/21 12:32 AM   Specimen: Nasopharyngeal Swab; Nasopharyngeal(NP) swabs in vial transport medium  Result Value Ref Range Status   SARS Coronavirus 2 by RT PCR NEGATIVE NEGATIVE Final    Comment: (NOTE) SARS-CoV-2 target nucleic acids are NOT DETECTED.  The SARS-CoV-2 RNA is generally detectable in upper respiratory specimens during the acute phase of infection. The lowest concentration of SARS-CoV-2 viral copies this assay can detect is 138 copies/mL. A negative result does not preclude SARS-Cov-2 infection and should not be used as the sole basis for treatment or other patient management decisions. A negative result may occur with  improper specimen collection/handling, submission of specimen other than nasopharyngeal swab, presence of viral mutation(s) within the areas targeted by this assay, and inadequate number of viral copies(<138 copies/mL). A negative result must be combined with clinical observations, patient history, and epidemiological information. The expected result is Negative.  Fact Sheet for Patients:  EntrepreneurPulse.com.au  Fact Sheet for Healthcare Providers:  IncredibleEmployment.be  This test is no t yet approved or cleared by the [place] FDA and  has been authorized for detection and/or diagnosis of SARS-CoV-2 by FDA under an Emergency Use Authorization (EUA). This EUA will remain  in effect (meaning this test can be used) for the duration of the COVID-19 declaration under Section 564(b)(1) of the Act, 21 U.S.C.section 360bbb-3(b)(1), unless the authorization is terminated  or revoked sooner.       Influenza A by PCR NEGATIVE NEGATIVE Final   Influenza B by PCR NEGATIVE NEGATIVE Final    Comment: (NOTE) The Xpert Xpress SARS-CoV-2/FLU/RSV plus assay is intended as an aid in the diagnosis of influenza from Nasopharyngeal swab specimens and should not be used as a sole basis for treatment. Nasal washings and  aspirates are unacceptable for Xpert Xpress SARS-CoV-2/FLU/RSV testing.  Fact Sheet for Patients: EntrepreneurPulse.com.au  Fact Sheet for Healthcare Providers: IncredibleEmployment.be  This test is not yet approved or cleared by the [place] FDA and has been authorized for detection and/or diagnosis of SARS-CoV-2 by FDA under an Emergency Use Authorization (EUA). This EUA will remain in effect (meaning this test can be used) for the duration of the COVID-19 declaration under Section 564(b)(1) of the Act, 21 U.S.C. section 360bbb-3(b)(1), unless the authorization is terminated or revoked.  Performed at [facility], [place] [address] [place], [place] [address]   Urine Culture     Status: None   Collection Time: 02/26/21  2:31 AM   Specimen: Urine, Clean Catch  Result Value Ref Range Status   Specimen Description   Final    URINE, CLEAN CATCH Performed at [facility], [place] [address] [place], [place] [address]    Special Requests   Final    Immunocompromised Performed at [facility], [place] [address] [place], [place] [address]    Culture   Final    NO GROWTH Performed at [facility],  [place] [address] [place], [place] [address]    Report Status 02/27/2021 FINAL  Final  Culture, blood (routine x 2)     Status: None   Collection Time: 02/26/21  5:24 AM   Specimen: BLOOD  Result Value Ref Range Status   Specimen Description   Final    BLOOD LEFT ANTECUBITAL Performed at [place] [address] [place], [place] [address]    Special Requests   Final    BOTTLES DRAWN AEROBIC AND ANAEROBIC Blood Culture results may not be optimal due to an inadequate volume of blood received in culture bottles Performed at [place] [address], [place], [place] [address]    Culture   Final    NO GROWTH 5 DAYS Performed at [facility], [place] [address] [place], [place] [address]    Report Status 03/03/2021 FINAL  Final  MRSA Next Gen by PCR, Nasal     Status: None   Collection Time: 02/26/21  5:24 AM   Specimen: Nasal Mucosa; Nasal Swab  Result Value Ref Range Status   MRSA by PCR Next Gen NOT DETECTED NOT DETECTED Final    Comment: (NOTE) The GeneXpert MRSA Assay (FDA approved for NASAL specimens only), is one component of a comprehensive MRSA colonization surveillance program. It is not intended to diagnose MRSA infection nor to guide or monitor treatment for MRSA infections. Test performance is not FDA approved in patients less than 42 years old. Performed at [facility], [place] [address], [place], [place] [address]   MRSA Next Gen by PCR, Nasal     Status: None   Collection Time: 02/26/21  9:11 AM   Specimen: Nasal Mucosa; Nasal Swab  Result Value Ref Range Status   MRSA by PCR Next Gen NOT DETECTED NOT DETECTED Final    Comment: (NOTE) The GeneXpert MRSA Assay (FDA approved for NASAL specimens only), is one component of a comprehensive MRSA colonization surveillance program. It is not intended to diagnose MRSA infection nor to guide or monitor treatment for MRSA infections. Test performance is not FDA  approved in patients less than 31 years old. Performed at [facility], [place] [address], [place], [place] [address]   Culture, blood (routine x 2)     Status:  None   Collection Time: 02/26/21 10:42 AM   Specimen: BLOOD LEFT FOREARM  Result Value Ref Range Status   Specimen Description   Final    BLOOD LEFT FOREARM Performed at [place] [address], [place], [place] [address]    Special Requests   Final    BOTTLES DRAWN AEROBIC ONLY Blood Culture adequate volume Performed at [place] [address], [place], [place] [address]    Culture   Final    NO GROWTH 5 DAYS Performed at [facility], [place] [address] [place], [place] [address]    Report Status 03/03/2021 FINAL  Final          Radiology Studies: CT HEMATURIA WORKUP  Result Date: 03/02/2021 CLINICAL DATA:  Hematuria, recent PE EXAM: CT ABDOMEN AND PELVIS WITHOUT AND WITH CONTRAST TECHNIQUE: Multidetector CT imaging of the abdomen and pelvis was performed following the standard protocol before and following the bolus administration of intravenous contrast. CONTRAST:  170mL OMNIPAQUE IOHEXOL 350 MG/ML SOLN COMPARISON:  CT chest angiogram, 02/26/2021 FINDINGS: Lower chest: New, small right pleural effusion. Dense, masslike infrahilar consolidation of the right lower lobe (series 4, image 10). Known pulmonary embolism is not well appreciated on this non tailored examination. Hepatobiliary: No solid liver abnormality is seen. Hepatomegaly, maximum coronal span 23.2 cm no gallstones, gallbladder wall thickening, or biliary dilatation. Pancreas: Unremarkable. No pancreatic ductal dilatation or surrounding inflammatory changes. Spleen: Normal in size without significant abnormality. Adrenals/Urinary Tract: Adrenal glands are unremarkable. Kidneys are normal, without renal calculi, solid lesion, or hydronephrosis. Bladder is unremarkable. Stomach/Bowel: Stomach is  within normal limits. Appendix appears normal. No evidence of bowel wall thickening, distention, or inflammatory changes. Vascular/Lymphatic: Infrarenal IVC filter. No enlarged abdominal or pelvic lymph nodes. Reproductive: No mass or other significant abnormality. Other: Anasarca. Left lower quadrant diverting loop sigmoid colostomy. Small volume ascites. Musculoskeletal: No acute or significant osseous findings. IMPRESSION: 1. No evidence of urinary tract calculus, suspicious renal lesion, or urinary tract filling defect to explain hematuria. No hydronephrosis. 2. Dense, masslike infrahilar consolidation of the right lower lobe, as seen on prior CT angiogram examination. Although this may reflect pulmonary infarction in the setting of embolism, lung mass is very difficult to exclude given this appearance. At minimum, recommend follow-up in 6-8 weeks to observe resolution. 3. New, small right pleural effusion. 4. Anasarca and small volume ascites. 5. Hepatomegaly. 6. Left lower quadrant diverting loop sigmoid colostomy. 7. Infrarenal IVC filter. Electronically Signed   By: Delanna Ahmadi M.D.   On: 03/02/2021 12:46        Scheduled Meds:  abacavir-dolutegravir-lamiVUDine  1 tablet Oral Daily   amLODipine  10 mg Oral Daily   Chlorhexidine Gluconate Cloth  6 each Topical Daily   enoxaparin (LOVENOX) injection  110 mg Subcutaneous Q12H   guaiFENesin  5 mL Oral TID   pantoprazole  40 mg Oral Daily   sodium bicarbonate  650 mg Oral BID   Continuous Infusions:  sodium chloride 10 mL/hr at 03/03/21 0728   cefTRIAXone (ROCEPHIN)  IV Stopped (03/03/21 [id number])     LOS: 5 days    Time spent: 40 minutes    Irine Seal, MD [facility]   To contact the attending provider between 7A-7P or the covering provider during after hours 7P-7A, please log into the web site [email or web address] and access using universal [place] password for that web site. If you do not have the password, please call the  hospital  operator.  03/03/2021, 7:09 PM

## 2021-03-03 NOTE — Progress Notes (Signed)
3 Days Post-Op Subjective: Denies pain, no nausea or emesis. Voiding yellow urine.  Objective: Vital signs in last 24 hours: Temp:  [98 F (36.7 C)-99.4 F (37.4 C)] 98.3 F (36.8 C) (11/29 0400) Pulse Rate:  [91-109] 91 (11/29 0500) Resp:  [24-41] 29 (11/29 0500) BP: (135-171)/(76-106) 161/94 (11/29 0500) SpO2:  [96 %-100 %] 97 % (11/29 0500) Weight:  [110 kg] 110 kg (11/29 0500)  Intake/Output from previous day: 11/28 0701 - 11/29 0700 In: 289.2 [I.V.:289.2] Out: [id number] [Urine:1675] Intake/Output this shift: Total I/O In: 53.6 [I.V.:53.6] Out: 600 [Urine:600]  Physical Exam:  General: Alert and oriented CV: RRR Lungs: Clear Abdomen: Soft, ND, NT GU: evidence of chronic hidradenitis over right hemiscrotum and thigh. No erythema, abscess, necrosis, or fluctuance Ext: NT, No erythema  Lab Results: Recent Labs    03/01/21 0527 03/02/21 0550 03/03/21 0255  HGB 7.7* 8.1* 7.7*  HCT 25.8* 26.0* 26.1*   BMET Recent Labs    03/02/21 0550 03/03/21 0255  NA 134* 133*  K 4.3 4.0  CL 108 108  CO2 18* 18*  GLUCOSE 103* 108*  BUN 13 12  CREATININE 1.00 0.93  CALCIUM 8.7* 8.8*     Studies/Results: CT HEMATURIA WORKUP  Result Date: 03/02/2021 CLINICAL DATA:  Hematuria, recent PE EXAM: CT ABDOMEN AND PELVIS WITHOUT AND WITH CONTRAST TECHNIQUE: Multidetector CT imaging of the abdomen and pelvis was performed following the standard protocol before and following the bolus administration of intravenous contrast. CONTRAST:  171mL OMNIPAQUE IOHEXOL 350 MG/ML SOLN COMPARISON:  CT chest angiogram, 02/26/2021 FINDINGS: Lower chest: New, small right pleural effusion. Dense, masslike infrahilar consolidation of the right lower lobe (series 4, image 10). Known pulmonary embolism is not well appreciated on this non tailored examination. Hepatobiliary: No solid liver abnormality is seen. Hepatomegaly, maximum coronal span 23.2 cm no gallstones, gallbladder wall thickening, or biliary  dilatation. Pancreas: Unremarkable. No pancreatic ductal dilatation or surrounding inflammatory changes. Spleen: Normal in size without significant abnormality. Adrenals/Urinary Tract: Adrenal glands are unremarkable. Kidneys are normal, without renal calculi, solid lesion, or hydronephrosis. Bladder is unremarkable. Stomach/Bowel: Stomach is within normal limits. Appendix appears normal. No evidence of bowel wall thickening, distention, or inflammatory changes. Vascular/Lymphatic: Infrarenal IVC filter. No enlarged abdominal or pelvic lymph nodes. Reproductive: No mass or other significant abnormality. Other: Anasarca. Left lower quadrant diverting loop sigmoid colostomy. Small volume ascites. Musculoskeletal: No acute or significant osseous findings. IMPRESSION: 1. No evidence of urinary tract calculus, suspicious renal lesion, or urinary tract filling defect to explain hematuria. No hydronephrosis. 2. Dense, masslike infrahilar consolidation of the right lower lobe, as seen on prior CT angiogram examination. Although this may reflect pulmonary infarction in the setting of embolism, lung mass is very difficult to exclude given this appearance. At minimum, recommend follow-up in 6-8 weeks to observe resolution. 3. New, small right pleural effusion. 4. Anasarca and small volume ascites. 5. Hepatomegaly. 6. Left lower quadrant diverting loop sigmoid colostomy. 7. Infrarenal IVC filter. Electronically Signed   By: Delanna Ahmadi M.D.   On: 03/02/2021 12:46    Assessment/Plan: Anemia: Hgb 4 on admission. S/p 4u pRBC with hgb at 8 at consultation. Unremarkable EGD and colonoscopy 02/28/21 History of gross hematuria: Per patient, intermittent over the past 2 months, not associated with clots. Resolved for previous 2 weeks prior to admission. Currently voiding clear yellow urine. CT hematuria 11/28 with no GU pathology.  Bilateral pulmonary embolism Pulmonary infiltrate, treating for possible CAP HIV  -I reviewed  CT hematuria with  patient with no evidence of any GU pathology.  There was no renal lesions, filling defect in the ureters, hydronephrosis or bladder lesion. -I think is unlikely that his profound anemia is due to his mild gross hematuria that resolved a couple weeks ago. -Urine cytology is still pending -I will arrange for cystoscopy outpatient. -Follow-up arranged.  Please call with any questions.   LOS: 5 days   Matt R. Viktor Philipp MD 03/03/2021, 6:59 AM [facility]  Pager: [phone]

## 2021-03-03 NOTE — Evaluation (Signed)
Physical Therapy Evaluation Patient Details Name: Leon Nichols MRN: [id number] DOB: 1965-08-08 Today's Date: 03/03/2021  History of Present Illness  55 yo male presenting to ED on 11/24 with dyspnea, R lower chest pain, and BLE edema. CTA chest also showed right lower lobe consolidation and right pleural effusion in addition to the pulmonary emboli. Image showing BLE DVTs. S/p IVC filter placement on 11/25. S/p EGD and colonoscopy for GI bleed on 11/26. PMH including HIV, HTN, perianal abscess, perirectal abscess, R knee arthroscopy, and shingles.  Clinical Impression  Patient seated on bed edge. Reports back and chest pain. Patient's SPO2  > 91 % to ambulate ` 30' in room. Mild balance loss. Patient lives alone in second floor apartment. Patient should progress to return home. Patient has 3 daughters that may assist if necessary.  Pt admitted with above diagnosis.  Pt currently with functional limitations due to the deficits listed below (see PT Problem List). Pt will benefit from skilled PT to increase their independence and safety with mobility to allow discharge to the venue listed below.          Recommendations for follow up therapy are one component of a multi-disciplinary discharge planning process, led by the attending physician.  Recommendations may be updated based on patient status, additional functional criteria and insurance authorization.  Follow Up Recommendations Home health PT    Assistance Recommended at Discharge Intermittent Supervision/Assistance  Functional Status Assessment Patient has had a recent decline in their functional status and demonstrates the ability to make significant improvements in function in a reasonable and predictable amount of time.  Equipment Recommendations  None recommended by PT    Recommendations for Other Services       Precautions / Restrictions Precautions Precautions: Fall;Other (comment) Precaution Comments: Watch SPO2       Mobility  Bed Mobility               General bed mobility comments: Sitting at EOB upon arrival    Transfers Overall transfer level: Needs assistance   Transfers: Sit to/from Stand Sit to Stand: Min guard           General transfer comment: Min Guard A for safety    Ambulation/Gait Ambulation/Gait assistance: Min guard Gait Distance (Feet): 30 Feet Assistive device: None Gait Pattern/deviations: WFL(Within Functional Limits);Staggering right Gait velocity: decr     General Gait Details: staggered x 1 when turned in BR  Stairs            Wheelchair Mobility    Modified Rankin (Stroke Patients Only)       Balance Overall balance assessment: Mild deficits observed, not formally tested                                           Pertinent Vitals/Pain Pain Assessment: Faces Faces Pain Scale: Hurts even more Pain Location: back and chest Pain Descriptors / Indicators: Aching Pain Intervention(s): Monitored during session    Home Living Family/patient expects to be discharged to:: Private residence Living Arrangements: Alone Available Help at Discharge: Family;Available PRN/intermittently (Three daughters) Type of Home: Apartment Home Access: Stairs to enter Entrance Stairs-Rails: Right     Home Layout: One level Home Equipment: None      Prior Function Prior Level of Function : Independent/Modified Independent             Mobility Comments: ADLs,  IADLs, work, carpentry/brick Nurse, learning disability   Dominant Hand: Right    Extremity/Trunk Assessment   Upper Extremity Assessment Upper Extremity Assessment: Overall WFL for tasks assessed    Lower Extremity Assessment Lower Extremity Assessment: Generalized weakness    Cervical / Trunk Assessment Cervical / Trunk Assessment: Normal  Communication   Communication: No difficulties  Cognition Arousal/Alertness: Awake/alert Behavior During Therapy: WFL  for tasks assessed/performed Overall Cognitive Status: Within Functional Limits for tasks assessed                                 General Comments: Slight decrease awareness of deficits        General Comments General comments (skin integrity, edema, etc.): Pt wearing 4L O2 upon arrival and states it is "to make my chest feel better". SpO2 90s on RA during activity. Pt requesting O2 at end of session. HR 113. RR 30s    Exercises     Assessment/Plan    PT Assessment Patient needs continued PT services  PT Problem List Decreased strength;Decreased balance;Decreased knowledge of precautions;Decreased knowledge of use of DME;Decreased range of motion;Decreased activity tolerance;Decreased safety awareness       PT Treatment Interventions DME instruction;Therapeutic activities;Gait training;Therapeutic exercise;Patient/family education;Functional mobility training;Stair training    PT Goals (Current goals can be found in the Care Plan section)  Acute Rehab PT Goals Patient Stated Goal: go home PT Goal Formulation: With patient Time For Goal Achievement: 03/17/21 Potential to Achieve Goals: Good    Frequency Min 3X/week   Barriers to discharge        Co-evaluation PT/OT/SLP Co-Evaluation/Treatment: Yes Reason for Co-Treatment: For patient/therapist safety PT goals addressed during session: Mobility/safety with mobility OT goals addressed during session: ADL's and self-care       AM-PAC PT "6 Clicks" Mobility  Outcome Measure Help needed turning from your back to your side while in a flat bed without using bedrails?: None Help needed moving from lying on your back to sitting on the side of a flat bed without using bedrails?: None Help needed moving to and from a bed to a chair (including a wheelchair)?: A Little Help needed standing up from a chair using your arms (e.g., wheelchair or bedside chair)?: A Little Help needed to walk in hospital room?: A  Little Help needed climbing 3-5 steps with a railing? : A Lot 6 Click Score: 19    End of Session   Activity Tolerance: Patient tolerated treatment well;Patient limited by fatigue Patient left: in chair;with call bell/phone within reach;with chair alarm set Nurse Communication: Mobility status PT Visit Diagnosis: Unsteadiness on feet (R26.81)    Time: 9191-6606 PT Time Calculation (min) (ACUTE ONLY): 16 min   Charges:   PT Evaluation $PT Eval Low Complexity: 1 Low          Blanchard Kelch PT Acute Rehabilitation Services Pager [phone] Office [phone]   Rada Hay 03/03/2021, 4:15 PM

## 2021-03-03 NOTE — Progress Notes (Signed)
[facility] Gastroenterology Progress Note  Leon Nichols 55 y.o. [birth year]  CC:  Anemia   Subjective: Patient is tolerating diet well. Denies melena/hematochezia. Denies nausea/vomiting. Denies abdominal pain.  ROS : Review of Systems  Cardiovascular:  Negative for chest pain and palpitations.  Gastrointestinal:  Negative for abdominal pain, blood in stool, constipation, diarrhea, heartburn, melena, nausea and vomiting.     Objective: Vital signs in last 24 hours: Vitals:   03/03/21 1100 03/03/21 1145  BP: (!) 165/107   Pulse:    Resp: (!) 39   Temp:  (!) 97.4 F (36.3 C)  SpO2:      Physical Exam:  General:  Alert, cooperative, no distress, appears stated age  Head:  Normocephalic, without obvious abnormality, atraumatic  Eyes:  Anicteric sclera, EOM's intact  Lungs:   Clear to auscultation bilaterally, respirations unlabored  Heart:  Regular rate and rhythm, S1, S2 normal  Abdomen:   Soft, non-tender, bowel sounds active all four quadrants,  no masses,     Lab Results: Recent Labs    03/02/21 0550 03/03/21 0255  NA 134* 133*  K 4.3 4.0  CL 108 108  CO2 18* 18*  GLUCOSE 103* 108*  BUN 13 12  CREATININE 1.00 0.93  CALCIUM 8.7* 8.8*  MG  --  1.8   No results for input(s): AST, ALT, ALKPHOS, BILITOT, PROT, ALBUMIN in the last 72 hours. Recent Labs    03/02/21 0550 03/03/21 0255  WBC 13.8* 12.2*  HGB 8.1* 7.7*  HCT 26.0* 26.1*  MCV 92.2 94.6  PLT 367 355   No results for input(s): LABPROT, INR in the last 72 hours.    Assessment Anemia - EGD 11/26: normal esophagus, stomach, and duodenal b ulb. No source of anemia on endoscopy. - Colonoscopy 11/26 unable to be performed due to poor prep. - HGB 7.7 - Status post transfusion 4 units packed red blood cells during the hospitalization - Anemia panel consistent with anemia of chronic disease with iron level of 16, TIBC of 159, ferritin of 1499.  Vitamin B12 480. - BUN 12, Cr 0.93   Colostomy - hx of  perianal/rectal abscess, loop colostomy in 2013 that has not been taken down.   Bilateral PEs   Hematuria   Possible CAP - WBC 12.2, improving.   Plan: Patient is still not seeing any blood. Not reporting any GI symptoms. Tolerating diet well. Recommend follow up as an outpatient for repeat colonoscopy prior to ostomy reversal. [facility] will sign off. Please contact us if we can be of any further assistance during this hospital stay.   Legrand Como PA-C 03/03/2021, 11:48 AM  Contact #  [phone]

## 2021-03-04 LAB — BASIC METABOLIC PANEL
Anion gap: 8 (ref 5–15)
BUN: 14 mg/dL (ref 6–20)
CO2: 18 mmol/L — ABNORMAL LOW (ref 22–32)
Calcium: 8.6 mg/dL — ABNORMAL LOW (ref 8.9–10.3)
Chloride: 103 mmol/L (ref 98–111)
Creatinine, Ser: 1.03 mg/dL (ref 0.61–1.24)
GFR, Estimated: >60 mL/min (ref >60)
Glucose, Bld: 112 mg/dL — ABNORMAL HIGH (ref 70–99)
Potassium: 3.7 mmol/L (ref 3.5–5.1)
Sodium: 129 mmol/L — ABNORMAL LOW (ref 135–145)

## 2021-03-04 LAB — CBC
HCT: 24.4 % — ABNORMAL LOW (ref 39.0–52.0)
Hemoglobin: 7.1 g/dL — ABNORMAL LOW (ref 13.0–17.0)
MCH: 27.1 pg (ref 26.0–34.0)
MCHC: 29.1 g/dL — ABNORMAL LOW (ref 30.0–36.0)
MCV: 93.1 fL (ref 80.0–100.0)
Platelets: 353 10*3/uL (ref 150–400)
RBC: 2.62 MIL/uL — ABNORMAL LOW (ref 4.22–5.81)
RDW: 17.4 % — ABNORMAL HIGH (ref 11.5–15.5)
WBC: 11 10*3/uL — ABNORMAL HIGH (ref 4.0–10.5)
nRBC: 0 % (ref 0.0–0.2)

## 2021-03-04 MED ORDER — APIXABAN 5 MG PO TABS
5.0000 mg | ORAL_TABLET | Freq: Two times a day (BID) | ORAL | Status: DC
  Administered 2021-03-04 – 2021-03-06 (×4): 5 mg via ORAL
  Filled 2021-03-04 (×4): qty 1

## 2021-03-04 MED ORDER — APIXABAN 5 MG PO TABS: 5.0000 mg | ORAL_TABLET | Freq: Two times a day (BID) | ORAL | Status: DC

## 2021-03-04 MED ORDER — HYDROCOD POLST-CPM POLST ER 10-8 MG/5ML PO SUER
5.0000 mL | Freq: Two times a day (BID) | ORAL | Status: DC | PRN
  Administered 2021-03-04 – 2021-03-05 (×2): 5 mL via ORAL
  Filled 2021-03-04: qty 1
  Filled 2021-03-04: qty 5

## 2021-03-04 MED ORDER — APIXABAN 5 MG PO TABS: 10.0000 mg | ORAL_TABLET | Freq: Two times a day (BID) | ORAL | Status: DC

## 2021-03-04 NOTE — Progress Notes (Signed)
ANTICOAGULATION CONSULT NOTE - Follow Up Consult  Pharmacy Consult for IV heparin>>Lovenox > Apixaban 11/30 Indication: pulmonary embolus  No Known Allergies  Patient Measurements: Height: 6\' 1"  (185.4 cm) Weight: 110.3 kg (243 lb 2.7 oz) IBW/kg (Calculated) : 79.9 Heparin Dosing Weight: 103 kg   Vital Signs: Temp: 97.5 F (36.4 C) (11/30 0745) Temp Source: Oral (11/30 0745) BP: 137/88 (11/30 0900) Pulse Rate: 103 (11/30 0900)  Labs: Recent Labs    03/01/21 2130 03/02/21 0550 03/02/21 0550 03/02/21 1534 03/03/21 0255 03/04/21 0313  HGB  --  8.1*   < >  --  7.7* 7.1*  HCT  --  26.0*  --   --  26.1* 24.4*  PLT  --  367  --   --  355 353  HEPARINUNFRC <0.10* <0.10*  --  0.11*  --   --   CREATININE  --  1.00  --   --  0.93 1.03   < > = values in this interval not displayed.     Estimated Creatinine Clearance: 108 mL/min (by C-G formula based on SCr of 1.03 mg/dL).   Medications:  Scheduled:   abacavir-dolutegravir-lamiVUDine  1 tablet Oral Daily   amLODipine  10 mg Oral Daily   apixaban  10 mg Oral BID   Followed by   03/06/21 ON 03/11/2021] apixaban  5 mg Oral BID   Chlorhexidine Gluconate Cloth  6 each Topical Daily   guaiFENesin  5 mL Oral TID   pantoprazole  40 mg Oral Daily   sodium bicarbonate  650 mg Oral BID    Assessment: Pharmacy is consulted to dose heparin in 55 yo male diagnosed with PE with right heart strain. Pt not initially started on anticoagulation as Hgb was 4.4. Pt has received several transfusions during admission. IV filter placed on 11/25   Per discussion with MD, would like to avoid boluses and aim for lower heparin target range on 0.3 to 0.5 due to recent   bleeds  Spoke to Dr. 12/25 in regards to low levels. MD states that if  CT renal stone protocol unremarkable, and no further bleeding occurs and procedures completed if heparin level still not therapeutic may consider transitioning to Lovenox > done 11/18  Goal of Therapy:  Heparin  level 0.3-0.5 units/ml Monitor platelets by anticoagulation protocol: Yes  Today, 03/04/2021 Hgb 7.1   Plan:  DC Lovenox, last dose 11/30 am With risk of bleed, low Hgb, and IVC filter in place, will start Apixaban at 5mg  bid per MD  12/30 PharmD [facility] [phone] 03/04/2021, 11:31 AM

## 2021-03-04 NOTE — Discharge Instructions (Addendum)
Information on my medicine - ELIQUIS (apixaban)  This medication education was reviewed with me or my healthcare representative as part of my discharge preparation.  The pharmacist that spoke with me during my hospital stay was:    Why was Eliquis prescribed for you? Eliquis was prescribed to treat blood clots that may have been found in the veins of your legs (deep vein thrombosis) or in your lungs (pulmonary embolism) and to reduce the risk of them occurring again.  What do You need to know about Eliquis ? Since you have received Heparin infusion and Lovenox injections prior to starting Eliquis, will start with ONE 5 mg tablet taken TWICE daily.  Eliquis may be taken with or without food.   Try to take the dose about the same time in the morning and in the evening. If you have difficulty swallowing the tablet whole please discuss with your pharmacist how to take the medication safely.  Take Eliquis exactly as prescribed and DO NOT stop taking Eliquis without talking to the doctor who prescribed the medication.  Stopping may increase your risk of developing a new blood clot.  Refill your prescription before you run out.  After discharge, you should have regular check-up appointments with your healthcare provider that is prescribing your Eliquis.    What do you do if you miss a dose? If a dose of ELIQUIS is not taken at the scheduled time, take it as soon as possible on the same day and twice-daily administration should be resumed. The dose should not be doubled to make up for a missed dose.  Important Safety Information A possible side effect of Eliquis is bleeding. You should call your healthcare provider right away if you experience any of the following: Bleeding from an injury or your nose that does not stop. Unusual colored urine (red or dark brown) or unusual colored stools (red or black). Unusual bruising for unknown reasons. A serious fall or if you hit your head (even if  there is no bleeding).  Some medicines may interact with Eliquis and might increase your risk of bleeding or clotting while on Eliquis. To help avoid this, consult your healthcare provider or pharmacist prior to using any new prescription or non-prescription medications, including herbals, vitamins, non-steroidal anti-inflammatory drugs (NSAIDs) and supplements.  This website has more information on Eliquis (apixaban): http://www.eliquis.com/eliquis/home

## 2021-03-04 NOTE — TOC Benefit Eligibility Note (Signed)
Transition of Care [facility]) Benefit Eligibility Note    Patient Details  Name: MCCOY TESTA MRN: [id number] Date of Birth: [birth year]   Medication/Dose: Eliquis  Covered?: Yes  Tier: Other  Prescription Coverage Preferred Pharmacy: local  Spoke with Person/Company/Phone Number:: Meds through Medicaid  Co-Pay: 10.00  Prior Approval: No  Deductible: Met       Kerin Salen Phone Number: 03/04/2021, 3:37 PM

## 2021-03-04 NOTE — Progress Notes (Signed)
PROGRESS NOTE    Leon Nichols  IPJ:[id number] DOB: June 08, 1965 DOA: 02/25/2021 PCP: Oneita Hurt, No    Chief Complaint  Patient presents with   Shortness of Breath    Brief Narrative:   55 year old male with prior h/o of HIV, HTN, EtOH, colostomy, presenting on 11/23 with lower extremity swelling and dyspnea and pleuritic right chest pain. Here found to have b/l PEs with reduced RV function on TTE, also hemoglobin in 4s from normal in February of this year. No report of melena or hematochezia but does report one month of intermittent gross blood in urine for about a month.  Patient transfused a total of 4 units packed red blood cells.  GI consulted and patient underwent upper endoscopy with no source of bleeding noted, colonoscopy was attempted but due to poor prep may need to be repeated.  Patient seen by urology due to complaints of hematuria and work-up underway.   Assessment & Plan:   Principal Problem:   Acute pulmonary embolism (HCC) Active Problems:   Anemia   HIV disease (HCC)   Acute kidney injury (HCC)   Acute DVT (deep venous thrombosis) (HCC)   Occult blood in stools   Pyuria, sterile   Acute pulmonary embolism without acute cor pulmonale (HCC)   Gross hematuria   Symptomatic anemia   Hypertension   Community acquired pneumonia   Acute respiratory failure with hypoxia/ bilateral PE:  Sec to symptomatic acute bilateral PE, with evidence of right ventricular strain.  - 2 D echo shows the same.  LE duplex neg for DVT.  -IVC filter placed by IR on 02/27/21. - -Dr. Ashok Pall, discussed with IR and was wondering whether thrombectomy or lysis may be helpful however per IR due to location of PE not suitable for thrombectomy/lysis. - wean off oxygen.  - started on heparin, transitioned to lovenox and to eliquis today.    Hematuria:  - Urology consulted, underwent CT abd and pelvis with no hydronephrosis. No mass lesions or filling defects.  - US RENAL is negative for acute abn.     Symptomatic anemia: FOBT: - s/p 4 units of pRBC transfusion.  -GI consulted, underwent EGD without any source of bleeding.  - colonoscopy done on 11/26 was poor prep, GI to reassess and tentatively plan for repeat colonoscopy.  - hemoglobin stable around 7 the last 48 hours.  - continue to monitor.     Hypertension:  Well controlled.    HIV: Resume home meds.    Pulm infiltrate/ CAP:  - Completed the course of IV antibiotics.  Urine for legionella and pneumococcal antigen is negative.  MRSA PCR is negative.     DVT prophylaxis: Heparin.  Code Status:  full code.  Family Communication: none at bedside.  Disposition:   Status is: Inpatient  Remains inpatient appropriate because: IV antibiotics.        Consultants:  Interventional radiology Urology: Dr. Cardell Peach 02/28/2021 Gastroenterology: Dr. Levora Angel 02/26/2021 PCCM admission: Dr.Jeong    Procedures:  CT angiogram chest 02/26/2021 Chest x-ray 02/25/2021 Ultrasound-guided for vascular access/IVC catheterization and venogram/IVC filter placement--per IR, Dr.Mugweru 02/27/2021 Renal ultrasound 02/28/2021 2D echo 02/26/2021 Lower extremity Dopplers 02/26/2021 CT hematuria work-up pending Colonoscopy 02/28/2021-Per Dr. Dulce Sellar  Colonoscopy 02/28/2021-Per Dr. Dulce Sellar Transfuse 4 units packed red blood cells 02/26/2021 CT abdomen and pelvis 03/02/2021    Antimicrobials:  Antibiotics Given (last 72 hours)     Date/Time Action Medication Dose Rate   03/02/21 0505 New Bag/Given   cefTRIAXone (ROCEPHIN) 2 g  in sodium chloride 0.9 % 100 mL IVPB 2 g 200 mL/hr   03/02/21 1029 Given   abacavir-dolutegravir-lamiVUDine (TRIUMEQ) 600-50-300 MG per tablet 1 tablet 1 tablet    03/03/21 0537 New Bag/Given   cefTRIAXone (ROCEPHIN) 2 g in sodium chloride 0.9 % 100 mL IVPB 2 g 200 mL/hr   03/03/21 1004 Given   abacavir-dolutegravir-lamiVUDine (TRIUMEQ) 600-50-300 MG per tablet 1 tablet 1 tablet    03/04/21 0550 New  Bag/Given   cefTRIAXone (ROCEPHIN) 2 g in sodium chloride 0.9 % 100 mL IVPB 2 g 200 mL/hr   03/04/21 0914 Given   abacavir-dolutegravir-lamiVUDine (TRIUMEQ) 600-50-300 MG per tablet 1 tablet 1 tablet          Subjective:  No new complaints.  Objective: Vitals:   03/04/21 1100 03/04/21 1147 03/04/21 1200 03/04/21 1300  BP: 129/71  118/77 132/69  Pulse: (!) 104  (!) 103   Resp: (!) 48  (!) 38 (!) 48  Temp:  98.1 F (36.7 C)    TempSrc:  Oral    SpO2: 99%  98%   Weight:      Height:        Intake/Output Summary (Last 24 hours) at 03/04/2021 1421 Last data filed at 03/04/2021 1225 Gross per 24 hour  Intake 720.35 ml  Output 1865 ml  Net -1144.65 ml   Filed Weights   03/02/21 0500 03/03/21 0500 03/04/21 0500  Weight: 110.2 kg 110 kg 110.3 kg    Examination:  General exam: Appears calm and comfortable  Respiratory system: Clear to auscultation. Respiratory effort normal. Cardiovascular system: S1 & S2 heard, RRR. No JVD,. No pedal edema. Gastrointestinal system: Abdomen is nondistended, soft and nontender. Normal bowel sounds heard. Central nervous system: Alert and oriented. No focal neurological deficits. Extremities: Symmetric 5 x 5 power. Skin: No rashes, lesions or ulcers Psychiatry: Mood & affect appropriate.     Data Reviewed: I have personally reviewed following labs and imaging studies  CBC: Recent Labs  Lab 02/25/21 2349 02/26/21 0524 02/27/21 0426 02/27/21 0646 02/27/21 1545 02/28/21 0259 02/28/21 1506 03/01/21 0527 03/02/21 0550 03/03/21 0255 03/04/21 0313  WBC 11.2*   < > 9.9 13.3*   < > 16.1*  --  13.7* 13.8* 12.2* 11.0*  NEUTROABS 7.5  --  7.0 10.3*  --   --   --   --   --   --   --   HGB 4.2*   < > 11.5* 7.7*   < > 8.0* 7.8* 7.7* 8.1* 7.7* 7.1*  HCT 14.5*   < > 40.1 24.8*   < > 26.3*  --  25.8* 26.0* 26.1* 24.4*  MCV 91.8   < > 98.3 91.9   < > 93.6  --  93.5 92.2 94.6 93.1  PLT 413*   < > 296 470*   < > 369  --  359 367 355 353   < >  = values in this interval not displayed.    Basic Metabolic Panel: Recent Labs  Lab 02/26/21 0524 02/27/21 0248 02/28/21 0259 03/01/21 0527 03/02/21 0550 03/03/21 0255 03/04/21 0313  NA 133*   < > 133* 133* 134* 133* 129*  K 5.2*   < > 4.7 4.6 4.3 4.0 3.7  CL 106   < > 109 109 108 108 103  CO2 17*   < > 17* 18* 18* 18* 18*  GLUCOSE 110*   < > 115* 106* 103* 108* 112*  BUN 42*   < > 13  14 13 12 14   CREATININE 1.89*   < > 0.98 1.17 1.00 0.93 1.03  CALCIUM 8.1*   < > 8.6* 8.6* 8.7* 8.8* 8.6*  MG 2.5*  --   --   --   --  1.8  --    < > = values in this interval not displayed.    GFR: Estimated Creatinine Clearance: 108 mL/min (by C-G formula based on SCr of 1.03 mg/dL).  Liver Function Tests: Recent Labs  Lab 02/25/21 2349 02/28/21 0259  AST 12* 11*  ALT 7 6  ALKPHOS 84 98  BILITOT 0.5 1.2  PROT 8.1 8.0  ALBUMIN 2.4* 2.3*    CBG: No results for input(s): GLUCAP in the last 168 hours.   Recent Results (from the past 240 hour(s))  Resp Panel by RT-PCR (Flu A&B, Covid) Nasopharyngeal Swab     Status: None   Collection Time: 02/26/21 12:32 AM   Specimen: Nasopharyngeal Swab; Nasopharyngeal(NP) swabs in vial transport medium  Result Value Ref Range Status   SARS Coronavirus 2 by RT PCR NEGATIVE NEGATIVE Final    Comment: (NOTE) SARS-CoV-2 target nucleic acids are NOT DETECTED.  The SARS-CoV-2 RNA is generally detectable in upper respiratory specimens during the acute phase of infection. The lowest concentration of SARS-CoV-2 viral copies this assay can detect is 138 copies/mL. A negative result does not preclude SARS-Cov-2 infection and should not be used as the sole basis for treatment or other patient management decisions. A negative result may occur with  improper specimen collection/handling, submission of specimen other than nasopharyngeal swab, presence of viral mutation(s) within the areas targeted by this assay, and inadequate number of viral copies(<138  copies/mL). A negative result must be combined with clinical observations, patient history, and epidemiological information. The expected result is Negative.  Fact Sheet for Patients:  EntrepreneurPulse.com.au  Fact Sheet for Healthcare Providers:  IncredibleEmployment.be  This test is no t yet approved or cleared by the [place] FDA and  has been authorized for detection and/or diagnosis of SARS-CoV-2 by FDA under an Emergency Use Authorization (EUA). This EUA will remain  in effect (meaning this test can be used) for the duration of the COVID-19 declaration under Section 564(b)(1) of the Act, 21 U.S.C.section 360bbb-3(b)(1), unless the authorization is terminated  or revoked sooner.       Influenza A by PCR NEGATIVE NEGATIVE Final   Influenza B by PCR NEGATIVE NEGATIVE Final    Comment: (NOTE) The Xpert Xpress SARS-CoV-2/FLU/RSV plus assay is intended as an aid in the diagnosis of influenza from Nasopharyngeal swab specimens and should not be used as a sole basis for treatment. Nasal washings and aspirates are unacceptable for Xpert Xpress SARS-CoV-2/FLU/RSV testing.  Fact Sheet for Patients: EntrepreneurPulse.com.au  Fact Sheet for Healthcare Providers: IncredibleEmployment.be  This test is not yet approved or cleared by the [place] FDA and has been authorized for detection and/or diagnosis of SARS-CoV-2 by FDA under an Emergency Use Authorization (EUA). This EUA will remain in effect (meaning this test can be used) for the duration of the COVID-19 declaration under Section 564(b)(1) of the Act, 21 U.S.C. section 360bbb-3(b)(1), unless the authorization is terminated or revoked.  Performed at [facility], [place] [address] [place], [place] [address]   Urine Culture     Status: None   Collection Time: 02/26/21  2:31 AM   Specimen: Urine, Clean Catch  Result Value Ref  Range Status   Specimen Description   Final  URINE, CLEAN CATCH Performed at [facility], [place] [address], [place], [place] [address]    Special Requests   Final    Immunocompromised Performed at [facility], [place] [address] [place], [place] [address]    Culture   Final    NO GROWTH Performed at [facility], [place] [address], [place], [place] [address]    Report Status 02/27/2021 FINAL  Final  Culture, blood (routine x 2)     Status: None   Collection Time: 02/26/21  5:24 AM   Specimen: BLOOD  Result Value Ref Range Status   Specimen Description   Final    BLOOD LEFT ANTECUBITAL Performed at [place] [address], [place], [place] [address]    Special Requests   Final    BOTTLES DRAWN AEROBIC AND ANAEROBIC Blood Culture results may not be optimal due to an inadequate volume of blood received in culture bottles Performed at [place] [address], [place], [place] [address]    Culture   Final    NO GROWTH 5 DAYS Performed at [facility], [place] [address] [place], [place] [address]    Report Status 03/03/2021 FINAL  Final  MRSA Next Gen by PCR, Nasal     Status: None   Collection Time: 02/26/21  5:24 AM   Specimen: Nasal Mucosa; Nasal Swab  Result Value Ref Range Status   MRSA by PCR Next Gen NOT DETECTED NOT DETECTED Final    Comment: (NOTE) The GeneXpert MRSA Assay (FDA approved for NASAL specimens only), is one component of a comprehensive MRSA colonization surveillance program. It is not intended to diagnose MRSA infection nor to guide or monitor treatment for MRSA infections. Test performance is not FDA approved in patients less than 78 years old. Performed at [facility], [place] [address] [place], [place] [address]   MRSA Next Gen by PCR, Nasal     Status: None   Collection Time: 02/26/21  9:11 AM   Specimen: Nasal Mucosa; Nasal Swab  Result  Value Ref Range Status   MRSA by PCR Next Gen NOT DETECTED NOT DETECTED Final    Comment: (NOTE) The GeneXpert MRSA Assay (FDA approved for NASAL specimens only), is one component of a comprehensive MRSA colonization surveillance program. It is not intended to diagnose MRSA infection nor to guide or monitor treatment for MRSA infections. Test performance is not FDA approved in patients less than 73 years old. Performed at [facility], [place] [address] [place], [place] [address]   Culture, blood (routine x 2)     Status: None   Collection Time: 02/26/21 10:42 AM   Specimen: BLOOD LEFT FOREARM  Result Value Ref Range Status   Specimen Description   Final    BLOOD LEFT FOREARM Performed at [place] [address] [place], [place] [address]    Special Requests   Final    BOTTLES DRAWN AEROBIC ONLY Blood Culture adequate volume Performed at [place] [address], [place], [place] [address]    Culture   Final    NO GROWTH 5 DAYS Performed at [facility], [place] [address], [place],  [address]    Report Status 03/03/2021 FINAL  Final         Radiology Studies: No results found.      Scheduled Meds:  abacavir-dolutegravir-lamiVUDine  1 tablet Oral Daily   amLODipine  10 mg Oral Daily   apixaban  5 mg Oral BID   Chlorhexidine Gluconate Cloth  6 each Topical Daily   guaiFENesin  5 mL Oral TID   pantoprazole  40 mg Oral Daily   sodium bicarbonate  650 mg Oral BID   Continuous Infusions:  sodium chloride Stopped (03/04/21 0041)     LOS: 6 days       Hosie Poisson, MD [facility]   To contact the attending provider between 7A-7P or the covering provider during after hours 7P-7A, please log into the web site [email or web address] and access using universal [place] password for that web site. If you do not have the password, please call the hospital operator.  03/04/2021, 2:21 PM

## 2021-03-05 LAB — CBC
HCT: 24.8 % — ABNORMAL LOW (ref 39.0–52.0)
Hemoglobin: 7.5 g/dL — ABNORMAL LOW (ref 13.0–17.0)
MCH: 27.4 pg (ref 26.0–34.0)
MCHC: 30.2 g/dL (ref 30.0–36.0)
MCV: 90.5 fL (ref 80.0–100.0)
Platelets: 363 10*3/uL (ref 150–400)
RBC: 2.74 MIL/uL — ABNORMAL LOW (ref 4.22–5.81)
RDW: 17.3 % — ABNORMAL HIGH (ref 11.5–15.5)
WBC: 10.2 10*3/uL (ref 4.0–10.5)
nRBC: 0 % (ref 0.0–0.2)

## 2021-03-05 LAB — BASIC METABOLIC PANEL
Anion gap: 8 (ref 5–15)
BUN: 11 mg/dL (ref 6–20)
CO2: 21 mmol/L — ABNORMAL LOW (ref 22–32)
Calcium: 8.6 mg/dL — ABNORMAL LOW (ref 8.9–10.3)
Chloride: 105 mmol/L (ref 98–111)
Creatinine, Ser: 0.88 mg/dL (ref 0.61–1.24)
GFR, Estimated: >60 mL/min (ref >60)
Glucose, Bld: 124 mg/dL — ABNORMAL HIGH (ref 70–99)
Potassium: 3.2 mmol/L — ABNORMAL LOW (ref 3.5–5.1)
Sodium: 134 mmol/L — ABNORMAL LOW (ref 135–145)

## 2021-03-05 LAB — CYTOLOGY - NON PAP

## 2021-03-05 LAB — PATHOLOGIST SMEAR REVIEW

## 2021-03-05 MED ORDER — POTASSIUM CHLORIDE CRYS ER 20 MEQ PO TBCR
40.0000 meq | EXTENDED_RELEASE_TABLET | Freq: Two times a day (BID) | ORAL | Status: AC
  Administered 2021-03-05 (×2): 40 meq via ORAL
  Filled 2021-03-05 (×2): qty 2

## 2021-03-05 NOTE — Progress Notes (Addendum)
Physical Therapy Treatment Patient Details Name: Leon Nichols MRN: [id number] DOB: 02-26-1966 Today's Date: 03/05/2021   History of Present Illness 55 yo male presenting to ED on 11/24 with dyspnea, R lower chest pain, and BLE edema. CTA chest also showed right lower lobe consolidation and right pleural effusion in addition to the pulmonary emboli. Image showing BLE DVTs. S/p IVC filter placement on 11/25. S/p EGD and colonoscopy for GI bleed on 11/26. PMH including HIV, HTN, perianal abscess, perirectal abscess, R knee arthroscopy, and shingles.    PT Comments    Pt is progressing well with mobility, he ambulated 250' without an assistive device, had 2 mild stumbles but was able to self correct with no overt loss of balance. SpO2 94-98% on room air walking, HR 112 max. Instructed pt in seated BLE strengthening exercises. Due to significant progress in activity tolerance, I do not feel he needs ST-SNF. I expect he can safely DC to his boarding house where he has 1 flight of stairs to get to his room. He would benefit from a cane to improve balance when walking. Prior to admission, pt was driving to get his own groceries and was independent with mobility.    Recommendations for follow up therapy are one component of a multi-disciplinary discharge planning process, led by the attending physician.  Recommendations may be updated based on patient status, additional functional criteria and insurance authorization.  Follow Up Recommendations  No PT follow up     Assistance Recommended at Discharge    Equipment Recommendations  Cane    Recommendations for Other Services       Precautions / Restrictions Precautions Precautions: Fall;Other (comment) Precaution Comments: Watch SPO2 Restrictions Weight Bearing Restrictions: No     Mobility  Bed Mobility Overal bed mobility: Modified Independent             General bed mobility comments: used bedrail    Transfers Overall transfer  level: Needs assistance   Transfers: Sit to/from Stand Sit to Stand: Supervision           General transfer comment: steady, no loss of balance    Ambulation/Gait Ambulation/Gait assistance: Min guard Gait Distance (Feet): 250 Feet Assistive device: None Gait Pattern/deviations: WFL(Within Functional Limits) Gait velocity: decr     General Gait Details: mild stumble but self corrected without overt loss of balance x 2; HR 112 walking, SpO2 94-98% on room air walking   Stairs             Wheelchair Mobility    Modified Rankin (Stroke Patients Only)       Balance Overall balance assessment: Mild deficits observed, not formally tested                                          Cognition Arousal/Alertness: Awake/alert Behavior During Therapy: WFL for tasks assessed/performed Overall Cognitive Status: Within Functional Limits for tasks assessed                                          Exercises General Exercises - Lower Extremity Ankle Circles/Pumps: AROM;Both;10 reps;Seated Long Arc Quad: AROM;Both;10 reps;Seated Hip Flexion/Marching: AROM;Both;10 reps;Seated    General Comments        Pertinent Vitals/Pain Pain Assessment: No/denies pain    Home Living  Prior Function            PT Goals (current goals can now be found in the care plan section) Acute Rehab PT Goals Patient Stated Goal: go home (boarding house) PT Goal Formulation: With patient Time For Goal Achievement: 03/17/21 Potential to Achieve Goals: Good Progress towards PT goals: Progressing toward goals    Frequency    Min 3X/week      PT Plan Current plan remains appropriate    Co-evaluation              AM-PAC PT "6 Clicks" Mobility   Outcome Measure  Help needed turning from your back to your side while in a flat bed without using bedrails?: None Help needed moving from lying on your back to  sitting on the side of a flat bed without using bedrails?: None Help needed moving to and from a bed to a chair (including a wheelchair)?: None Help needed standing up from a chair using your arms (e.g., wheelchair or bedside chair)?: None Help needed to walk in hospital room?: None Help needed climbing 3-5 steps with a railing? : None 6 Click Score: 24    End of Session   Activity Tolerance: Patient tolerated treatment well Patient left: in chair;with call bell/phone within reach Nurse Communication: Mobility status PT Visit Diagnosis: Unsteadiness on feet (R26.81)     Time: 9983-3825 PT Time Calculation (min) (ACUTE ONLY): 17 min  Charges:  $Gait Training: 8-22 mins                     Ralene Bathe Kistler PT 03/05/2021  Acute Rehabilitation Services Pager [phone] Office [phone]

## 2021-03-05 NOTE — TOC Initial Note (Addendum)
Transition of Care [facility]) - Initial/Assessment Note    Patient Details  Name: Leon Nichols MRN: [id number] Date of Birth: Nov 10, 1965  [facility] of [facility]) CM/SW Contact:    Leon Mage, LCSW Phone Number: 03/05/2021, 8:22 AM  Clinical Narrative:    Patient seen in follow up to PT recommendation of [place] [facility].   Leon Nichols has been living in a boarding house here in [place] for the past several months.  He has been on disability for about 3 years, so is on fixed income and, as such, has had a hard time finding affordable housing.  He is interested in [facility] services, but mainly because he was informed that he could be assigned a Education officer, museum, and he would like to work with them on the housing issues.  He has three daughters in the area but requests that I not call them.  No reason given.  He did give me permission to call his sister. Left message. He has a car and drives himself to medical appointments.  Takes care of colostomy himself and is able to afford all related supplies, as well as medications.  Cindie with [facility] agrees to provide [facility] services.   TOC will continue to follow during the course of hospitalization.  Addendum:  Sister called back.  She states that patient has basically been estranged from all family members due to "lifestyle choices" and so actually has no family support at home, which is in conflict to what he told PT.  After we spoke, she called him about her concerns with his plan given he lives on second floor, and he agreed to consider going to SNF.  Alerted PT to see him through that lens when they work with him today.         Expected Discharge Plan: [place] Barriers to Discharge: No Barriers Identified   Patient Goals and CMS Choice     Choice offered to / list presented to : Patient  Expected Discharge Plan and Services Expected Discharge Plan: [place]   Discharge Planning Services: CM Consult Post Acute Care Choice:  [place] arrangements for the past 2 months: [place]                                      Prior Living Arrangements/Services Living arrangements for the past 2 months: [place] with:: Self Patient language and need for interpreter reviewed:: Yes        Need for Family Participation in Patient Care: Yes (Comment) Care giver support system in place?: Yes (comment)   Criminal Activity/Legal Involvement Pertinent to Current Situation/Hospitalization: No - Comment as needed  Activities of Daily Living Home Assistive Devices/Equipment: None ADL Screening (condition at time of admission) Patient's cognitive ability adequate to safely complete daily activities?: Yes Is the patient deaf or have difficulty hearing?: No Does the patient have difficulty seeing, even when wearing glasses/contacts?: No Does the patient have difficulty concentrating, remembering, or making decisions?: No Patient able to express need for assistance with ADLs?: Yes Does the patient have difficulty dressing or bathing?: No Independently performs ADLs?: Yes (appropriate for developmental age) Does the patient have difficulty walking or climbing stairs?: No Weakness of Legs: None Weakness of Arms/Hands: None  Permission Sought/Granted Permission sought to share information with : Family Supports Permission granted to share information with : Yes, Verbal  Permission Granted  Share Information with NAME: Leon Nichols [phone]           Emotional Assessment Appearance:: Appears older than stated age Attitude/Demeanor/Rapport: Engaged Affect (typically observed): Appropriate Orientation: : Oriented to Self, Oriented to Place, Oriented to  Time, Oriented to Situation Alcohol / Substance Use: Not Applicable Psych Involvement: No (comment)  Admission diagnosis:  Peripheral edema [R60.9] Heme positive stool [R19.5] Pulmonary embolism on left [facility]) [I26.99] Pulmonary  embolism on right (HCC) [I26.99] AKI (acute kidney injury) (HCC) [N17.9] Symptomatic anemia [D64.9] Other acute pulmonary embolism with acute cor pulmonale (HCC) [I26.09] Acute pulmonary embolism without acute cor pulmonale (HCC) [I26.99] Patient Active Problem List   Diagnosis Date Noted   Gross hematuria    Symptomatic anemia    Hypertension    Community acquired pneumonia    Acute pulmonary embolism (HCC) 02/26/2021   Acute DVT (deep venous thrombosis) (HCC) 02/26/2021   Occult blood in stools 02/26/2021   Pyuria, sterile 02/26/2021   Acute pulmonary embolism without acute cor pulmonale (HCC) 02/26/2021   Alcohol dependency (HCC) 02/20/2019   Chronic otitis externa of left ear 06/30/2018   Headache 10/15/2015   Altered bowel elimination due to intestinal ostomy (HCC) 02/04/2014   Acute kidney injury (HCC) 02/02/2014   Neuralgia, post-herpetic 01/02/2014   HIV disease (HCC) 03/24/2012   Anemia 03/23/2012   Essential hypertension 03/29/2007   PCP:  Pcp, No Pharmacy:   [facility] [phone] - [place], [place] - [phone] [facility] [address] AT [facility] [address] [place] [address] Phone: [phone] Fax: [phone]     Social Determinants of Health (SDOH) Interventions    Readmission Risk Interventions No flowsheet data found.

## 2021-03-05 NOTE — Progress Notes (Signed)
PROGRESS NOTE    Leon Nichols  [id number] DOB: [birth year] DOA: 02/25/2021 PCP: Merryl Hacker, No    Chief Complaint  Patient presents with   Shortness of Breath    Brief Narrative:   55 year old male with prior h/o of HIV, HTN, EtOH, colostomy, presenting on 11/23 with lower extremity swelling and dyspnea and pleuritic right chest pain. Here found to have b/l PEs with reduced RV function on TTE, also hemoglobin in 4s from normal in February of this year. No report of melena or hematochezia but does report one month of intermittent gross blood in urine for about a month.  Patient transfused a total of 4 units packed red blood cells.  GI consulted and patient underwent upper endoscopy with no source of bleeding noted, colonoscopy was attempted but due to poor prep may need to be repeated.  Patient seen by urology due to complaints of hematuria and work-up underway.   Assessment & Plan:   Principal Problem:   Acute pulmonary embolism (HCC) Active Problems:   Anemia   HIV disease ([place])   Acute kidney injury ([place])   Acute DVT (deep venous thrombosis) (HCC)   Occult blood in stools   Pyuria, sterile   Acute pulmonary embolism without acute cor pulmonale (HCC)   Gross hematuria   Symptomatic anemia   Hypertension   Community acquired pneumonia   Acute respiratory failure with hypoxia/ bilateral PE:  Sec to symptomatic acute bilateral PE, with evidence of right ventricular strain.  - 2 D echo shows the same.  LE duplex neg for DVT.  -IVC filter placed by IR on 02/27/21. - -Dr. Si Raider, discussed with IR and was wondering whether thrombectomy or lysis may be helpful however per IR due to location of PE not suitable for thrombectomy/lysis. -Patient has been weaned off oxygen - started on heparin, transitioned to lovenox and to eliquis today.    Hematuria:  - Urology consulted, underwent CT abd and pelvis with no hydronephrosis. No mass lesions or filling defects.  - US RENAL is negative  for acute abn.  Hematuria has resolved   Symptomatic anemia: FOBT: - s/p 4 units of pRBC transfusion.  -GI consulted, underwent EGD without any source of bleeding.  - colonoscopy done on 11/26 was poor prep, GI to reassess and tentatively plan for repeat colonoscopy.  - hemoglobin stable around 7 the last 48 hours.  - continue to monitor.  -Hemoglobin has been stable around 7    Hypertension:  Well controlled.  No changes in medications blood pressure parameters are optimal   HIV: Resume home meds.  Recommend outpatient follow-up with ID   Pulm infiltrate/ CAP:  - Completed the course of IV antibiotics.  Urine for legionella and pneumococcal antigen is negative.  MRSA PCR is negative.    Patient reports that he has stairs at home and wants to work with PT here on the stairs before being discharged home.  Currently waiting for therapy evaluations.    DVT prophylaxis: Eliquis  Code Status:  full code.  Family Communication: none at bedside.  Disposition:   Status is: Inpatient  Remains inpatient appropriate because: IV antibiotics.        Consultants:  Interventional radiology Urology: Dr. Abner Greenspan 02/28/2021 Gastroenterology: Dr. Alessandra Bevels 02/26/2021 PCCM admission: Dr.Jeong    Procedures:  CT angiogram chest 02/26/2021 Chest x-ray 02/25/2021 Ultrasound-guided for vascular access/IVC catheterization and venogram/IVC filter placement--per IR, Dr.Mugweru 02/27/2021 Renal ultrasound 02/28/2021 2D echo 02/26/2021 Lower extremity Dopplers 02/26/2021 CT hematuria work-up  pending Colonoscopy 02/28/2021-Per Dr. Paulita Fujita  Colonoscopy 02/28/2021-Per Dr. Paulita Fujita Transfuse 4 units packed red blood cells 02/26/2021 CT abdomen and pelvis 03/02/2021    Antimicrobials:  Antibiotics Given (last 72 hours)     Date/Time Action Medication Dose Rate   03/03/21 0537 New Bag/Given   cefTRIAXone (ROCEPHIN) 2 g in sodium chloride 0.9 % 100 mL IVPB 2 g 200 mL/hr   03/03/21 1004  Given   abacavir-dolutegravir-lamiVUDine (TRIUMEQ) 600-50-300 MG per tablet 1 tablet 1 tablet    03/04/21 0550 New Bag/Given   cefTRIAXone (ROCEPHIN) 2 g in sodium chloride 0.9 % 100 mL IVPB 2 g 200 mL/hr   03/04/21 0914 Given   abacavir-dolutegravir-lamiVUDine (TRIUMEQ) 600-50-300 MG per tablet 1 tablet 1 tablet    03/05/21 1058 Given   abacavir-dolutegravir-lamiVUDine (TRIUMEQ) 600-50-300 MG per tablet 1 tablet 1 tablet          Subjective:  Breathing has improved, no chest pain. No nausea vomiting No rectal bleeding seen. Objective: Vitals:   03/04/21 1732 03/04/21 2129 03/05/21 0649 03/05/21 1308  BP: 123/78 (!) 161/97 (!) 143/94 140/87  Pulse: (!) 106 (!) 107 (!) 101 (!) 106  Resp: 16 18 18    Temp: 98.9 F (37.2 C) 99.4 F (37.4 C) 98.5 F (36.9 C) 98.2 F (36.8 C)  TempSrc: Oral Oral Oral Oral  SpO2: 99% 92% 97% 97%  Weight:      Height:        Intake/Output Summary (Last 24 hours) at 03/05/2021 1351 Last data filed at 03/05/2021 1300 Gross per 24 hour  Intake 1200 ml  Output 2240 ml  Net -1040 ml    Filed Weights   03/02/21 0500 03/03/21 0500 03/04/21 0500  Weight: 110.2 kg 110 kg 110.3 kg    Examination:  General exam: Appears calm and comfortable  Respiratory system: Clear to auscultation. Respiratory effort normal. Cardiovascular system: S1 & S2 heard, RRR. No JVD, .  Pedal edema present Gastrointestinal system: Abdomen is nondistended, soft and nontender.  Normal bowel sounds heard. Central nervous system: Alert and oriented. No focal neurological deficits. Extremities: Symmetric 5 x 5 power. Skin: No rashes, lesions or ulcers Psychiatry: Mood & affect appropriate.      Data Reviewed: I have personally reviewed following labs and imaging studies  CBC: Recent Labs  Lab 02/27/21 0426 02/27/21 0646 02/27/21 1545 03/01/21 0527 03/02/21 0550 03/03/21 0255 03/04/21 0313 03/05/21 0404  WBC 9.9 13.3*   < > 13.7* 13.8* 12.2* 11.0* 10.2   NEUTROABS 7.0 10.3*  --   --   --   --   --   --   HGB 11.5* 7.7*   < > 7.7* 8.1* 7.7* 7.1* 7.5*  HCT 40.1 24.8*   < > 25.8* 26.0* 26.1* 24.4* 24.8*  MCV 98.3 91.9   < > 93.5 92.2 94.6 93.1 90.5  PLT 296 470*   < > 359 367 355 353 363   < > = values in this interval not displayed.     Basic Metabolic Panel: Recent Labs  Lab 03/01/21 0527 03/02/21 0550 03/03/21 0255 03/04/21 0313 03/05/21 0404  NA 133* 134* 133* 129* 134*  K 4.6 4.3 4.0 3.7 3.2*  CL 109 108 108 103 105  CO2 18* 18* 18* 18* 21*  GLUCOSE 106* 103* 108* 112* 124*  BUN 14 13 12 14 11   CREATININE 1.17 1.00 0.93 1.03 0.88  CALCIUM 8.6* 8.7* 8.8* 8.6* 8.6*  MG  --   --  1.8  --   --  GFR: Estimated Creatinine Clearance: 126.5 mL/min (by C-G formula based on SCr of 0.88 mg/dL).  Liver Function Tests: Recent Labs  Lab 02/28/21 0259  AST 11*  ALT 6  ALKPHOS 98  BILITOT 1.2  PROT 8.0  ALBUMIN 2.3*     CBG: No results for input(s): GLUCAP in the last 168 hours.   Recent Results (from the past 240 hour(s))  Resp Panel by RT-PCR (Flu A&B, Covid) Nasopharyngeal Swab     Status: None   Collection Time: 02/26/21 12:32 AM   Specimen: Nasopharyngeal Swab; Nasopharyngeal(NP) swabs in vial transport medium  Result Value Ref Range Status   SARS Coronavirus 2 by RT PCR NEGATIVE NEGATIVE Final    Comment: (NOTE) SARS-CoV-2 target nucleic acids are NOT DETECTED.  The SARS-CoV-2 RNA is generally detectable in upper respiratory specimens during the acute phase of infection. The lowest concentration of SARS-CoV-2 viral copies this assay can detect is 138 copies/mL. A negative result does not preclude SARS-Cov-2 infection and should not be used as the sole basis for treatment or other patient management decisions. A negative result may occur with  improper specimen collection/handling, submission of specimen other than nasopharyngeal swab, presence of viral mutation(s) within the areas targeted by this  assay, and inadequate number of viral copies(<138 copies/mL). A negative result must be combined with clinical observations, patient history, and epidemiological information. The expected result is Negative.  Fact Sheet for Patients:  EntrepreneurPulse.com.au  Fact Sheet for Healthcare Providers:  IncredibleEmployment.be  This test is no t yet approved or cleared by the [place] FDA and  has been authorized for detection and/or diagnosis of SARS-CoV-2 by FDA under an Emergency Use Authorization (EUA). This EUA will remain  in effect (meaning this test can be used) for the duration of the COVID-19 declaration under Section 564(b)(1) of the Act, 21 U.S.C.section 360bbb-3(b)(1), unless the authorization is terminated  or revoked sooner.       Influenza A by PCR NEGATIVE NEGATIVE Final   Influenza B by PCR NEGATIVE NEGATIVE Final    Comment: (NOTE) The Xpert Xpress SARS-CoV-2/FLU/RSV plus assay is intended as an aid in the diagnosis of influenza from Nasopharyngeal swab specimens and should not be used as a sole basis for treatment. Nasal washings and aspirates are unacceptable for Xpert Xpress SARS-CoV-2/FLU/RSV testing.  Fact Sheet for Patients: EntrepreneurPulse.com.au  Fact Sheet for Healthcare Providers: IncredibleEmployment.be  This test is not yet approved or cleared by the [place] FDA and has been authorized for detection and/or diagnosis of SARS-CoV-2 by FDA under an Emergency Use Authorization (EUA). This EUA will remain in effect (meaning this test can be used) for the duration of the COVID-19 declaration under Section 564(b)(1) of the Act, 21 U.S.C. section 360bbb-3(b)(1), unless the authorization is terminated or revoked.  Performed at [facility], [place] [address] [place], [place] [address]   Urine Culture     Status: None   Collection Time: 02/26/21  2:31 AM    Specimen: Urine, Clean Catch  Result Value Ref Range Status   Specimen Description   Final    URINE, CLEAN CATCH Performed at [facility], [place] [address] [place], [place] [address]    Special Requests   Final    Immunocompromised Performed at [facility], [place] [address] [place], [place] [address]    Culture   Final    NO GROWTH Performed at [facility], [place] [address], [place], [place] [address]  Report Status 02/27/2021 FINAL  Final  Culture, blood (routine x 2)     Status: None   Collection Time: 02/26/21  5:24 AM   Specimen: BLOOD  Result Value Ref Range Status   Specimen Description   Final    BLOOD LEFT ANTECUBITAL Performed at [facility], [address], [place], [place] [address]    Special Requests   Final    BOTTLES DRAWN AEROBIC AND ANAEROBIC Blood Culture results may not be optimal due to an inadequate volume of blood received in culture bottles Performed at [facility], [address] [place], [place] [address]    Culture   Final    NO GROWTH 5 DAYS Performed at [facility], [address], [place], [place] [address]    Report Status 03/03/2021 FINAL  Final  MRSA Next Gen by PCR, Nasal     Status: None   Collection Time: 02/26/21  5:24 AM   Specimen: Nasal Mucosa; Nasal Swab  Result Value Ref Range Status   MRSA by PCR Next Gen NOT DETECTED NOT DETECTED Final    Comment: (NOTE) The GeneXpert MRSA Assay (FDA approved for NASAL specimens only), is one component of a comprehensive MRSA colonization surveillance program. It is not intended to diagnose MRSA infection nor to guide or monitor treatment for MRSA infections. Test performance is not FDA approved in patients less than 23 years old. Performed at [facility], [address], [place], [place] [address]   MRSA Next Gen by PCR, Nasal     Status: None   Collection Time: 02/26/21  9:11 AM    Specimen: Nasal Mucosa; Nasal Swab  Result Value Ref Range Status   MRSA by PCR Next Gen NOT DETECTED NOT DETECTED Final    Comment: (NOTE) The GeneXpert MRSA Assay (FDA approved for NASAL specimens only), is one component of a comprehensive MRSA colonization surveillance program. It is not intended to diagnose MRSA infection nor to guide or monitor treatment for MRSA infections. Test performance is not FDA approved in patients less than 25 years old. Performed at [facility], [address], [place], [place] [address]   Culture, blood (routine x 2)     Status: None   Collection Time: 02/26/21 10:42 AM   Specimen: BLOOD LEFT FOREARM  Result Value Ref Range Status   Specimen Description   Final    BLOOD LEFT FOREARM Performed at [facility], [address], [place], [place] [address]    Special Requests   Final    BOTTLES DRAWN AEROBIC ONLY Blood Culture adequate volume Performed at [facility], [address] [place], [place] [address]    Culture   Final    NO GROWTH 5 DAYS Performed at [facility], [address] [place], [place] [address]    Report Status 03/03/2021 FINAL  Final          Radiology Studies: No results found.      Scheduled Meds:  abacavir-dolutegravir-lamiVUDine  1 tablet Oral Daily   amLODipine  10 mg Oral Daily   apixaban  5 mg Oral BID   Chlorhexidine Gluconate Cloth  6 each Topical Daily   guaiFENesin  5 mL Oral TID   pantoprazole  40 mg Oral Daily   potassium chloride  40 mEq Oral BID   Continuous Infusions:  sodium chloride Stopped (03/04/21 0041)     LOS: 7 days  Hosie Poisson, MD [facility]   To contact the attending provider between 7A-7P or the covering provider during after hours 7P-7A, please log into the web site [email or web address] and access using universal [place] password for that web site. If you do not have the password, please call  the hospital operator.  03/05/2021, 1:51 PM

## 2021-03-06 MED ORDER — TRAMADOL HCL 50 MG PO TABS: 50.0000 mg | ORAL_TABLET | Freq: Two times a day (BID) | ORAL | 0 refills | Status: AC | PRN

## 2021-03-06 MED ORDER — DOCUSATE SODIUM 100 MG PO CAPS
100.0000 mg | ORAL_CAPSULE | Freq: Two times a day (BID) | ORAL | 0 refills | Status: DC | PRN
Start: 2021-03-06 — End: 2021-04-07

## 2021-03-06 MED ORDER — PANTOPRAZOLE SODIUM 40 MG PO TBEC
40.0000 mg | DELAYED_RELEASE_TABLET | Freq: Every day | ORAL | 1 refills | Status: DC
Start: 2021-03-07 — End: 2021-12-15

## 2021-03-06 MED ORDER — APIXABAN 5 MG PO TABS: 5.0000 mg | ORAL_TABLET | Freq: Two times a day (BID) | ORAL | 2 refills | Status: DC

## 2021-03-06 MED ORDER — HYDROCOD POLST-CPM POLST ER 10-8 MG/5ML PO SUER: 5.0000 mL | Freq: Two times a day (BID) | ORAL | 0 refills | Status: DC | PRN

## 2021-03-06 NOTE — Discharge Summary (Signed)
Physician Discharge Summary  Leon Nichols ZDG:[id number] DOB: 21-Feb-1966 DOA: 02/25/2021  PCP: Oneita HurtPcp, No  Admit date: 02/25/2021 Discharge date: 03/06/2021  Admitted From: Home.  Disposition:  Home.   Recommendations for Outpatient Follow-up:  Follow up with PCP in 1-2 weeks Please obtain BMP/CBC in one week Please follow up with gastroenterology for colonoscopy Please follow up with Urology as scheduled.   Home Health:YES  Discharge Condition:stable.  CODE STATUS:Full code.  Diet recommendation: Heart Healthy   Brief/Interim Summary: 55 year old male with prior h/o of HIV, HTN, EtOH, colostomy, presenting on 11/23 with lower extremity swelling and dyspnea and pleuritic right chest pain. Here found to have b/l PEs with reduced RV function on TTE, also hemoglobin in 4s from normal in February of this year. No report of melena or hematochezia but does report one month of intermittent gross blood in urine for about a month.  Patient transfused a total of 4 units packed red blood cells.  GI consulted and patient underwent upper endoscopy with no source of bleeding noted, colonoscopy was attempted but due to poor prep may need to be repeated.  Patient seen by urology due to complaints of hematuria and work-up underway.  Discharge Diagnoses:  Principal Problem:   Acute pulmonary embolism (HCC) Active Problems:   Anemia   HIV disease (HCC)   Acute kidney injury (HCC)   Acute DVT (deep venous thrombosis) (HCC)   Occult blood in stools   Pyuria, sterile   Acute pulmonary embolism without acute cor pulmonale (HCC)   Gross hematuria   Symptomatic anemia   Hypertension   Community acquired pneumonia  Acute respiratory failure with hypoxia/ bilateral PE:  Sec to symptomatic acute bilateral PE, with evidence of right ventricular strain.  - 2 D echo shows the same.  LE duplex neg for DVT.  -IVC filter placed by IR on 02/27/21. - -Dr. Ashok PallWouk, discussed with IR and was wondering whether  thrombectomy or lysis may be helpful however per IR due to location of PE not suitable for thrombectomy/lysis. -Patient has been weaned off oxygen - started on heparin, transitioned to lovenox and to eliquis today.      Hematuria:  - Urology consulted, underwent CT abd and pelvis with no hydronephrosis. No mass lesions or filling defects.  - US RENAL is negative for acute abn.  Hematuria has resolved     Symptomatic anemia: FOBT: - s/p 4 units of pRBC transfusion.  -GI consulted, underwent EGD without any source of bleeding.  - colonoscopy done on 11/26 was poor prep, GI to reassess and tentatively plan for repeat colonoscopy.  - hemoglobin stable around 7 the last 48 hours.  - continue to monitor.  -Hemoglobin has been stable around 7       Hypertension:  Well controlled.  No changes in medications blood pressure parameters are optimal     HIV: Resume home meds.  Recommend outpatient follow-up with ID     Pulm infiltrate/ CAP:  - Completed the course of IV antibiotics.  Urine for legionella and pneumococcal antigen is negative.  MRSA PCR is negative.      Patient reports that he has stairs at home and wants to work with PT here on the stairs before being discharged home.        Discharge Instructions  Discharge Instructions     Diet - low sodium heart healthy   Complete by: As directed    Discharge instructions   Complete by: As directed  Please follow up with PCP in one week. Please follow up with gastroenterology for colonoscopy as outpatient.      Allergies as of 03/06/2021   No Known Allergies      Medication List     STOP taking these medications    lisinopril 20 MG tablet Commonly known as: ZESTRIL       TAKE these medications    amLODipine 10 MG tablet Commonly known as: NORVASC TAKE 1 TABLET(10 MG) BY MOUTH DAILY What changed: See the new instructions.   apixaban 5 MG Tabs tablet Commonly known as: ELIQUIS Take 1 tablet (5 mg  total) by mouth 2 (two) times daily.   chlorpheniramine-HYDROcodone 10-8 MG/5ML Suer Commonly known as: TUSSIONEX Take 5 mLs by mouth every 12 (twelve) hours as needed for cough.   docusate sodium 100 MG capsule Commonly known as: COLACE Take 1 capsule (100 mg total) by mouth 2 (two) times daily as needed for mild constipation.   pantoprazole 40 MG tablet Commonly known as: PROTONIX Take 1 tablet (40 mg total) by mouth daily. Start taking on: March 07, 2021   traMADol 50 MG tablet Commonly known as: ULTRAM Take 1 tablet (50 mg total) by mouth every 12 (twelve) hours as needed for up to 3 days for moderate pain (pain with cough.).   Triumeq 600-50-300 MG tablet Generic drug: abacavir-dolutegravir-lamiVUDine Take 1 tablet by mouth daily.        Follow-up [facility].   Specialty: Internal Medicine Why: This is the pace I was telling you about that you could check to see if they are in-network with your insurance Contact information: [facility].   Why: Your current PCP clinic Contact information: [address], [place], [place] [address] Phone: [phone]        Care, [facility] Follow up.   Specialty: [place] Why: They will contact about setting up a time to come out and work with you Contact information: [address] [place] [address] [phone]                No Known Allergies  Consultations: Urology Gastroenterology    Procedures/Studies: DG Chest 2 View  Result Date: 02/25/2021 CLINICAL DATA:  Shortness of breath EXAM: CHEST - 2 VIEW COMPARISON:  03/29/2012 FINDINGS: Ground-glass airspace disease at the right base suspicious for pneumonia. Borderline cardiomegaly. No significant effusion. No pneumothorax IMPRESSION: Findings suspicious for right lower lobe pneumonia  Electronically Signed   By: Donavan Foil M.D.   On: 02/25/2021 23:42   CT Angio Chest PE W and/or Wo Contrast  Addendum Date: 02/26/2021   ADDENDUM REPORT: 02/26/2021 02:31 ADDENDUM: Critical findings were reported to Dr. Florina Ou at 2:25 a.m. Electronically Signed   By: Brett Fairy M.D.   On: 02/26/2021 02:31   Result Date: 02/26/2021 CLINICAL DATA:  Positive D-dimer, PE suspected, low to intermediate probability. Shortness of breath, cough, lower extremity edema for 2 weeks. EXAM: CT ANGIOGRAPHY CHEST WITH CONTRAST TECHNIQUE: Multidetector CT imaging of the chest was performed using the standard protocol during bolus administration of intravenous contrast. Multiplanar CT image reconstructions and MIPs were obtained to evaluate the vascular anatomy. CONTRAST:  72mL OMNIPAQUE IOHEXOL 350 MG/ML SOLN COMPARISON:  02/25/2021. FINDINGS: Cardiovascular: The heart is normal in size and there is no pericardial effusion. No  aortic aneurysm is seen. The pulmonary trunk is distended which may be associated with underlying pulmonary artery hypertension. There is a pulmonary artery filling defect in the right lower lobe lobar artery. Scattered segmental and subsegmental pulmonary artery filling defects are noted in the right upper lobe and left lower lobe. Examination is limited due to mixing artifact and respiratory motion. The right ventricle is mildly distended suggesting underlying right heart strain. Mediastinum/Nodes: Prominent lymph nodes are present at the right hilum. Shotty lymph nodes are seen in the mediastinum. No axillary or left hilar lymphadenopathy. The thyroid gland, trachea, and esophagus are within normal limits. Lungs/Pleura: Consolidation is noted in the right lower lobe in the region of pulmonary embolism, possible pneumonia versus pulmonary infarct. There is a vague ground-glass opacity in the left upper lobe measuring 1.5 cm. There is a small right pleural effusion. No pneumothorax is seen.  Upper Abdomen: No acute abnormality. Musculoskeletal: Mild degenerative changes in the thoracic spine. No acute osseous abnormality is seen. Mild gynecomastia is noted bilaterally. Review of the MIP images confirms the above findings. IMPRESSION: 1. Bilateral pulmonary emboli, the largest in the right lower lobe. The right ventricle is distended suggesting underlying right heart strain. 2. Right lower lobe consolidation in the region of pulmonary embolism in the right lower lobe and ground-glass opacity in the left upper lobe, which may represent infection versus pulmonary infarct. 3. Small right pleural effusion. Electronically Signed: By: Brett Fairy M.D. On: 02/26/2021 02:23   IR IVC FILTER PLMT / S&I Burke Keels GUID/MOD SED  Result Date: 02/27/2021 INDICATION: Acute PE.  GI bleed, contraindicating anticoagulation EXAM: ULTRASOUND GUIDANCE FOR VASCULAR ACCESS IVC CATHETERIZATION AND VENOGRAM INFERIOR VENA CAVA FILTER PLACEMENT MEDICATIONS: None. ANESTHESIA/SEDATION: Fentanyl 2 mcg IV; Versed 100 mg IV Sedation Time: 23 minutes; The patient was continuously monitored during the procedure by the interventional radiology nurse under my direct supervision. CONTRAST:  45 mL Omnipaque 300 FLUOROSCOPY TIME:  1 minutes 42 seconds (57 mGy) COMPLICATIONS: None immediate. PROCEDURE: Informed written consent was obtained from the patient following explanation of the procedure, risks, benefits and alternatives. A time out was performed prior to the initiation of the procedure. Maximal barrier sterile technique utilized including caps, mask, sterile gowns, sterile gloves, large sterile drape, hand hygiene, and Betadine prep. Under sterile condition and local anesthesia, right common femoral venous access was performed with ultrasound. An ultrasound image was saved and sent to PACS. Over a guidewire, the IVC filter delivery sheath and inner dilator were advanced into the IVC just above the IVC bifurcation. Contrast injection  was performed for an IVC venogram. Through the delivery sheath, a retrievable Denali IVC filter was deployed below the level of the renal veins and above the IVC bifurcation. Limited post deployment venacavagram was performed. The delivery sheath was removed and hemostasis was obtained with manual compression. A dressing was placed. The patient tolerated the procedure well without immediate post procedural complication. FINDINGS: The IVC is patent. No evidence of thrombus, stenosis, or occlusion. No variant venous anatomy. Successful placement of the IVC filter below the level of the renal veins. IMPRESSION: Successful placement of a retrievable infrarenal IVC filter, as above. PLAN: IVC filters can cause complications when left in place for extended periods of time. If medically appropriate, recommend discontinuing filter prior to discharge. Please re-evaluate the patient for filter discontinuation when they are seen in follow up, and refer patient to Interventional Radiology for removal. Michaelle Birks, MD [facility] Electronically Signed  By: Michaelle Birks M.D.   On: 02/27/2021 18:57   US RENAL  Result Date: 02/28/2021 CLINICAL DATA:  Gross hematuria, history HIV, hypertension EXAM: RENAL / URINARY TRACT ULTRASOUND COMPLETE COMPARISON:  CT abdomen and pelvis 10/25/2017 FINDINGS: Right Kidney: Renal measurements: 12.7 x 6.5 x 6.6 cm = volume: 284 mL. Normal cortical thickness. Upper normal cortical echogenicity. No mass, hydronephrosis, or shadowing calcification. Left Kidney: Renal measurements: 12.4 x 7.4 x 7.1 cm = volume: 340 mL. Normal cortical thickness. Upper normal cortical echogenicity. No mass, hydronephrosis, or shadowing calcification. Bladder: Appears normal for degree of bladder distention. Other: N/A IMPRESSION: No renal sonographic abnormalities identified. Electronically Signed   By: Lavonia Dana M.D.   On: 02/28/2021 15:44    ECHOCARDIOGRAM COMPLETE  Result Date: 02/26/2021    ECHOCARDIOGRAM REPORT   Patient Name:   Leon Nichols Date of Exam: 02/26/2021 Medical Rec #:  [id number]      Height:       73.0 in Accession #:    [id number]     Weight:       230.0 lb Date of Birth:  [birth year]      BSA:          2.283 m Patient Age:    66 years       BP:           134/81 mmHg Patient Gender: M              HR:           91 bpm. Exam Location:  Inpatient Procedure: 2D Echo, Cardiac Doppler, Color Doppler and Intracardiac            Opacification Agent Indications:    Pulmonary Embolus I26.09  History:        Patient has no prior history of Echocardiogram examinations.                 Risk Factors:Hypertension.  Sonographer:    Darlina Sicilian RDCS Referring Phys: [place]  1. Left ventricular ejection fraction, by estimation, is 50 to 55%. The left ventricle has low normal function. The left ventricle has no regional wall motion abnormalities. There is mild left ventricular hypertrophy. Left ventricular diastolic parameters were normal.  2. Right ventricular systolic function is moderately reduced. The right ventricular size is mildly enlarged. There is mildly elevated pulmonary artery systolic pressure. The estimated right ventricular systolic pressure is [id number] mmHg.  3. The mitral valve is normal in structure. Trivial mitral valve regurgitation. No evidence of mitral stenosis.  4. The aortic valve is tricuspid. Aortic valve regurgitation is not visualized. No aortic stenosis is present.  5. Pulmonic valve regurgitation is severe.  6. The inferior vena cava is dilated in size with <50% respiratory variability, suggesting right atrial pressure of 15 mmHg. FINDINGS  Left Ventricle: Left ventricular ejection fraction, by estimation, is 50 to 55%. The left ventricle has low normal function. The left ventricle has no regional wall motion abnormalities. Definity contrast agent was given IV to delineate the left ventricular  endocardial borders. The left ventricular internal cavity size was normal in size. There is mild left ventricular hypertrophy. Left ventricular diastolic parameters were normal. Right Ventricle: The right ventricular size is mildly enlarged. Right vetricular wall thickness was not well visualized. Right ventricular systolic function is moderately reduced. There is mildly elevated pulmonary artery systolic pressure. The tricuspid  regurgitant velocity is 2.67 m/s, and with an assumed right atrial pressure  of 15 mmHg, the estimated right ventricular systolic pressure is 43.5 mmHg. Left Atrium: Left atrial size was normal in size. Right Atrium: Right atrial size was normal in size. Pericardium: Trivial pericardial effusion is present. Mitral Valve: The mitral valve is normal in structure. Trivial mitral valve regurgitation. No evidence of mitral valve stenosis. Tricuspid Valve: The tricuspid valve is normal in structure. Tricuspid valve regurgitation is mild . No evidence of tricuspid stenosis. Aortic Valve: The aortic valve is tricuspid. Aortic valve regurgitation is not visualized. No aortic stenosis is present. Pulmonic Valve: The pulmonic valve was normal in structure. Pulmonic valve regurgitation is severe. No evidence of pulmonic stenosis. Aorta: The aortic root is normal in size and structure. Venous: The inferior vena cava is dilated in size with less than 50% respiratory variability, suggesting right atrial pressure of 15 mmHg. IAS/Shunts: No atrial level shunt detected by color flow Doppler.  LEFT VENTRICLE PLAX 2D LVIDd:         4.80 cm      Diastology LVIDs:         3.00 cm      LV e' medial:    9.95 cm/s LV PW:         1.10 cm      LV E/e' medial:  10.1 LV IVS:        1.20 cm      LV e' lateral:   16.25 cm/s LVOT diam:     2.20 cm      LV E/e' lateral: 6.2 LV SV:         67 LV SV Index:   29 LVOT Area:     3.80 cm  LV Volumes (MOD) LV vol d, MOD A2C: 89.2 ml LV vol d, MOD A4C: 122.0 ml LV vol s, MOD A2C:  34.6 ml LV vol s, MOD A4C: 45.7 ml LV SV MOD A2C:     54.6 ml LV SV MOD A4C:     122.0 ml LV SV MOD BP:      62.7 ml RIGHT VENTRICLE RV S prime:     15.40 cm/s TAPSE (M-mode): 1.9 cm LEFT ATRIUM             Index        RIGHT ATRIUM           Index LA diam:        4.20 cm 1.84 cm/m   RA Area:     21.80 cm LA Vol (A2C):   58.5 ml 25.62 ml/m  RA Volume:   63.20 ml  27.68 ml/m LA Vol (A4C):   53.6 ml 23.47 ml/m LA Biplane Vol: 58.8 ml 25.75 ml/m  AORTIC VALVE             PULMONIC VALVE LVOT Vmax:   112.00 cm/s RVOT Peak grad: 3 mmHg LVOT Vmean:  67.900 cm/s LVOT VTI:    0.175 m  AORTA Ao Root diam: 3.00 cm Ao Asc diam:  3.40 cm MITRAL VALVE                TRICUSPID VALVE MV Area (PHT): 5.16 cm     TR Peak grad:   28.5 mmHg MV Decel Time: 147 msec     TR Vmax:        267.00 cm/s MV E velocity: 100.15 cm/s MV A velocity: 47.75 cm/s   SHUNTS MV E/A ratio:  2.10         Systemic VTI:  0.18 m  Systemic Diam: 2.20 cm                             Pulmonic VTI:  0.182 m Cherlynn Kaiser MD Electronically signed by Cherlynn Kaiser MD Signature Date/Time: 02/26/2021/5:54:05 PM    Final    CT HEMATURIA WORKUP  Result Date: 03/02/2021 CLINICAL DATA:  Hematuria, recent PE EXAM: CT ABDOMEN AND PELVIS WITHOUT AND WITH CONTRAST TECHNIQUE: Multidetector CT imaging of the abdomen and pelvis was performed following the standard protocol before and following the bolus administration of intravenous contrast. CONTRAST:  119mL OMNIPAQUE IOHEXOL 350 MG/ML SOLN COMPARISON:  CT chest angiogram, 02/26/2021 FINDINGS: Lower chest: New, small right pleural effusion. Dense, masslike infrahilar consolidation of the right lower lobe (series 4, image 10). Known pulmonary embolism is not well appreciated on this non tailored examination. Hepatobiliary: No solid liver abnormality is seen. Hepatomegaly, maximum coronal span 23.2 cm no gallstones, gallbladder wall thickening, or biliary dilatation. Pancreas:  Unremarkable. No pancreatic ductal dilatation or surrounding inflammatory changes. Spleen: Normal in size without significant abnormality. Adrenals/Urinary Tract: Adrenal glands are unremarkable. Kidneys are normal, without renal calculi, solid lesion, or hydronephrosis. Bladder is unremarkable. Stomach/Bowel: Stomach is within normal limits. Appendix appears normal. No evidence of bowel wall thickening, distention, or inflammatory changes. Vascular/Lymphatic: Infrarenal IVC filter. No enlarged abdominal or pelvic lymph nodes. Reproductive: No mass or other significant abnormality. Other: Anasarca. Left lower quadrant diverting loop sigmoid colostomy. Small volume ascites. Musculoskeletal: No acute or significant osseous findings. IMPRESSION: 1. No evidence of urinary tract calculus, suspicious renal lesion, or urinary tract filling defect to explain hematuria. No hydronephrosis. 2. Dense, masslike infrahilar consolidation of the right lower lobe, as seen on prior CT angiogram examination. Although this may reflect pulmonary infarction in the setting of embolism, lung mass is very difficult to exclude given this appearance. At minimum, recommend follow-up in 6-8 weeks to observe resolution. 3. New, small right pleural effusion. 4. Anasarca and small volume ascites. 5. Hepatomegaly. 6. Left lower quadrant diverting loop sigmoid colostomy. 7. Infrarenal IVC filter. Electronically Signed   By: Delanna Ahmadi M.D.   On: 03/02/2021 12:46   VAS [place] LOWER EXTREMITY VENOUS (DVT)  Result Date: 02/27/2021  Lower Venous DVT Study Patient Name:  Leon Nichols  Date of Exam:   02/26/2021 Medical Rec #: [id number]       Accession #:    [id number] Date of Birth: 1966/03/30       Patient Gender: M Patient Age:   69 years Exam Location:  [facility] Procedure:      VAS [place] LOWER EXTREMITY VENOUS (DVT) Referring Phys: [facility] ALVA --------------------------------------------------------------------------------  Indications:  Pulmonary embolism.  Comparison Study: No previous exams Performing Technologist: Jody Hill RVT, RDMS  Examination Guidelines: A complete evaluation includes B-mode imaging, spectral Doppler, color Doppler, and power Doppler as needed of all accessible portions of each vessel. Bilateral testing is considered an integral part of a complete examination. Limited examinations for reoccurring indications may be performed as noted. The reflux portion of the exam is performed with the patient in reverse Trendelenburg.  +---------+---------------+---------+-----------+----------+--------------+ RIGHT    CompressibilityPhasicitySpontaneityPropertiesThrombus Aging +---------+---------------+---------+-----------+----------+--------------+ CFV      Full           Yes      Yes                                 +---------+---------------+---------+-----------+----------+--------------+  SFJ      Full                                                        +---------+---------------+---------+-----------+----------+--------------+ FV Prox  Full           Yes      Yes                                 +---------+---------------+---------+-----------+----------+--------------+ FV Mid   Full           Yes      Yes                                 +---------+---------------+---------+-----------+----------+--------------+ FV DistalFull           Yes      Yes                                 +---------+---------------+---------+-----------+----------+--------------+ PFV      Full                                                        +---------+---------------+---------+-----------+----------+--------------+ POP      Full           Yes      Yes                                 +---------+---------------+---------+-----------+----------+--------------+ PTV      Full                                                         +---------+---------------+---------+-----------+----------+--------------+ PERO     Full                                                        +---------+---------------+---------+-----------+----------+--------------+   +---------+---------------+---------+-----------+----------+--------------+ LEFT     CompressibilityPhasicitySpontaneityPropertiesThrombus Aging +---------+---------------+---------+-----------+----------+--------------+ CFV      Full           Yes      Yes                                 +---------+---------------+---------+-----------+----------+--------------+ SFJ      Full                                                        +---------+---------------+---------+-----------+----------+--------------+  FV Prox  Full           Yes      Yes                                 +---------+---------------+---------+-----------+----------+--------------+ FV Mid   Full           Yes      Yes                                 +---------+---------------+---------+-----------+----------+--------------+ FV DistalFull           Yes      Yes                                 +---------+---------------+---------+-----------+----------+--------------+ PFV      Full                                                        +---------+---------------+---------+-----------+----------+--------------+ POP      Full           Yes      Yes                                 +---------+---------------+---------+-----------+----------+--------------+ PTV      Full                                                        +---------+---------------+---------+-----------+----------+--------------+ PERO     Full                                                        +---------+---------------+---------+-----------+----------+--------------+     Summary: BILATERAL: - No evidence of deep vein thrombosis seen in the lower extremities, bilaterally. - No evidence of  superficial venous thrombosis in the lower extremities, bilaterally. -No evidence of popliteal cyst, bilaterally.  LEFT: Subcutaneous edema seen in left calf.  *See table(s) above for measurements and observations. Electronically signed by Orlie Pollen on 02/27/2021 at 10:48:18 AM.    Final       Subjective: No chest pain. Sob has improved.   Discharge Exam: Vitals:   03/06/21 0520 03/06/21 1325  BP: 123/87 122/83  Pulse: 97 99  Resp: 18 16  Temp: 98.1 F (36.7 C) 98 F (36.7 C)  SpO2: 96% 100%   Vitals:   03/05/21 1950 03/06/21 0500 03/06/21 0520 03/06/21 1325  BP: 135/85  123/87 122/83  Pulse: (!) 103  97 99  Resp: 18  18 16   Temp: 98 F (36.7 C)  98.1 F (36.7 C) 98 F (36.7 C)  TempSrc: Oral     SpO2: 100%  96% 100%  Weight:  107.9 kg    Height:        General:  Pt is alert, awake, not in acute distress Cardiovascular: RRR, S1/S2 +, no rubs, no gallops Respiratory: CTA bilaterally, no wheezing, no rhonchi Abdominal: Soft, NT, ND, bowel sounds + Extremities: no edema, no cyanosis    The results of significant diagnostics from this hospitalization (including imaging, microbiology, ancillary and laboratory) are listed below for reference.     Microbiology: Recent Results (from the past 240 hour(s))  Resp Panel by RT-PCR (Flu A&B, Covid) Nasopharyngeal Swab     Status: None   Collection Time: 02/26/21 12:32 AM   Specimen: Nasopharyngeal Swab; Nasopharyngeal(NP) swabs in vial transport medium  Result Value Ref Range Status   SARS Coronavirus 2 by RT PCR NEGATIVE NEGATIVE Final    Comment: (NOTE) SARS-CoV-2 target nucleic acids are NOT DETECTED.  The SARS-CoV-2 RNA is generally detectable in upper respiratory specimens during the acute phase of infection. The lowest concentration of SARS-CoV-2 viral copies this assay can detect is 138 copies/mL. A negative result does not preclude SARS-Cov-2 infection and should not be used as the sole basis for treatment  or other patient management decisions. A negative result may occur with  improper specimen collection/handling, submission of specimen other than nasopharyngeal swab, presence of viral mutation(s) within the areas targeted by this assay, and inadequate number of viral copies(<138 copies/mL). A negative result must be combined with clinical observations, patient history, and epidemiological information. The expected result is Negative.  Fact Sheet for Patients:  EntrepreneurPulse.com.au  Fact Sheet for Healthcare Providers:  IncredibleEmployment.be  This test is no t yet approved or cleared by the [place] FDA and  has been authorized for detection and/or diagnosis of SARS-CoV-2 by FDA under an Emergency Use Authorization (EUA). This EUA will remain  in effect (meaning this test can be used) for the duration of the COVID-19 declaration under Section 564(b)(1) of the Act, 21 U.S.C.section 360bbb-3(b)(1), unless the authorization is terminated  or revoked sooner.       Influenza A by PCR NEGATIVE NEGATIVE Final   Influenza B by PCR NEGATIVE NEGATIVE Final    Comment: (NOTE) The Xpert Xpress SARS-CoV-2/FLU/RSV plus assay is intended as an aid in the diagnosis of influenza from Nasopharyngeal swab specimens and should not be used as a sole basis for treatment. Nasal washings and aspirates are unacceptable for Xpert Xpress SARS-CoV-2/FLU/RSV testing.  Fact Sheet for Patients: EntrepreneurPulse.com.au  Fact Sheet for Healthcare Providers: IncredibleEmployment.be  This test is not yet approved or cleared by the [place] FDA and has been authorized for detection and/or diagnosis of SARS-CoV-2 by FDA under an Emergency Use Authorization (EUA). This EUA will remain in effect (meaning this test can be used) for the duration of the COVID-19 declaration under Section 564(b)(1) of the Act, 21 U.S.C. section  360bbb-3(b)(1), unless the authorization is terminated or revoked.  Performed at [facility], [place] [address] [place], [place] [address]   Urine Culture     Status: None   Collection Time: 02/26/21  2:31 AM   Specimen: Urine, Clean Catch  Result Value Ref Range Status   Specimen Description   Final    URINE, CLEAN CATCH Performed at [facility], [place] [address] [place], [place] [address]    Special Requests   Final    Immunocompromised Performed at [facility], [place] [address] [place], [place] [address]    Culture   Final    NO GROWTH Performed at [facility], [place] [address] [place], [place] [address]  Report Status 02/27/2021 FINAL  Final  Culture, blood (routine x 2)     Status: None   Collection Time: 02/26/21  5:24 AM   Specimen: BLOOD  Result Value Ref Range Status   Specimen Description   Final    BLOOD LEFT ANTECUBITAL Performed at [place] [address] [place], [place] [address]    Special Requests   Final    BOTTLES DRAWN AEROBIC AND ANAEROBIC Blood Culture results may not be optimal due to an inadequate volume of blood received in culture bottles Performed at [place] [address] [place], [place] [address]    Culture   Final    NO GROWTH 5 DAYS Performed at [facility], [place] [address], [place], [place] [address]    Report Status 03/03/2021 FINAL  Final  MRSA Next Gen by PCR, Nasal     Status: None   Collection Time: 02/26/21  5:24 AM   Specimen: Nasal Mucosa; Nasal Swab  Result Value Ref Range Status   MRSA by PCR Next Gen NOT DETECTED NOT DETECTED Final    Comment: (NOTE) The GeneXpert MRSA Assay (FDA approved for NASAL specimens only), is one component of a comprehensive MRSA colonization surveillance program. It is not intended to diagnose MRSA infection nor to guide or monitor treatment for MRSA infections. Test performance  is not FDA approved in patients less than 84 years old. Performed at [facility], [place] [address], [place], [place] [address]   MRSA Next Gen by PCR, Nasal     Status: None   Collection Time: 02/26/21  9:11 AM   Specimen: Nasal Mucosa; Nasal Swab  Result Value Ref Range Status   MRSA by PCR Next Gen NOT DETECTED NOT DETECTED Final    Comment: (NOTE) The GeneXpert MRSA Assay (FDA approved for NASAL specimens only), is one component of a comprehensive MRSA colonization surveillance program. It is not intended to diagnose MRSA infection nor to guide or monitor treatment for MRSA infections. Test performance is not FDA approved in patients less than 100 years old. Performed at [facility], [place] [address] [place], [place] [address]   Culture, blood (routine x 2)     Status: None   Collection Time: 02/26/21 10:42 AM   Specimen: BLOOD LEFT FOREARM  Result Value Ref Range Status   Specimen Description   Final    BLOOD LEFT FOREARM Performed at [place] [address], [place], [place] [address]    Special Requests   Final    BOTTLES DRAWN AEROBIC ONLY Blood Culture adequate volume Performed at [place] [address] [place], [place] [address]    Culture   Final    NO GROWTH 5 DAYS Performed at [facility], [place] [address], [place],  [address]    Report Status 03/03/2021 FINAL  Final     Labs: BNP (last 3 results) Recent Labs    02/25/21 2350  BNP [id number]*   Basic Metabolic Panel: Recent Labs  Lab 03/01/21 0527 03/02/21 0550 03/03/21 0255 03/04/21 0313 03/05/21 0404  NA 133* 134* 133* 129* 134*  K 4.6 4.3 4.0 3.7 3.2*  CL 109 108 108 103 105  CO2 18* 18* 18* 18* 21*  GLUCOSE 106* 103* 108* 112* 124*  BUN 14 13 12 14 11   CREATININE 1.17 1.00 0.93 1.03 0.88  CALCIUM 8.6* 8.7* 8.8* 8.6* 8.6*  MG  --   --  1.8  --   --    Liver Function Tests: Recent Labs  Lab  02/28/21 0259  AST 11*  ALT 6  ALKPHOS 98  BILITOT 1.2  PROT 8.0  ALBUMIN 2.3*   No results for input(s): LIPASE, AMYLASE in the last 168 hours. No results for input(s): AMMONIA in the last 168 hours. CBC: Recent Labs  Lab 03/01/21 0527 03/02/21 0550 03/03/21 0255 03/04/21 0313 03/05/21 0404  WBC 13.7* 13.8* 12.2* 11.0* 10.2  HGB 7.7* 8.1* 7.7* 7.1* 7.5*  HCT 25.8* 26.0* 26.1* 24.4* 24.8*  MCV 93.5 92.2 94.6 93.1 90.5  PLT 359 367 355 353 363   Cardiac Enzymes: No results for input(s): CKTOTAL, CKMB, CKMBINDEX, TROPONINI in the last 168 hours. BNP: Invalid input(s): POCBNP CBG: No results for input(s): GLUCAP in the last 168 hours. D-Dimer No results for input(s): DDIMER in the last 72 hours. Hgb A1c No results for input(s): HGBA1C in the last 72 hours. Lipid Profile No results for input(s): CHOL, HDL, LDLCALC, TRIG, CHOLHDL, LDLDIRECT in the last 72 hours. Thyroid function studies No results for input(s): TSH, T4TOTAL, T3FREE, THYROIDAB in the last 72 hours.  Invalid input(s): FREET3 Anemia work up No results for input(s): VITAMINB12, FOLATE, FERRITIN, TIBC, IRON, RETICCTPCT in the last 72 hours. Urinalysis    Component Value Date/Time   COLORURINE YELLOW 02/26/2021 0200   APPEARANCEUR HAZY (A) 02/26/2021 0200   LABSPEC 1.018 02/26/2021 0200   PHURINE 5.0 02/26/2021 0200   GLUCOSEU NEGATIVE 02/26/2021 0200   HGBUR MODERATE (A) 02/26/2021 0200   HGBUR negative 03/29/2007 0859   BILIRUBINUR NEGATIVE 02/26/2021 0200   KETONESUR NEGATIVE 02/26/2021 0200   PROTEINUR 30 (A) 02/26/2021 0200   UROBILINOGEN 0.2 02/02/2014 0736   NITRITE NEGATIVE 02/26/2021 0200   LEUKOCYTESUR NEGATIVE 02/26/2021 0200   Sepsis Labs Invalid input(s): PROCALCITONIN,  WBC,  LACTICIDVEN Microbiology Recent Results (from the past 240 hour(s))  Resp Panel by RT-PCR (Flu A&B, Covid) Nasopharyngeal Swab     Status: None   Collection Time: 02/26/21 12:32 AM   Specimen: Nasopharyngeal  Swab; Nasopharyngeal(NP) swabs in vial transport medium  Result Value Ref Range Status   SARS Coronavirus 2 by RT PCR NEGATIVE NEGATIVE Final    Comment: (NOTE) SARS-CoV-2 target nucleic acids are NOT DETECTED.  The SARS-CoV-2 RNA is generally detectable in upper respiratory specimens during the acute phase of infection. The lowest concentration of SARS-CoV-2 viral copies this assay can detect is 138 copies/mL. A negative result does not preclude SARS-Cov-2 infection and should not be used as the sole basis for treatment or other patient management decisions. A negative result may occur with  improper specimen collection/handling, submission of specimen other than nasopharyngeal swab, presence of viral mutation(s) within the areas targeted by this assay, and inadequate number of viral copies(<138 copies/mL). A negative result must be combined with clinical observations, patient history, and epidemiological information. The expected result is Negative.  Fact Sheet for Patients:  EntrepreneurPulse.com.au  Fact Sheet for Healthcare Providers:  IncredibleEmployment.be  This test is no t yet approved or cleared by the [place] FDA and  has been authorized for detection and/or diagnosis of SARS-CoV-2 by FDA under an Emergency Use Authorization (EUA). This EUA will remain  in effect (meaning this test can be used) for the duration of the COVID-19 declaration under Section 564(b)(1) of the Act, 21 U.S.C.section 360bbb-3(b)(1), unless the authorization is terminated  or revoked sooner.       Influenza A by PCR NEGATIVE NEGATIVE Final  Influenza B by PCR NEGATIVE NEGATIVE Final    Comment: (NOTE) The Xpert Xpress SARS-CoV-2/FLU/RSV plus assay is intended as an aid in the diagnosis of influenza from Nasopharyngeal swab specimens and should not be used as a sole basis for treatment. Nasal washings and aspirates are unacceptable for Xpert Xpress  SARS-CoV-2/FLU/RSV testing.  Fact Sheet for Patients: BloggerCourse.com  Fact Sheet for Healthcare Providers: SeriousBroker.it  This test is not yet approved or cleared by the [place] FDA and has been authorized for detection and/or diagnosis of SARS-CoV-2 by FDA under an Emergency Use Authorization (EUA). This EUA will remain in effect (meaning this test can be used) for the duration of the COVID-19 declaration under Section 564(b)(1) of the Act, 21 U.S.C. section 360bbb-3(b)(1), unless the authorization is terminated or revoked.  Performed at [facility], [address], [place], [place] [address]   Urine Culture     Status: None   Collection Time: 02/26/21  2:31 AM   Specimen: Urine, Clean Catch  Result Value Ref Range Status   Specimen Description   Final    URINE, CLEAN CATCH Performed at [facility], [address] [place], [place] [address]    Special Requests   Final    Immunocompromised Performed at [facility], [address] [place], [place] [address]    Culture   Final    NO GROWTH Performed at [facility], [address], [place], [place] [address]    Report Status 02/27/2021 FINAL  Final  Culture, blood (routine x 2)     Status: None   Collection Time: 02/26/21  5:24 AM   Specimen: BLOOD  Result Value Ref Range Status   Specimen Description   Final    BLOOD LEFT ANTECUBITAL Performed at [facility], [address] [place], [place] [address]    Special Requests   Final    BOTTLES DRAWN AEROBIC AND ANAEROBIC Blood Culture results may not be optimal due to an inadequate volume of blood received in culture bottles Performed at [facility], [address], [place], [place] [address]    Culture   Final    NO GROWTH 5 DAYS Performed at [facility], [address], [place], [place]  [address]    Report Status 03/03/2021 FINAL  Final  MRSA Next Gen by PCR, Nasal     Status: None   Collection Time: 02/26/21  5:24 AM   Specimen: Nasal Mucosa; Nasal Swab  Result Value Ref Range Status   MRSA by PCR Next Gen NOT DETECTED NOT DETECTED Final    Comment: (NOTE) The GeneXpert MRSA Assay (FDA approved for NASAL specimens only), is one component of a comprehensive MRSA colonization surveillance program. It is not intended to diagnose MRSA infection nor to guide or monitor treatment for MRSA infections. Test performance is not FDA approved in patients less than 72 years old. Performed at [facility], [address] [place], [place] [address]   MRSA Next Gen by PCR, Nasal     Status: None   Collection Time: 02/26/21  9:11 AM   Specimen: Nasal Mucosa; Nasal Swab  Result Value Ref Range Status   MRSA by PCR Next Gen NOT DETECTED NOT DETECTED Final    Comment: (NOTE) The GeneXpert MRSA Assay (FDA approved for NASAL specimens only), is one component of a comprehensive MRSA colonization surveillance program. It is not intended to diagnose MRSA infection nor  to guide or monitor treatment for MRSA infections. Test performance is not FDA approved in patients less than 53 years old. Performed at [facility], [place] [address], [place], [place] [address]   Culture, blood (routine x 2)     Status: None   Collection Time: 02/26/21 10:42 AM   Specimen: BLOOD LEFT FOREARM  Result Value Ref Range Status   Specimen Description   Final    BLOOD LEFT FOREARM Performed at [place] [address] [place], [place] [address]    Special Requests   Final    BOTTLES DRAWN AEROBIC ONLY Blood Culture adequate volume Performed at [place] [address] [place], [place] [address]    Culture   Final    NO GROWTH 5 DAYS Performed at [facility], [place] [address], [place], [place] [address]    Report  Status 03/03/2021 FINAL  Final     Time coordinating discharge: 39 minutes.   SIGNED:   Hosie Poisson, MD  [facility] 03/06/2021, 7:23 PM

## 2021-03-06 NOTE — Care Management Important Message (Signed)
Important Message  Patient Details IM Letter given to the Patient. Name: Leon Nichols MRN: [id number] Date of Birth: Jan 09, 1966   Medicare Important Message Given:  Yes     [name] 03/06/2021, 11:50 AM

## 2021-03-06 NOTE — Progress Notes (Addendum)
   03/06/21 1353  Mobility  Activity Ambulated in hall  Level of Assistance Independent  Assistive Device None  Distance Ambulated (ft) 350 ft  Mobility Ambulated independently in hallway  Mobility Response Tolerated well  Mobility performed by Mobility specialist  $Mobility charge 1 Mobility   Pt agreeable to mobilizing this afternoon. Ambulated about 320ft in hall, tolerated well. No complaints. Left pt in chair with call bell at side. RN notified of session.  [place] [employer] Mobility Specialist Acute Rehab Services Office: [phone]

## 2021-03-06 NOTE — Progress Notes (Signed)
Reviewed written d/c instructions w pt and all questions answered. He verbalized understanding. Pt reports that he has lost his slip-on bedroom shoes (suede, size 12). D/C per w/c in stable condition.

## 2021-03-31 ENCOUNTER — Telehealth: Payer: Self-pay

## 2021-03-31 NOTE — Telephone Encounter (Signed)
Notes scanned to referral 

## 2021-04-04 NOTE — Progress Notes (Signed)
Cardiology Office Note:    Date:  04/07/2021   ID:  Leon Nichols, DOB 19-Aug-1965, MRN [id number]  PCP:  Oneita Hurt, No   [facility] Providers Cardiologist:  Alverda Skeans, MD Referring MD: Juliet Rude*   Chief Complaint/Reason for Referral:  Pulmonary embolism follow up  ASSESSMENT:    Other acute pulmonary embolism without acute cor pulmonale (HCC) - Plan: ECHOCARDIOGRAM COMPLETE  HIV disease (HCC) - Plan: ECHOCARDIOGRAM COMPLETE  Hypertension, unspecified type - Plan: ECHOCARDIOGRAM COMPLETE    PLAN:    In order of problems listed above:  1.  Continue Eliquis for 6 months.  RV dysfunction during PE is not abnormal.  We will obtain an echocardiogram in 2 months to assess for improvement in RV dysfunction.  It is unusual that the patient has pulmonary regurgitation, the echocardiogram will assess for that as well.  I will refer him for cardiac rehabilitation and see him back in 6 months.  2.  Continue current management for other providers.  3.  His blood pressure is mildly above goal today.  The next time he sees a provider if his blood pressure is above 130/80 another medication should be started perhaps an ACE inhibitor or ARB.      Cardiac Rehabilitation Eligibility Assessment             Dispo:  No follow-ups on file.     Medication Adjustments/Labs and Tests Ordered: Current medicines are reviewed at length with the patient today.  Concerns regarding medicines are outlined above.   Tests Ordered: Orders Placed This Encounter  Procedures   AMB referral to cardiac rehabilitation   ECHOCARDIOGRAM COMPLETE    Medication Changes: No orders of the defined types were placed in this encounter.   History of Present Illness:    FOCUSED CARDIOVASCULAR PROBLEM LIST:   1.  DVT and PE November 2022 with IVC filter in place 2.  Hypertension  The patient is a 55 y.o. male with the indicated medical history here for for recommendations regarding  recently diagnosed PE..  The patient was seen by his primary care provider recently.  He had been hospitalized in November 2022 with bilateral leg swelling, DVT, and PE.  Not surprisingly his echocardiogram demonstrated moderate right ventricular dysfunction and interestingly severe pulmonary valve regurgitation.  He also was found to be profoundly anemic and received transfusion of 4 units of packed red blood cells.  Upper endoscopy was negative and colonoscopy was incomplete.  He was started on Eliquis appropriately.  He was seen by his primary care provider and an incidental murmur was also detected.  His breathing was improved though he still had bilateral lower extremity edema (albumin low in November).  He says he is doing better but he is short of breath with moderate exertion.  He denies any chest pain, palpitations, paroxysmal nocturnal dyspnea, orthopnea.  He has some residual lower extremity swelling as well.  He has required no emergency room visits or hospitalizations.  He is otherwise fairly well.     Previous Medical History: Past Medical History:  Diagnosis Date   Acute back pain 10/28/2016   Colitis    Diverticulosis    Hemorrhoids    History of shingles    HIV (human immunodeficiency virus infection) (HCC) 03/24/2012   Hypertension    Perianal abscess 02/06/2015   Perirectal abscess    Septic arthritis (HCC) 02/05/2015     Current Medications: Current Meds  Medication Sig   abacavir-dolutegravir-lamiVUDine (TRIUMEQ) 600-50-300 MG tablet  Take 1 tablet by mouth daily.   amLODipine (NORVASC) 10 MG tablet TAKE 1 TABLET(10 MG) BY MOUTH DAILY   apixaban (ELIQUIS) 5 MG TABS tablet Take 1 tablet (5 mg total) by mouth 2 (two) times daily.   pantoprazole (PROTONIX) 40 MG tablet Take 1 tablet (40 mg total) by mouth daily.   [DISCONTINUED] docusate sodium (COLACE) 100 MG capsule Take 1 capsule (100 mg total) by mouth 2 (two) times daily as needed for mild constipation.      Allergies:    Patient has no known allergies.   Social History:   Social History   Tobacco Use   Smoking status: Former    Packs/day: 0.25    Years: 15.00    Pack years: 3.75    Types: Cigarettes   Smokeless tobacco: Never   Tobacco comments:    less than 5 cigarettes a day  Vaping Use   Vaping Use: Never used  Substance Use Topics   Alcohol use: Yes    Alcohol/week: 50.0 standard drinks    Types: 50 Cans of beer per week    Comment: 6-7 beers a day   Drug use: Not Currently    Types: Marijuana, Cocaine     Family Hx: Family History  Problem Relation Age of Onset   Hypertension Mother    Diabetes Mother      Review of Systems:   Please see the history of present illness.    All other systems reviewed and are negative.  EKGs/Labs/Other Test Reviewed:    EKG:  prior EKG: Sinus rhythm  Prior CV studies:  ECHO COMPLETE WITH IMAGING ENHANCING AGENT 02/26/2021  1. Left ventricular ejection fraction, by estimation, is 50 to 55%. The left ventricle has low normal function. The left ventricle has no regional wall motion abnormalities. There is mild left ventricular hypertrophy. Left ventricular diastolic parameters were normal. 2. Right ventricular systolic function is moderately reduced. The right ventricular size is mildly enlarged. There is mildly elevated pulmonary artery systolic pressure. The estimated right ventricular systolic pressure is [id number] mmHg. 3. The mitral valve is normal in structure. Trivial mitral valve regurgitation. No evidence of mitral stenosis. 4. The aortic valve is tricuspid. Aortic valve regurgitation is not visualized. No aortic stenosis is present. 5. Pulmonic valve regurgitation is severe. 6. The inferior vena cava is dilated in size with <50% respiratory variability, suggesting right atrial pressure of 15 mmHg. size was normal in size.   Imaging studies that I have independently reviewed today: Echocardiogram and CT scan  Recent  Labs: 02/25/2021: B Natriuretic Peptide 312.1 02/28/2021: ALT 6; TSH 1.401 03/03/2021: Magnesium 1.8 03/05/2021: BUN 11; Creatinine, Ser 0.88; Hemoglobin 7.5; Platelets 363; Potassium 3.2; Sodium 134   Recent Lipid Panel Lab Results  Component Value Date/Time   CHOL 139 05/08/2020 04:42 PM   TRIG 141 05/08/2020 04:42 PM   HDL 42 05/08/2020 04:42 PM   LDLCALC 75 05/08/2020 04:42 PM    Risk Assessment/Calculations:          Physical Exam:    VS:  BP 138/80    Pulse 97    Ht 6\' 1"  (1.854 m)    Wt 236 lb (107 kg)    SpO2 97%    BMI 31.14 kg/m    Wt Readings from Last 3 Encounters:  04/07/21 236 lb (107 kg)  03/06/21 237 lb 14.4 oz (107.9 kg)  01/28/21 230 lb (104.3 kg)    GENERAL:  No apparent distress, AOx3 HEENT:  No carotid  bruits, +2 carotid impulses, no scleral icterus CAR: RRR no murmurs, gallops, rubs, or thrills RES:  Clear to auscultation bilaterally ABD:  Soft, nontender, nondistended, positive bowel sounds x 4 VASC:  +2 radial pulses, +2 carotid pulses, palpable pedal pulses NEURO:  CN 2-12 grossly intact; motor and sensory grossly intact PSYCH:  No active depression or anxiety EXT:  +1 to +2 edema, no ecchymosis, or cyanosis  Signed, Early Osmond, MD  04/07/2021 9:13 AM    [place], [place], [place]  [address] Phone: [phone]; Fax: [phone]

## 2021-04-07 ENCOUNTER — Other Ambulatory Visit: Payer: Self-pay

## 2021-04-07 ENCOUNTER — Ambulatory Visit (INDEPENDENT_AMBULATORY_CARE_PROVIDER_SITE_OTHER): Payer: Medicare Other | Admitting: Internal Medicine

## 2021-04-07 ENCOUNTER — Encounter: Payer: Self-pay | Admitting: Internal Medicine

## 2021-04-07 VITALS — BP 138/80 | HR 97 | Ht 73.0 in | Wt 236.0 lb

## 2021-04-07 DIAGNOSIS — I10 Essential (primary) hypertension: Secondary | ICD-10-CM

## 2021-04-07 DIAGNOSIS — I2699 Other pulmonary embolism without acute cor pulmonale: Secondary | ICD-10-CM | POA: Diagnosis not present

## 2021-04-07 DIAGNOSIS — B20 Human immunodeficiency virus [HIV] disease: Secondary | ICD-10-CM | POA: Diagnosis not present

## 2021-04-07 NOTE — Patient Instructions (Addendum)
Medication Instructions:  Your physician recommends that you continue on your current medications as directed. Please refer to the Current Medication list given to you today.  *If you need a refill on your cardiac medications before your next appointment, please call your pharmacy*   Lab Work: None today If you have labs (blood work) drawn today and your tests are completely normal, you will receive your results only by: [place] (if you have MyChart) OR A paper copy in the mail If you have any lab test that is abnormal or we need to change your treatment, we will call you to review the results.   Testing/Procedures: Echocardiogram in 2 months.   Follow-Up: At [facility], you and your health needs are our priority.  As part of our continuing mission to provide you with exceptional heart care, we have created designated Provider Care Teams.  These Care Teams include your primary Cardiologist (physician) and Advanced Practice Providers (APPs -  Physician Assistants and Nurse Practitioners) who all work together to provide you with the care you need, when you need it.  We recommend signing up for the patient portal called "MyChart".  Sign up information is provided on this After Visit Summary.  MyChart is used to connect with patients for Virtual Visits (Telemedicine).  Patients are able to view lab/test results, encounter notes, upcoming appointments, etc.  Non-urgent messages can be sent to your provider as well.   To learn more about what you can do with MyChart, go to [email or web address].    Your next appointment:   6 month(s)  The format for your next appointment:   In Person  Provider:   Lenna Sciara MD.  You have been referred to Cardiopulmonary rehab.

## 2021-04-09 ENCOUNTER — Other Ambulatory Visit: Payer: Self-pay | Admitting: *Deleted

## 2021-04-09 DIAGNOSIS — I2699 Other pulmonary embolism without acute cor pulmonale: Secondary | ICD-10-CM

## 2021-04-14 NOTE — Progress Notes (Signed)
Order placed for pulmonary rehab

## 2021-04-21 ENCOUNTER — Encounter (HOSPITAL_COMMUNITY): Payer: Self-pay | Admitting: *Deleted

## 2021-04-21 NOTE — Progress Notes (Signed)
Received referral from Dr. Lynnette Caffey for this pt to participate in pulmonary rehab with the diagnosis of Other Acute Pulmonary embolism without cor pulmonale. Clinical review of pt follow up appt on 04/07/21 Pulmonary office note.  Pt with Covid Risk Score - 4. Pt appropriate for scheduling for Pulmonary rehab.  Will forward to support staff for scheduling and verification of insurance eligibility/benefits with pt consent. Alanson Aly, BSN Cardiac and Emergency planning/management officer

## 2021-04-27 ENCOUNTER — Ambulatory Visit: Payer: Medicare Other | Admitting: Internal Medicine

## 2021-06-05 ENCOUNTER — Ambulatory Visit (HOSPITAL_COMMUNITY): Payer: Medicare Other

## 2021-06-10 ENCOUNTER — Other Ambulatory Visit (HOSPITAL_COMMUNITY): Payer: Medicare Other

## 2021-06-12 ENCOUNTER — Encounter (HOSPITAL_COMMUNITY): Payer: Self-pay

## 2021-06-16 ENCOUNTER — Encounter (HOSPITAL_COMMUNITY): Payer: Self-pay

## 2021-06-16 ENCOUNTER — Ambulatory Visit (HOSPITAL_COMMUNITY): Payer: Medicare Other | Attending: Cardiovascular Disease

## 2021-06-16 NOTE — Progress Notes (Signed)
Verified appointment "no show" status with G. Andrews at 14:58.  ?

## 2021-06-17 ENCOUNTER — Encounter (HOSPITAL_COMMUNITY): Payer: Self-pay | Admitting: Internal Medicine

## 2021-06-30 ENCOUNTER — Telehealth (HOSPITAL_COMMUNITY): Payer: Self-pay

## 2021-06-30 ENCOUNTER — Ambulatory Visit: Payer: Medicare Other | Admitting: Internal Medicine

## 2021-06-30 NOTE — Telephone Encounter (Signed)
No response from pt regarding CR.  Closed referral.  

## 2021-08-20 ENCOUNTER — Ambulatory Visit (HOSPITAL_COMMUNITY)
Admission: EM | Admit: 2021-08-20 | Discharge: 2021-08-20 | Disposition: A | Discharge status: Home / Self Care | Payer: Medicare Other

## 2021-08-20 NOTE — ED Notes (Signed)
PT left he said he will be back later tonight after getting something to eat

## 2021-08-20 NOTE — ED Triage Notes (Signed)
Pt presents to [facility] voluntarily, unaccompanied at this time seeking treatment for drugs and alcohol. Pt currently resides in a rooming house and decided today to leave as he was having a hard time staying clean. Pt states " I gotta get outta there, it's drugs everywhere". Pt states he has been using for 30 years. Pt reports using cocaine and alcohol daily (pint, 12 pk of beer or whatever he can he get his hands on). Pt is casually dressed and cooperative during triage process. Pt denies SI, HI, AVH.

## 2021-08-21 ENCOUNTER — Ambulatory Visit (HOSPITAL_COMMUNITY)
Admission: EM | Admit: 2021-08-21 | Discharge: 2021-08-21 | Disposition: A | Discharge status: Home / Self Care | Payer: Medicare Other | Attending: Psychiatry | Admitting: Psychiatry

## 2021-08-21 DIAGNOSIS — B20 Human immunodeficiency virus [HIV] disease: Secondary | ICD-10-CM | POA: Diagnosis not present

## 2021-08-21 DIAGNOSIS — F1010 Alcohol abuse, uncomplicated: Secondary | ICD-10-CM | POA: Diagnosis not present

## 2021-08-21 DIAGNOSIS — F1910 Other psychoactive substance abuse, uncomplicated: Secondary | ICD-10-CM | POA: Insufficient documentation

## 2021-08-21 DIAGNOSIS — Z933 Colostomy status: Secondary | ICD-10-CM | POA: Insufficient documentation

## 2021-08-21 DIAGNOSIS — Z5900 Homelessness unspecified: Secondary | ICD-10-CM | POA: Diagnosis not present

## 2021-08-21 NOTE — Discharge Instructions (Addendum)
Patient to follow up with outpatient walk-in psychiatric clinic, resources given to the patient.

## 2021-08-21 NOTE — Progress Notes (Signed)
Patient had been seen about eight hours ago.  He had left before the NP could see him.  Now patient is back.  He says he did drink a 40oz beer and use some cocaine in the interim.  Pt says he had to go back to the boarding house to get his colostomy bags.  Pt admitted that he had to go back to get all of his things because he had actually been evicted from the boarding house due to non-payment of rent.  Pt admits that he is now homeless.  Pt is wanting to get into a residential treatment center.  Patient has no psychiatric medication monitoring.  He was last in treatment over 12 years ago when he went to [facility] for 3 day detox.  Pt reports having "shakes" in the mornings as a withdrawal symptom.  No hx of seizures.  Patient was informed by NP Sindy Guadeloupe that he was being recommended for outpatient SA treatment.  Pt complained that coming back was not helpful becasue he needs to get into a residential program.  Cliniicn encouraged him to come to outpatient services upstairs at [facility] as that may provide information and enroads into getting into residential detox or treatment.  Pt denies SI, HI or A/V hallucinations.

## 2021-08-21 NOTE — ED Provider Notes (Signed)
Behavioral Health Urgent Care Medical Screening Exam  Patient Name: Leon Nichols MRN: [id number] Date of Evaluation: 08/21/21 Chief Complaint:   Diagnosis:  Final diagnoses:  Homelessness  Substance abuse (HCC)  Alcohol abuse    History of Present illness: Leon Nichols is a 56 y.o. male.  With a history of substance and alcohol abuse.  Patient was seen last night at 2100 but patient leave and say he would come back sometime before the night is over.  Patient is now  back and states he needs somewhere to stay.  According to patient he was staying at a boarding house and got kicked out because he did not pay his bill.    Observation of patient, patient is alert and oriented x4 speech is clear, mood anxious and labile affect flat congruent with mood.  Patient denies suicidal ideation,  HI, AVH or paranoia.  According to patient he uses 1 gm cocaine yesterday.  Patient reports that he drank a 40 ounce beer and shots of liquor, and after he left last night he drank 40 ounces of liquor.  Patient is unemployed and is on disability and does have a history of HIV,  colon resection with outer colostomy.  Discussed with patient about resources for outpatient detox or alcohol program.  Will give patient resources to walk-in psychiatry.  Recommend discharge with resources to outpatient detox program.  Psychiatric Specialty Exam  Presentation  General Appearance:Casual  Eye Contact:Fair  Speech:Clear and Coherent  Speech Volume:Normal  Handedness:Ambidextrous   Mood and Affect  Mood:Anxious  Affect:Appropriate   Thought Process  Thought Processes:Coherent  Descriptions of Associations:Circumstantial  Orientation:Full (Time, Place and Person)  Thought Content:Tangential    Hallucinations:None  Ideas of Reference:None  Suicidal Thoughts:No  Homicidal Thoughts:No   Sensorium  Memory:Immediate Fair  Judgment:Poor  Insight:Poor   Executive Functions   Concentration:Poor  Attention Span:Fair  Recall:Fair  Fund of Knowledge:Fair  Language:Fair   Psychomotor Activity  Psychomotor Activity:Normal   Assets  Assets:Housing; Desire for Improvement   Sleep  Sleep:Fair  Number of hours: No data recorded  Nutritional Assessment (For OBS and FBC admissions only) Has the patient had a weight loss or gain of 10 pounds or more in the last 3 months?: No Has the patient had a decrease in food intake/or appetite?: No Does the patient have dental problems?: No Does the patient have eating habits or behaviors that may be indicators of an eating disorder including binging or inducing vomiting?: No Has the patient recently lost weight without trying?: 0 Has the patient been eating poorly because of a decreased appetite?: 0 Malnutrition Screening Tool Score: 0    Physical Exam: Physical Exam HENT:     Head: Normocephalic.     Nose: Nose normal.  Cardiovascular:     Rate and Rhythm: Normal rate.  Pulmonary:     Effort: Pulmonary effort is normal.  Musculoskeletal:        General: Normal range of motion.     Cervical back: Normal range of motion.  Skin:    General: Skin is warm.  Neurological:     General: No focal deficit present.     Mental Status: He is alert.  Psychiatric:        Mood and Affect: Mood normal.        Behavior: Behavior normal.        Thought Content: Thought content normal.        Judgment: Judgment normal.   Review of Systems  Constitutional: Negative.   HENT: Negative.    Eyes: Negative.   Respiratory: Negative.    Cardiovascular: Negative.   Gastrointestinal: Negative.   Genitourinary: Negative.   Musculoskeletal: Negative.   Skin: Negative.   Neurological: Negative.   Endo/Heme/Allergies: Negative.   Psychiatric/Behavioral:  Positive for substance abuse. The patient is nervous/anxious.   Blood pressure (!) 136/106, pulse 99, temperature 98.5 F (36.9 C), temperature source Oral, resp. rate  20, SpO2 96 %. There is no height or weight on file to calculate BMI.  Musculoskeletal: Strength & Muscle Tone: within normal limits Gait & Station: normal Patient leans: N/A   [facility] MSE Discharge Disposition for Follow up and Recommendations: Based on my evaluation the patient does not appear to have an emergency medical condition and can be discharged with resources and follow up care in outpatient services for Substance Abuse Intensive Outpatient Program and Individual Therapy   Sindy Guadeloupe, NP 08/21/2021, 4:56 AM

## 2021-09-24 ENCOUNTER — Other Ambulatory Visit: Payer: Self-pay | Admitting: Family Medicine

## 2021-10-12 ENCOUNTER — Other Ambulatory Visit: Payer: Self-pay | Admitting: Family Medicine

## 2021-10-12 DIAGNOSIS — Z933 Colostomy status: Secondary | ICD-10-CM

## 2021-11-09 ENCOUNTER — Other Ambulatory Visit: Payer: Medicare Other

## 2021-11-17 ENCOUNTER — Other Ambulatory Visit: Payer: Self-pay | Admitting: Internal Medicine

## 2021-11-17 ENCOUNTER — Other Ambulatory Visit: Payer: Self-pay

## 2021-11-17 DIAGNOSIS — B20 Human immunodeficiency virus [HIV] disease: Secondary | ICD-10-CM

## 2021-11-17 MED ORDER — TRIUMEQ 600-50-300 MG PO TABS: 1.0000 | ORAL_TABLET | Freq: Every day | ORAL | 5 refills | Status: DC

## 2021-11-17 NOTE — Progress Notes (Signed)
Patient prefers Rx to be sent to [facility]. Rx at [address] Denied.

## 2021-11-25 ENCOUNTER — Other Ambulatory Visit: Payer: Self-pay

## 2021-11-25 DIAGNOSIS — Z79899 Other long term (current) drug therapy: Secondary | ICD-10-CM

## 2021-11-25 DIAGNOSIS — Z113 Encounter for screening for infections with a predominantly sexual mode of transmission: Secondary | ICD-10-CM

## 2021-11-25 DIAGNOSIS — B20 Human immunodeficiency virus [HIV] disease: Secondary | ICD-10-CM

## 2021-11-26 ENCOUNTER — Other Ambulatory Visit: Payer: Self-pay

## 2021-11-26 ENCOUNTER — Ambulatory Visit
Admission: RE | Admit: 2021-11-26 | Discharge: 2021-11-26 | Disposition: A | Discharge status: Home / Self Care | Payer: Medicare Other | Source: Ambulatory Visit | Attending: Family Medicine | Admitting: Family Medicine

## 2021-11-26 ENCOUNTER — Other Ambulatory Visit: Payer: Medicare Other

## 2021-11-26 ENCOUNTER — Other Ambulatory Visit (HOSPITAL_COMMUNITY)
Admission: RE | Admit: 2021-11-26 | Discharge: 2021-11-26 | Disposition: A | Discharge status: Home / Self Care | Payer: Medicare Other | Source: Ambulatory Visit | Attending: Internal Medicine | Admitting: Internal Medicine

## 2021-11-26 DIAGNOSIS — Z933 Colostomy status: Secondary | ICD-10-CM

## 2021-11-26 DIAGNOSIS — B20 Human immunodeficiency virus [HIV] disease: Secondary | ICD-10-CM

## 2021-11-26 DIAGNOSIS — Z113 Encounter for screening for infections with a predominantly sexual mode of transmission: Secondary | ICD-10-CM | POA: Diagnosis present

## 2021-11-26 DIAGNOSIS — Z79899 Other long term (current) drug therapy: Secondary | ICD-10-CM

## 2021-11-26 MED ORDER — IOPAMIDOL (ISOVUE-300) INJECTION 61%
100.0000 mL | Freq: Once | INTRAVENOUS | Status: AC | PRN
  Administered 2021-11-26: 100 mL via INTRAVENOUS

## 2021-11-27 LAB — T-HELPER CELL (CD4) - (RCID CLINIC ONLY)
CD4 % Helper T Cell: 23 % — ABNORMAL LOW (ref 33–65)
CD4 T Cell Abs: 525 /uL (ref 400–1790)

## 2021-11-27 LAB — URINE CYTOLOGY ANCILLARY ONLY
Chlamydia: NEGATIVE
Comment: NEGATIVE
Comment: NORMAL
Neisseria Gonorrhea: NEGATIVE

## 2021-12-01 LAB — CBC WITH DIFFERENTIAL/PLATELET
Absolute Monocytes: 470 cells/uL (ref 200–950)
Basophils Absolute: 22 cells/uL (ref 0–200)
Basophils Relative: 0.4 %
Eosinophils Absolute: 59 cells/uL (ref 15–500)
Eosinophils Relative: 1.1 %
HCT: 48.6 % (ref 38.5–50.0)
Hemoglobin: 17.3 g/dL — ABNORMAL HIGH (ref 13.2–17.1)
Lymphs Abs: 2678 cells/uL (ref 850–3900)
MCH: 33.3 pg — ABNORMAL HIGH (ref 27.0–33.0)
MCHC: 35.6 g/dL (ref 32.0–36.0)
MCV: 93.5 fL (ref 80.0–100.0)
MPV: 9.8 fL (ref 7.5–12.5)
Monocytes Relative: 8.7 %
Neutro Abs: 2171 cells/uL (ref 1500–7800)
Neutrophils Relative %: 40.2 %
Platelets: 226 10*3/uL (ref 140–400)
RBC: 5.2 10*6/uL (ref 4.20–5.80)
RDW: 12.8 % (ref 11.0–15.0)
Total Lymphocyte: 49.6 %
WBC: 5.4 10*3/uL (ref 3.8–10.8)

## 2021-12-01 LAB — COMPLETE METABOLIC PANEL WITH GFR
AG Ratio: 1.1 (calc) (ref 1.0–2.5)
ALT: 7 U/L — ABNORMAL LOW (ref 9–46)
AST: 16 U/L (ref 10–35)
Albumin: 4.3 g/dL (ref 3.6–5.1)
Alkaline phosphatase (APISO): 81 U/L (ref 35–144)
BUN: 17 mg/dL (ref 7–25)
CO2: 23 mmol/L (ref 20–32)
Calcium: 9.9 mg/dL (ref 8.6–10.3)
Chloride: 101 mmol/L (ref 98–110)
Creat: 1.02 mg/dL (ref 0.70–1.30)
Globulin: 3.8 g/dL (calc) — ABNORMAL HIGH (ref 1.9–3.7)
Glucose, Bld: 101 mg/dL — ABNORMAL HIGH (ref 65–99)
Potassium: 4.2 mmol/L (ref 3.5–5.3)
Sodium: 137 mmol/L (ref 135–146)
Total Bilirubin: 0.6 mg/dL (ref 0.2–1.2)
Total Protein: 8.1 g/dL (ref 6.1–8.1)
eGFR: 87 mL/min/{1.73_m2} (ref >=60)

## 2021-12-01 LAB — LIPID PANEL
Cholesterol: 207 mg/dL — ABNORMAL HIGH (ref <200)
HDL: 69 mg/dL (ref >=40)
LDL Cholesterol (Calc): 115 mg/dL (calc) — ABNORMAL HIGH
Non-HDL Cholesterol (Calc): 138 mg/dL (calc) — ABNORMAL HIGH (ref <130)
Total CHOL/HDL Ratio: 3 (calc) (ref <5.0)
Triglycerides: 121 mg/dL (ref <150)

## 2021-12-01 LAB — HIV-1 RNA QUANT-NO REFLEX-BLD
HIV 1 RNA Quant: 598 Copies/mL — ABNORMAL HIGH
HIV-1 RNA Quant, Log: 2.78 Log cps/mL — ABNORMAL HIGH

## 2021-12-01 LAB — RPR: RPR Ser Ql: NONREACTIVE

## 2021-12-10 ENCOUNTER — Encounter: Payer: Medicare Other | Admitting: Internal Medicine

## 2021-12-15 ENCOUNTER — Other Ambulatory Visit: Payer: Self-pay

## 2021-12-15 ENCOUNTER — Ambulatory Visit (INDEPENDENT_AMBULATORY_CARE_PROVIDER_SITE_OTHER): Payer: Medicare Other | Admitting: Internal Medicine

## 2021-12-15 ENCOUNTER — Telehealth: Payer: Self-pay

## 2021-12-15 ENCOUNTER — Encounter: Payer: Self-pay | Admitting: Internal Medicine

## 2021-12-15 VITALS — BP 147/86 | HR 88 | Temp 98.0°F | Wt 219.0 lb

## 2021-12-15 DIAGNOSIS — Z79899 Other long term (current) drug therapy: Secondary | ICD-10-CM | POA: Diagnosis not present

## 2021-12-15 DIAGNOSIS — B20 Human immunodeficiency virus [HIV] disease: Secondary | ICD-10-CM

## 2021-12-15 DIAGNOSIS — I10 Essential (primary) hypertension: Secondary | ICD-10-CM | POA: Diagnosis not present

## 2021-12-15 DIAGNOSIS — Z86711 Personal history of pulmonary embolism: Secondary | ICD-10-CM | POA: Diagnosis not present

## 2021-12-15 DIAGNOSIS — F1020 Alcohol dependence, uncomplicated: Secondary | ICD-10-CM

## 2021-12-15 MED ORDER — AMLODIPINE BESYLATE 10 MG PO TABS
ORAL_TABLET | ORAL | 11 refills | Status: DC
Start: 2021-12-15 — End: 2023-10-14

## 2021-12-15 MED ORDER — LISINOPRIL 10 MG PO TABS: 10.0000 mg | ORAL_TABLET | Freq: Every day | ORAL | 11 refills | Status: DC

## 2021-12-15 MED ORDER — PANTOPRAZOLE SODIUM 40 MG PO TBEC: 40.0000 mg | DELAYED_RELEASE_TABLET | Freq: Every day | ORAL | 11 refills | Status: DC

## 2021-12-15 MED ORDER — TRIUMEQ 600-50-300 MG PO TABS: 1.0000 | ORAL_TABLET | Freq: Every day | ORAL | 11 refills | Status: DC

## 2021-12-15 NOTE — Progress Notes (Signed)
OEU:MPNTIR up for hiv disease  Patient ID: Leon Nichols, male   DOB: 02/22/1966, 56 y.o.   MRN: [id number]  HPI  Leon Nichols is a 56yo M with well controlled hiv disease, CD 4 count of 523 but VL at 583 in august. Missed a few doses (5 doses over a month) Also needs protonix --  He had acute pulm PE with cor pulmonale in nov. 2022 was presribe to have eliquis x 6 months with repeat complete echo but did not get echo in march. He would like to go back to see cardiology  Also seeing [place] for possibly reversal of ostomy  Sochx: 12 pack of ice beer per day $15/day Outpatient Encounter Medications as of 12/15/2021  Medication Sig   abacavir-dolutegravir-lamiVUDine (TRIUMEQ) 600-50-300 MG tablet Take 1 tablet by mouth daily.   amLODipine (NORVASC) 10 MG tablet TAKE 1 TABLET(10 MG) BY MOUTH DAILY   pantoprazole (PROTONIX) 40 MG tablet Take 1 tablet (40 mg total) by mouth daily.   apixaban (ELIQUIS) 5 MG TABS tablet Take 1 tablet (5 mg total) by mouth 2 (two) times daily. (Patient not taking: Reported on 12/15/2021)   chlorpheniramine-HYDROcodone (TUSSIONEX) 10-8 MG/5ML SUER Take 5 mLs by mouth every 12 (twelve) hours as needed for cough. (Patient not taking: Reported on 04/07/2021)   No facility-administered encounter medications on file as of 12/15/2021.     Patient Active Problem List   Diagnosis Date Noted   Gross hematuria    Symptomatic anemia    Hypertension    Community acquired pneumonia    Acute pulmonary embolism (HCC) 02/26/2021   Acute DVT (deep venous thrombosis) (HCC) 02/26/2021   Occult blood in stools 02/26/2021   Pyuria, sterile 02/26/2021   Acute pulmonary embolism without acute cor pulmonale (HCC) 02/26/2021   Alcohol dependency (HCC) 02/20/2019   Chronic otitis externa of left ear 06/30/2018   Headache 10/15/2015   Altered bowel elimination due to intestinal ostomy (HCC) 02/04/2014   Acute kidney injury (HCC) 02/02/2014   Neuralgia, post-herpetic 01/02/2014    HIV disease (HCC) 03/24/2012   Anemia 03/23/2012   Essential hypertension 03/29/2007     Health Maintenance Due  Topic Date Due   COVID-19 Vaccine (1) Never done   TETANUS/TDAP  Never done   Zoster Vaccines- Shingrix (1 of 2) Never done   INFLUENZA VACCINE  11/03/2021     Review of Systems 12 point ros is negative except what is mentioned in hpi Physical Exam   Wt 219 lb (99.3 kg)   BMI 28.89 kg/m   BP (!) 147/86   Pulse 88   Temp 98 F (36.7 C) (Oral)   Wt 219 lb (99.3 kg)   SpO2 100%   BMI 28.89 kg/m . Physical Exam  Constitutional: He is oriented to person, place, and time. He appears well-developed and well-nourished. No distress.  HENT:  Mouth/Throat: Oropharynx is clear and moist. No oropharyngeal exudate.  Cardiovascular: Normal rate, regular rhythm and normal heart sounds. Exam reveals no gallop and no friction rub.  No murmur heard.  Pulmonary/Chest: Effort normal and breath sounds normal. No respiratory distress. He has no wheezes.  Abdominal: Soft. Bowel sounds are normal. He exhibits no distension. There is no tenderness.  Lymphadenopathy:  He has no cervical adenopathy.  Neurological: He is alert and oriented to person, place, and time.  Skin: Skin is warm and dry. No rash noted. No erythema.  Psychiatric: He has a normal mood and affect. His behavior is normal.  Lab Results  Component Value Date   CD4TCELL 23 (L) 11/26/2021   Lab Results  Component Value Date   CD4TABS 525 11/26/2021   CD4TABS 721 05/08/2020   CD4TABS 659 12/19/2019   Lab Results  Component Value Date   HIV1RNAQUANT 598 (H) 11/26/2021   No results found for: "HEPBSAB" Lab Results  Component Value Date   LABRPR NON-REACTIVE 11/26/2021    CBC Lab Results  Component Value Date   WBC 5.4 11/26/2021   RBC 5.20 11/26/2021   HGB 17.3 (H) 11/26/2021   HCT 48.6 11/26/2021   PLT 226 11/26/2021   MCV 93.5 11/26/2021   MCH 33.3 (H) 11/26/2021   MCHC 35.6 11/26/2021    RDW 12.8 11/26/2021   LYMPHSABS 2,678 11/26/2021   MONOABS 0.8 02/27/2021   EOSABS 59 11/26/2021    BMET Lab Results  Component Value Date   NA 137 11/26/2021   K 4.2 11/26/2021   CL 101 11/26/2021   CO2 23 11/26/2021   GLUCOSE 101 (H) 11/26/2021   BUN 17 11/26/2021   CREATININE 1.02 11/26/2021   CALCIUM 9.9 11/26/2021   GFRNONAA >60 03/05/2021   GFRAA 120 05/08/2020      Assessment and Plan  HTN =  Will start on lisinopril 10mg  in addn to amlodipine since BP 140s  HIV disease =Repeat HIV VL with genotype to see if need to change regimen. Continue on triumeq in the meantime  GERd =Will refill protonix  Pulmonary emboli = will not refill eliquis since I think he has completed course. Will get complete echo ordered again and refer back to cardiology for follow up  Alcohol dependence =Try to decrease intake, and will talk about naloxone use

## 2021-12-15 NOTE — Telephone Encounter (Signed)
Per Dr. Drue Second scheduled patient for complete ultrasound. Is scheduled for 9/19 at 2. Patient was made aware of appointment before he left office today. Does not have any questions at this time.  Juanita Laster, RMA

## 2021-12-18 LAB — HIV-1 RNA ULTRAQUANT REFLEX TO GENTYP+
HIV 1 RNA Quant: <20 copies/mL — AB
HIV-1 RNA Quant, Log: <1.3 Log copies/mL — AB

## 2021-12-22 ENCOUNTER — Ambulatory Visit (HOSPITAL_COMMUNITY): Payer: Medicare Other

## 2021-12-28 ENCOUNTER — Ambulatory Visit (HOSPITAL_COMMUNITY): Payer: Medicare Other | Attending: Internal Medicine

## 2021-12-30 ENCOUNTER — Ambulatory Visit (HOSPITAL_COMMUNITY): Admission: RE | Admit: 2021-12-30 | Payer: Medicare Other | Source: Ambulatory Visit

## 2021-12-30 NOTE — Progress Notes (Unsigned)
Cardiology Office Note:    Date:  12/31/2021   ID:  Leon Nichols, DOB 1965-07-05, MRN [id number]  PCP:  Kathyrn Lass   [facility] Providers Cardiologist:  Lenna Sciara, MD Referring MD: [name], MD   Chief Complaint/Reason for Referral: Follow-up for PE  PATIENT DID NOT APPEAR FOR APPOINTMENT   ASSESSMENT:    1. Other acute pulmonary embolism without acute cor pulmonale ([place])   2. Hypertension, unspecified type   3. HIV disease ([place])     PLAN:    In order of problems listed above: 1.  History of PE and DVT: We will obtain echocardiogram to evaluate RV recovery.  Stop Eliquis as he has completed more than 6 months. 2.  Hypertension: 3.  HIV: Followed by ID.          History of Present Illness:    FOCUSED PROBLEM LIST:   1.  DVT and PE November 2022 with IVC filter in place 2.  Hypertension 3.  HIV disease  January 2023 consultation: The patient is a 56 y.o. male with the indicated medical history here for for recommendations regarding recently diagnosed PE..  The patient was seen by his primary care provider recently.  He had been hospitalized in November 2022 with bilateral leg swelling, DVT, and PE.  Not surprisingly his echocardiogram demonstrated moderate right ventricular dysfunction and interestingly severe pulmonary valve regurgitation.  He also was found to be profoundly anemic and received transfusion of 4 units of packed red blood cells.  Upper endoscopy was negative and colonoscopy was incomplete.  He was started on Eliquis appropriately.  He was seen by his primary care provider and an incidental murmur was also detected.  His breathing was improved though he still had bilateral lower extremity edema (albumin low in November).   He says he is doing better but he is short of breath with moderate exertion.  He denies any chest pain, palpitations, paroxysmal nocturnal dyspnea, orthopnea.  He has some residual lower extremity swelling as well.  He has  required no emergency room visits or hospitalizations.  He is otherwise fairly well.  Plan: Continue Eliquis for 6 months and obtain echocardiogram in 2 months to assess RV function; refer to cardiac rehabilitation.    Today: The patient was recently seen by infectious diseases and it was noted that he was no longer on Eliquis.          Current Medications: No outpatient medications have been marked as taking for the 12/31/21 encounter (Office Visit) with Early Osmond, MD.     Allergies:    Patient has no known allergies.   Social History:   Social History   Tobacco Use   Smoking status: Some Days    Packs/day: 0.25    Years: 15.00    Total pack years: 3.75    Types: Cigarettes   Smokeless tobacco: Never   Tobacco comments:    Occ  Vaping Use   Vaping Use: Never used  Substance Use Topics   Alcohol use: Yes    Alcohol/week: 50.0 standard drinks of alcohol    Types: 50 Cans of beer per week    Comment: 6-7 beers a day   Drug use: Not Currently    Types: Marijuana, Cocaine     Family Hx: Family History  Problem Relation Age of Onset   Hypertension Mother    Diabetes Mother      Review of Systems:   Please see the history of present illness.  All other systems reviewed and are negative.     EKGs/Labs/Other Test Reviewed:    EKG:  EKG performed 2022 that I personally reviewed demonstrates sinus rhythm.  Prior CV studies:  TTE 2022:  1. Left ventricular ejection fraction, by estimation, is 50 to 55%. The left ventricle has low normal function. The left ventricle has no regional wall motion abnormalities. There is mild left ventricular hypertrophy. Left ventricular diastolic parameters were normal. 2. Right ventricular systolic function is moderately reduced. The right ventricular size is mildly enlarged. There is mildly elevated pulmonary artery systolic pressure. The estimated right ventricular systolic pressure is [id number] mmHg. 3. The mitral valve is  normal in structure. Trivial mitral valve regurgitation. No evidence of mitral stenosis. 4. The aortic valve is tricuspid. Aortic valve regurgitation is not visualized. No aortic stenosis is present. 5. Pulmonic valve regurgitation is severe. 6. The inferior vena cava is dilated in size with <50% respiratory variability, suggesting right atrial pressure of 15 mmHg. size was normal in size.  Other studies Reviewed: Review of the additional studies/records demonstrates: Chest CT without aortic atherosclerosis or coronary artery calcification  Recent Labs: 02/25/2021: B Natriuretic Peptide 312.1 02/28/2021: TSH 1.401 03/03/2021: Magnesium 1.8 11/26/2021: ALT 7; BUN 17; Creat 1.02; Hemoglobin 17.3; Platelets 226; Potassium 4.2; Sodium 137   Recent Lipid Panel Lab Results  Component Value Date/Time   CHOL 207 (H) 11/26/2021 11:34 AM   TRIG 121 11/26/2021 11:34 AM   HDL 69 11/26/2021 11:34 AM   LDLCALC 115 (H) 11/26/2021 11:34 AM    Risk Assessment/Calculations:      Signed, Early Osmond, MD  12/31/2021 3:42 PM    [facility] [address], [place], [place]  [address] Phone: [phone]; Fax: [phone]   Note:  This document was prepared using Dragon voice recognition software and may include unintentional dictation errors.

## 2021-12-31 ENCOUNTER — Ambulatory Visit (INDEPENDENT_AMBULATORY_CARE_PROVIDER_SITE_OTHER): Payer: Medicare Other | Admitting: Internal Medicine

## 2021-12-31 DIAGNOSIS — I2699 Other pulmonary embolism without acute cor pulmonale: Secondary | ICD-10-CM

## 2021-12-31 DIAGNOSIS — B20 Human immunodeficiency virus [HIV] disease: Secondary | ICD-10-CM

## 2021-12-31 DIAGNOSIS — I10 Essential (primary) hypertension: Secondary | ICD-10-CM

## 2022-01-04 ENCOUNTER — Telehealth (HOSPITAL_COMMUNITY): Payer: Self-pay | Admitting: Internal Medicine

## 2022-01-04 NOTE — Telephone Encounter (Signed)
Just an FYI. We have made several attempts to contact this patient including sending a letter to schedule or reschedule their echocardiogram. We will be removing the patient from the echo WQ.  12/30/21 NO SHOWED x 3  12/28/21 NO SHOWED x 2  06/16/2021 PT NO SHOWED X 1     Thank you

## 2022-01-12 ENCOUNTER — Other Ambulatory Visit: Payer: Self-pay | Admitting: Internal Medicine

## 2022-01-12 DIAGNOSIS — B20 Human immunodeficiency virus [HIV] disease: Secondary | ICD-10-CM

## 2022-01-12 MED ORDER — TRIUMEQ 600-50-300 MG PO TABS: 1.0000 | ORAL_TABLET | Freq: Every day | ORAL | 11 refills | Status: DC

## 2022-01-26 ENCOUNTER — Ambulatory Visit: Payer: Medicare Other | Admitting: Internal Medicine

## 2022-02-02 NOTE — Progress Notes (Unsigned)
Cardiology Office Note:    Date:  02/02/2022   ID:  Leon Nichols, DOB [birth year], MRN [id number]  PCP:  Kathyrn Lass   [facility] Providers Cardiologist:  Lenna Sciara, MD Referring MD: [name], MD   Chief Complaint/Reason for Referral:  Follow up for PE  PATIENT DID NOT APPEAR FOR APPOINTMENT   ASSESSMENT:    1. Other acute pulmonary embolism without acute cor pulmonale ([place])   2. Hypertension, unspecified type   3. HIV disease (Leon Nichols)     PLAN:    In order of problems listed above: 1.  History of PE and DVT: We will obtain echocardiogram to evaluate RV recovery.  Stop Eliquis as he has completed more than 6 months. 2.  Hypertension: 3.  HIV: Followed by ID.          Dispo:  No follow-ups on file.       History of Present Illness:    FOCUSED PROBLEM LIST:    1.  DVT and PE November 2022 with IVC filter in place 2.  Hypertension 3.  HIV disease   January 2023 consultation: The patient is a 56 y.o. male with the indicated medical history here for for recommendations regarding recently diagnosed PE..  The patient was seen by his primary care provider recently.  He had been hospitalized in November 2022 with bilateral leg swelling, DVT, and PE.  Not surprisingly his echocardiogram demonstrated moderate right ventricular dysfunction and interestingly severe pulmonary valve regurgitation.  He also was found to be profoundly anemic and received transfusion of 4 units of packed red blood cells.  Upper endoscopy was negative and colonoscopy was incomplete.  He was started on Eliquis appropriately.  He was seen by his primary care provider and an incidental murmur was also detected.  His breathing was improved though he still had bilateral lower extremity edema (albumin low in November).   He says he is doing better but he is short of breath with moderate exertion.  He denies any chest pain, palpitations, paroxysmal nocturnal dyspnea, orthopnea.  He has some residual  lower extremity swelling as well.  He has required no emergency room visits or hospitalizations.  He is otherwise fairly well.  Plan: Continue Eliquis for 6 months and obtain echocardiogram in 2 months to assess RV function; refer to cardiac rehabilitation.            Current Medications: No outpatient medications have been marked as taking for the 02/04/22 encounter (Appointment) with Early Osmond, MD.     Allergies:    Patient has no known allergies.   Social History:   Social History   Tobacco Use   Smoking status: Some Days    Packs/day: 0.25    Years: 15.00    Total pack years: 3.75    Types: Cigarettes   Smokeless tobacco: Never   Tobacco comments:    Occ  Vaping Use   Vaping Use: Never used  Substance Use Topics   Alcohol use: Yes    Alcohol/week: 50.0 standard drinks of alcohol    Types: 50 Cans of beer per week    Comment: 6-7 beers a day   Drug use: Not Currently    Types: Marijuana, Cocaine     Family Hx: Family History  Problem Relation Age of Onset   Hypertension Mother    Diabetes Mother      Review of Systems:   Please see the history of present illness.    All other systems  reviewed and are negative.     EKGs/Labs/Other Test Reviewed:    EKG:  EKG performed 2022 that I personally reviewed demonstrates sinus rhythm.   Prior CV studies:   TTE 2022:  1. Left ventricular ejection fraction, by estimation, is 50 to 55%. The left ventricle has low normal function. The left ventricle has no regional wall motion abnormalities. There is mild left ventricular hypertrophy. Left ventricular diastolic parameters were normal. 2. Right ventricular systolic function is moderately reduced. The right ventricular size is mildly enlarged. There is mildly elevated pulmonary artery systolic pressure. The estimated right ventricular systolic pressure is 43.5 mmHg. 3. The mitral valve is normal in structure. Trivial mitral valve regurgitation. No evidence of  mitral stenosis. 4. The aortic valve is tricuspid. Aortic valve regurgitation is not visualized. No aortic stenosis is present. 5. Pulmonic valve regurgitation is severe. 6. The inferior vena cava is dilated in size with <50% respiratory variability, suggesting right atrial pressure of 15 mmHg. size was normal in size.   Other studies Reviewed: Review of the additional studies/records demonstrates: Chest CT without aortic atherosclerosis or coronary artery calcification  Recent Labs: 02/25/2021: B Natriuretic Peptide 312.1 02/28/2021: TSH 1.401 03/03/2021: Magnesium 1.8 11/26/2021: ALT 7; BUN 17; Creat 1.02; Hemoglobin 17.3; Platelets 226; Potassium 4.2; Sodium 137   Recent Lipid Panel Lab Results  Component Value Date/Time   CHOL 207 (H) 11/26/2021 11:34 AM   TRIG 121 11/26/2021 11:34 AM   HDL 69 11/26/2021 11:34 AM   LDLCALC 115 (H) 11/26/2021 11:34 AM    Risk Assessment/Calculations:    Physical Exam:     Signed, Orbie Pyo, MD  02/02/2022 4:39 PM    [facility] [address] [place], [place], [place]  [address] Phone: [phone]; Fax: [phone]   Note:  This document was prepared using Dragon voice recognition software and may include unintentional dictation errors.

## 2022-02-04 ENCOUNTER — Ambulatory Visit (INDEPENDENT_AMBULATORY_CARE_PROVIDER_SITE_OTHER): Payer: Medicare Other | Admitting: Internal Medicine

## 2022-02-04 DIAGNOSIS — B20 Human immunodeficiency virus [HIV] disease: Secondary | ICD-10-CM

## 2022-02-04 DIAGNOSIS — I10 Essential (primary) hypertension: Secondary | ICD-10-CM

## 2022-02-04 DIAGNOSIS — I2699 Other pulmonary embolism without acute cor pulmonale: Secondary | ICD-10-CM

## 2022-02-05 ENCOUNTER — Ambulatory Visit: Payer: Medicare Other | Admitting: Internal Medicine

## 2022-02-08 ENCOUNTER — Ambulatory Visit: Payer: Medicare Other | Admitting: Family

## 2022-02-09 ENCOUNTER — Ambulatory Visit: Payer: Medicare Other | Admitting: Family

## 2022-03-18 ENCOUNTER — Encounter: Payer: Self-pay | Admitting: Internal Medicine

## 2022-03-18 ENCOUNTER — Ambulatory Visit (INDEPENDENT_AMBULATORY_CARE_PROVIDER_SITE_OTHER): Payer: Medicare Other | Admitting: Internal Medicine

## 2022-03-18 ENCOUNTER — Other Ambulatory Visit: Payer: Self-pay

## 2022-03-18 ENCOUNTER — Ambulatory Visit (INDEPENDENT_AMBULATORY_CARE_PROVIDER_SITE_OTHER): Payer: Medicare Other

## 2022-03-18 VITALS — BP 146/95 | HR 85 | Temp 97.9°F | Ht 73.0 in | Wt 246.0 lb

## 2022-03-18 DIAGNOSIS — Z23 Encounter for immunization: Secondary | ICD-10-CM

## 2022-03-18 DIAGNOSIS — Z79899 Other long term (current) drug therapy: Secondary | ICD-10-CM

## 2022-03-18 DIAGNOSIS — B20 Human immunodeficiency virus [HIV] disease: Secondary | ICD-10-CM | POA: Diagnosis not present

## 2022-03-18 MED ORDER — LISINOPRIL 20 MG PO TABS: 20.0000 mg | ORAL_TABLET | Freq: Every day | ORAL | 11 refills | Status: DC

## 2022-03-18 MED ORDER — ROSUVASTATIN CALCIUM 10 MG PO TABS: 10.0000 mg | ORAL_TABLET | Freq: Every day | ORAL | 11 refills | Status: DC

## 2022-03-18 NOTE — Progress Notes (Signed)
RFV: follow up for hiv disease  Patient ID: Leon Nichols, male   DOB: Nov 11, 1965, 56 y.o.   MRN: [id number]  HPI Leon Nichols is a 56yo M with hx of hiv disease, cd 4 count of 659/VL<20 in sept 2023 on triumeq doing well with adherence.  Stopping smoking and drinking. Now goes to [employer] - now for 30 days free. He is motivated to have ostomy reversal for which he has had 13 years now.  Missed his cardiology doctor due to transportation. Outpatient Encounter Medications as of 03/18/2022  Medication Sig   abacavir-dolutegravir-lamiVUDine (TRIUMEQ) 600-50-300 MG tablet Take 1 tablet by mouth daily.   amLODipine (NORVASC) 10 MG tablet TAKE 1 TABLET(10 MG) BY MOUTH DAILY   lisinopril (PRINIVIL) 10 MG tablet Take 1 tablet (10 mg total) by mouth daily.   pantoprazole (PROTONIX) 40 MG tablet Take 1 tablet (40 mg total) by mouth daily.   No facility-administered encounter medications on file as of 03/18/2022.     Patient Active Problem List   Diagnosis Date Noted   Gross hematuria    Symptomatic anemia    Hypertension    Community acquired pneumonia    Acute pulmonary embolism (HCC) 02/26/2021   Acute DVT (deep venous thrombosis) (HCC) 02/26/2021   Occult blood in stools 02/26/2021   Pyuria, sterile 02/26/2021   Acute pulmonary embolism without acute cor pulmonale (HCC) 02/26/2021   Alcohol dependency (HCC) 02/20/2019   Chronic otitis externa of left ear 06/30/2018   Headache 10/15/2015   Altered bowel elimination due to intestinal ostomy (HCC) 02/04/2014   Acute kidney injury (HCC) 02/02/2014   Neuralgia, post-herpetic 01/02/2014   HIV disease (HCC) 03/24/2012   Anemia 03/23/2012   Essential hypertension 03/29/2007     Health Maintenance Due  Topic Date Due   Medicare Annual Wellness (AWV)  Never done   COVID-19 Vaccine (1) Never done   DTaP/Tdap/Td (1 - Tdap) Never done   Zoster Vaccines- Shingrix (1 of 2) Never done   INFLUENZA VACCINE  11/03/2021     Review of  Systems Review of Systems  Constitutional: Negative for fever, chills, diaphoresis, activity change, appetite change, fatigue and unexpected weight change.  HENT: Negative for congestion, sore throat, rhinorrhea, sneezing, trouble swallowing and sinus pressure.  Eyes: Negative for photophobia and visual disturbance.  Respiratory: Negative for cough, chest tightness, shortness of breath, wheezing and stridor.  Cardiovascular: Negative for chest pain, palpitations and leg swelling.  Gastrointestinal: Negative for nausea, vomiting, abdominal pain, diarrhea, constipation, blood in stool, abdominal distention and anal bleeding.  Genitourinary: Negative for dysuria, hematuria, flank pain and difficulty urinating.  Musculoskeletal: Negative for myalgias, back pain, joint swelling, arthralgias and gait problem.  Skin: Negative for color change, pallor, rash and wound.  Neurological: Negative for dizziness, tremors, weakness and light-headedness.  Hematological: Negative for adenopathy. Does not bruise/bleed easily.  Psychiatric/Behavioral: Negative for behavioral problems, confusion, sleep disturbance, dysphoric mood, decreased concentration and agitation.   Physical Exam   BP (!) 160/97   Pulse 85   Temp 97.9 F (36.6 C) (Temporal)   Ht 6\' 1"  (1.854 m)   Wt 246 lb (111.6 kg)   SpO2 99%   BMI 32.46 kg/m   Physical Exam  Constitutional: He is oriented to person, place, and time. He appears well-developed and well-nourished. No distress.  HENT:  Mouth/Throat: Oropharynx is clear and moist. No oropharyngeal exudate.  Cardiovascular: Normal rate, regular rhythm and normal heart sounds. Exam reveals no gallop and no friction rub.  No murmur heard.  Pulmonary/Chest: Effort normal and breath sounds normal. No respiratory distress. He has no wheezes.  Abdominal: Soft. Bowel sounds are normal. He exhibits no distension. There is no tenderness. +ostomy Lymphadenopathy:  He has no cervical  adenopathy.  Neurological: He is alert and oriented to person, place, and time.  Skin: Skin is warm and dry. No rash noted. No erythema.  Psychiatric: He has a normal mood and affect. His behavior is normal.   Lab Results  Component Value Date   CD4TCELL 23 (L) 11/26/2021   Lab Results  Component Value Date   CD4TABS 525 11/26/2021   CD4TABS 721 05/08/2020   CD4TABS 659 12/19/2019   Lab Results  Component Value Date   HIV1RNAQUANT <20 DETECTED (A) 12/15/2021   No results found for: "HEPBSAB" Lab Results  Component Value Date   LABRPR NON-REACTIVE 11/26/2021    CBC Lab Results  Component Value Date   WBC 5.4 11/26/2021   RBC 5.20 11/26/2021   HGB 17.3 (H) 11/26/2021   HCT 48.6 11/26/2021   PLT 226 11/26/2021   MCV 93.5 11/26/2021   MCH 33.3 (H) 11/26/2021   MCHC 35.6 11/26/2021   RDW 12.8 11/26/2021   LYMPHSABS 2,678 11/26/2021   MONOABS 0.8 02/27/2021   EOSABS 59 11/26/2021    BMET Lab Results  Component Value Date   NA 137 11/26/2021   K 4.2 11/26/2021   CL 101 11/26/2021   CO2 23 11/26/2021   GLUCOSE 101 (H) 11/26/2021   BUN 17 11/26/2021   CREATININE 1.02 11/26/2021   CALCIUM 9.9 11/26/2021   GFRNONAA >60 03/05/2021   GFRAA 120 05/08/2020      Assessment and Plan  Htn = poorly controlled. Will increase lisinopril to 20mg  in addition to amlodipine 10mg . Then cardiology to reassess.  Long term medication = creatinine is stable  Wil give flu and covid vaccine   Wants to have reversal of colostomy - will refer to general surgery since now stopped drinking and smoking.

## 2022-04-28 ENCOUNTER — Ambulatory Visit (HOSPITAL_COMMUNITY)
Admission: RE | Admit: 2022-04-28 | Discharge: 2022-04-28 | Disposition: A | Discharge status: Home / Self Care | Payer: 59 | Source: Ambulatory Visit | Attending: Internal Medicine | Admitting: Internal Medicine

## 2022-04-28 DIAGNOSIS — I2699 Other pulmonary embolism without acute cor pulmonale: Secondary | ICD-10-CM | POA: Diagnosis not present

## 2022-04-28 DIAGNOSIS — I10 Essential (primary) hypertension: Secondary | ICD-10-CM | POA: Diagnosis not present

## 2022-04-28 DIAGNOSIS — Z86711 Personal history of pulmonary embolism: Secondary | ICD-10-CM

## 2022-04-28 LAB — ECHOCARDIOGRAM COMPLETE
AR max vel: 2.36 cm2
AV Area VTI: 2.37 cm2
AV Area mean vel: 2.47 cm2
AV Mean grad: 2 mmHg
AV Peak grad: 4.8 mmHg
Ao pk vel: 1.1 m/s
Area-P 1/2: 3.34 cm2
S' Lateral: 3.7 cm

## 2022-04-28 NOTE — Progress Notes (Signed)
  Echocardiogram 2D Echocardiogram has been performed.  Leon Nichols 04/28/2022, 9:33 AM

## 2022-05-04 ENCOUNTER — Ambulatory Visit: Payer: 59 | Admitting: Physician Assistant

## 2022-05-07 ENCOUNTER — Encounter: Payer: Self-pay | Admitting: Physician Assistant

## 2022-05-07 ENCOUNTER — Ambulatory Visit: Payer: 59 | Attending: Physician Assistant | Admitting: Physician Assistant

## 2022-05-07 VITALS — BP 130/90 | HR 84 | Ht 73.0 in | Wt 263.4 lb

## 2022-05-07 DIAGNOSIS — I824Z9 Acute embolism and thrombosis of unspecified deep veins of unspecified distal lower extremity: Secondary | ICD-10-CM | POA: Diagnosis not present

## 2022-05-07 DIAGNOSIS — Z86711 Personal history of pulmonary embolism: Secondary | ICD-10-CM

## 2022-05-07 DIAGNOSIS — B20 Human immunodeficiency virus [HIV] disease: Secondary | ICD-10-CM

## 2022-05-07 DIAGNOSIS — I10 Essential (primary) hypertension: Secondary | ICD-10-CM | POA: Diagnosis not present

## 2022-05-07 NOTE — Progress Notes (Signed)
Office Visit    Patient Name: Leon Nichols Date of Encounter: 05/07/2022  PCP:  Pcp, No   [place]  Cardiologist:  Early Osmond, MD  Advanced Practice Provider:  No care team member to display Electrophysiologist:  None   HPI    Leon Nichols is a 57 y.o. male with a history of HIV, hypertension, DVT/PE November 2022 presents today for follow-up visit.  Initial consultation in January 2023.  He was seen by primary care at that time.  Hospitalized November 2022 with bilateral leg swelling, DVT, and PE.  Not surprisingly his echocardiogram demonstrated moderate right ventricular dysfunction and interestingly enough severe pulmonary valve regurgitation.  He was found to have profound anemia and received 4 units of packed red blood cells at that time.  Upper endoscopy was negative and colonoscopy was incomplete.  Started on Eliquis appropriately.  He was seen by his primary care and had an incidental murmur which was detected.  Breathing was improved though he had some bilateral lower extremity edema.  He was last seen in our office 12/31/2021 and was doing well but was still short of breath with moderate exertion.  Denied chest pain, palpitations, paroxysmal nocturnal dyspnea, orthopnea.  Had some residual lower extremity swelling as well.  The plan at that time was to continue Eliquis for 6 months and obtain an echocardiogram 2 months later to assess RV function.  Also a referral was sent to cardiac rehab.  He was taken off of Eliquis and had recently been seen by infectious disease at his office appointment 12/31/2021.  Today, he states that yesterday he had some teeth pulled and that may be why his blood pressure is little elevated today.  He does not have a blood pressure cuff at home but is meaning to get 1.  We discussed getting an MR on blood pressure cuff.  He has no issues with walking or taking a flight of stairs.  He lifts weights at home and has gained  about 40 pounds over the last few weeks.  He started weight lifting heavily.  He stopped smoking.  He states he is trying to get evaluated for clearance for his upcoming surgery.  We reviewed his most recent echocardiogram.  I explained to him that he will need an official clearance from his surgeon sent to [place].  His activity level does exceed 4 METS.  Reports no shortness of breath nor dyspnea on exertion. Reports no chest pain, pressure, or tightness. No edema, orthopnea, PND. Reports no palpitations.   Past Medical History    Past Medical History:  Diagnosis Date   Acute back pain 10/28/2016   Colitis    Diverticulosis    Hemorrhoids    History of shingles    HIV (human immunodeficiency virus infection) ([place]) 03/24/2012   Hypertension    Perianal abscess 02/06/2015   Perirectal abscess    Septic arthritis ([place]) 02/05/2015   Past Surgical History:  Procedure Laterality Date   arthroscopic wash out of right knee     COLONOSCOPY  08/31/2011   Procedure: COLONOSCOPY;  Surgeon: [name], MD;  Location: [place];  Service: Endoscopy;  Laterality: N/A;   COLONOSCOPY WITH PROPOFOL N/A 02/28/2021   Procedure: COLONOSCOPY WITH PROPOFOL;  Surgeon: Arta Silence, MD;  Location: [facility];  Service: Endoscopy;  Laterality: N/A;   DRESSING CHANGE UNDER ANESTHESIA  03/27/2012   Procedure: DRESSING CHANGE UNDER ANESTHESIA;  Surgeon: [name], MD;  Location:  [facility];  Service: General;  Laterality: N/A;   ESOPHAGOGASTRODUODENOSCOPY (EGD) WITH PROPOFOL N/A 02/28/2021   Procedure: ESOPHAGOGASTRODUODENOSCOPY (EGD) WITH PROPOFOL;  Surgeon: Arta Silence, MD;  Location: [facility];  Service: Endoscopy;  Laterality: N/A;   INCISION AND DRAINAGE PERIRECTAL ABSCESS  03/23/2012   Procedure: IRRIGATION AND DEBRIDEMENT PERIRECTAL ABSCESS;  Surgeon: [name], MD;  Location: [place];  Service: General;  Laterality: N/A;  I & D soft tissue scrotal and perineal abscess   IR IVC  FILTER PLMT / S&I Burke Keels GUID/MOD SED  02/27/2021   KNEE ARTHROSCOPY Right 02/05/2015   Procedure: ARTHROSCOPIC [facility] OUT RIGHT KNEE;  Surgeon: Renette Butters, MD;  Location: [place];  Service: Orthopedics;  Laterality: Right;   LAPAROSCOPIC DIVERTED COLOSTOMY  03/27/2012   Procedure: LAPAROSCOPIC DIVERTED COLOSTOMY;  Surgeon: [name], MD;  Location: [place] OR;  Service: General;  Laterality: N/A;    Allergies  No Known Allergies   EKGs/Labs/Other Studies Reviewed:   The following studies were reviewed today:  Echocardiogram 04/28/2022  IMPRESSIONS     1. Left ventricular ejection fraction, by estimation, is 50 to 55%. The  left ventricle has low normal function. The left ventricle has no regional  wall motion abnormalities. Left ventricular diastolic parameters were  normal.   2. Right ventricular systolic function is normal. The right ventricular  size is mildly enlarged. Tricuspid regurgitation signal is inadequate for  assessing PA pressure.   3. The mitral valve is normal in structure. No evidence of mitral valve  regurgitation. No evidence of mitral stenosis.   4. The aortic valve was not well visualized. Aortic valve regurgitation  is not visualized. No aortic stenosis is present.   5. The inferior vena cava is normal in size with greater than 50%  respiratory variability, suggesting right atrial pressure of 3 mmHg.   FINDINGS   Left Ventricle: Left ventricular ejection fraction, by estimation, is 50  to 55%. The left ventricle has low normal function. The left ventricle has  no regional wall motion abnormalities. The left ventricular internal  cavity size was normal in size.  There is no left ventricular hypertrophy. Left ventricular diastolic  parameters were normal.   Right Ventricle: The right ventricular size is mildly enlarged. Right  vetricular wall thickness was not assessed. Right ventricular systolic  function is normal. Tricuspid regurgitation signal  is inadequate for  assessing PA pressure.   Left Atrium: Left atrial size was normal in size.   Right Atrium: Right atrial size was normal in size.   Pericardium: There is no evidence of pericardial effusion.   Mitral Valve: The mitral valve is normal in structure. No evidence of  mitral valve regurgitation. No evidence of mitral valve stenosis.   Tricuspid Valve: The tricuspid valve is normal in structure. Tricuspid  valve regurgitation is trivial.   Aortic Valve: The aortic valve was not well visualized. Aortic valve  regurgitation is not visualized. No aortic stenosis is present. Aortic  valve mean gradient measures 2.0 mmHg. Aortic valve peak gradient measures  4.8 mmHg. Aortic valve area, by VTI  measures 2.37 cm.   Pulmonic Valve: The pulmonic valve was not well visualized. Pulmonic valve  regurgitation is not visualized.   Aorta: The aortic root and ascending aorta are structurally normal, with  no evidence of dilitation.   Venous: The inferior vena cava is normal in size with greater than 50%  respiratory variability, suggesting right atrial pressure of 3 mmHg.   IAS/Shunts: The interatrial  septum was not well visualized.       TTE 2022:  1. Left ventricular ejection fraction, by estimation, is 50 to 55%. The left ventricle has low normal function. The left ventricle has no regional wall motion abnormalities. There is mild left ventricular hypertrophy. Left ventricular diastolic parameters were normal. 2. Right ventricular systolic function is moderately reduced. The right ventricular size is mildly enlarged. There is mildly elevated pulmonary artery systolic pressure. The estimated right ventricular systolic pressure is 43.5 mmHg. 3. The mitral valve is normal in structure. Trivial mitral valve regurgitation. No evidence of mitral stenosis. 4. The aortic valve is tricuspid. Aortic valve regurgitation is not visualized. No aortic stenosis is present. 5. Pulmonic valve  regurgitation is severe. 6. The inferior vena cava is dilated in size with <50% respiratory variability, suggesting right atrial pressure of 15 mmHg. size was normal in size.    EKG:  EKG is ordered today.  Echocardiogram shows normal sinus rhythm, rate 84 bpm  Recent Labs: 11/26/2021: ALT 7; BUN 17; Creat 1.02; Hemoglobin 17.3; Platelets 226; Potassium 4.2; Sodium 137  Recent Lipid Panel    Component Value Date/Time   CHOL 207 (H) 11/26/2021 1134   TRIG 121 11/26/2021 1134   HDL 69 11/26/2021 1134   CHOLHDL 3.0 11/26/2021 1134   VLDL 13 02/07/2015 0530   LDLCALC 115 (H) 11/26/2021 1134    Home Medications   Current Meds  Medication Sig   abacavir-dolutegravir-lamiVUDine (TRIUMEQ) 600-50-300 MG tablet Take 1 tablet by mouth daily.   amLODipine (NORVASC) 10 MG tablet TAKE 1 TABLET(10 MG) BY MOUTH DAILY   lisinopril (ZESTRIL) 20 MG tablet Take 1 tablet (20 mg total) by mouth daily.   pantoprazole (PROTONIX) 40 MG tablet Take 1 tablet (40 mg total) by mouth daily.   rosuvastatin (CRESTOR) 10 MG tablet Take 1 tablet (10 mg total) by mouth daily.     Review of Systems      All other systems reviewed and are otherwise negative except as noted above.  Physical Exam    VS:  BP (!) 130/90   Pulse 84   Ht 6\' 1"  (1.854 m)   Wt 263 lb 6.4 oz (119.5 kg)   SpO2 98%   BMI 34.75 kg/m  , BMI Body mass index is 34.75 kg/m.  Wt Readings from Last 3 Encounters:  05/07/22 263 lb 6.4 oz (119.5 kg)  03/18/22 246 lb (111.6 kg)  12/15/21 219 lb (99.3 kg)     GEN: Well nourished, well developed, in no acute distress. HEENT: normal. Neck: Supple, no JVD, carotid bruits, or masses. Cardiac: RRR, no murmurs, rubs, or gallops. No clubbing, cyanosis, edema. Radials/PT 2+ and equal bilaterally.  Respiratory:  Respirations regular and unlabored, clear to auscultation bilaterally. GI: Soft, nontender, nondistended. MS: No deformity or atrophy. Skin: Warm and dry, no rash. Neuro:  Strength  and sensation are intact. Psych: Normal affect.  Assessment & Plan    Hx of DVT/PE -off the Eliquis with no further issues -no swelling today, no SOB  Hypertension -His diastolic blood pressure was a little elevated today in the clinic (138/100) -On retake blood pressure was 130/90 -Recommended that he get an Omron blood pressure cuff and take his blood pressure at home once daily an hour after medicines and send me those values -Recommend low-sodium diet -Continue amlodipine 10 mg daily and lisinopril 20 mg daily  HIV -follows up with ID  HYPERTENSION CONTROL Vitals:   05/07/22 1431 05/07/22 1629  BP: 07/06/22)  138/100 (!) 130/90    The patient's blood pressure is elevated above target today.  In order to address the patient's elevated BP: Blood pressure will be monitored at home to determine if medication changes need to be made.         Disposition: Follow up 3-4 with Early Osmond, MD or APP.  Signed, Elgie Collard, PA-C 05/07/2022, 4:30 PM [facility]

## 2022-05-07 NOTE — Patient Instructions (Addendum)
Medication Instructions:  Your physician recommends that you continue on your current medications as directed. Please refer to the Current Medication list given to you today.  *If you need a refill on your cardiac medications before your next appointment, please call your pharmacy*   Lab Work: None ordered If you have labs (blood work) drawn today and your tests are completely normal, you will receive your results only by: [place] (if you have MyChart) OR A paper copy in the mail If you have any lab test that is abnormal or we need to change your treatment, we will call you to review the results.   Follow-Up: At [facility], you and your health needs are our priority.  As part of our continuing mission to provide you with exceptional heart care, we have created designated Provider Care Teams.  These Care Teams include your primary Cardiologist (physician) and Advanced Practice Providers (APPs -  Physician Assistants and Nurse Practitioners) who all work together to provide you with the care you need, when you need it.  We recommend signing up for the patient portal called "MyChart".  Sign up information is provided on this After Visit Summary.  MyChart is used to connect with patients for Virtual Visits (Telemedicine).  Patients are able to view lab/test results, encounter notes, upcoming appointments, etc.  Non-urgent messages can be sent to your provider as well.   To learn more about what you can do with MyChart, go to [email or web address].    Your next appointment:   3-4 month(s)  Provider:   Early Osmond, MD    Other Instructions 1.Omron is the brand of home blood pressure monitor that we discussed today. 2.Check your blood pressure daily, one hour after taking your morning medications for the next two weeks and keep a log. Call us with the readings at the end of the two weeks.

## 2022-05-24 ENCOUNTER — Other Ambulatory Visit: Payer: Self-pay | Admitting: Internal Medicine

## 2022-05-25 LAB — VITAMIN D 25 HYDROXY (VIT D DEFICIENCY, FRACTURES): Vit D, 25-Hydroxy: 23 ng/mL — ABNORMAL LOW (ref 30–100)

## 2022-05-25 LAB — COMPLETE METABOLIC PANEL WITH GFR
AG Ratio: 1.2 (calc) (ref 1.0–2.5)
ALT: 14 U/L (ref 9–46)
AST: 19 U/L (ref 10–35)
Albumin: 4.1 g/dL (ref 3.6–5.1)
Alkaline phosphatase (APISO): 77 U/L (ref 35–144)
BUN: 18 mg/dL (ref 7–25)
CO2: 22 mmol/L (ref 20–32)
Calcium: 9.2 mg/dL (ref 8.6–10.3)
Chloride: 103 mmol/L (ref 98–110)
Creat: 1.03 mg/dL (ref 0.70–1.30)
Globulin: 3.3 g/dL (calc) (ref 1.9–3.7)
Glucose, Bld: 123 mg/dL — ABNORMAL HIGH (ref 65–99)
Potassium: 4.5 mmol/L (ref 3.5–5.3)
Sodium: 138 mmol/L (ref 135–146)
Total Bilirubin: 0.4 mg/dL (ref 0.2–1.2)
Total Protein: 7.4 g/dL (ref 6.1–8.1)
eGFR: 86 mL/min/{1.73_m2} (ref >=60)

## 2022-05-25 LAB — CBC
HCT: 41.3 % (ref 38.5–50.0)
Hemoglobin: 14.3 g/dL (ref 13.2–17.1)
MCH: 31.8 pg (ref 27.0–33.0)
MCHC: 34.6 g/dL (ref 32.0–36.0)
MCV: 91.8 fL (ref 80.0–100.0)
MPV: 10.6 fL (ref 7.5–12.5)
Platelets: 253 10*3/uL (ref 140–400)
RBC: 4.5 10*6/uL (ref 4.20–5.80)
RDW: 12.8 % (ref 11.0–15.0)
WBC: 7.2 10*3/uL (ref 3.8–10.8)

## 2022-05-25 LAB — LIPID PANEL
Cholesterol: 97 mg/dL (ref <200)
HDL: 43 mg/dL (ref >=40)
LDL Cholesterol (Calc): 39 mg/dL (calc)
Non-HDL Cholesterol (Calc): 54 mg/dL (calc) (ref <130)
Total CHOL/HDL Ratio: 2.3 (calc) (ref <5.0)
Triglycerides: 70 mg/dL (ref <150)

## 2022-05-25 LAB — TSH: TSH: 2.46 mIU/L (ref 0.40–4.50)

## 2022-05-25 LAB — PSA: PSA: 0.48 ng/mL (ref <=4.00)

## 2022-06-15 ENCOUNTER — Telehealth: Payer: Self-pay

## 2022-06-15 NOTE — Telephone Encounter (Signed)
Received call from Elmyra Ricks, nurse with [facility], requesting letter stating that patient's labs are optimized and that it's okay for surgeon to proceed with colostomy reversal surgery.   Letter should be faxed Attn. To Kelly/Triage at [phone].  Beryle Flock, RN

## 2022-06-15 NOTE — Telephone Encounter (Signed)
Letter faxed to [facility] and received confirmation.

## 2022-09-13 ENCOUNTER — Other Ambulatory Visit (HOSPITAL_COMMUNITY): Payer: Self-pay | Admitting: General Surgery

## 2022-09-13 DIAGNOSIS — Z933 Colostomy status: Secondary | ICD-10-CM

## 2022-09-21 ENCOUNTER — Ambulatory Visit (HOSPITAL_COMMUNITY)
Admission: RE | Admit: 2022-09-21 | Discharge: 2022-09-21 | Disposition: A | Discharge status: Home / Self Care | Payer: 59 | Source: Ambulatory Visit | Attending: General Surgery | Admitting: General Surgery

## 2022-09-21 DIAGNOSIS — Z933 Colostomy status: Secondary | ICD-10-CM | POA: Insufficient documentation

## 2022-09-21 MED ORDER — GADOBUTROL 1 MMOL/ML IV SOLN
10.0000 mL | Freq: Once | INTRAVENOUS | Status: AC | PRN
  Administered 2022-09-21: 10 mL via INTRAVENOUS

## 2022-10-12 ENCOUNTER — Ambulatory Visit: Payer: Self-pay | Admitting: General Surgery

## 2022-10-12 NOTE — H&P (Signed)
PROVIDER:  Elenora Gamma, MD   MRN: [id number] DOB: [birth year]    Subjective   Chief Complaint: NEW PROBLEM (Discuss ostomy reversal)       History of Present Illness: Leon Nichols is a 57 y.o. male who is seen today as an office consultation for evaluation of NEW PROBLEM (Discuss ostomy reversal) .  Patient underwent placement of a loop colostomy in 2013 for severe perianal abscess and fistula.  He states this took approximately 2 months to heal.  He has not had any further perianal issues since then.  He did undergo a colonoscopy approximately 2 years ago which was relatively normal.  Patient has HIV and was seen in the office in 2020.  He was found to be rather noncompliant with his medications and I recommended establishing better care with ID prior to proceeding with surgery.He has a history of a PE and DVT in 2022.  Now off all anticoagulation.  He has been seen by cardiology for hypertension and ID for his HIV.       Review of Systems: A complete review of systems was obtained from the patient.  I have reviewed this information and discussed as appropriate with the patient.  See HPI as well for other ROS.       Medical History: Past Medical History Past Medical History: Diagnosis Date  DVT (deep venous thrombosis) (CMS/HHS-HCC)    HIV -AIDS with opportunistic infection, Symptomatic (CMS/HHS-HCC)    Hypertension         Problem List There is no problem list on file for this patient.     Past Surgical History Past Surgical History: Procedure Laterality Date  COLON SURGERY          Allergies No Known Allergies    Medications Ordered Prior to Encounter Current Outpatient Medications on File Prior to Visit Medication Sig Dispense Refill  amLODIPine (NORVASC) 10 MG tablet Take 10 mg by mouth once daily      lisinopriL (ZESTRIL) 20 MG tablet Take 20 mg by mouth once daily      rosuvastatin (CRESTOR) 10 MG tablet Take 1 tablet by mouth once daily       TRIUMEQ 600-50-300 mg tablet Take 1 tablet by mouth once daily        No current facility-administered medications on file prior to visit.      Family History Family History Problem Relation Age of Onset  High blood pressure (Hypertension) Mother        Tobacco Use History Social History    Tobacco Use Smoking Status Former  Types: Cigarettes Smokeless Tobacco Never      Social History Social History    Socioeconomic History  Marital status: Single Tobacco Use  Smoking status: Former     Types: Cigarettes  Smokeless tobacco: Never Vaping Use  Vaping status: Never Used Substance and Sexual Activity  Alcohol use: Not Currently  Drug use: Never      Objective:     Vitals:    BP: (!) 148/96 Pulse: 83 Temp: 36.5 C (97.7 F) SpO2: 98% Weight: (!) 119.8 kg (264 lb 3.2 oz) Height: 185.4 cm (6\' 1" ) PainSc: 0-No pain     Exam Gen: NAD Abd: soft, colostomy in place Rectal: Left lateral perianal thickening and purulence noted.     Labs, Imaging and Diagnostic Testing: CT scan from August 2023 reviewed.  Patient has no signs of parastomal hernia.   Assessment and Plan: Diagnoses and all orders for this visit:  Colostomy in place (CMS/HHS-HCC)     57 year old male with HIV who presented to the office for evaluation for colostomy closure.  He has had a loop colostomy since 2013 for a severe perianal infection with possible fistula.  On exam, he does have some left lateral swelling.  MRI did not show any fistula, but may have some quiescent hydradenitis.   we will proceed with colostomy closure.  Risks include bleeding, leak, hernia and recurrence of anal disease.   Vanita Panda, MD Colon and Rectal Surgery [facility]

## 2022-10-12 NOTE — H&P (View-Only) (Signed)
PROVIDER:  Elenora Gamma, MD   MRN: [id number] DOB: [birth year]    Subjective   Chief Complaint: NEW PROBLEM (Discuss ostomy reversal)       History of Present Illness: Leon Nichols is a 57 y.o. male who is seen today as an office consultation for evaluation of NEW PROBLEM (Discuss ostomy reversal) .  Patient underwent placement of a loop colostomy in 2013 for severe perianal abscess and fistula.  He states this took approximately 2 months to heal.  He has not had any further perianal issues since then.  He did undergo a colonoscopy approximately 2 years ago which was relatively normal.  Patient has HIV and was seen in the office in 2020.  He was found to be rather noncompliant with his medications and I recommended establishing better care with ID prior to proceeding with surgery.He has a history of a PE and DVT in 2022.  Now off all anticoagulation.  He has been seen by cardiology for hypertension and ID for his HIV.       Review of Systems: A complete review of systems was obtained from the patient.  I have reviewed this information and discussed as appropriate with the patient.  See HPI as well for other ROS.       Medical History: Past Medical History Past Medical History: Diagnosis Date  DVT (deep venous thrombosis) (CMS/HHS-HCC)    HIV -AIDS with opportunistic infection, Symptomatic (CMS/HHS-HCC)    Hypertension         Problem List There is no problem list on file for this patient.     Past Surgical History Past Surgical History: Procedure Laterality Date  COLON SURGERY          Allergies No Known Allergies    Medications Ordered Prior to Encounter Current Outpatient Medications on File Prior to Visit Medication Sig Dispense Refill  amLODIPine (NORVASC) 10 MG tablet Take 10 mg by mouth once daily      lisinopriL (ZESTRIL) 20 MG tablet Take 20 mg by mouth once daily      rosuvastatin (CRESTOR) 10 MG tablet Take 1 tablet by mouth once daily       TRIUMEQ 600-50-300 mg tablet Take 1 tablet by mouth once daily        No current facility-administered medications on file prior to visit.      Family History Family History Problem Relation Age of Onset  High blood pressure (Hypertension) Mother        Tobacco Use History Social History    Tobacco Use Smoking Status Former  Types: Cigarettes Smokeless Tobacco Never      Social History Social History    Socioeconomic History  Marital status: Single Tobacco Use  Smoking status: Former     Types: Cigarettes  Smokeless tobacco: Never Vaping Use  Vaping status: Never Used Substance and Sexual Activity  Alcohol use: Not Currently  Drug use: Never      Objective:     Vitals:    BP: (!) 148/96 Pulse: 83 Temp: 36.5 C (97.7 F) SpO2: 98% Weight: (!) 119.8 kg (264 lb 3.2 oz) Height: 185.4 cm (6\' 1" ) PainSc: 0-No pain     Exam Gen: NAD Abd: soft, colostomy in place Rectal: Left lateral perianal thickening and purulence noted.     Labs, Imaging and Diagnostic Testing: CT scan from August 2023 reviewed.  Patient has no signs of parastomal hernia.   Assessment and Plan: Diagnoses and all orders for this visit:  Colostomy in place (CMS/HHS-HCC)     57 year old male with HIV who presented to the office for evaluation for colostomy closure.  He has had a loop colostomy since 2013 for a severe perianal infection with possible fistula.  On exam, he does have some left lateral swelling.  MRI did not show any fistula, but may have some quiescent hydradenitis.   we will proceed with colostomy closure.  Risks include bleeding, leak, hernia and recurrence of anal disease.   Leon Panda, MD Colon and Rectal Surgery [facility]

## 2022-10-12 NOTE — Progress Notes (Signed)
Surgery orders requested via Epic inbox. °

## 2022-10-13 NOTE — Patient Instructions (Signed)
SURGICAL WAITING ROOM VISITATION  Patients having surgery or a procedure may have no more than 2 support people in the waiting area - these visitors may rotate.    Children under the age of 86 must have an adult with them who is not the patient.  Due to an increase in RSV and influenza rates and associated hospitalizations, children ages 37 and under may not visit patients in [facility] hospitals.  If the patient needs to stay at the hospital during part of their recovery, the visitor guidelines for inpatient rooms apply. Pre-op nurse will coordinate an appropriate time for 1 support person to accompany patient in pre-op.  This support person may not rotate.    Please refer to the [facility] website for the visitor guidelines for Inpatients (after your surgery is over and you are in a regular room).    Your procedure is scheduled on: 10/27/22   Report to [facility] Main Entrance    Report to admitting at 12:15 PM   Call this number if you have problems the morning of surgery [phone]   Follow a clear liquid diet the day before surgery   You may have the following liquids until 11:30 AM DAY OF SURGERY  Water Non-Citrus Juices (without pulp, NO RED-Apple, White grape, White cranberry) Black Coffee (NO MILK/CREAM OR CREAMERS, sugar ok)  Clear Tea (NO MILK/CREAM OR CREAMERS, sugar ok) regular and decaf                             Plain Jell-O (NO RED)                                           Fruit ices (not with fruit pulp, NO RED)                                     Popsicles (NO RED)                                                               Sports drinks like Gatorade (NO RED)              Drink 2 Ensure drinks AT 10:00 PM the night before surgery.        The day of surgery:  Drink ONE (1) Pre-Surgery Clear Ensure at 11:30 AM the morning of surgery. Drink in one sitting. Do not sip.  This drink was given to you during your hospital  pre-op appointment  visit. Nothing else to drink after completing the  Pre-Surgery Clear Ensure          If you have questions, please contact your surgeon's office.   FOLLOW BOWEL PREP AND ANY ADDITIONAL PRE OP INSTRUCTIONS YOU RECEIVED FROM YOUR SURGEON'S OFFICE!!!     Oral Hygiene is also important to reduce your risk of infection.  Remember - BRUSH YOUR TEETH THE MORNING OF SURGERY WITH YOUR REGULAR TOOTHPASTE  DENTURES WILL BE REMOVED PRIOR TO SURGERY PLEASE DO NOT APPLY "Poly grip" OR ADHESIVES!!!   Take these medicines the morning of surgery with A SIP OF WATER: Triumeq, Amlodipine, Pantoprazole, Rosuvastatin                               You may not have any metal on your body including jewelry, and body piercing             Do not wear lotions, powders, cologne, or deodorant              Men may shave face and neck.   Do not bring valuables to the hospital. [facility] IS NOT             RESPONSIBLE   FOR VALUABLES.   Contacts, glasses, dentures or bridgework may not be worn into surgery.   Bring small overnight bag day of surgery.   DO NOT BRING YOUR HOME MEDICATIONS TO THE HOSPITAL. PHARMACY WILL DISPENSE MEDICATIONS LISTED ON YOUR MEDICATION LIST TO YOU DURING YOUR ADMISSION IN THE HOSPITAL!              Please read over the following fact sheets you were given: IF YOU HAVE QUESTIONS ABOUT YOUR PRE-OP INSTRUCTIONS PLEASE CALL [name]    If you received a COVID test during your pre-op visit  it is requested that you wear a mask when out in public, stay away from anyone that may not be feeling well and notify your surgeon if you develop symptoms. If you test positive for Covid or have been in contact with anyone that has tested positive in the last 10 days please notify you surgeon.    [facility] - Preparing for Surgery Before surgery, you can play an important role.  Because skin is not sterile, your skin needs to be as free of germs as  possible.  You can reduce the number of germs on your skin by washing with CHG (chlorahexidine gluconate) soap before surgery.  CHG is an antiseptic cleaner which kills germs and bonds with the skin to continue killing germs even after washing. Please DO NOT use if you have an allergy to CHG or antibacterial soaps.  If your skin becomes reddened/irritated stop using the CHG and inform your nurse when you arrive at Short Stay. Do not shave (including legs and underarms) for at least 48 hours prior to the first CHG shower.  You may shave your face/neck.  Please follow these instructions carefully:  1.  Shower with CHG Soap the night before surgery and the  morning of surgery.  2.  If you choose to wash your hair, wash your hair first as usual with your normal  shampoo.  3.  After you shampoo, rinse your hair and body thoroughly to remove the shampoo.                             4.  Use CHG as you would any other liquid soap.  You can apply chg directly to the skin and wash.  Gently with a scrungie or clean washcloth.  5.  Apply the CHG Soap to your body ONLY FROM THE NECK DOWN.   Do   not use on face/ open  Wound or open sores. Avoid contact with eyes, ears mouth and   genitals (private parts).                       Wash face,  Genitals (private parts) with your normal soap.             6.  Wash thoroughly, paying special attention to the area where your    surgery  will be performed.  7.  Thoroughly rinse your body with warm water from the neck down.  8.  DO NOT shower/wash with your normal soap after using and rinsing off the CHG Soap.                9.  Pat yourself dry with a clean towel.            10.  Wear clean pajamas.            11.  Place clean sheets on your bed the night of your first shower and do not  sleep with pets. Day of Surgery : Do not apply any lotions/deodorants the morning of surgery.  Please wear clean clothes to the hospital/surgery  center.  FAILURE TO FOLLOW THESE INSTRUCTIONS MAY RESULT IN THE CANCELLATION OF YOUR SURGERY  PATIENT SIGNATURE_________________________________  NURSE SIGNATURE__________________________________  ________________________________________________________________________  Leon Nichols  An incentive spirometer is a tool that can help keep your lungs clear and active. This tool measures how well you are filling your lungs with each breath. Taking long deep breaths may help reverse or decrease the chance of developing breathing (pulmonary) problems (especially infection) following: A long period of time when you are unable to move or be active. BEFORE THE PROCEDURE  If the spirometer includes an indicator to show your best effort, your nurse or respiratory therapist will set it to a desired goal. If possible, sit up straight or lean slightly forward. Try not to slouch. Hold the incentive spirometer in an upright position. INSTRUCTIONS FOR USE  Sit on the edge of your bed if possible, or sit up as far as you can in bed or on a chair. Hold the incentive spirometer in an upright position. Breathe out normally. Place the mouthpiece in your mouth and seal your lips tightly around it. Breathe in slowly and as deeply as possible, raising the piston or the ball toward the top of the column. Hold your breath for 3-5 seconds or for as long as possible. Allow the piston or ball to fall to the bottom of the column. Remove the mouthpiece from your mouth and breathe out normally. Rest for a few seconds and repeat Steps 1 through 7 at least 10 times every 1-2 hours when you are awake. Take your time and take a few normal breaths between deep breaths. The spirometer may include an indicator to show your best effort. Use the indicator as a goal to work toward during each repetition. After each set of 10 deep breaths, practice coughing to be sure your lungs are clear. If you have an incision (the cut  made at the time of surgery), support your incision when coughing by placing a pillow or rolled up towels firmly against it. Once you are able to get out of bed, walk around indoors and cough well. You may stop using the incentive spirometer when instructed by your caregiver.  RISKS AND COMPLICATIONS Take your time so you do not get dizzy or light-headed. If you are in pain, you may  need to take or ask for pain medication before doing incentive spirometry. It is harder to take a deep breath if you are having pain. AFTER USE Rest and breathe slowly and easily. It can be helpful to keep track of a log of your progress. Your caregiver can provide you with a simple table to help with this. If you are using the spirometer at home, follow these instructions: SEEK MEDICAL CARE IF:  You are having difficultly using the spirometer. You have trouble using the spirometer as often as instructed. Your pain medication is not giving enough relief while using the spirometer. You develop fever of 100.5 F (38.1 C) or higher. SEEK IMMEDIATE MEDICAL CARE IF:  You cough up bloody sputum that had not been present before. You develop fever of 102 F (38.9 C) or greater. You develop worsening pain at or near the incision site. MAKE SURE YOU:  Understand these instructions. Will watch your condition. Will get help right away if you are not doing well or get worse. Document Released: 08/02/2006 Document Revised: 06/14/2011 Document Reviewed: 10/03/2006 [employer] Patient Information 2014 [employer], [place].   ________________________________________________________________________ WHAT IS A BLOOD TRANSFUSION? Blood Transfusion Information  A transfusion is the replacement of blood or some of its parts. Blood is made up of multiple cells which provide different functions. Red blood cells carry oxygen and are used for blood loss replacement. White blood cells fight against infection. Platelets control  bleeding. Plasma helps clot blood. Other blood products are available for specialized needs, such as hemophilia or other clotting disorders. BEFORE THE TRANSFUSION  Who gives blood for transfusions?  Healthy volunteers who are fully evaluated to make sure their blood is safe. This is blood bank blood. Transfusion therapy is the safest it has ever been in the practice of medicine. Before blood is taken from a donor, a complete history is taken to make sure that person has no history of diseases nor engages in risky social behavior (examples are intravenous drug use or sexual activity with multiple partners). The donor's travel history is screened to minimize risk of transmitting infections, such as malaria. The donated blood is tested for signs of infectious diseases, such as HIV and hepatitis. The blood is then tested to be sure it is compatible with you in order to minimize the chance of a transfusion reaction. If you or a relative donates blood, this is often done in anticipation of surgery and is not appropriate for emergency situations. It takes many days to process the donated blood. RISKS AND COMPLICATIONS Although transfusion therapy is very safe and saves many lives, the main dangers of transfusion include:  Getting an infectious disease. Developing a transfusion reaction. This is an allergic reaction to something in the blood you were given. Every precaution is taken to prevent this. The decision to have a blood transfusion has been considered carefully by your caregiver before blood is given. Blood is not given unless the benefits outweigh the risks. AFTER THE TRANSFUSION Right after receiving a blood transfusion, you will usually feel much better and more energetic. This is especially true if your red blood cells have gotten low (anemic). The transfusion raises the level of the red blood cells which carry oxygen, and this usually causes an energy increase. The nurse administering the  transfusion will monitor you carefully for complications. HOME CARE INSTRUCTIONS  No special instructions are needed after a transfusion. You may find your energy is better. Speak with your caregiver about any limitations on activity for  underlying diseases you may have. SEEK MEDICAL CARE IF:  Your condition is not improving after your transfusion. You develop redness or irritation at the intravenous (IV) site. SEEK IMMEDIATE MEDICAL CARE IF:  Any of the following symptoms occur over the next 12 hours: Shaking chills. You have a temperature by mouth above 102 F (38.9 C), not controlled by medicine. Chest, back, or muscle pain. People around you feel you are not acting correctly or are confused. Shortness of breath or difficulty breathing. Dizziness and fainting. You get a rash or develop hives. You have a decrease in urine output. Your urine turns a dark color or changes to pink, red, or brown. Any of the following symptoms occur over the next 10 days: You have a temperature by mouth above 102 F (38.9 C), not controlled by medicine. Shortness of breath. Weakness after normal activity. The white part of the eye turns yellow (jaundice). You have a decrease in the amount of urine or are urinating less often. Your urine turns a dark color or changes to pink, red, or brown. Document Released: 03/19/2000 Document Revised: 06/14/2011 Document Reviewed: 11/06/2007 [facility] ([facility] Patient Information 2014 [place], [place].  _______________________________________________________________________

## 2022-10-13 NOTE — Progress Notes (Signed)
COVID Vaccine Completed: yes  Date of COVID positive in last 90 days:  PCP -  Cardiologist - Alverda Skeans, MD Infectious Disease- [name], MD  Chest x-ray -  EKG - 05/07/22 Epic Stress Test -  ECHO - 04/28/22 Epic Cardiac Cath -  Pacemaker/ICD device last checked: Spinal Cord Stimulator:  Bowel Prep - Miralax, antibiotics, clears day before  Sleep Study -  CPAP -   Fasting Blood Sugar -  Checks Blood Sugar _____ times a day  Last dose of GLP1 agonist-  N/A GLP1 instructions:  N/A   Last dose of SGLT-2 inhibitors-  N/A SGLT-2 instructions: N/A   Blood Thinner Instructions:  Time Aspirin Instructions: Last Dose:  Activity level:  Can go up a flight of stairs and perform activities of daily living without stopping and without symptoms of chest pain or shortness of breath.  Able to exercise without symptoms  Unable to go up a flight of stairs without symptoms of     Anesthesia review: need cardiac and I&D clearance, HTN, PE, DVT, HIV  Patient denies shortness of breath, fever, cough and chest pain at PAT appointment  Patient verbalized understanding of instructions that were given to them at the PAT appointment. Patient was also instructed that they will need to review over the PAT instructions again at home before surgery.

## 2022-10-14 ENCOUNTER — Encounter (HOSPITAL_COMMUNITY)
Admission: RE | Admit: 2022-10-14 | Discharge: 2022-10-14 | Disposition: A | Discharge status: Home / Self Care | Payer: 59 | Source: Ambulatory Visit | Attending: General Surgery | Admitting: General Surgery

## 2022-10-14 ENCOUNTER — Other Ambulatory Visit: Payer: Self-pay

## 2022-10-14 ENCOUNTER — Encounter (HOSPITAL_COMMUNITY): Payer: Self-pay

## 2022-10-14 VITALS — BP 138/97 | HR 79 | Temp 98.2°F | Resp 18 | Ht 73.0 in | Wt 263.0 lb

## 2022-10-14 DIAGNOSIS — I10 Essential (primary) hypertension: Secondary | ICD-10-CM | POA: Insufficient documentation

## 2022-10-14 DIAGNOSIS — Z01812 Encounter for preprocedural laboratory examination: Secondary | ICD-10-CM | POA: Diagnosis present

## 2022-10-14 DIAGNOSIS — Z01818 Encounter for other preprocedural examination: Secondary | ICD-10-CM

## 2022-10-14 HISTORY — DX: Acute embolism and thrombosis of unspecified deep veins of unspecified lower extremity: I82.409

## 2022-10-14 LAB — BASIC METABOLIC PANEL
Anion gap: 7 (ref 5–15)
BUN: 21 mg/dL — ABNORMAL HIGH (ref 6–20)
CO2: 23 mmol/L (ref 22–32)
Calcium: 8.9 mg/dL (ref 8.9–10.3)
Chloride: 106 mmol/L (ref 98–111)
Creatinine, Ser: 0.92 mg/dL (ref 0.61–1.24)
GFR, Estimated: >60 mL/min (ref >60)
Glucose, Bld: 161 mg/dL — ABNORMAL HIGH (ref 70–99)
Potassium: 3.6 mmol/L (ref 3.5–5.1)
Sodium: 136 mmol/L (ref 135–145)

## 2022-10-14 LAB — CBC
HCT: 44.4 % (ref 39.0–52.0)
Hemoglobin: 14.6 g/dL (ref 13.0–17.0)
MCH: 30.7 pg (ref 26.0–34.0)
MCHC: 32.9 g/dL (ref 30.0–36.0)
MCV: 93.5 fL (ref 80.0–100.0)
Platelets: 213 10*3/uL (ref 150–400)
RBC: 4.75 MIL/uL (ref 4.22–5.81)
RDW: 14.7 % (ref 11.5–15.5)
WBC: 5.9 10*3/uL (ref 4.0–10.5)
nRBC: 0 % (ref 0.0–0.2)

## 2022-10-14 LAB — TYPE AND SCREEN
ABO/RH(D): O POS
Antibody Screen: NEGATIVE

## 2022-10-27 ENCOUNTER — Other Ambulatory Visit: Payer: Self-pay

## 2022-10-27 ENCOUNTER — Inpatient Hospital Stay (HOSPITAL_COMMUNITY): Payer: 59

## 2022-10-27 ENCOUNTER — Encounter (HOSPITAL_COMMUNITY)
Admission: RE | Disposition: A | Discharge status: Home / Self Care | Payer: Self-pay | Source: Home / Self Care | Attending: General Surgery

## 2022-10-27 ENCOUNTER — Inpatient Hospital Stay (HOSPITAL_COMMUNITY): Payer: 59 | Admitting: Physician Assistant

## 2022-10-27 ENCOUNTER — Encounter (HOSPITAL_COMMUNITY): Payer: Self-pay | Admitting: General Surgery

## 2022-10-27 ENCOUNTER — Inpatient Hospital Stay (HOSPITAL_COMMUNITY): Payer: 59 | Admitting: Anesthesiology

## 2022-10-27 ENCOUNTER — Inpatient Hospital Stay (HOSPITAL_COMMUNITY)
Admission: RE | Admit: 2022-10-27 | Discharge: 2022-10-31 | DRG: 330 | Disposition: A | Discharge status: Home / Self Care | Payer: 59 | Attending: General Surgery | Admitting: General Surgery

## 2022-10-27 DIAGNOSIS — Z86711 Personal history of pulmonary embolism: Secondary | ICD-10-CM | POA: Diagnosis not present

## 2022-10-27 DIAGNOSIS — Z86718 Personal history of other venous thrombosis and embolism: Secondary | ICD-10-CM | POA: Diagnosis not present

## 2022-10-27 DIAGNOSIS — Z79899 Other long term (current) drug therapy: Secondary | ICD-10-CM | POA: Diagnosis not present

## 2022-10-27 DIAGNOSIS — I10 Essential (primary) hypertension: Secondary | ICD-10-CM | POA: Diagnosis present

## 2022-10-27 DIAGNOSIS — E669 Obesity, unspecified: Secondary | ICD-10-CM | POA: Diagnosis present

## 2022-10-27 DIAGNOSIS — Z6834 Body mass index (BMI) 34.0-34.9, adult: Secondary | ICD-10-CM

## 2022-10-27 DIAGNOSIS — Z433 Encounter for attention to colostomy: Principal | ICD-10-CM

## 2022-10-27 DIAGNOSIS — Z87891 Personal history of nicotine dependence: Secondary | ICD-10-CM

## 2022-10-27 DIAGNOSIS — Z8249 Family history of ischemic heart disease and other diseases of the circulatory system: Secondary | ICD-10-CM | POA: Diagnosis not present

## 2022-10-27 DIAGNOSIS — B20 Human immunodeficiency virus [HIV] disease: Secondary | ICD-10-CM | POA: Diagnosis present

## 2022-10-27 DIAGNOSIS — Z91148 Patient's other noncompliance with medication regimen for other reason: Secondary | ICD-10-CM | POA: Diagnosis not present

## 2022-10-27 DIAGNOSIS — Z933 Colostomy status: Secondary | ICD-10-CM | POA: Diagnosis not present

## 2022-10-27 HISTORY — PX: COLOSTOMY CLOSURE: SHX1381

## 2022-10-27 SURGERY — COLOSTOMY CLOSURE
Anesthesia: General

## 2022-10-27 MED ORDER — 0.9 % SODIUM CHLORIDE (POUR BTL) OPTIME
TOPICAL | Status: DC | PRN
  Administered 2022-10-27: 1000 mL

## 2022-10-27 MED ORDER — DIPHENHYDRAMINE HCL 12.5 MG/5ML PO ELIX: 12.5000 mg | ORAL_SOLUTION | Freq: Four times a day (QID) | ORAL | Status: DC | PRN

## 2022-10-27 MED ORDER — ENSURE PRE-SURGERY PO LIQD
296.0000 mL | Freq: Once | ORAL | Status: DC
  Filled 2022-10-27: qty 296

## 2022-10-27 MED ORDER — ALVIMOPAN 12 MG PO CAPS
12.0000 mg | ORAL_CAPSULE | Freq: Two times a day (BID) | ORAL | Status: DC
  Administered 2022-10-28 – 2022-10-31 (×5): 12 mg via ORAL
  Filled 2022-10-27 (×6): qty 1

## 2022-10-27 MED ORDER — ONDANSETRON HCL 4 MG PO TABS
4.0000 mg | ORAL_TABLET | Freq: Four times a day (QID) | ORAL | Status: DC | PRN
  Administered 2022-10-31: 4 mg via ORAL
  Filled 2022-10-27: qty 1

## 2022-10-27 MED ORDER — OXYCODONE HCL 5 MG PO TABS
5.0000 mg | ORAL_TABLET | ORAL | Status: DC | PRN
  Administered 2022-10-27 – 2022-10-28 (×2): 10 mg via ORAL
  Administered 2022-10-28: 5 mg via ORAL
  Administered 2022-10-28 – 2022-10-31 (×8): 10 mg via ORAL
  Filled 2022-10-27 (×9): qty 2
  Filled 2022-10-27: qty 1
  Filled 2022-10-27: qty 2

## 2022-10-27 MED ORDER — AMLODIPINE BESYLATE 10 MG PO TABS
10.0000 mg | ORAL_TABLET | Freq: Every day | ORAL | Status: DC
  Administered 2022-10-28 – 2022-10-31 (×4): 10 mg via ORAL
  Filled 2022-10-27 (×4): qty 1

## 2022-10-27 MED ORDER — ORAL CARE MOUTH RINSE: 15.0000 mL | Freq: Once | OROMUCOSAL | Status: AC

## 2022-10-27 MED ORDER — GABAPENTIN 300 MG PO CAPS
300.0000 mg | ORAL_CAPSULE | ORAL | Status: AC
  Administered 2022-10-27: 300 mg via ORAL
  Filled 2022-10-27: qty 1

## 2022-10-27 MED ORDER — ENSURE PRE-SURGERY PO LIQD
592.0000 mL | Freq: Once | ORAL | Status: DC
  Filled 2022-10-27: qty 592

## 2022-10-27 MED ORDER — PHENYLEPHRINE 80 MCG/ML (10ML) SYRINGE FOR IV PUSH (FOR BLOOD PRESSURE SUPPORT)
PREFILLED_SYRINGE | INTRAVENOUS | Status: DC | PRN
  Administered 2022-10-27 (×2): 80 ug via INTRAVENOUS

## 2022-10-27 MED ORDER — FENTANYL CITRATE (PF) 100 MCG/2ML IJ SOLN
INTRAMUSCULAR | Status: AC
  Filled 2022-10-27: qty 2

## 2022-10-27 MED ORDER — CHLORHEXIDINE GLUCONATE 0.12 % MT SOLN
15.0000 mL | Freq: Once | OROMUCOSAL | Status: AC
  Administered 2022-10-27: 15 mL via OROMUCOSAL

## 2022-10-27 MED ORDER — PROPOFOL 10 MG/ML IV BOLUS
INTRAVENOUS | Status: DC | PRN
  Administered 2022-10-27: 200 mg via INTRAVENOUS

## 2022-10-27 MED ORDER — LIDOCAINE 2% (20 MG/ML) 5 ML SYRINGE
INTRAMUSCULAR | Status: DC | PRN
  Administered 2022-10-27: 80 mg via INTRAVENOUS

## 2022-10-27 MED ORDER — ROSUVASTATIN CALCIUM 10 MG PO TABS
10.0000 mg | ORAL_TABLET | Freq: Every day | ORAL | Status: DC
  Administered 2022-10-28 – 2022-10-31 (×4): 10 mg via ORAL
  Filled 2022-10-27 (×4): qty 1

## 2022-10-27 MED ORDER — SUGAMMADEX SODIUM 200 MG/2ML IV SOLN
INTRAVENOUS | Status: DC | PRN
  Administered 2022-10-27: 240 mg via INTRAVENOUS

## 2022-10-27 MED ORDER — BISACODYL 5 MG PO TBEC: 20.0000 mg | DELAYED_RELEASE_TABLET | Freq: Once | ORAL | Status: DC

## 2022-10-27 MED ORDER — FENTANYL CITRATE PF 50 MCG/ML IJ SOSY
PREFILLED_SYRINGE | INTRAMUSCULAR | Status: AC
  Administered 2022-10-27: 50 ug via INTRAVENOUS
  Filled 2022-10-27: qty 3

## 2022-10-27 MED ORDER — ENSURE SURGERY PO LIQD
237.0000 mL | Freq: Two times a day (BID) | ORAL | Status: DC
  Administered 2022-10-28 – 2022-10-29 (×3): 237 mL via ORAL

## 2022-10-27 MED ORDER — ABACAVIR-DOLUTEGRAVIR-LAMIVUD 600-50-300 MG PO TABS
1.0000 | ORAL_TABLET | Freq: Every day | ORAL | Status: DC
  Administered 2022-10-28 – 2022-10-31 (×4): 1 via ORAL
  Filled 2022-10-27 (×4): qty 1

## 2022-10-27 MED ORDER — MIDAZOLAM HCL 2 MG/2ML IJ SOLN
INTRAMUSCULAR | Status: DC | PRN
  Administered 2022-10-27: 2 mg via INTRAVENOUS

## 2022-10-27 MED ORDER — ROCURONIUM BROMIDE 10 MG/ML (PF) SYRINGE
PREFILLED_SYRINGE | INTRAVENOUS | Status: AC
  Filled 2022-10-27: qty 10

## 2022-10-27 MED ORDER — OXYCODONE HCL 5 MG PO TABS
ORAL_TABLET | ORAL | Status: AC
  Filled 2022-10-27: qty 1

## 2022-10-27 MED ORDER — SIMETHICONE 80 MG PO CHEW
40.0000 mg | CHEWABLE_TABLET | Freq: Four times a day (QID) | ORAL | Status: DC | PRN
  Administered 2022-10-28 – 2022-10-30 (×4): 40 mg via ORAL
  Filled 2022-10-27 (×5): qty 1

## 2022-10-27 MED ORDER — ACETAMINOPHEN 500 MG PO TABS
1000.0000 mg | ORAL_TABLET | Freq: Four times a day (QID) | ORAL | Status: DC
  Administered 2022-10-27 – 2022-10-31 (×14): 1000 mg via ORAL
  Filled 2022-10-27 (×16): qty 2

## 2022-10-27 MED ORDER — DIPHENHYDRAMINE HCL 50 MG/ML IJ SOLN: 12.5000 mg | Freq: Four times a day (QID) | INTRAMUSCULAR | Status: DC | PRN

## 2022-10-27 MED ORDER — DEXAMETHASONE SODIUM PHOSPHATE 10 MG/ML IJ SOLN
INTRAMUSCULAR | Status: AC
  Filled 2022-10-27: qty 1

## 2022-10-27 MED ORDER — ROCURONIUM BROMIDE 10 MG/ML (PF) SYRINGE
PREFILLED_SYRINGE | INTRAVENOUS | Status: DC | PRN
  Administered 2022-10-27: 60 mg via INTRAVENOUS
  Administered 2022-10-27: 10 mg via INTRAVENOUS

## 2022-10-27 MED ORDER — FENTANYL CITRATE (PF) 100 MCG/2ML IJ SOLN
INTRAMUSCULAR | Status: DC | PRN
  Administered 2022-10-27: 50 ug via INTRAVENOUS
  Administered 2022-10-27: 100 ug via INTRAVENOUS

## 2022-10-27 MED ORDER — LACTATED RINGERS IV SOLN: INTRAVENOUS | Status: DC

## 2022-10-27 MED ORDER — LIDOCAINE HCL (PF) 2 % IJ SOLN
INTRAMUSCULAR | Status: AC
  Filled 2022-10-27: qty 5

## 2022-10-27 MED ORDER — BUPIVACAINE-EPINEPHRINE 0.25% -1:200000 IJ SOLN
INTRAMUSCULAR | Status: AC
  Filled 2022-10-27: qty 1

## 2022-10-27 MED ORDER — ONDANSETRON HCL 4 MG/2ML IJ SOLN
4.0000 mg | Freq: Four times a day (QID) | INTRAMUSCULAR | Status: DC | PRN
  Administered 2022-10-28 – 2022-10-30 (×5): 4 mg via INTRAVENOUS
  Filled 2022-10-27 (×5): qty 2

## 2022-10-27 MED ORDER — OXYCODONE HCL 5 MG/5ML PO SOLN: 5.0000 mg | Freq: Once | ORAL | Status: AC | PRN

## 2022-10-27 MED ORDER — ACETAMINOPHEN 500 MG PO TABS
1000.0000 mg | ORAL_TABLET | ORAL | Status: AC
  Administered 2022-10-27: 1000 mg via ORAL
  Filled 2022-10-27: qty 2

## 2022-10-27 MED ORDER — GABAPENTIN 100 MG PO CAPS
300.0000 mg | ORAL_CAPSULE | Freq: Two times a day (BID) | ORAL | Status: DC
  Administered 2022-10-27 – 2022-10-31 (×8): 300 mg via ORAL
  Filled 2022-10-27 (×8): qty 3

## 2022-10-27 MED ORDER — DEXAMETHASONE SODIUM PHOSPHATE 10 MG/ML IJ SOLN
INTRAMUSCULAR | Status: DC | PRN
  Administered 2022-10-27: 4 mg via INTRAVENOUS

## 2022-10-27 MED ORDER — BUPIVACAINE LIPOSOME 1.3 % IJ SUSP
INTRAMUSCULAR | Status: AC
  Filled 2022-10-27: qty 20

## 2022-10-27 MED ORDER — ALUM & MAG HYDROXIDE-SIMETH 200-200-20 MG/5ML PO SUSP
30.0000 mL | Freq: Four times a day (QID) | ORAL | Status: DC | PRN
  Administered 2022-10-29: 30 mL via ORAL
  Filled 2022-10-27: qty 30

## 2022-10-27 MED ORDER — LISINOPRIL 20 MG PO TABS
20.0000 mg | ORAL_TABLET | Freq: Every day | ORAL | Status: DC
  Administered 2022-10-28 – 2022-10-31 (×4): 20 mg via ORAL
  Filled 2022-10-27 (×4): qty 1

## 2022-10-27 MED ORDER — SACCHAROMYCES BOULARDII 250 MG PO CAPS
250.0000 mg | ORAL_CAPSULE | Freq: Two times a day (BID) | ORAL | Status: DC
  Administered 2022-10-27 – 2022-10-31 (×8): 250 mg via ORAL
  Filled 2022-10-27 (×8): qty 1

## 2022-10-27 MED ORDER — FENTANYL CITRATE PF 50 MCG/ML IJ SOSY
25.0000 ug | PREFILLED_SYRINGE | INTRAMUSCULAR | Status: DC | PRN
  Administered 2022-10-27 (×2): 50 ug via INTRAVENOUS

## 2022-10-27 MED ORDER — OXYCODONE HCL 5 MG PO TABS
5.0000 mg | ORAL_TABLET | Freq: Once | ORAL | Status: AC | PRN
  Administered 2022-10-27: 5 mg via ORAL

## 2022-10-27 MED ORDER — SODIUM CHLORIDE 0.9 % IV SOLN
2.0000 g | INTRAVENOUS | Status: AC
  Administered 2022-10-27: 2 g via INTRAVENOUS
  Filled 2022-10-27: qty 2

## 2022-10-27 MED ORDER — PHENYLEPHRINE 80 MCG/ML (10ML) SYRINGE FOR IV PUSH (FOR BLOOD PRESSURE SUPPORT)
PREFILLED_SYRINGE | INTRAVENOUS | Status: AC
  Filled 2022-10-27: qty 10

## 2022-10-27 MED ORDER — MIDAZOLAM HCL 2 MG/2ML IJ SOLN
INTRAMUSCULAR | Status: AC
  Filled 2022-10-27: qty 2

## 2022-10-27 MED ORDER — ONDANSETRON HCL 4 MG/2ML IJ SOLN
INTRAMUSCULAR | Status: DC | PRN
Start: 2022-10-27 — End: 2022-10-27
  Administered 2022-10-27: 4 mg via INTRAVENOUS

## 2022-10-27 MED ORDER — PROPOFOL 10 MG/ML IV BOLUS
INTRAVENOUS | Status: AC
  Filled 2022-10-27: qty 20

## 2022-10-27 MED ORDER — ACETAMINOPHEN 325 MG PO TABS: 325.0000 mg | ORAL_TABLET | ORAL | Status: DC | PRN

## 2022-10-27 MED ORDER — ONDANSETRON HCL 4 MG/2ML IJ SOLN: 4.0000 mg | Freq: Once | INTRAMUSCULAR | Status: DC | PRN

## 2022-10-27 MED ORDER — BUPIVACAINE-EPINEPHRINE (PF) 0.25% -1:200000 IJ SOLN: INTRAMUSCULAR | Status: DC | PRN

## 2022-10-27 MED ORDER — HYDROMORPHONE HCL 1 MG/ML IJ SOLN
0.5000 mg | INTRAMUSCULAR | Status: DC | PRN
  Administered 2022-10-27 – 2022-10-28 (×2): 0.5 mg via INTRAVENOUS
  Filled 2022-10-27 (×3): qty 0.5

## 2022-10-27 MED ORDER — KCL IN DEXTROSE-NACL 20-5-0.45 MEQ/L-%-% IV SOLN
INTRAVENOUS | Status: DC
  Filled 2022-10-27: qty 1000

## 2022-10-27 MED ORDER — ONDANSETRON HCL 4 MG/2ML IJ SOLN
INTRAMUSCULAR | Status: AC
  Filled 2022-10-27: qty 2

## 2022-10-27 MED ORDER — ALVIMOPAN 12 MG PO CAPS
12.0000 mg | ORAL_CAPSULE | ORAL | Status: AC
  Administered 2022-10-27: 12 mg via ORAL
  Filled 2022-10-27: qty 1

## 2022-10-27 MED ORDER — BUPIVACAINE LIPOSOME 1.3 % IJ SUSP: 20.0000 mL | Freq: Once | INTRAMUSCULAR | Status: DC

## 2022-10-27 MED ORDER — MEPERIDINE HCL 50 MG/ML IJ SOLN: 6.2500 mg | INTRAMUSCULAR | Status: DC | PRN

## 2022-10-27 MED ORDER — POLYETHYLENE GLYCOL 3350 17 GM/SCOOP PO POWD: 1.0000 | Freq: Once | ORAL | Status: DC

## 2022-10-27 MED ORDER — ENOXAPARIN SODIUM 40 MG/0.4ML IJ SOSY
40.0000 mg | PREFILLED_SYRINGE | INTRAMUSCULAR | Status: DC
  Administered 2022-10-28 – 2022-10-31 (×4): 40 mg via SUBCUTANEOUS
  Filled 2022-10-27 (×4): qty 0.4

## 2022-10-27 MED ORDER — ACETAMINOPHEN 160 MG/5ML PO SOLN: 325.0000 mg | ORAL | Status: DC | PRN

## 2022-10-27 SURGICAL SUPPLY — 55 items
ADH SKN CLS APL DERMABOND .7 (GAUZE/BANDAGES/DRESSINGS) ×1
APL PRP STRL LF DISP 70% ISPRP (MISCELLANEOUS) ×1
BAG COUNTER SPONGE SURGICOUNT (BAG) IMPLANT
BAG SPNG CNTER NS LX DISP (BAG)
BLADE EXTENDED COATED 6.5IN (ELECTRODE) IMPLANT
CELLS DAT CNTRL 66122 CELL SVR (MISCELLANEOUS) ×1 IMPLANT
CHLORAPREP W/TINT 26 (MISCELLANEOUS) ×1 IMPLANT
COVER MAYO STAND STRL (DRAPES) ×3 IMPLANT
DERMABOND ADVANCED .7 DNX12 (GAUZE/BANDAGES/DRESSINGS) ×1 IMPLANT
DRAIN CHANNEL 19F RND (DRAIN) IMPLANT
DRAPE LAPAROSCOPIC ABDOMINAL (DRAPES) ×1 IMPLANT
DRSG OPSITE POSTOP 4X10 (GAUZE/BANDAGES/DRESSINGS) IMPLANT
DRSG OPSITE POSTOP 4X6 (GAUZE/BANDAGES/DRESSINGS) IMPLANT
DRSG OPSITE POSTOP 4X8 (GAUZE/BANDAGES/DRESSINGS) IMPLANT
ELECT REM PT RETURN 15FT ADLT (MISCELLANEOUS) ×1 IMPLANT
EVACUATOR SILICONE 100CC (DRAIN) IMPLANT
GAUZE SPONGE 4X4 12PLY STRL (GAUZE/BANDAGES/DRESSINGS) IMPLANT
GLOVE BIO SURGEON STRL SZ 6.5 (GLOVE) ×2 IMPLANT
GLOVE INDICATOR 6.5 STRL GRN (GLOVE) ×3 IMPLANT
GOWN STRL REUS W/ TWL XL LVL3 (GOWN DISPOSABLE) ×6 IMPLANT
GOWN STRL REUS W/TWL XL LVL3 (GOWN DISPOSABLE) ×6
KIT TURNOVER KIT A (KITS) IMPLANT
LEGGING LITHOTOMY PAIR STRL (DRAPES) IMPLANT
PACK COLON (CUSTOM PROCEDURE TRAY) ×1 IMPLANT
PAD POSITIONING PINK XL (MISCELLANEOUS) ×1 IMPLANT
PAD TELFA 2X3 NADH STRL (GAUZE/BANDAGES/DRESSINGS) IMPLANT
PENCIL SMOKE EVACUATOR (MISCELLANEOUS) IMPLANT
PROTECTOR NERVE ULNAR (MISCELLANEOUS) ×1 IMPLANT
RELOAD PROXIMATE 75MM BLUE (ENDOMECHANICALS) ×2 IMPLANT
RELOAD STAPLE 75 3.8 BLU REG (ENDOMECHANICALS) IMPLANT
RETRACTOR WND ALEXIS 18 MED (MISCELLANEOUS) IMPLANT
RTRCTR WOUND ALEXIS 18CM MED (MISCELLANEOUS) ×1
SEALER TISSUE G2 STRG ARTC 35C (ENDOMECHANICALS) IMPLANT
SPIKE FLUID TRANSFER (MISCELLANEOUS) ×1 IMPLANT
STAPLER 90 3.5 STAND SLIM (STAPLE) ×1
STAPLER 90 3.5 STD SLIM (STAPLE) IMPLANT
STAPLER PROXIMATE 75MM BLUE (STAPLE) IMPLANT
STAPLER VISISTAT 35W (STAPLE) ×1 IMPLANT
SURGILUBE 2OZ TUBE FLIPTOP (MISCELLANEOUS) ×1 IMPLANT
SUT ETHILON 2 0 PS N (SUTURE) ×2 IMPLANT
SUT NOVA NAB DX-16 0-1 5-0 T12 (SUTURE) ×2 IMPLANT
SUT PDS AB 1 TP1 96 (SUTURE) IMPLANT
SUT PROLENE 2 0 KS (SUTURE) ×1 IMPLANT
SUT SILK 2 0 (SUTURE)
SUT SILK 2 0 SH CR/8 (SUTURE) ×1 IMPLANT
SUT SILK 2-0 18XBRD TIE 12 (SUTURE) ×1 IMPLANT
SUT SILK 3 0 (SUTURE)
SUT SILK 3 0 SH CR/8 (SUTURE) ×1 IMPLANT
SUT SILK 3-0 18XBRD TIE 12 (SUTURE) ×1 IMPLANT
SUT VIC AB 2-0 SH 18 (SUTURE) ×1 IMPLANT
SUT VIC AB 4-0 PS2 27 (SUTURE) ×1 IMPLANT
SUT VICRYL 0 UR6 27IN ABS (SUTURE) IMPLANT
TOWEL OR NON WOVEN STRL DISP B (DISPOSABLE) ×1 IMPLANT
TRAY FOLEY MTR SLVR 16FR STAT (SET/KITS/TRAYS/PACK) IMPLANT
TUBING CONNECTING 10 (TUBING) ×1 IMPLANT

## 2022-10-27 NOTE — Anesthesia Procedure Notes (Signed)
Procedure Name: Intubation Date/Time: 10/27/2022 2:09 PM  Performed by: Nelle Don, CRNAPre-anesthesia Checklist: Patient identified, Emergency Drugs available, Suction available and Patient being monitored Patient Re-evaluated:Patient Re-evaluated prior to induction Oxygen Delivery Method: Circle system utilized Preoxygenation: Pre-oxygenation with 100% oxygen Induction Type: IV induction Ventilation: Mask ventilation without difficulty Laryngoscope Size: Mac and 4 Grade View: Grade I Tube type: Oral Tube size: 7.5 mm Number of attempts: 1 Airway Equipment and Method: Stylet Placement Confirmation: ETT inserted through vocal cords under direct vision, positive ETCO2 and breath sounds checked- equal and bilateral Secured at: 23 cm Tube secured with: Tape Dental Injury: Teeth and Oropharynx as per pre-operative assessment

## 2022-10-27 NOTE — Anesthesia Preprocedure Evaluation (Signed)
Anesthesia Evaluation  Patient identified by MRN, date of birth, ID band Patient awake    Reviewed: Allergy & Precautions, NPO status , Patient's Chart, lab work & pertinent test results  History of Anesthesia Complications Negative for: history of anesthetic complications  Airway Mallampati: I  TM Distance: >3 FB Neck ROM: Full    Dental  (+) Dental Advisory Given   Pulmonary pneumonia, former smoker, PE  S/p IVC filter placement yesterday    Pulmonary exam normal        Cardiovascular hypertension, Pt. on medications + DVT   Rhythm:Regular Rate:Tachycardia   '22 TTE - EF 50 to 55%. Mild left ventricular hypertrophy.  RV systolic function is moderately reduced. RV size is mildly enlarged. There is mildly elevated pulmonary  artery systolic pressure. The estimated right ventricular systolic pressure is 43.5 mmHg. Trivial MR. Severe PR.    Neuro/Psych  Headaches  Neuromuscular disease (postherpetic neuralgia)  negative psych ROS   GI/Hepatic negative GI ROS,,,(+)     substance abuse (sober x 4 mos)  alcohol use  Endo/Other   Obesity   Renal/GU Renal diseasenegative Renal ROS     Musculoskeletal  (+) Arthritis ,    Abdominal   Peds  Hematology  (+) Blood dyscrasia, anemia , HIV  Anesthesia Other Findings   Reproductive/Obstetrics                             Anesthesia Physical Anesthesia Plan  ASA: 3  Anesthesia Plan: General   Post-op Pain Management: Minimal or no pain anticipated, Toradol IV (intra-op)* and Ofirmev IV (intra-op)*   Induction: Intravenous  PONV Risk Score and Plan: 1 and Treatment may vary due to age or medical condition, Ondansetron and Dexamethasone  Airway Management Planned: Oral ETT  Additional Equipment: None  Intra-op Plan:   Post-operative Plan:   Informed Consent: I have reviewed the patients History and Physical, chart, labs and  discussed the procedure including the risks, benefits and alternatives for the proposed anesthesia with the patient or authorized representative who has indicated his/her understanding and acceptance.       Plan Discussed with: CRNA, Anesthesiologist and Surgeon  Anesthesia Plan Comments:         Anesthesia Quick Evaluation

## 2022-10-27 NOTE — Interval H&P Note (Signed)
History and Physical Interval Note:  10/27/2022 12:48 PM  Leon Nichols  has presented today for surgery, with the diagnosis of COLOSTOMY IN PLACE.  The various methods of treatment have been discussed with the patient and family. After consideration of risks, benefits and other options for treatment, the patient has consented to  Procedure(s): OPEN CLOSURE OF LOOP COLOSTOMY (N/A) as a surgical intervention.  The patient's history has been reviewed, patient examined, no change in status, stable for surgery.  I have reviewed the patient's chart and labs.  Questions were answered to the patient's satisfaction.     Vanita Panda, MD  Colorectal and General Surgery [facility]

## 2022-10-27 NOTE — Transfer of Care (Signed)
Immediate Anesthesia Transfer of Care Note  Patient: Leon Nichols  Procedure(s) Performed: OPEN CLOSURE OF LOOP COLOSTOMY  Patient Location: PACU  Anesthesia Type:General  Level of Consciousness: awake, alert , and oriented  Airway & Oxygen Therapy: Patient Spontanous Breathing and Patient connected to face mask oxygen  Post-op Assessment: Report given to RN, Post -op Vital signs reviewed and stable, and Patient moving all extremities X 4  Post vital signs: Reviewed and stable  Last Vitals:  Vitals Value Taken Time  BP 149/99   Temp    Pulse 80 10/27/22 1603  Resp 14 10/27/22 1603  SpO2 99 % 10/27/22 1603  Vitals shown include unfiled device data.  Last Pain:  Vitals:   10/27/22 1144  TempSrc: Oral  PainSc:          Complications: No notable events documented.

## 2022-10-27 NOTE — Op Note (Signed)
10/27/2022  4:03 PM  PATIENT:  Leon Nichols  57 y.o. male  Patient Care Team: Pa, [facility] as PCP - General (Internal Medicine) [name], MD as PCP - Infectious Diseases (Infectious Diseases) Orbie Pyo, MD as PCP - Cardiology (Cardiology)  PRE-OPERATIVE DIAGNOSIS:  COLOSTOMY IN PLACE  POST-OPERATIVE DIAGNOSIS:  COLOSTOMY IN PLACE  PROCEDURE:   OPEN CLOSURE OF LOOP COLOSTOMY  SURGEON:  Surgeon(s): Romie Levee, MD Karie Soda, MD  ASSISTANT: Dr Michaell Cowing   ANESTHESIA:   local and general  EBL:100ml  Total I/O In: 1100 [I.V.:1000; IV Piggyback:100] Out: -   DRAINS: none   SPECIMEN:  Source of Specimen:  colostomy  DISPOSITION OF SPECIMEN:  PATHOLOGY  COUNTS:  YES  PLAN OF CARE: Admit to inpatient   PATIENT DISPOSITION:  PACU - hemodynamically stable.  INDICATION: Male who presented to the office with a new colostomy created for severe anorectal disease.  This has since resolved and he is here today for reversal.   OR FINDINGS: Colostomy in place in the left lower quadrant without herniation.  Granulation tissue noted around the edges of the mucocutaneous junction  DESCRIPTION: the patient was identified in the preoperative holding area and taken to the OR where they were laid supine on the operating room table.  General anesthesia was induced without difficulty. SCDs were also noted to be in place prior to the initiation of anesthesia.  The patient was then prepped and draped in the usual sterile fashion.   A surgical timeout was performed indicating the correct patient, procedure, positioning and need for preoperative antibiotics.   On the mucocutaneous junction using bovie electrocautery.  this was carried down through the subcutaneous tissues using cautery as well.  I bluntly dissected at the level of the fascia and entered into the peritoneum.  I carefully dissected circumferentially around the ostomy at the level of the fascia using Metzenbaum  scissors.  Hemostasis was achieved as we went.  Once the entire colostomy was free, I identified the proximal and distal limbs.  Colostomy was transected on either side using a blue load GIA 75 mm stapler.  A side-to-side functional end-to-end anastomosis was performed also using a GIA blue load 75 mm stapler.  90 TA stapler was used to close the common enterotomy.  The staple line was oversewn using interrupted 3-0 silk suture.  I attempted to put a stitch in the crotch of the anastomosis but I will screen for 2 weeks to follow.  I decided to place him back into the abdomen as is.  The posterior fascia was closed using a running 0 Vicryl suture.  The anterior fascia was then closed using 2, all running #1 Novafil sutures.  4 sutures.  Subcutaneous tissue was reapproximated using 2, 2-0 Vicryl pursestring sutures.  A Telfa wick was placed in the middle of the pursestring and a sterile dressing was applied.  The patient awakened from anesthesia and was actually postanesthesia care unit in stable condition. All counts were correct properly improved/reviewed  Vanita Panda, MD  Colorectal and General Surgery [facility]

## 2022-10-28 LAB — CBC
HCT: 42.5 % (ref 39.0–52.0)
Hemoglobin: 14.2 g/dL (ref 13.0–17.0)
MCH: 31.2 pg (ref 26.0–34.0)
MCV: 93.4 fL (ref 80.0–100.0)
Platelets: 229 10*3/uL (ref 150–400)
RBC: 4.55 MIL/uL (ref 4.22–5.81)
RDW: 14.6 % (ref 11.5–15.5)
WBC: 13.7 10*3/uL — ABNORMAL HIGH (ref 4.0–10.5)
nRBC: 0 % (ref 0.0–0.2)

## 2022-10-28 LAB — BASIC METABOLIC PANEL
Anion gap: 11 (ref 5–15)
BUN: 14 mg/dL (ref 6–20)
CO2: 22 mmol/L (ref 22–32)
Calcium: 8.6 mg/dL — ABNORMAL LOW (ref 8.9–10.3)
Chloride: 99 mmol/L (ref 98–111)
Creatinine, Ser: 0.92 mg/dL (ref 0.61–1.24)
GFR, Estimated: >60 mL/min (ref >60)
Glucose, Bld: 158 mg/dL — ABNORMAL HIGH (ref 70–99)
Potassium: 4.2 mmol/L (ref 3.5–5.1)
Sodium: 132 mmol/L — ABNORMAL LOW (ref 135–145)

## 2022-10-28 NOTE — Anesthesia Postprocedure Evaluation (Signed)
Anesthesia Post Note  Patient: Leon Nichols  Procedure(s) Performed: OPEN CLOSURE OF LOOP COLOSTOMY     Patient location during evaluation: PACU Anesthesia Type: General Level of consciousness: awake and alert Pain management: pain level controlled Vital Signs Assessment: post-procedure vital signs reviewed and stable Respiratory status: spontaneous breathing, nonlabored ventilation, respiratory function stable and patient connected to nasal cannula oxygen Cardiovascular status: blood pressure returned to baseline and stable Postop Assessment: no apparent nausea or vomiting Anesthetic complications: no   No notable events documented.  Last Vitals:  Vitals:   10/28/22 0852 10/28/22 1516  BP: 115/86 130/88  Pulse: 74 74  Resp: 17   Temp: 36.4 C 36.5 C  SpO2: 95% 98%    Last Pain:  Vitals:   10/28/22 2021  TempSrc:   PainSc: 0-No pain                 Iridian Reader

## 2022-10-28 NOTE — Plan of Care (Signed)
  Problem: Activity: Goal: Ability to tolerate increased activity will improve Outcome: Progressing   Problem: Bowel/Gastric: Goal: Gastrointestinal status for postoperative course will improve Outcome: Progressing   Problem: Skin Integrity: Goal: Will show signs of wound healing Outcome: Progressing   Problem: Health Behavior/Discharge Planning: Goal: Ability to manage health-related needs will improve Outcome: Progressing   Problem: Clinical Measurements: Goal: Will remain free from infection Outcome: Progressing

## 2022-10-28 NOTE — Progress Notes (Signed)
1 Day Post-Op colostomy reversal Subjective: No acute issues overnight.  No nausea, no flatus, no BM yet  Objective: Vital signs in last 24 hours: Temp:  [97.7 F (36.5 C)-98.4 F (36.9 C)] 97.9 F (36.6 C) (07/25 0543) Pulse Rate:  [75-92] 77 (07/25 0543) Resp:  [13-19] 16 (07/25 0543) BP: (124-144)/(88-108) 124/89 (07/25 0543) SpO2:  [95 %-100 %] 95 % (07/25 0543) Weight:  [119.7 kg] 119.7 kg (07/24 1141)   Intake/Output from previous day: 07/24 0701 - 07/25 0700 In: 1963.2 [P.O.:280; I.V.:1583.2; IV Piggyback:100] Out: 710 [Urine:700; Blood:10] Intake/Output this shift: No intake/output data recorded.   General appearance: alert and cooperative GI: normal findings: soft, non-tender  Incision: no significant drainage  Lab Results:  Recent Labs    10/28/22 0426  WBC 13.7*  HGB 14.2  HCT 42.5  PLT 229   BMET Recent Labs    10/28/22 0426  NA 132*  K 4.2  CL 99  CO2 22  GLUCOSE 158*  BUN 14  CREATININE 0.92  CALCIUM 8.6*   PT/INR No results for input(s): "LABPROT", "INR" in the last 72 hours. ABG No results for input(s): "PHART", "HCO3" in the last 72 hours.  Invalid input(s): "PCO2", "PO2"  MEDS, Scheduled  abacavir-dolutegravir-lamiVUDine  1 tablet Oral Daily   acetaminophen  1,000 mg Oral Q6H   alvimopan  12 mg Oral BID   amLODipine  10 mg Oral Daily   enoxaparin (LOVENOX) injection  40 mg Subcutaneous Q24H   feeding supplement  237 mL Oral BID BM   gabapentin  300 mg Oral BID   lisinopril  20 mg Oral Daily   rosuvastatin  10 mg Oral Daily   saccharomyces boulardii  250 mg Oral BID    Studies/Results: No results found.  Assessment: s/p Procedure(s): OPEN CLOSURE OF LOOP COLOSTOMY Patient Active Problem List   Diagnosis Date Noted   Colostomy in place [facility]) 10/27/2022   Gross hematuria    Symptomatic anemia    Hypertension    Community acquired pneumonia    Acute pulmonary embolism (HCC) 02/26/2021   Acute DVT (deep venous  thrombosis) (HCC) 02/26/2021   Occult blood in stools 02/26/2021   Pyuria, sterile 02/26/2021   Acute pulmonary embolism without acute cor pulmonale (HCC) 02/26/2021   Alcohol dependency (HCC) 02/20/2019   Chronic otitis externa of left ear 06/30/2018   Headache 10/15/2015   Altered bowel elimination due to intestinal ostomy (HCC) 02/04/2014   Acute kidney injury (HCC) 02/02/2014   Neuralgia, post-herpetic 01/02/2014   HIV disease (HCC) 03/24/2012   Anemia 03/23/2012   Essential hypertension 03/29/2007    Expected post op course  Plan: Advance diet SL IVF's Ambulate in hall PO pain control   LOS: 1 day     .Vanita Panda, MD [facility], [place]    10/28/2022 7:45 AM

## 2022-10-28 NOTE — Progress Notes (Signed)
Transition of Care [facility]) - Inpatient Brief Assessment   Patient Details  Name: Leon Nichols MRN: [id number] Date of Birth: [birth year]  Transition of Care [facility]) CM/SW Contact:    Coralyn Helling, LCSW Phone Number: 10/28/2022, 10:31 AM   Clinical Narrative: Screened no, needs anticipated.    Transition of Care Asessment: Insurance and Status: Insurance coverage has been reviewed Patient has primary care physician: Yes Home environment has been reviewed: Home alone with family support Prior level of function:: independent Prior/Current Home Services: No current home services Social Determinants of Health Reivew: SDOH reviewed no interventions necessary Readmission risk has been reviewed: No (green) Transition of care needs: no transition of care needs at this time

## 2022-10-29 ENCOUNTER — Encounter (HOSPITAL_COMMUNITY): Payer: Self-pay | Admitting: General Surgery

## 2022-10-29 NOTE — Progress Notes (Signed)
2 Days Post-Op colostomy reversal Subjective: No acute issues overnight.  No nausea, having BM's  Objective: Vital signs in last 24 hours: Temp:  [97.7 F (36.5 C)-97.9 F (36.6 C)] 97.7 F (36.5 C) (07/26 0529) Pulse Rate:  [74-81] 81 (07/26 0529) Resp:  [18] 18 (07/26 0529) BP: (130-147)/(88-102) 147/102 (07/26 0529) SpO2:  [95 %-98 %] 95 % (07/26 0529) Weight:  [125.3 kg] 125.3 kg (07/26 0529)   Intake/Output from previous day: 07/25 0701 - 07/26 0700 In: 1772.5 [P.O.:1440; I.V.:332.5] Out: 300 [Urine:300] Intake/Output this shift: No intake/output data recorded.   General appearance: alert and cooperative GI: normal findings: soft, non-tender  Incision: no significant drainage, wick removed  Lab Results:  Recent Labs    10/28/22 0426 10/29/22 0424  WBC 13.7* 8.6  HGB 14.2 13.6  HCT 42.5 41.5  PLT 229 223   BMET Recent Labs    10/28/22 0426 10/29/22 0424  NA 132* 134*  K 4.2 3.8  CL 99 99  CO2 22 25  GLUCOSE 158* 135*  BUN 14 23*  CREATININE 0.92 0.95  CALCIUM 8.6* 9.2   PT/INR No results for input(s): "LABPROT", "INR" in the last 72 hours. ABG No results for input(s): "PHART", "HCO3" in the last 72 hours.  Invalid input(s): "PCO2", "PO2"  MEDS, Scheduled  abacavir-dolutegravir-lamiVUDine  1 tablet Oral Daily   acetaminophen  1,000 mg Oral Q6H   alvimopan  12 mg Oral BID   amLODipine  10 mg Oral Daily   enoxaparin (LOVENOX) injection  40 mg Subcutaneous Q24H   feeding supplement  237 mL Oral BID BM   gabapentin  300 mg Oral BID   lisinopril  20 mg Oral Daily   rosuvastatin  10 mg Oral Daily   saccharomyces boulardii  250 mg Oral BID    Studies/Results: No results found.  Assessment: s/p Procedure(s): OPEN CLOSURE OF LOOP COLOSTOMY Patient Active Problem List   Diagnosis Date Noted   Colostomy in place [facility]) 10/27/2022   Gross hematuria    Symptomatic anemia    Hypertension    Community acquired pneumonia    Acute pulmonary  embolism (HCC) 02/26/2021   Acute DVT (deep venous thrombosis) (HCC) 02/26/2021   Occult blood in stools 02/26/2021   Pyuria, sterile 02/26/2021   Acute pulmonary embolism without acute cor pulmonale (HCC) 02/26/2021   Alcohol dependency (HCC) 02/20/2019   Chronic otitis externa of left ear 06/30/2018   Headache 10/15/2015   Altered bowel elimination due to intestinal ostomy (HCC) 02/04/2014   Acute kidney injury (HCC) 02/02/2014   Neuralgia, post-herpetic 01/02/2014   HIV disease (HCC) 03/24/2012   Anemia 03/23/2012   Essential hypertension 03/29/2007    Expected post op course  Plan: Cont diet Ambulate in hall PO pain control Probable D/C tom   LOS: 2 days     .Leon Panda, MD [facility] Surgery, [place]    10/29/2022 9:10 AM

## 2022-10-29 NOTE — Progress Notes (Signed)
Mobility Specialist - Progress Note   10/29/22 1523  Mobility  Activity Ambulated independently in hallway  Level of Assistance Independent  Assistive Device None  Distance Ambulated (ft) 1000 ft  Range of Motion/Exercises Active  Activity Response Tolerated well  Mobility Referral Yes  $Mobility charge 1 Mobility  Mobility Specialist Start Time (ACUTE ONLY) 1510  Mobility Specialist Stop Time (ACUTE ONLY) 1523  Mobility Specialist Time Calculation (min) (ACUTE ONLY) 13 min   Pt was found on recliner chair and agreeable to ambulate. Stated having some abdominal pain with session but got better by the EOS. At EOS returned to recliner chair with all needs met.  Billey Chang Mobility Specialist

## 2022-10-29 NOTE — Discharge Instructions (Signed)
ABDOMINAL SURGERY: POST OP INSTRUCTIONS  DIET: Follow a light bland diet the first 24 hours after arrival home, such as soup, liquids, crackers, etc.  Be sure to include lots of fluids daily.  Avoid fast food or heavy meals as your are more likely to get nauseated.  Do not eat any uncooked fruits or vegetables for the next 2 weeks as your colon heals. Take your usually prescribed home medications unless otherwise directed. PAIN CONTROL: Pain is best controlled by a usual combination of three different methods TOGETHER: Ice/Heat Over the counter pain medication Prescription pain medication Most patients will experience some swelling and bruising around the incisions.  Ice packs or heating pads (30-60 minutes up to 6 times a day) will help. Use ice for the first few days to help decrease swelling and bruising, then switch to heat to help relax tight/sore spots and speed recovery.  Some people prefer to use ice alone, heat alone, alternating between ice & heat.  Experiment to what works for you.  Swelling and bruising can take several weeks to resolve.   It is helpful to take an over-the-counter pain medication regularly for the first few weeks.  Choose one of the following that works best for you: Naproxen (Aleve, etc)  Two 220mg  tabs twice a day Ibuprofen (Advil, etc) Three 200mg  tabs four times a day (every meal & bedtime) Acetaminophen (Tylenol, etc) 500-650mg  four times a day (every meal & bedtime) A  prescription for pain medication (such as oxycodone, hydrocodone, etc) should be given to you upon discharge.  Take your pain medication as prescribed.  If you are having problems/concerns with the prescription medicine (does not control pain, nausea, vomiting, rash, itching, etc), please call us [phone] to see if we need to switch you to a different pain medicine that will work better for you and/or control your side effect better. If you need a refill on your pain medication, please contact  your pharmacy.  They will contact our office to request authorization. Prescriptions will not be filled after 5 pm or on week-ends. Avoid getting constipated.  Between the surgery and the pain medications, it is common to experience some constipation.  Increasing fluid intake and taking a fiber supplement (such as Metamucil, Citrucel, FiberCon, MiraLax, etc) 1-2 times a day regularly will usually help prevent this problem from occurring.  A mild laxative (prune juice, Milk of Magnesia, MiraLax, etc) should be taken according to package directions if there are no bowel movements after 48 hours.   Watch out for diarrhea.  If you have many loose bowel movements, simplify your diet to bland foods & liquids for a few days.  Stop any stool softeners and decrease your fiber supplement.  Switching to mild anti-diarrheal medications (Kayopectate, Pepto Bismol) can help.  If this worsens or does not improve, please call us. Wash / shower every day.  You may shower over the incision / wound.  Avoid baths until the skin is fully healed.  Cover with a dry dressing afterwards ACTIVITIES as tolerated:   You may resume regular (light) daily activities beginning the next day--such as daily self-care, walking, climbing stairs--gradually increasing activities as tolerated.  If you can walk 30 minutes without difficulty, it is safe to try more intense activity such as jogging, treadmill, bicycling, low-impact aerobics, swimming, etc. Save the most intensive and strenuous activity for last such as sit-ups, heavy lifting, contact sports, etc  Refrain from any heavy lifting or straining until you are off narcotics  for pain control.   DO NOT PUSH THROUGH PAIN.  Let pain be your guide: If it hurts to do something, don't do it.  Pain is your body warning you to avoid that activity for another week until the pain goes down. You may drive when you are no longer taking prescription pain medication, you can comfortably wear a seatbelt,  and you can safely maneuver your car and apply brakes. You may have sexual intercourse when it is comfortable.  FOLLOW UP in our office Please call [facility] at [phone] to set up an appointment to see your surgeon in the office for a follow-up appointment approximately 1-2 weeks after your surgery. Make sure that you call for this appointment the day you arrive home to insure a convenient appointment time. 10. IF YOU HAVE DISABILITY OR FAMILY LEAVE FORMS, BRING THEM TO THE OFFICE FOR PROCESSING.  DO NOT GIVE THEM TO YOUR DOCTOR.   WHEN TO CALL us [phone]: Poor pain control Reactions / problems with new medications (rash/itching, nausea, etc)  Fever over 101.5 F (38.5 C) Inability to urinate Nausea and/or vomiting Worsening swelling or bruising Continued bleeding from incision. Increased pain, redness, or drainage from the incision  The clinic staff is available to answer your questions during regular business hours (8:30am-5pm).  Please don't hesitate to call and ask to speak to one of our nurses for clinical concerns.   A surgeon from [facility] is always on call at the hospitals   If you have a medical emergency, go to the nearest emergency room or call 911.    [facility] [address], [address], [place], [place]  [address] ? MAIN: [phone] ? TOLL FREE: [phone] ? FAX [phone] [email or web address]

## 2022-10-29 NOTE — Plan of Care (Signed)
  Problem: Education: Goal: Understanding of discharge needs will improve Outcome: Progressing   Problem: Activity: Goal: Ability to tolerate increased activity will improve Outcome: Progressing   Problem: Bowel/Gastric: Goal: Gastrointestinal status for postoperative course will improve Outcome: Progressing   Problem: Nutritional: Goal: Will attain and maintain optimal nutritional status will improve Outcome: Progressing   Problem: Clinical Measurements: Goal: Postoperative complications will be avoided or minimized Outcome: Progressing

## 2022-10-30 NOTE — Progress Notes (Signed)
    3 Days Post-Op  Subjective: Bloating and nausea issues. He is still having bowel movements  ROS: See above, otherwise other systems negative  Objective: Vital signs in last 24 hours: Temp:  [98 F (36.7 C)] 98 F (36.7 C) (07/27 0639) Pulse Rate:  [83-86] 85 (07/27 0639) Resp:  [18] 18 (07/27 0639) BP: (134-154)/(92-108) 154/107 (07/27 0639) SpO2:  [92 %-96 %] 92 % (07/27 0639) Last BM Date : 10/29/22  Intake/Output from previous day: 07/26 0701 - 07/27 0700 In: 1920 [P.O.:1920] Out: -  Intake/Output this shift: No intake/output data recorded.  PE: Gen: NAd Resp: nonlabored CV: RRR Abd: moderate distended,nontender, incisions c/d/i  Lab Results:  Recent Labs    10/28/22 0426 10/29/22 0424  WBC 13.7* 8.6  HGB 14.2 13.6  HCT 42.5 41.5  PLT 229 223   BMET Recent Labs    10/28/22 0426 10/29/22 0424  NA 132* 134*  K 4.2 3.8  CL 99 99  CO2 22 25  GLUCOSE 158* 135*  BUN 14 23*  CREATININE 0.92 0.95  CALCIUM 8.6* 9.2   PT/INR No results for input(s): "LABPROT", "INR" in the last 72 hours. CMP     Component Value Date/Time   NA 134 (L) 10/29/2022 0424   NA 141 07/21/2016 1005   K 3.8 10/29/2022 0424   CL 99 10/29/2022 0424   CO2 25 10/29/2022 0424   GLUCOSE 135 (H) 10/29/2022 0424   BUN 23 (H) 10/29/2022 0424   BUN 10 07/21/2016 1005   CREATININE 0.95 10/29/2022 0424   CREATININE 1.03 05/24/2022 0900   CALCIUM 9.2 10/29/2022 0424   PROT 7.4 05/24/2022 0900   ALBUMIN 2.3 (L) 02/28/2021 0259   AST 19 05/24/2022 0900   ALT 14 05/24/2022 0900   ALKPHOS 98 02/28/2021 0259   BILITOT 0.4 05/24/2022 0900   GFRNONAA >60 10/29/2022 0424   GFRNONAA 104 05/08/2020 1642   GFRAA 120 05/08/2020 1642   Lipase     Component Value Date/Time   LIPASE 32 10/26/2017 2351    Studies/Results: No results found.  Anti-infectives: Anti-infectives (From admission, onward)    Start     Dose/Rate Route Frequency Ordered Stop   10/28/22 1000   abacavir-dolutegravir-lamiVUDine (TRIUMEQ) 600-50-300 MG per tablet 1 tablet        1 tablet Oral Daily 10/27/22 1800     10/27/22 1130  cefoTEtan (CEFOTAN) 2 g in sodium chloride 0.9 % 100 mL IVPB        2 g 200 mL/hr over 30 Minutes Intravenous On call to O.R. 10/27/22 1125 10/28/22 0700       Assessment/Plan  Colostomy reversal  FEN - continue reg diet VTE - lovenox ID - no issues Dispo - inpatient for persistent nausea and distension, continue entereg  I reviewed last 24 h vitals and pain scores, last 48 h intake and output, last 24 h labs and trends, and last 24 h imaging results.  This care required moderate level of medical decision making.    LOS: 3 days   [facility] 10/30/2022, 9:15 AM Please see Amion for pager number during day hours 7:00am-4:30pm or 7:00am -11:30am on weekends

## 2022-10-30 NOTE — Plan of Care (Signed)
  Problem: Education: Goal: Understanding of discharge needs will improve Outcome: Progressing   Problem: Activity: Goal: Ability to tolerate increased activity will improve Outcome: Progressing   Problem: Bowel/Gastric: Goal: Gastrointestinal status for postoperative course will improve Outcome: Progressing   Problem: Nutritional: Goal: Will attain and maintain optimal nutritional status will improve Outcome: Progressing   Problem: Skin Integrity: Goal: Will show signs of wound healing Outcome: Progressing   Problem: Education: Goal: Knowledge of General Education information will improve Description: Including pain rating scale, medication(s)/side effects and non-pharmacologic comfort measures Outcome: Progressing

## 2022-10-31 MED ORDER — IBUPROFEN 800 MG PO TABS: 800.0000 mg | ORAL_TABLET | Freq: Three times a day (TID) | ORAL | 0 refills | Status: DC | PRN

## 2022-10-31 MED ORDER — OXYCODONE HCL 5 MG PO TABS: 5.0000 mg | ORAL_TABLET | Freq: Four times a day (QID) | ORAL | 0 refills | Status: DC | PRN

## 2022-10-31 MED ORDER — ONDANSETRON 4 MG PO TBDP: 4.0000 mg | ORAL_TABLET | Freq: Four times a day (QID) | ORAL | 0 refills | Status: DC | PRN

## 2022-10-31 NOTE — Progress Notes (Signed)
Patient was given discharge instructions, and all questions were answered. Patient was stable for discharge and was walked to the main exit. 

## 2022-10-31 NOTE — Discharge Summary (Signed)
Physician Discharge Summary  Leon Nichols:[id number] DOB: 02/15/1966 DOA: 10/27/2022  PCP: [facility]  Admit date: 10/27/2022 Discharge date:  10/31/2022   Recommendations for Outpatient Follow-up:   (include homehealth, outpatient follow-up instructions, specific recommendations for PCP to follow-up on, etc.)   Follow-up Information     Romie Levee, MD. Schedule an appointment as soon as possible for a visit in 2 week(s).   Specialties: General Surgery, Colon and Rectal Surgery Contact information: [address] [place] [address] [phone]                Discharge Diagnoses:  Principal Problem:   Colostomy in place [facility])   Surgical Procedure: robotic colostomy reversal  Discharge Condition: Good Disposition: Home  Diet recommendation: reg diet   Hospital Course:  57 yo male underwent colostomy reversal. Post op he was admitted to the surgical floor. He had some abdominal distension, nausea issues and gas pains for the first 3 days. This improved and he was discharged home POD 4.  Discharge Instructions  Discharge Instructions     Call MD for:  difficulty breathing, headache or visual disturbances   Complete by: As directed    Call MD for:  persistant nausea and vomiting   Complete by: As directed    Call MD for:  redness, tenderness, or signs of infection (pain, swelling, redness, odor or green/yellow discharge around incision site)   Complete by: As directed    Call MD for:  severe uncontrolled pain   Complete by: As directed    Call MD for:  temperature >100.4   Complete by: As directed    Diet - low sodium heart healthy   Complete by: As directed    Discharge wound care:   Complete by: As directed    Shower normal tomorrow. Glue to stay on for 10-14 days. No bandage needed.   Increase activity slowly   Complete by: As directed       Allergies as of 10/31/2022   No Known Allergies      Medication List      TAKE these medications    amLODipine 10 MG tablet Commonly known as: NORVASC TAKE 1 TABLET(10 MG) BY MOUTH DAILY   bisacodyl 5 MG EC tablet Commonly known as: DULCOLAX Take 5 mg by mouth daily as needed.   ibuprofen 800 MG tablet Commonly known as: ADVIL Take 1 tablet (800 mg total) by mouth every 8 (eight) hours as needed.   lisinopril 20 MG tablet Commonly known as: ZESTRIL Take 1 tablet (20 mg total) by mouth daily.   ondansetron 4 MG disintegrating tablet Commonly known as: ZOFRAN-ODT Take 1 tablet (4 mg total) by mouth every 6 (six) hours as needed for up to 20 doses for nausea or vomiting (Place under tongue).   oxyCODONE 5 MG immediate release tablet Commonly known as: Oxy IR/ROXICODONE Take 1 tablet (5 mg total) by mouth every 6 (six) hours as needed for severe pain.   pantoprazole 40 MG tablet Commonly known as: PROTONIX Take 1 tablet (40 mg total) by mouth daily.   polyethylene glycol powder 17 GM/SCOOP powder Commonly known as: GLYCOLAX/MIRALAX Take 17 g by mouth once.   rosuvastatin 10 MG tablet Commonly known as: Crestor Take 1 tablet (10 mg total) by mouth daily.   Triumeq 600-50-300 MG tablet Generic drug: abacavir-dolutegravir-lamiVUDine Take 1 tablet by mouth daily.               Discharge Care  Instructions  (From admission, onward)           Start     Ordered   10/31/22 0000  Discharge wound care:       Comments: Shower normal tomorrow. Glue to stay on for 10-14 days. No bandage needed.   10/31/22 4782            Follow-up Information     Romie Levee, MD. Schedule an appointment as soon as possible for a visit in 2 week(s).   Specialties: General Surgery, Colon and Rectal Surgery Contact information: [address] [place] [address] [phone]                  The results of significant diagnostics from this hospitalization (including imaging, microbiology, ancillary and laboratory) are  listed below for reference.    Significant Diagnostic Studies: No results found.  Labs: Basic Metabolic Panel: Recent Labs  Lab 10/28/22 0426 10/29/22 0424  NA 132* 134*  K 4.2 3.8  CL 99 99  CO2 22 25  GLUCOSE 158* 135*  BUN 14 23*  CREATININE 0.92 0.95  CALCIUM 8.6* 9.2   Liver Function Tests: No results for input(s): "AST", "ALT", "ALKPHOS", "BILITOT", "PROT", "ALBUMIN" in the last 168 hours.  CBC: Recent Labs  Lab 10/28/22 0426 10/29/22 0424  WBC 13.7* 8.6  HGB 14.2 13.6  HCT 42.5 41.5  MCV 93.4 93.9  PLT 229 223    CBG: No results for input(s): "GLUCAP" in the last 168 hours.  Principal Problem:   Colostomy in place [facility])   Time coordinating discharge: 15 min

## 2023-01-09 ENCOUNTER — Other Ambulatory Visit: Payer: Self-pay | Admitting: Internal Medicine

## 2023-01-14 ENCOUNTER — Other Ambulatory Visit: Payer: Self-pay | Admitting: Internal Medicine

## 2023-01-17 ENCOUNTER — Other Ambulatory Visit: Payer: Self-pay

## 2023-01-17 DIAGNOSIS — B20 Human immunodeficiency virus [HIV] disease: Secondary | ICD-10-CM

## 2023-01-17 MED ORDER — TRIUMEQ 600-50-300 MG PO TABS
1.0000 | ORAL_TABLET | Freq: Every day | ORAL | 0 refills | Status: DC
Start: 2023-01-17 — End: 2023-02-14

## 2023-01-26 ENCOUNTER — Other Ambulatory Visit (HOSPITAL_COMMUNITY)
Admission: RE | Admit: 2023-01-26 | Discharge: 2023-01-26 | Disposition: A | Discharge status: Home / Self Care | Payer: 59 | Source: Ambulatory Visit | Attending: Internal Medicine | Admitting: Internal Medicine

## 2023-01-26 ENCOUNTER — Other Ambulatory Visit: Payer: Self-pay | Admitting: Internal Medicine

## 2023-01-26 ENCOUNTER — Ambulatory Visit (INDEPENDENT_AMBULATORY_CARE_PROVIDER_SITE_OTHER): Payer: 59 | Admitting: Internal Medicine

## 2023-01-26 ENCOUNTER — Other Ambulatory Visit: Payer: Self-pay

## 2023-01-26 ENCOUNTER — Encounter: Payer: Self-pay | Admitting: Internal Medicine

## 2023-01-26 VITALS — BP 142/95 | HR 63 | Temp 97.7°F | Ht 73.0 in | Wt 255.0 lb

## 2023-01-26 DIAGNOSIS — B20 Human immunodeficiency virus [HIV] disease: Secondary | ICD-10-CM | POA: Diagnosis not present

## 2023-01-26 DIAGNOSIS — Z23 Encounter for immunization: Secondary | ICD-10-CM | POA: Diagnosis not present

## 2023-01-26 DIAGNOSIS — R351 Nocturia: Secondary | ICD-10-CM

## 2023-01-26 MED ORDER — TAMSULOSIN HCL 0.4 MG PO CAPS: 0.4000 mg | ORAL_CAPSULE | Freq: Every day | ORAL | 0 refills | Status: DC

## 2023-01-26 NOTE — Progress Notes (Signed)
Patient ID: Leon Nichols, male   DOB: 07/17/1965, 57 y.o.   MRN: [id number]  HPI Leon Nichols is a 57yo M with well controlled hiv disease on triumeq, also has htn.  In the last 2 wks started to have frequent, non painful urination, mostly in the evenings  Also has increase abdominal distension but has 1-2 BM per day; has not returned back to see general surgery  Outpatient Encounter Medications as of 01/26/2023  Medication Sig   abacavir-dolutegravir-lamiVUDine (TRIUMEQ) 600-50-300 MG tablet Take 1 tablet by mouth daily.   amLODipine (NORVASC) 10 MG tablet TAKE 1 TABLET(10 MG) BY MOUTH DAILY   ibuprofen (ADVIL) 800 MG tablet Take 1 tablet (800 mg total) by mouth every 8 (eight) hours as needed.   lisinopril (ZESTRIL) 20 MG tablet Take 1 tablet (20 mg total) by mouth daily.   rosuvastatin (CRESTOR) 10 MG tablet Take 1 tablet (10 mg total) by mouth daily.   bisacodyl (DULCOLAX) 5 MG EC tablet Take 5 mg by mouth daily as needed. (Patient not taking: Reported on 01/26/2023)   ondansetron (ZOFRAN-ODT) 4 MG disintegrating tablet Take 1 tablet (4 mg total) by mouth every 6 (six) hours as needed for up to 20 doses for nausea or vomiting (Place under tongue). (Patient not taking: Reported on 01/26/2023)   oxyCODONE (OXY IR/ROXICODONE) 5 MG immediate release tablet Take 1 tablet (5 mg total) by mouth every 6 (six) hours as needed for severe pain. (Patient not taking: Reported on 01/26/2023)   pantoprazole (PROTONIX) 40 MG tablet Take 1 tablet (40 mg total) by mouth daily. (Patient not taking: Reported on 01/26/2023)   polyethylene glycol powder (GLYCOLAX/MIRALAX) 17 GM/SCOOP powder Take 17 g by mouth once. (Patient not taking: Reported on 01/26/2023)   No facility-administered encounter medications on file as of 01/26/2023.     Patient Active Problem List   Diagnosis Date Noted   Colostomy in place [facility]) 10/27/2022   Gross hematuria    Symptomatic anemia    Hypertension    Community acquired  pneumonia    Acute pulmonary embolism (HCC) 02/26/2021   Acute DVT (deep venous thrombosis) (HCC) 02/26/2021   Occult blood in stools 02/26/2021   Pyuria, sterile 02/26/2021   Acute pulmonary embolism without acute cor pulmonale (HCC) 02/26/2021   Alcohol dependency (HCC) 02/20/2019   Chronic otitis externa of left ear 06/30/2018   Headache 10/15/2015   Altered bowel elimination due to intestinal ostomy (HCC) 02/04/2014   Acute kidney injury (HCC) 02/02/2014   Neuralgia, post-herpetic 01/02/2014   HIV disease (HCC) 03/24/2012   Anemia 03/23/2012   Essential hypertension 03/29/2007     Health Maintenance Due  Topic Date Due   Medicare Annual Wellness (AWV)  Never done   DTaP/Tdap/Td (1 - Tdap) Never done   Zoster Vaccines- Shingrix (1 of 2) Never done   COVID-19 Vaccine (2 - Pfizer risk series) 04/08/2022   INFLUENZA VACCINE  11/04/2022     Review of Systems  Constitutional: Negative for fever, chills, diaphoresis, activity change, appetite change, fatigue and unexpected weight change.  HENT: Negative for congestion, sore throat, rhinorrhea, sneezing, trouble swallowing and sinus pressure.  Eyes: Negative for photophobia and visual disturbance.  Respiratory: Negative for cough, chest tightness, shortness of breath, wheezing and stridor.  Cardiovascular: Negative for chest pain, palpitations and leg swelling.  Gastrointestinal: Negative for nausea, vomiting, abdominal pain, diarrhea, constipation, blood in stool, abdominal distention and anal bleeding.  Genitourinary: Negative for dysuria, hematuria, flank pain and difficulty urinating.  Musculoskeletal: Negative for myalgias, back pain, joint swelling, arthralgias and gait problem.  Skin: Negative for color change, pallor, rash and wound.  Neurological: Negative for dizziness, tremors, weakness and light-headedness.  Hematological: Negative for adenopathy. Does not bruise/bleed easily.  Psychiatric/Behavioral: Negative for  behavioral problems, confusion, sleep disturbance, dysphoric mood, decreased concentration and agitation.   Physical Exam   BP (!) 142/95   Pulse 63   Temp 97.7 F (36.5 C) (Temporal)   Ht 6\' 1"  (1.854 m)   Wt 255 lb (115.7 kg)   SpO2 96%   BMI 33.64 kg/m   Physical Exam  Constitutional: He is oriented to person, place, and time. He appears well-developed and well-nourished. No distress.  HENT:  Mouth/Throat: Oropharynx is clear and moist. No oropharyngeal exudate.  Cardiovascular: Normal rate, regular rhythm and normal heart sounds. Exam reveals no gallop and no friction rub.  No murmur heard.  Pulmonary/Chest: Effort normal and breath sounds normal. No respiratory distress. He has no wheezes.  Abdominal: Soft. Bowel sounds are decreased He exhibits + distension. There is no tenderness.  Lymphadenopathy:  He has no cervical adenopathy.  Neurological: He is alert and oriented to person, place, and time.  Skin: Skin is warm and dry. No rash noted. No erythema.  Psychiatric: He has a normal mood and affect. His behavior is normal.   Lab Results  Component Value Date   CD4TCELL 23 (L) 11/26/2021   Lab Results  Component Value Date   CD4TABS 525 11/26/2021   CD4TABS 721 05/08/2020   CD4TABS 659 12/19/2019   Lab Results  Component Value Date   HIV1RNAQUANT <20 DETECTED (A) 12/15/2021   No results found for: "HEPBSAB" Lab Results  Component Value Date   LABRPR NON-REACTIVE 11/26/2021    CBC Lab Results  Component Value Date   WBC 8.6 10/29/2022   RBC 4.42 10/29/2022   HGB 13.6 10/29/2022   HCT 41.5 10/29/2022   PLT 223 10/29/2022   MCV 93.9 10/29/2022   MCH 30.8 10/29/2022   MCHC 32.8 10/29/2022   RDW 15.1 10/29/2022   LYMPHSABS 2,678 11/26/2021   MONOABS 0.8 02/27/2021   EOSABS 59 11/26/2021    BMET Lab Results  Component Value Date   NA 134 (L) 10/29/2022   K 3.8 10/29/2022   CL 99 10/29/2022   CO2 25 10/29/2022   GLUCOSE 135 (H) 10/29/2022   BUN 23  (H) 10/29/2022   CREATININE 0.95 10/29/2022   CALCIUM 9.2 10/29/2022   GFRNONAA >60 10/29/2022   GFRAA 120 05/08/2020      Assessment and Plan  Hiv disease= will check labs, refill triumeq  Long term medication management = will check cr  Frequent urination = will get ua but also do trial of flomax to see if that helps; if ongoing see pcp  Abdominal distention = consider trial of simethicone to see if it alleviates discomfort

## 2023-01-27 LAB — URINE CULTURE
MICRO NUMBER:: 15634297
Result:: NO GROWTH
SPECIMEN QUALITY:: ADEQUATE

## 2023-01-27 LAB — URINE CYTOLOGY ANCILLARY ONLY
Chlamydia: NEGATIVE
Comment: NEGATIVE
Comment: NORMAL
Neisseria Gonorrhea: NEGATIVE

## 2023-01-27 LAB — URINALYSIS, ROUTINE W REFLEX MICROSCOPIC
Bacteria, UA: NONE SEEN /[HPF]
Bilirubin Urine: NEGATIVE
Glucose, UA: NEGATIVE
Hgb urine dipstick: NEGATIVE
Hyaline Cast: NONE SEEN /[LPF]
Ketones, ur: NEGATIVE
Leukocytes,Ua: NEGATIVE
Nitrite: NEGATIVE
Specific Gravity, Urine: 1.018 (ref 1.001–1.035)
Squamous Epithelial / HPF: NONE SEEN /[HPF] (ref <=5)
pH: 6.5 (ref 5.0–8.0)

## 2023-01-27 LAB — T-HELPER CELL (CD4) - (RCID CLINIC ONLY)
CD4 % Helper T Cell: 26 % — ABNORMAL LOW (ref 33–65)
CD4 T Cell Abs: 731 /uL (ref 400–1790)

## 2023-01-27 LAB — MICROSCOPIC MESSAGE

## 2023-01-29 LAB — COMPLETE METABOLIC PANEL WITH GFR
AG Ratio: 1.4 (calc) (ref 1.0–2.5)
ALT: 10 U/L (ref 9–46)
AST: 15 U/L (ref 10–35)
Albumin: 4.2 g/dL (ref 3.6–5.1)
Alkaline phosphatase (APISO): 75 U/L (ref 35–144)
BUN: 11 mg/dL (ref 7–25)
CO2: 30 mmol/L (ref 20–32)
Calcium: 9.4 mg/dL (ref 8.6–10.3)
Chloride: 101 mmol/L (ref 98–110)
Creat: 0.83 mg/dL (ref 0.70–1.30)
Globulin: 2.9 g/dL (ref 1.9–3.7)
Glucose, Bld: 109 mg/dL — ABNORMAL HIGH (ref 65–99)
Potassium: 3.4 mmol/L — ABNORMAL LOW (ref 3.5–5.3)
Sodium: 140 mmol/L (ref 135–146)
Total Bilirubin: 0.4 mg/dL (ref 0.2–1.2)
Total Protein: 7.1 g/dL (ref 6.1–8.1)
eGFR: 103 mL/min/{1.73_m2} (ref >=60)

## 2023-01-29 LAB — CBC WITH DIFFERENTIAL/PLATELET
Absolute Lymphocytes: 3231 {cells}/uL (ref 850–3900)
Absolute Monocytes: 431 {cells}/uL (ref 200–950)
Basophils Absolute: 22 {cells}/uL (ref 0–200)
Basophils Relative: 0.4 %
Eosinophils Absolute: 112 {cells}/uL (ref 15–500)
Eosinophils Relative: 2 %
HCT: 41.2 % (ref 38.5–50.0)
Hemoglobin: 14 g/dL (ref 13.2–17.1)
MCH: 31.3 pg (ref 27.0–33.0)
MCHC: 34 g/dL (ref 32.0–36.0)
MCV: 92 fL (ref 80.0–100.0)
MPV: 11.2 fL (ref 7.5–12.5)
Monocytes Relative: 7.7 %
Neutro Abs: 1803 {cells}/uL (ref 1500–7800)
Neutrophils Relative %: 32.2 %
Platelets: 227 10*3/uL (ref 140–400)
RBC: 4.48 10*6/uL (ref 4.20–5.80)
RDW: 13.9 % (ref 11.0–15.0)
Total Lymphocyte: 57.7 %
WBC: 5.6 10*3/uL (ref 3.8–10.8)

## 2023-01-29 LAB — HIV-1 RNA QUANT-NO REFLEX-BLD
HIV 1 RNA Quant: NOT DETECTED {copies}/mL
HIV-1 RNA Quant, Log: NOT DETECTED {Log_copies}/mL

## 2023-01-29 LAB — RPR: RPR Ser Ql: NONREACTIVE

## 2023-02-09 DIAGNOSIS — H9012 Conductive hearing loss, unilateral, left ear, with unrestricted hearing on the contralateral side: Secondary | ICD-10-CM | POA: Insufficient documentation

## 2023-02-14 ENCOUNTER — Other Ambulatory Visit: Payer: Self-pay | Admitting: Internal Medicine

## 2023-02-14 DIAGNOSIS — B20 Human immunodeficiency virus [HIV] disease: Secondary | ICD-10-CM

## 2023-02-22 ENCOUNTER — Other Ambulatory Visit: Payer: Self-pay | Admitting: Internal Medicine

## 2023-03-24 ENCOUNTER — Other Ambulatory Visit: Payer: Self-pay | Admitting: Internal Medicine

## 2023-04-09 ENCOUNTER — Emergency Department (HOSPITAL_COMMUNITY): Payer: 59

## 2023-04-09 ENCOUNTER — Encounter (HOSPITAL_COMMUNITY): Payer: Self-pay

## 2023-04-09 ENCOUNTER — Other Ambulatory Visit: Payer: Self-pay

## 2023-04-09 ENCOUNTER — Inpatient Hospital Stay (HOSPITAL_COMMUNITY)
Admission: EM | Admit: 2023-04-09 | Discharge: 2023-04-18 | DRG: 330 | Disposition: A | Discharge status: Home / Self Care | Payer: 59 | Attending: Internal Medicine | Admitting: Internal Medicine

## 2023-04-09 ENCOUNTER — Inpatient Hospital Stay (HOSPITAL_COMMUNITY): Payer: 59

## 2023-04-09 DIAGNOSIS — K660 Peritoneal adhesions (postprocedural) (postinfection): Secondary | ICD-10-CM | POA: Diagnosis present

## 2023-04-09 DIAGNOSIS — R350 Frequency of micturition: Secondary | ICD-10-CM | POA: Diagnosis present

## 2023-04-09 DIAGNOSIS — E785 Hyperlipidemia, unspecified: Secondary | ICD-10-CM | POA: Diagnosis present

## 2023-04-09 DIAGNOSIS — E66811 Obesity, class 1: Secondary | ICD-10-CM | POA: Diagnosis present

## 2023-04-09 DIAGNOSIS — F17210 Nicotine dependence, cigarettes, uncomplicated: Secondary | ICD-10-CM | POA: Diagnosis present

## 2023-04-09 DIAGNOSIS — Z8249 Family history of ischemic heart disease and other diseases of the circulatory system: Secondary | ICD-10-CM | POA: Diagnosis not present

## 2023-04-09 DIAGNOSIS — R809 Proteinuria, unspecified: Secondary | ICD-10-CM | POA: Diagnosis present

## 2023-04-09 DIAGNOSIS — Z6832 Body mass index (BMI) 32.0-32.9, adult: Secondary | ICD-10-CM | POA: Diagnosis not present

## 2023-04-09 DIAGNOSIS — K5669 Other partial intestinal obstruction: Secondary | ICD-10-CM | POA: Diagnosis present

## 2023-04-09 DIAGNOSIS — K5720 Diverticulitis of large intestine with perforation and abscess without bleeding: Secondary | ICD-10-CM | POA: Diagnosis present

## 2023-04-09 DIAGNOSIS — Z86711 Personal history of pulmonary embolism: Secondary | ICD-10-CM | POA: Diagnosis not present

## 2023-04-09 DIAGNOSIS — Z8719 Personal history of other diseases of the digestive system: Secondary | ICD-10-CM

## 2023-04-09 DIAGNOSIS — Z79899 Other long term (current) drug therapy: Secondary | ICD-10-CM

## 2023-04-09 DIAGNOSIS — Z980 Intestinal bypass and anastomosis status: Secondary | ICD-10-CM | POA: Diagnosis not present

## 2023-04-09 DIAGNOSIS — Z8619 Personal history of other infectious and parasitic diseases: Secondary | ICD-10-CM

## 2023-04-09 DIAGNOSIS — E871 Hypo-osmolality and hyponatremia: Secondary | ICD-10-CM | POA: Diagnosis present

## 2023-04-09 DIAGNOSIS — Z833 Family history of diabetes mellitus: Secondary | ICD-10-CM

## 2023-04-09 DIAGNOSIS — K56609 Unspecified intestinal obstruction, unspecified as to partial versus complete obstruction: Secondary | ICD-10-CM | POA: Diagnosis present

## 2023-04-09 DIAGNOSIS — I10 Essential (primary) hypertension: Secondary | ICD-10-CM | POA: Diagnosis present

## 2023-04-09 DIAGNOSIS — Z86718 Personal history of other venous thrombosis and embolism: Secondary | ICD-10-CM

## 2023-04-09 DIAGNOSIS — E876 Hypokalemia: Secondary | ICD-10-CM | POA: Diagnosis present

## 2023-04-09 DIAGNOSIS — B20 Human immunodeficiency virus [HIV] disease: Secondary | ICD-10-CM | POA: Diagnosis present

## 2023-04-09 DIAGNOSIS — Z95828 Presence of other vascular implants and grafts: Secondary | ICD-10-CM | POA: Diagnosis not present

## 2023-04-09 DIAGNOSIS — Z87891 Personal history of nicotine dependence: Secondary | ICD-10-CM | POA: Diagnosis not present

## 2023-04-09 LAB — COMPREHENSIVE METABOLIC PANEL
ALT: 11 U/L (ref 0–44)
AST: 15 U/L (ref 15–41)
Albumin: 4 g/dL (ref 3.5–5.0)
Alkaline Phosphatase: 74 U/L (ref 38–126)
Anion gap: 9 (ref 5–15)
BUN: 14 mg/dL (ref 6–20)
CO2: 27 mmol/L (ref 22–32)
Calcium: 9.2 mg/dL (ref 8.9–10.3)
Chloride: 102 mmol/L (ref 98–111)
Creatinine, Ser: 0.72 mg/dL (ref 0.61–1.24)
GFR, Estimated: >60 mL/min (ref >60)
Glucose, Bld: 130 mg/dL — ABNORMAL HIGH (ref 70–99)
Potassium: 2.7 mmol/L — CL (ref 3.5–5.1)
Sodium: 138 mmol/L (ref 135–145)
Total Bilirubin: 0.5 mg/dL (ref 0.0–1.2)
Total Protein: 8 g/dL (ref 6.5–8.1)

## 2023-04-09 LAB — CBC
HCT: 44.3 % (ref 39.0–52.0)
Hemoglobin: 15 g/dL (ref 13.0–17.0)
MCH: 30.7 pg (ref 26.0–34.0)
MCHC: 33.9 g/dL (ref 30.0–36.0)
MCV: 90.6 fL (ref 80.0–100.0)
Platelets: 226 10*3/uL (ref 150–400)
RBC: 4.89 MIL/uL (ref 4.22–5.81)
RDW: 13.2 % (ref 11.5–15.5)
WBC: 6.2 10*3/uL (ref 4.0–10.5)
nRBC: 0 % (ref 0.0–0.2)

## 2023-04-09 LAB — PHOSPHORUS: Phosphorus: 3.8 mg/dL (ref 2.5–4.6)

## 2023-04-09 LAB — URINALYSIS, ROUTINE W REFLEX MICROSCOPIC
Bacteria, UA: NONE SEEN
Bilirubin Urine: NEGATIVE
Glucose, UA: NEGATIVE mg/dL
Hgb urine dipstick: NEGATIVE
Ketones, ur: NEGATIVE mg/dL
Leukocytes,Ua: NEGATIVE
Nitrite: NEGATIVE
Protein, ur: 100 mg/dL — AB
Specific Gravity, Urine: 1.019 (ref 1.005–1.030)
pH: 5 (ref 5.0–8.0)

## 2023-04-09 LAB — LIPASE, BLOOD: Lipase: 34 U/L (ref 11–51)

## 2023-04-09 LAB — GLUCOSE, CAPILLARY: Glucose-Capillary: 126 mg/dL — ABNORMAL HIGH (ref 70–99)

## 2023-04-09 LAB — MAGNESIUM: Magnesium: 2.1 mg/dL (ref 1.7–2.4)

## 2023-04-09 MED ORDER — POTASSIUM CHLORIDE 10 MEQ/100ML IV SOLN
10.0000 meq | INTRAVENOUS | Status: AC
  Administered 2023-04-09 (×6): 10 meq via INTRAVENOUS
  Filled 2023-04-09 (×6): qty 100

## 2023-04-09 MED ORDER — PANTOPRAZOLE SODIUM 40 MG IV SOLR
40.0000 mg | INTRAVENOUS | Status: DC
  Administered 2023-04-09 – 2023-04-14 (×6): 40 mg via INTRAVENOUS
  Filled 2023-04-09 (×6): qty 10

## 2023-04-09 MED ORDER — ACETAMINOPHEN 325 MG PO TABS: 650.0000 mg | ORAL_TABLET | Freq: Four times a day (QID) | ORAL | Status: DC | PRN

## 2023-04-09 MED ORDER — METOPROLOL TARTRATE 5 MG/5ML IV SOLN
5.0000 mg | Freq: Three times a day (TID) | INTRAVENOUS | Status: DC
  Administered 2023-04-09 – 2023-04-15 (×16): 5 mg via INTRAVENOUS
  Filled 2023-04-09 (×16): qty 5

## 2023-04-09 MED ORDER — ONDANSETRON HCL 4 MG PO TABS: 4.0000 mg | ORAL_TABLET | Freq: Four times a day (QID) | ORAL | Status: DC | PRN

## 2023-04-09 MED ORDER — ENALAPRILAT 1.25 MG/ML IV SOLN
1.2500 mg | Freq: Four times a day (QID) | INTRAVENOUS | Status: DC
  Administered 2023-04-09 – 2023-04-15 (×17): 1.25 mg via INTRAVENOUS
  Filled 2023-04-09 (×25): qty 1

## 2023-04-09 MED ORDER — KCL IN DEXTROSE-NACL 20-5-0.45 MEQ/L-%-% IV SOLN
INTRAVENOUS | Status: AC
  Filled 2023-04-09 (×3): qty 1000

## 2023-04-09 MED ORDER — ACETAMINOPHEN 650 MG RE SUPP: 650.0000 mg | Freq: Four times a day (QID) | RECTAL | Status: DC | PRN

## 2023-04-09 MED ORDER — IOHEXOL 300 MG/ML  SOLN
100.0000 mL | Freq: Once | INTRAMUSCULAR | Status: AC | PRN
  Administered 2023-04-09: 100 mL via INTRAVENOUS

## 2023-04-09 MED ORDER — ONDANSETRON HCL 4 MG/2ML IJ SOLN
4.0000 mg | Freq: Four times a day (QID) | INTRAMUSCULAR | Status: DC | PRN
  Administered 2023-04-16: 4 mg via INTRAVENOUS
  Filled 2023-04-09 (×2): qty 2

## 2023-04-09 MED ORDER — HYDROMORPHONE HCL 1 MG/ML IJ SOLN
1.0000 mg | INTRAMUSCULAR | Status: DC | PRN
  Administered 2023-04-10: 1 mg via INTRAVENOUS
  Filled 2023-04-09: qty 1

## 2023-04-09 MED ORDER — PROCHLORPERAZINE EDISYLATE 10 MG/2ML IJ SOLN
10.0000 mg | Freq: Four times a day (QID) | INTRAMUSCULAR | Status: DC | PRN
  Administered 2023-04-10 – 2023-04-16 (×2): 10 mg via INTRAVENOUS
  Filled 2023-04-09 (×2): qty 2

## 2023-04-09 MED ORDER — MAGNESIUM SULFATE 2 GM/50ML IV SOLN
2.0000 g | Freq: Once | INTRAVENOUS | Status: AC
  Administered 2023-04-09: 2 g via INTRAVENOUS
  Filled 2023-04-09: qty 50

## 2023-04-09 MED ORDER — ENOXAPARIN SODIUM 60 MG/0.6ML IJ SOSY
55.0000 mg | PREFILLED_SYRINGE | INTRAMUSCULAR | Status: DC
  Administered 2023-04-09 – 2023-04-12 (×4): 55 mg via SUBCUTANEOUS
  Filled 2023-04-09 (×4): qty 0.6

## 2023-04-09 NOTE — H&P (Signed)
 History and Physical    Patient: Leon Nichols FMW:[id number] DOB: 1966/02/10 DOA: 04/09/2023 DOS: the patient was seen and examined on 04/09/2023 PCP: [facility]  Patient coming from: Home  Chief Complaint:  Chief Complaint  Patient presents with   Abdominal Pain   Nausea   HPI: Leon Nichols is a 58 y.o. male with medical history significant of back pain, history of colitis, diverticulosis, DVT, hemorrhoids, herpes zoster, HIV, hypertension, multiple episodes of perianal abscess, perirectal abscess, septic arthritis who had a diverting loop descending colostomy in 2013 due to recurrent perineal infections.  He improved over time and Dr. Bernarda Ned performed reversal of the colostomy in July of this year.  However, the patient has been having increasing abdominal distention associated with nausea and no emesis.  He has been diarrhea without melena or hematochezia.  No constipation, flank pain, dysuria, frequency or hematuria. He denied fever, chills, rhinorrhea, sore throat, wheezing or hemoptysis.  No chest pain, palpitations, diaphoresis, PND, orthopnea or pitting edema of the lower extremities. No polyuria, polydipsia, polyphagia or blurred vision.   Lab work: Urine analysis showed proteinuria 100 mg deciliter but was otherwise unremarkable.  Normal CBC, lipase, phosphorus and magnesium .  CMP showed a potassium of 2.7 mmol/L and a glucose of 130 mg/dL, the rest of the CMP values were within expected range.  Imaging: CT abdomen/pelvis with contrast showing a severely dilated colon with a transition point in the left hemipelvis just above the area of the suture line further colostomy takedown and anastomosis.  The tissue in this location is nodular and thickened.  Please correlate for etiology of the developing potential high-grade obstruction.  In principle neoplasm is not excluded.  There is no pneumatosis or free air.  Few prominent mesenteric lymph nodes.  IVC filter.  ED course:  Initial vital signs were temperature 97.7 F, pulse 82, respiration 17, BP 167/117 mmHg and O2 sat 97% on room air.   Review of Systems: As mentioned in the history of present illness. All other systems reviewed and are negative. Past Medical History:  Diagnosis Date   Acute back pain 10/28/2016   Colitis    Diverticulosis    DVT (deep venous thrombosis) (HCC)    Hemorrhoids    History of shingles    HIV (human immunodeficiency virus infection) (HCC) 03/24/2012   Hypertension    Perianal abscess 02/06/2015   Perirectal abscess    Septic arthritis (HCC) 02/05/2015   Past Surgical History:  Procedure Laterality Date   arthroscopic wash out of right knee     COLONOSCOPY  08/31/2011   Procedure: COLONOSCOPY;  Surgeon: Norleen LOISE Kiang, MD;  [facility] END[facility]COPY;  Service: Endoscopy;  Laterality: N/A;   COLONOSCOPY WITH PROPOFOL  N/A 02/28/2021   Procedure: COLONOSCOPY WITH PROPOFOL ;  Surgeon: Burnette Fallow, MD;  Location: [facility] ENDOSCOPY;  Service: Endoscopy;  Laterality: N/A;   COLOSTOMY CLOSURE N/A 10/27/2022   Procedure: OPEN CLOSURE OF LOOP COLOSTOMY;  Surgeon: Ned Bernarda, MD;  Location: [facility] ORS;  Service: General;  Laterality: N/A;   DRESSING CHANGE UNDER ANESTHESIA  03/27/2012   Procedure: DRESSING CHANGE UNDER ANESTHESIA;  Surgeon: Sherlean JINNY Laughter, MD;  Location: [facility] OR;  Service: General;  Laterality: N/A;   ESOPHAGOGASTRODUODENOSCOPY (EGD) WITH PROPOFOL  N/A 02/28/2021   Procedure: ESOPHAGOGASTRODUODENOSCOPY (EGD) WITH PROPOFOL ;  Surgeon: Burnette Fallow, MD;  Location: [facility] ENDOSCOPY;  Service: Endoscopy;  Laterality: N/A;   INCISION AND DRAINAGE PERIRECTAL ABSCESS  03/23/2012   Procedure: IRRIGATION AND DEBRIDEMENT PERIRECTAL ABSCESS;  Surgeon: Morene ONEIDA Olives, MD;  [facility];  Service: General;  Laterality: N/A;  I & D soft tissue scrotal and perineal abscess   IR IVC FILTER PLMT / S&I PORTER GUID/MOD SED  02/27/2021   KNEE ARTHROSCOPY Right 02/05/2015   Procedure:  ARTHROSCOPIC [facility] OUT RIGHT KNEE;  Surgeon: Evalene JONETTA Chancy, MD;  Location: [facility] OR;  Service: Orthopedics;  Laterality: Right;   LAPAROSCOPIC DIVERTED COLOSTOMY  03/27/2012   Procedure: LAPAROSCOPIC DIVERTED COLOSTOMY;  Surgeon: Sherlean JINNY Laughter, MD;  Location: [facility] OR;  Service: General;  Laterality: N/A;   Social History:  reports that he has quit smoking. His smoking use included cigarettes. He has a 3.8 pack-year smoking history. He has never used smokeless tobacco. He reports that he does not currently use alcohol after a past usage of about 50.0 standard drinks of alcohol per week. He reports that he does not currently use drugs after having used the following drugs: Marijuana and Cocaine.  No Known Allergies  Family History  Problem Relation Age of Onset   Hypertension Mother    Diabetes Mother     Prior to Admission medications   Medication Sig Start Date End Date Taking? Authorizing Provider  amLODipine  (NORVASC ) 10 MG tablet TAKE 1 TABLET(10 MG) BY MOUTH DAILY 12/15/21   Luiz Channel, MD  bisacodyl  (DULCOLAX) 5 MG EC tablet Take 5 mg by mouth daily as needed. Patient not taking: Reported on 01/26/2023 09/13/22   [provider]  ibuprofen  (ADVIL ) 800 MG tablet Take 1 tablet (800 mg total) by mouth every 8 (eight) hours as needed. 10/31/22   Kinsinger, Herlene Righter, MD  lisinopril  (ZESTRIL ) 20 MG tablet TAKE 1 TABLET(20 MG) BY MOUTH DAILY 03/24/23   Luiz Channel, MD  ondansetron  (ZOFRAN -ODT) 4 MG disintegrating tablet Take 1 tablet (4 mg total) by mouth every 6 (six) hours as needed for up to 20 doses for nausea or vomiting (Place under tongue). Patient not taking: Reported on 01/26/2023 10/31/22   Kinsinger, Herlene Righter, MD  oxyCODONE  (OXY IR/ROXICODONE ) 5 MG immediate release tablet Take 1 tablet (5 mg total) by mouth every 6 (six) hours as needed for severe pain. Patient not taking: Reported on 01/26/2023 10/31/22   Kinsinger, Herlene Righter, MD  pantoprazole  (PROTONIX ) 40 MG  tablet Take 1 tablet (40 mg total) by mouth daily. Patient not taking: Reported on 01/26/2023 12/15/21   Luiz Channel, MD  polyethylene glycol powder (GLYCOLAX /MIRALAX ) 17 GM/SCOOP powder Take 17 g by mouth once. Patient not taking: Reported on 01/26/2023 09/13/22   [provider]  rosuvastatin  (CRESTOR ) 10 MG tablet TAKE 1 TABLET(10 MG) BY MOUTH DAILY 03/24/23   Luiz Channel, MD  tamsulosin  (FLOMAX ) 0.4 MG CAPS capsule Take 1 capsule (0.4 mg total) by mouth daily after supper. 01/26/23   Luiz Channel, MD  TRIUMEQ  600-50-300 MG tablet TAKE 1 TABLET BY MOUTH DAILY 02/14/23   Luiz Channel, MD    Physical Exam: Vitals:   04/09/23 0838 04/09/23 1145 04/09/23 1200 04/09/23 1252  BP: (!) 167/117 (!) 162/106 (!) 166/114   Pulse: 82 81 82   Resp: 17 16 17    Temp: 97.7 F (36.5 C)   97.9 F (36.6 C)  SpO2: 97% 94% 98%   Weight:      Height:       Physical Exam Vitals and nursing note reviewed.  Constitutional:      Appearance: He is well-developed. He is obese.  HENT:     Head: Normocephalic.  Nose: No rhinorrhea.     Mouth/Throat:     Mouth: Mucous membranes are dry.  Eyes:     General: No scleral icterus.    Pupils: Pupils are equal, round, and reactive to light.  Cardiovascular:     Rate and Rhythm: Normal rate and regular rhythm.  Pulmonary:     Effort: Pulmonary effort is normal.     Breath sounds: Normal breath sounds. No wheezing, rhonchi or rales.  Abdominal:     General: Abdomen is protuberant. Bowel sounds are normal. There is distension.     Palpations: Abdomen is soft.     Tenderness: There is abdominal tenderness.  Musculoskeletal:     Cervical back: Neck supple.     Right lower leg: No edema.     Left lower leg: No edema.  Skin:    General: Skin is warm and dry.  Neurological:     General: No focal deficit present.     Mental Status: He is alert and oriented to person, place, and time.  Psychiatric:        Mood and Affect: Mood normal.         Behavior: Behavior normal.     Data Reviewed:  Results are pending, will review when available.  Assessment and Plan: Principal Problem:   Bowel obstruction (HCC) Inpatient/telemetry. Keep NPO. Continue NTG with no suction. Continue IV fluids. Analgesics as needed. Antiemetics as needed. Pantoprazole  40 mg IVP every 24 hours. Keep electrolytes optimized. Follow-up CBC and CMP in AM. Follow-up imaging in the morning. General surgery consult appreciated.  Active Problems:   Hypokalemia Supplementing. Magnesium  sulfate 2 g IVPB. Follow-up potassium level.    HIV disease (HCC) Resume Triumeq  600-50-300 mg tablet p.o. daily. Follow-up with infectious disease as an outpatient.    Hypertension Begin metoprolol  5 mg IVP every 8 hours. Enalapril 1.25 mg IVP every 6 hours. Given obstruction, consider switching amlodipine  to another agent.    History of pulmonary embolism   History of DVT (deep vein thrombosis) Has undergone IVC filter placement. Imaging shows filter in place.    Hyperlipidemia Resume rosuvastatin  once cleared for diet.    Class 1 obesity Current BMI 32.32 kg/m. Needs lifestyle modifications. Follow-up closely with PCP.    Advance Care Planning:   Code Status: Full Code   Consults: [facility] Alverda Poli, MD). [facility] Thermon Cree, MD).  Family Communication:   Severity of Illness: The appropriate patient status for this patient is INPATIENT. Inpatient status is judged to be reasonable and necessary in order to provide the required intensity of service to ensure the patient's safety. The patient's presenting symptoms, physical exam findings, and initial radiographic and laboratory data in the context of their chronic comorbidities is felt to place them at high risk for further clinical deterioration. Furthermore, it is not anticipated that the patient will be medically stable for discharge from the hospital within 2  midnights of admission.   * I certify that at the point of admission it is my clinical judgment that the patient will require inpatient hospital care spanning beyond 2 midnights from the point of admission due to high intensity of service, high risk for further deterioration and high frequency of surveillance required.*  Author: Alm Dorn Castor, MD 04/09/2023 1:41 PM  For on call review [email or web address].   This document was prepared using Dragon voice recognition software and may contain some unintended transcription errors.

## 2023-04-09 NOTE — ED Provider Notes (Signed)
 [place] EMERGENCY DEPARTMENT AT [facility] Provider Note   CSN: [id number] Arrival date & time: 04/09/23  0831     History  Chief Complaint  Patient presents with   Abdominal Pain   Nausea    Leon Nichols is a 58 y.o. male.  Patient to ED with abdominal pain and distention. No fever, no vomiting. He states when he eats he feels like a rock in his stomach that does not move. He is having bowel movement and passing gas. No hematochezia. He reports urinary frequency. History of HIV - undetectable. No liver disease or history of ascites or paracentesis. The distention is constant and progressive. He was sent for MRI pelvis yesterday and today was informed he has a bowel obstruction and needed to come to the ED.   The history is provided by the patient. No language interpreter was used.  Abdominal Pain      Home Medications Prior to Admission medications   Medication Sig Start Date End Date Taking? Authorizing Provider  amLODipine  (NORVASC ) 10 MG tablet TAKE 1 TABLET(10 MG) BY MOUTH DAILY 12/15/21   Luiz Channel, MD  bisacodyl  (DULCOLAX) 5 MG EC tablet Take 5 mg by mouth daily as needed. Patient not taking: Reported on 01/26/2023 09/13/22   [provider]  ibuprofen  (ADVIL ) 800 MG tablet Take 1 tablet (800 mg total) by mouth every 8 (eight) hours as needed. 10/31/22   Kinsinger, Herlene Righter, MD  lisinopril  (ZESTRIL ) 20 MG tablet TAKE 1 TABLET(20 MG) BY MOUTH DAILY 03/24/23   Luiz Channel, MD  ondansetron  (ZOFRAN -ODT) 4 MG disintegrating tablet Take 1 tablet (4 mg total) by mouth every 6 (six) hours as needed for up to 20 doses for nausea or vomiting (Place under tongue). Patient not taking: Reported on 01/26/2023 10/31/22   Kinsinger, Herlene Righter, MD  oxyCODONE  (OXY IR/ROXICODONE ) 5 MG immediate release tablet Take 1 tablet (5 mg total) by mouth every 6 (six) hours as needed for severe pain. Patient not taking: Reported on 01/26/2023 10/31/22   Kinsinger, Herlene Righter, MD  pantoprazole  (PROTONIX ) 40 MG tablet Take 1 tablet (40 mg total) by mouth daily. Patient not taking: Reported on 01/26/2023 12/15/21   Luiz Channel, MD  polyethylene glycol powder (GLYCOLAX /MIRALAX ) 17 GM/SCOOP powder Take 17 g by mouth once. Patient not taking: Reported on 01/26/2023 09/13/22   [provider]  rosuvastatin  (CRESTOR ) 10 MG tablet TAKE 1 TABLET(10 MG) BY MOUTH DAILY 03/24/23   Luiz Channel, MD  tamsulosin  (FLOMAX ) 0.4 MG CAPS capsule Take 1 capsule (0.4 mg total) by mouth daily after supper. 01/26/23   Luiz Channel, MD  TRIUMEQ  600-50-300 MG tablet TAKE 1 TABLET BY MOUTH DAILY 02/14/23   Luiz Channel, MD      Allergies    Patient has no known allergies.    Review of Systems   Review of Systems  Gastrointestinal:  Positive for abdominal pain.    Physical Exam Updated Vital Signs BP (!) 166/114   Pulse 82   Temp 97.9 F (36.6 C)   Resp 17   Ht 6' 1 (1.854 m)   Wt 111.1 kg   SpO2 98%   BMI 32.32 kg/m  Physical Exam Vitals and nursing note reviewed.  Constitutional:      General: He is not in acute distress.    Appearance: He is well-developed.  Abdominal:     General: Abdomen is protuberant. Bowel sounds are normal. There is distension (Significant distention).     Palpations:  There is no shifting dullness or fluid wave.     Tenderness: There is generalized abdominal tenderness (Mild tenderness diffusely).  Skin:    General: Skin is warm and dry.  Neurological:     Mental Status: He is alert.     ED Results / Procedures / Treatments   Labs (all labs ordered are listed, but only abnormal results are displayed) Labs Reviewed  COMPREHENSIVE METABOLIC PANEL - Abnormal; Notable for the following components:      Result Value   Potassium 2.7 (*)    Glucose, Bld 130 (*)    All other components within normal limits  URINALYSIS, ROUTINE W REFLEX MICROSCOPIC - Abnormal; Notable for the following components:   Protein, ur 100  (*)    All other components within normal limits  LIPASE, BLOOD  CBC  MAGNESIUM   PHOSPHORUS   Results for orders placed or performed during the hospital encounter of 04/09/23  Lipase, blood   Collection Time: 04/09/23  8:46 AM  Result Value Ref Range   Lipase 34 11 - 51 U/L  Comprehensive metabolic panel   Collection Time: 04/09/23  8:46 AM  Result Value Ref Range   Sodium 138 135 - 145 mmol/L   Potassium 2.7 (LL) 3.5 - 5.1 mmol/L   Chloride 102 98 - 111 mmol/L   CO2 27 22 - 32 mmol/L   Glucose, Bld 130 (H) 70 - 99 mg/dL   BUN 14 6 - 20 mg/dL   Creatinine, Ser 9.27 0.61 - 1.24 mg/dL   Calcium  9.2 8.9 - 10.3 mg/dL   Total Protein 8.0 6.5 - 8.1 g/dL   Albumin  4.0 3.5 - 5.0 g/dL   AST 15 15 - 41 U/L   ALT 11 0 - 44 U/L   Alkaline Phosphatase 74 38 - 126 U/L   Total Bilirubin 0.5 0.0 - 1.2 mg/dL   GFR, Estimated >39 >39 mL/min   Anion gap 9 5 - 15  CBC   Collection Time: 04/09/23  8:46 AM  Result Value Ref Range   WBC 6.2 4.0 - 10.5 K/uL   RBC 4.89 4.22 - 5.81 MIL/uL   Hemoglobin 15.0 13.0 - 17.0 g/dL   HCT 55.6 60.9 - 47.9 %   MCV 90.6 80.0 - 100.0 fL   MCH 30.7 26.0 - 34.0 pg   MCHC 33.9 30.0 - 36.0 g/dL   RDW 86.7 88.4 - 84.4 %   Platelets 226 150 - 400 K/uL   nRBC 0.0 0.0 - 0.2 %  Urinalysis, Routine w reflex microscopic -Urine, Clean Catch   Collection Time: 04/09/23  8:46 AM  Result Value Ref Range   Color, Urine YELLOW YELLOW   APPearance CLEAR CLEAR   Specific Gravity, Urine 1.019 1.005 - 1.030   pH 5.0 5.0 - 8.0   Glucose, UA NEGATIVE NEGATIVE mg/dL   Hgb urine dipstick NEGATIVE NEGATIVE   Bilirubin Urine NEGATIVE NEGATIVE   Ketones, ur NEGATIVE NEGATIVE mg/dL   Protein, ur 899 (A) NEGATIVE mg/dL   Nitrite NEGATIVE NEGATIVE   Leukocytes,Ua NEGATIVE NEGATIVE   RBC / HPF 0-5 0 - 5 RBC/hpf   WBC, UA 0-5 0 - 5 WBC/hpf   Bacteria, UA NONE SEEN NONE SEEN   Squamous Epithelial / HPF 0-5 0 - 5 /HPF   Mucus PRESENT     EKG None  Radiology CT ABDOMEN  PELVIS W CONTRAST Result Date: 04/09/2023 CLINICAL DATA:  Acute abdominal pain.  Previous colostomy takedown. EXAM: CT ABDOMEN AND PELVIS WITH CONTRAST  TECHNIQUE: Multidetector CT imaging of the abdomen and pelvis was performed using the standard protocol following bolus administration of intravenous contrast. RADIATION DOSE REDUCTION: This exam was performed according to the departmental dose-optimization program which includes automated exposure control, adjustment of the mA and/or kV according to patient size and/or use of iterative reconstruction technique. CONTRAST:  OMNIPAQUE  IOHEXOL  300 MG/ML  SOLN COMPARISON:  CT 11/26/2021. FINDINGS: Lower chest: Bandlike opacities along the lung bases. Atelectasis is favored. Some breathing motion. Mild ground-glass. Hepatobiliary: Preserved hepatic parenchyma. Patent portal vein. Gallbladder is nondilated. Pancreas: Unremarkable. No pancreatic ductal dilatation or surrounding inflammatory changes. Spleen: Normal in size without focal abnormality. Adrenals/Urinary Tract: Adrenal glands are unremarkable. Kidneys are normal, without renal calculi, focal lesion, or hydronephrosis. Bladder is unremarkable. Stomach/Bowel: Surgical changes identified along the distal descending, sigmoid colon with a primary anastomosis. There is thickening along the adjacent anterior abdominal wall in the area of previous ostomy. There is a nodular soft tissue thickening in this location along the bowel with the proximal dilatation of the colon measuring up to 14.6 cm. Air-fluid levels along the proximal colon. A developing high-grade obstruction is a concern. Please correlate clinical findings. The small bowel and stomach are nondilated. Slight thickening in the area of the rectum and anal region, nonspecific. Vascular/Lymphatic: Normal caliber aorta and IVC. There is a IVC filter in place. Are some small mesenteric nodes identified including immediately adjacent to the area of the  transition point in the left hemipelvis with a node measuring 14 by 8 mm on series 2, image 70. Reproductive: Prostate is unremarkable. Other: No free air, free fluid. Musculoskeletal: Mild degenerative changes of the spine and pelvis. Critical Value/emergent results were called by telephone at the time of interpretation on 04/09/2023 at 10:00 amPST to provider [facility] Alberto Pina , who verbally acknowledged these results. IMPRESSION: Severely dilated colon with a transition point in the left hemipelvis just above the area of the suture line for the colostomy takedown and anastomosis. The tissue in this location is nodular and thickened. Please correlate for etiology of the developing potential high-grade obstruction. Neoplasm in principle is not excluded. No pneumatosis or free air. Few prominent mesenteric lymph nodes. IVC filter. Electronically Signed   By: Ranell Bring M.D.   On: 04/09/2023 13:02    Procedures Procedures    Medications Ordered in ED Medications  potassium chloride  10 mEq in 100 mL IVPB (has no administration in time range)  iohexol  (OMNIPAQUE ) 300 MG/ML solution 100 mL (100 mLs Intravenous Contrast Given 04/09/23 1230)    ED Course/ Medical Decision Making/ A&P                                 Medical Decision Making This patient presents to the ED for concern of abdominal swelling, this involves an extensive number of treatment options, and is a complaint that carries with it a high risk of complications and morbidity.  The differential diagnosis includes bowel obstruction, ascites, perforation   Co morbidities that complicate the patient evaluation  Recent abdominal surgery: 10/2022, colostomy reversal (ostomy secondary to diverticulitis) HIV (stable), HTN   Additional history obtained:  Additional history and/or information obtained from chart review, notable for Colostomy reversal 10/2022; MRI pelvis performed yesterday WITHOUT evidence obstruction  Lab Tests:  I  Ordered, and personally interpreted labs.  The pertinent results include:   UA negative; K+ 2.7, Hgb normal, no leukocytosis    Imaging  Studies ordered:  I ordered imaging studies including CT abd/pel Per radiologist interpretation:  IMPRESSION: Severely dilated colon with a transition point in the left hemipelvis just above the area of the suture line for the colostomy takedown and anastomosis. The tissue in this location is nodular and thickened. Please correlate for etiology of the developing potential high-grade obstruction. Neoplasm in principle is not excluded.   No pneumatosis or free air.   Few prominent mesenteric lymph nodes.   IVC filter.      Test Considered:  CT abd/pel - MRI provided limited view in patient with significant abdominal distention   Consultations Obtained:  I requested consultation with the gen surgery, Dr. Vernetta,  and discussed lab and imaging findings as well as pertinent plan - they recommend: admit to medicine, advises NG tube placement  Hospitalist paged   Problem List / ED Course:  Progressively worsening abdominal distention, discomfort since surgery in July CT today showing obstruction, transition point at colostomy take-down Patient continues to decline need for pain medication, appears comfortable.  Discussed with surgery - to admit to hospitalist.  Surgery also suggests GI consultation - Dr. Burnette made aware via secure chat   Reevaluation:  After the interventions noted above, I reevaluated the patient and found that they have : recheck - he continues to decline the need for pain medications   Social Determinants of Health:  Well established medical care, compliant   Disposition:  After consideration of the diagnostic results and the patients response to treatment, I feel that the patient would benefit from admit.   Amount and/or Complexity of Data Reviewed Labs: ordered. Radiology: ordered.  Risk Prescription  drug management. Decision regarding hospitalization.           Final Clinical Impression(s) / ED Diagnoses Final diagnoses:  Intestinal obstruction, unspecified cause, unspecified whether partial or complete [facility] [place])    Rx / DC Orders ED Discharge Orders     None         Odell Balls, PA-C 04/09/23 1344    Dean Clarity, MD 04/09/23 [id number]

## 2023-04-09 NOTE — ED Triage Notes (Signed)
 Pt arrives via POV. PT reports over the past several months, he has been experiencing abdominal distention. Pt reports he had an outpatient MRI yesterday and was told that he had a bowel blockage. Pt reports abdominal and nausea. Pt AxOx4.

## 2023-04-09 NOTE — Plan of Care (Signed)

## 2023-04-09 NOTE — Consult Note (Signed)
 Reason for Consult:large bowel distension Referring Physician: Dr. JONETTA. Leon Nichols Leon Nichols is an 58 y.o. male.  HPI: This is a 58 year old gentleman who presents with several months of increasing abdominal distention.  He had undergone a takedown of a loop colostomy by Dr. Russell Ned in July 2024.  The patient had a prior diverting loop descending colostomy performed in 2013 for recurrent perineal infections.  He had finally improved and saw Dr. Ned who agreed that the ostomy can be reversed.  He initially did well postoperatively but again has been having increasing abdominal distention.  He has had nausea but no vomiting.  He reports he has had loose bowel movements and passing some flatus.  His primary care provider performed an MRI on the patient and the suggested by obstruction so he was told to come to the emergency department.  He saw Dr. Ned in early December but at that point the distention was felt to be potentially ascites.  He does have a previous history of HIV and DVTs as well as the previous perineal infections.  Past Medical History:  Diagnosis Date   Acute back pain 10/28/2016   Colitis    Diverticulosis    DVT (deep venous thrombosis) (HCC)    Hemorrhoids    History of shingles    HIV (human immunodeficiency virus infection) (HCC) 03/24/2012   Hypertension    Perianal abscess 02/06/2015   Perirectal abscess    Septic arthritis (HCC) 02/05/2015    Past Surgical History:  Procedure Laterality Date   arthroscopic wash out of right knee     COLONOSCOPY  08/31/2011   Procedure: COLONOSCOPY;  Surgeon: Norleen LOISE Kiang, MD;  Location: [facility] ENDOSCOPY;  Service: Endoscopy;  Laterality: N/A;   COLONOSCOPY WITH PROPOFOL  N/A 02/28/2021   Procedure: COLONOSCOPY WITH PROPOFOL ;  Surgeon: Burnette Fallow, MD;  Location: [facility] ENDOSCOPY;  Service: Endoscopy;  Laterality: N/A;   COLOSTOMY CLOSURE N/A 10/27/2022   Procedure: OPEN CLOSURE OF LOOP COLOSTOMY;  Surgeon: Ned Hila, MD;   Location: [facility] ORS;  Service: General;  Laterality: N/A;   DRESSING CHANGE UNDER ANESTHESIA  03/27/2012   Procedure: DRESSING CHANGE UNDER ANESTHESIA;  Surgeon: Sherlean JINNY Laughter, MD;  Location: [facility] OR;  Service: General;  Laterality: N/A;   ESOPHAGOGASTRODUODENOSCOPY (EGD) WITH PROPOFOL  N/A 02/28/2021   Procedure: ESOPHAGOGASTRODUODENOSCOPY (EGD) WITH PROPOFOL ;  Surgeon: Burnette Fallow, MD;  Location: [facility] ENDOSCOPY;  Service: Endoscopy;  Laterality: N/A;   INCISION AND DRAINAGE PERIRECTAL ABSCESS  03/23/2012   Procedure: IRRIGATION AND DEBRIDEMENT PERIRECTAL ABSCESS;  Surgeon: Morene ONEIDA Olives, MD;  Location: [facility] OR;  Service: General;  Laterality: N/A;  I & D soft tissue scrotal and perineal abscess   IR IVC FILTER PLMT / S&I PORTER GUID/MOD SED  02/27/2021   KNEE ARTHROSCOPY Right 02/05/2015   Procedure: ARTHROSCOPIC [facility] OUT RIGHT KNEE;  Surgeon: Evalene JONETTA Chancy, MD;  Location: [facility] OR;  Service: Orthopedics;  Laterality: Right;   LAPAROSCOPIC DIVERTED COLOSTOMY  03/27/2012   Procedure: LAPAROSCOPIC DIVERTED COLOSTOMY;  Surgeon: Sherlean JINNY Laughter, MD;  Location: [facility] OR;  Service: General;  Laterality: N/A;    Family History  Problem Relation Age of Onset   Hypertension Mother    Diabetes Mother     Social History:  reports that he has quit smoking. His smoking use included cigarettes. He has a 3.8 pack-year smoking history. He has never used smokeless tobacco. He reports that he does not currently use alcohol after a past usage of about 50.0 standard  drinks of alcohol per week. He reports that he does not currently use drugs after having used the following drugs: Marijuana and Cocaine.  Allergies: No Known Allergies  Medications: I have reviewed the patient's current medications.  Results for orders placed or performed during the hospital encounter of 04/09/23 (from the past 48 hours)  Lipase, blood     Status: None   Collection Time: 04/09/23  8:46 AM  Result Value Ref Range   Lipase  34 11 - 51 U/L    Comment: Performed at [facility], [address], [place], [place] [address]  Comprehensive metabolic panel     Status: Abnormal   Collection Time: 04/09/23  8:46 AM  Result Value Ref Range   Sodium 138 135 - 145 mmol/L   Potassium 2.7 (LL) 3.5 - 5.1 mmol/L    Comment: CRITICAL RESULT CALLED TO, READ BACK BY AND VERIFIED WITH BRATU, D RTN @ 1022 ON 04/08/22 CAL    Chloride 102 98 - 111 mmol/L   CO2 27 22 - 32 mmol/L   Glucose, Bld 130 (H) 70 - 99 mg/dL    Comment: Glucose reference range applies only to samples taken after fasting for at least 8 hours.   BUN 14 6 - 20 mg/dL   Creatinine, Ser 9.27 0.61 - 1.24 mg/dL   Calcium  9.2 8.9 - 10.3 mg/dL   Total Protein 8.0 6.5 - 8.1 g/dL   Albumin  4.0 3.5 - 5.0 g/dL   AST 15 15 - 41 U/L   ALT 11 0 - 44 U/L   Alkaline Phosphatase 74 38 - 126 U/L   Total Bilirubin 0.5 0.0 - 1.2 mg/dL   GFR, Estimated >39 >39 mL/min    Comment: (NOTE) Calculated using the CKD-EPI Creatinine Equation (2021)    Anion gap 9 5 - 15    Comment: Performed at [facility], [address], [place], [place] [address]  CBC     Status: None   Collection Time: 04/09/23  8:46 AM  Result Value Ref Range   WBC 6.2 4.0 - 10.5 K/uL   RBC 4.89 4.22 - 5.81 MIL/uL   Hemoglobin 15.0 13.0 - 17.0 g/dL   HCT 55.6 60.9 - 47.9 %   MCV 90.6 80.0 - 100.0 fL   MCH 30.7 26.0 - 34.0 pg   MCHC 33.9 30.0 - 36.0 g/dL   RDW 86.7 88.4 - 84.4 %   Platelets 226 150 - 400 K/uL   nRBC 0.0 0.0 - 0.2 %    Comment: Performed at [facility], [address], [place], [place] [address]  Urinalysis, Routine w reflex microscopic -Urine, Clean Catch     Status: Abnormal   Collection Time: 04/09/23  8:46 AM  Result Value Ref Range   Color, Urine YELLOW YELLOW   APPearance CLEAR CLEAR   Specific Gravity, Urine 1.019 1.005 - 1.030   pH 5.0 5.0 - 8.0   Glucose, UA NEGATIVE NEGATIVE mg/dL   Hgb urine dipstick NEGATIVE  NEGATIVE   Bilirubin Urine NEGATIVE NEGATIVE   Ketones, ur NEGATIVE NEGATIVE mg/dL   Protein, ur 899 (A) NEGATIVE mg/dL   Nitrite NEGATIVE NEGATIVE   Leukocytes,Ua NEGATIVE NEGATIVE   RBC / HPF 0-5 0 - 5 RBC/hpf   WBC, UA 0-5 0 - 5 WBC/hpf   Bacteria, UA NONE SEEN NONE SEEN   Squamous Epithelial / HPF 0-5 0 - 5 /HPF   Mucus PRESENT     Comment: Performed at  [facility], [address], [place], [place] [address]  Magnesium      Status: None   Collection Time: 04/09/23  8:46 AM  Result Value Ref Range   Magnesium  2.1 1.7 - 2.4 mg/dL    Comment: Performed at [facility], [address], [place], [place] [address]  Phosphorus     Status: None   Collection Time: 04/09/23  8:46 AM  Result Value Ref Range   Phosphorus 3.8 2.5 - 4.6 mg/dL    Comment: Performed at [facility], [address] [place], [place] [address]    DG Abdomen 1 View Result Date: 04/09/2023 CLINICAL DATA:  Post NG tube insertion EXAM: ABDOMEN - 1 VIEW COMPARISON:  CT abdomen pelvis 04/09/2023 FINDINGS: Enteric tube courses below the hemidiaphragm with tip and side port overlying the expected region the gastric lumen. Gaseous dilatation of several loops of large bowel. IVC filter noted. No radio-opaque calculi or other significant radiographic abnormality are seen. IMPRESSION: 1. Enteric tube in good position. 2. Findings suggestive of large bowel obstruction. Electronically Signed   By: Morgane  Naveau M.D.   On: 04/09/2023 14:10   CT ABDOMEN PELVIS W CONTRAST Result Date: 04/09/2023 CLINICAL DATA:  Acute abdominal pain.  Previous colostomy takedown. EXAM: CT ABDOMEN AND PELVIS WITH CONTRAST TECHNIQUE: Multidetector CT imaging of the abdomen and pelvis was performed using the standard protocol following bolus administration of intravenous contrast. RADIATION DOSE REDUCTION: This exam was performed according to the departmental dose-optimization program which includes  automated exposure control, adjustment of the mA and/or kV according to patient size and/or use of iterative reconstruction technique. CONTRAST:  OMNIPAQUE  IOHEXOL  300 MG/ML  SOLN COMPARISON:  CT 11/26/2021. FINDINGS: Lower chest: Bandlike opacities along the lung bases. Atelectasis is favored. Some breathing motion. Mild ground-glass. Hepatobiliary: Preserved hepatic parenchyma. Patent portal vein. Gallbladder is nondilated. Pancreas: Unremarkable. No pancreatic ductal dilatation or surrounding inflammatory changes. Spleen: Normal in size without focal abnormality. Adrenals/Urinary Tract: Adrenal glands are unremarkable. Kidneys are normal, without renal calculi, focal lesion, or hydronephrosis. Bladder is unremarkable. Stomach/Bowel: Surgical changes identified along the distal descending, sigmoid colon with a primary anastomosis. There is thickening along the adjacent anterior abdominal wall in the area of previous ostomy. There is a nodular soft tissue thickening in this location along the bowel with the proximal dilatation of the colon measuring up to 14.6 cm. Air-fluid levels along the proximal colon. A developing high-grade obstruction is a concern. Please correlate clinical findings. The small bowel and stomach are nondilated. Slight thickening in the area of the rectum and anal region, nonspecific. Vascular/Lymphatic: Normal caliber aorta and IVC. There is a IVC filter in place. Are some small mesenteric nodes identified including immediately adjacent to the area of the transition point in the left hemipelvis with a node measuring 14 by 8 mm on series 2, image 70. Reproductive: Prostate is unremarkable. Other: No free air, free fluid. Musculoskeletal: Mild degenerative changes of the spine and pelvis. Critical Value/emergent results were called by telephone at the time of interpretation on 04/09/2023 at 10:00 amPST to provider [facility] , who verbally acknowledged these results. IMPRESSION: Severely  dilated colon with a transition point in the left hemipelvis just above the area of the suture line for the colostomy takedown and anastomosis. The tissue in this location is nodular and thickened. Please correlate for etiology of the developing potential high-grade obstruction. Neoplasm in principle is not excluded. No pneumatosis or free air. Few prominent mesenteric  lymph nodes. IVC filter. Electronically Signed   By: Ranell Bring M.D.   On: 04/09/2023 13:02    Review of Systems  Constitutional:  Negative for chills and fever.  Respiratory:  Negative for cough and shortness of breath.   Genitourinary:  Negative for dysuria.   Blood pressure (!) 164/115, pulse 81, temperature 97.6 F (36.4 C), temperature source Oral, resp. rate 20, height 6' 1 (1.854 m), weight 111.1 kg, SpO2 98%. Physical Exam Constitutional:      General: He is not in acute distress.    Appearance: He is well-developed. He is not diaphoretic.  HENT:     Head: Normocephalic and atraumatic.  Cardiovascular:     Rate and Rhythm: Normal rate and regular rhythm.  Pulmonary:     Effort: Pulmonary effort is normal. No respiratory distress.  Abdominal:     General: Abdomen is protuberant. There is distension.     Tenderness: There is no abdominal tenderness.     Hernia: No hernia is present.     Comments: He has a very distended abdomen which is tympanitic.  There is no tenderness and no frank peritonitis  Skin:    General: Skin is warm and dry.  Neurological:     General: No focal deficit present.     Mental Status: He is alert.  Psychiatric:        Mood and Affect: Mood normal.        Behavior: Behavior normal.     Assessment/Plan: Large bowel obstruction  I have reviewed the CT scan of the abdomen pelvis and discussed the diagnosis with the patient.  This does look like the transition point is at the colon anastomosis site.  This likely represents stricture of the anastomosis although malignancy cannot be  completely ruled out. If possible, he may benefit from a lower endoscopy and possible dilation of the anastomosis.  I will discuss this with one of our colorectal surgeons in the morning and I will also notify Dr. Debby on Monday of the patient's admission.  If dilation is not possible, he will likely require surgery with revision of the anastomosis and potentially another ostomy.  Complex medical decision making  Vicenta Poli MD 04/09/2023, 4:49 PM

## 2023-04-10 DIAGNOSIS — K56609 Unspecified intestinal obstruction, unspecified as to partial versus complete obstruction: Secondary | ICD-10-CM | POA: Diagnosis not present

## 2023-04-10 LAB — BASIC METABOLIC PANEL
Anion gap: 8 (ref 5–15)
BUN: 9 mg/dL (ref 6–20)
CO2: 27 mmol/L (ref 22–32)
Calcium: 8.5 mg/dL — ABNORMAL LOW (ref 8.9–10.3)
Chloride: 101 mmol/L (ref 98–111)
Creatinine, Ser: 0.62 mg/dL (ref 0.61–1.24)
GFR, Estimated: >60 mL/min (ref >60)
Glucose, Bld: 137 mg/dL — ABNORMAL HIGH (ref 70–99)
Potassium: 2.6 mmol/L — CL (ref 3.5–5.1)
Sodium: 136 mmol/L (ref 135–145)

## 2023-04-10 MED ORDER — KCL IN DEXTROSE-NACL 20-5-0.45 MEQ/L-%-% IV SOLN
INTRAVENOUS | Status: AC
  Filled 2023-04-10 (×3): qty 1000

## 2023-04-10 MED ORDER — POTASSIUM CHLORIDE 10 MEQ/100ML IV SOLN
10.0000 meq | INTRAVENOUS | Status: AC
  Administered 2023-04-10 (×6): 10 meq via INTRAVENOUS
  Filled 2023-04-10 (×6): qty 100

## 2023-04-10 NOTE — Progress Notes (Signed)
 PROGRESS NOTE    [name]  FMW:[id number] DOB: 17-Jun-1965 DOA: 04/09/2023 PCP: [facility]    Brief Narrative:   Leon Nichols is a 58 y.o. male with medical history significant of back pain, history of colitis, diverticulosis, DVT, hemorrhoids, herpes zoster, HIV, hypertension, multiple episodes of perianal/perirectal abscess,  diverting loop descending colostomy in 2013 with subsequent reversal of the colostomy in July of this year presented to hospital with abdominal distention associated with nausea and diarrhea.  In the ED patient had elevated blood pressure otherwise vital stable.  Labs showed hypokalemia.  CBC lipase was within normal range urinalysis with proteinuria. CT abdomen/pelvis with contrast showing a severely dilated colon with a transition point in the left hemipelvis just above the area of the suture line at colostomy takedown and anastomosis.  The tissue in this location was nodular and thickened.  General surgery was consulted and patient was out of the hospital for further evaluation and treatment.  Assessment and plan.  Bowel obstruction secondary to colonic obstruction Grossly distended abdomen.  Continue n.p.o. IV fluids analgesics antiemetics Protonix .  General surgery on board and possibly colonic anastomotic site stricture.  Follow general surgery recommendation.  General surgery to decide for further recommendations.  General surgery recommending GI input as well.  Looks like Dr. Burnette GI was notified     Hypokalemia Replenished.  BMP from today pending.  Will aggressively replenish if low.     HIV disease (HCC) On Triumeq  600-50-300 as outpatient.  Follows up with ID as outpatient.  P.o. medications on hold at this time.     Hypertension Since patient is n.p.o. continue IV metoprolol , enalapril.       History of pulmonary embolism   History of DVT (deep vein thrombosis) Status post undergone IVC filter placement.  Currently on Lovenox .      Hyperlipidemia On Lipitor as outpatient.  Currently on hold.     Class 1 obesity BMI 32.32 kg/m.  Would benefit from weight loss as outpatient.     DVT prophylaxis:  On Lovenox  subcu.  Code Status:     Code Status: Full Code  Disposition: Uncertain at this time.  Likely home with home health  Status is: Inpatient Remains inpatient appropriate because: Bowel obstruction, surgical input   Family Communication: None at bedside  Consultants:  General surgery GI  Procedures:  None so far  Antimicrobials:  None  Anti-infectives (From admission, onward)    None        Subjective: Today, patient was seen and examined at bedside.  Patient complains of abdominal distention, no nausea or vomiting.  Has abdominal discomfort and pain when moving.  Has not had bowel movement or passing gas.  Complains of left ear discomfort..  Objective: Vitals:   04/09/23 2148 04/09/23 2230 04/10/23 0223 04/10/23 0610  BP: (!) 158/111 (!) 158/114 (!) 156/112 (!) 142/113  Pulse:  73 76 73  Resp: (!) 28 19 18 18   Temp:   98 F (36.7 C) 98.2 F (36.8 C)  TempSrc:   Oral Oral  SpO2:   94% 94%  Weight:      Height:        Intake/Output Summary (Last 24 hours) at 04/10/2023 1003 Last data filed at 04/10/2023 0603 Gross per 24 hour  Intake 2538.26 ml  Output 750 ml  Net 1788.26 ml   Filed Weights   04/09/23 0837 04/09/23 1807  Weight: 111.1 kg 109.9 kg    Physical Examination: Body mass  index is 31.97 kg/m.   General: Obese built, not in obvious distress HENT:   No scleral pallor or icterus noted. Oral mucosa is moist.  Chest:  Clear breath sounds.  No crackles or wheezes.  CVS: S1 &S2 heard. No murmur.  Regular rate and rhythm. Abdomen: Soft, nontender, grossly distended abdomen with absent bowel sounds, tenderness on palpation, Extremities: No cyanosis, clubbing or edema.  Peripheral pulses are palpable. Psych: Alert, awake and oriented, normal mood CNS:  No cranial nerve  deficits.  Power equal in all extremities.   Skin: Warm and dry.  No rashes noted.  Data Reviewed:   CBC: Recent Labs  Lab 04/09/23 0846  WBC 6.2  HGB 15.0  HCT 44.3  MCV 90.6  PLT 226    Basic Metabolic Panel: Recent Labs  Lab 04/09/23 0846  NA 138  K 2.7*  CL 102  CO2 27  GLUCOSE 130*  BUN 14  CREATININE 0.72  CALCIUM  9.2  MG 2.1  PHOS 3.8    Liver Function Tests: Recent Labs  Lab 04/09/23 0846  AST 15  ALT 11  ALKPHOS 74  BILITOT 0.5  PROT 8.0  ALBUMIN  4.0     Radiology Studies: DG Abdomen 1 View Result Date: 04/09/2023 CLINICAL DATA:  Post NG tube insertion EXAM: ABDOMEN - 1 VIEW COMPARISON:  CT abdomen pelvis 04/09/2023 FINDINGS: Enteric tube courses below the hemidiaphragm with tip and side port overlying the expected region the gastric lumen. Gaseous dilatation of several loops of large bowel. IVC filter noted. No radio-opaque calculi or other significant radiographic abnormality are seen. IMPRESSION: 1. Enteric tube in good position. 2. Findings suggestive of large bowel obstruction. Electronically Signed   By: Morgane  Naveau M.D.   On: 04/09/2023 14:10   CT ABDOMEN PELVIS W CONTRAST Result Date: 04/09/2023 CLINICAL DATA:  Acute abdominal pain.  Previous colostomy takedown. EXAM: CT ABDOMEN AND PELVIS WITH CONTRAST TECHNIQUE: Multidetector CT imaging of the abdomen and pelvis was performed using the standard protocol following bolus administration of intravenous contrast. RADIATION DOSE REDUCTION: This exam was performed according to the departmental dose-optimization program which includes automated exposure control, adjustment of the mA and/or kV according to patient size and/or use of iterative reconstruction technique. CONTRAST:  OMNIPAQUE  IOHEXOL  300 MG/ML  SOLN COMPARISON:  CT 11/26/2021. FINDINGS: Lower chest: Bandlike opacities along the lung bases. Atelectasis is favored. Some breathing motion. Mild ground-glass. Hepatobiliary: Preserved hepatic  parenchyma. Patent portal vein. Gallbladder is nondilated. Pancreas: Unremarkable. No pancreatic ductal dilatation or surrounding inflammatory changes. Spleen: Normal in size without focal abnormality. Adrenals/Urinary Tract: Adrenal glands are unremarkable. Kidneys are normal, without renal calculi, focal lesion, or hydronephrosis. Bladder is unremarkable. Stomach/Bowel: Surgical changes identified along the distal descending, sigmoid colon with a primary anastomosis. There is thickening along the adjacent anterior abdominal wall in the area of previous ostomy. There is a nodular soft tissue thickening in this location along the bowel with the proximal dilatation of the colon measuring up to 14.6 cm. Air-fluid levels along the proximal colon. A developing high-grade obstruction is a concern. Please correlate clinical findings. The small bowel and stomach are nondilated. Slight thickening in the area of the rectum and anal region, nonspecific. Vascular/Lymphatic: Normal caliber aorta and IVC. There is a IVC filter in place. Are some small mesenteric nodes identified including immediately adjacent to the area of the transition point in the left hemipelvis with a node measuring 14 by 8 mm on series 2, image 70. Reproductive: Prostate is unremarkable.  Other: No free air, free fluid. Musculoskeletal: Mild degenerative changes of the spine and pelvis. Critical Value/emergent results were called by telephone at the time of interpretation on 04/09/2023 at 10:00 amPST to provider [facility] , who verbally acknowledged these results. IMPRESSION: Severely dilated colon with a transition point in the left hemipelvis just above the area of the suture line for the colostomy takedown and anastomosis. The tissue in this location is nodular and thickened. Please correlate for etiology of the developing potential high-grade obstruction. Neoplasm in principle is not excluded. No pneumatosis or free air. Few prominent mesenteric  lymph nodes. IVC filter. Electronically Signed   By: Ranell Bring M.D.   On: 04/09/2023 13:02      LOS: 1 day     Vernal Alstrom, MD [facility] Available via Epic secure chat 7am-7pm After these hours, please refer to coverage provider listed on [email or web address] 04/10/2023, 10:03 AM

## 2023-04-10 NOTE — Consult Note (Signed)
 [facility] Consultation Note  Referring Provider: [facility] Primary Care Physician:  Pa, [facility] Primary Gastroenterologist:  Sampson  Reason for Consultation:  abdominal distention, suspected colonic obstruction.  HPI: Leon Nichols is a 58 y.o. male had take-down of colostomy July 2024.  Since that time, he has had slowly progressive abdominal distention and decreased stool and flatus output.  Had MRI to evaluate these symptoms recently showing profound colonic distention and was admitted as a result.  Stool soft and loose over the past few months; last flatus yesterday; last bowel movement 2 days ago.  No blood in stool.  Had colonoscopy and endoscopy for anemia in 2022, colonoscopy via ostomy with fair/poor bowel preparation with recommendation for follow-up repeat colonoscopy later, but patient was lost to follow-up and never came back to our office afterwards.  No vomiting.  CT suspicious large bowel obstruction at level of colostomy takedown.   Past Medical History:  Diagnosis Date   Acute back pain 10/28/2016   Colitis    Diverticulosis    DVT (deep venous thrombosis) (HCC)    Hemorrhoids    History of shingles    HIV (human immunodeficiency virus infection) (HCC) 03/24/2012   Hypertension    Perianal abscess 02/06/2015   Perirectal abscess    Septic arthritis (HCC) 02/05/2015    Past Surgical History:  Procedure Laterality Date   arthroscopic wash out of right knee     COLONOSCOPY  08/31/2011   Procedure: COLONOSCOPY;  Surgeon: Norleen LOISE Kiang, MD;  Location: [facility] ENDOSCOPY;  Service: Endoscopy;  Laterality: N/A;   COLONOSCOPY WITH PROPOFOL  N/A 02/28/2021   Procedure: COLONOSCOPY WITH PROPOFOL ;  Surgeon: Burnette Fallow, MD;  Location: [facility] ENDOSCOPY;  Service: Endoscopy;  Laterality: N/A;   COLOSTOMY CLOSURE N/A 10/27/2022   Procedure: OPEN CLOSURE OF LOOP COLOSTOMY;  Surgeon: Debby Hila, MD;  Location: [facility] ORS;  Service: General;  Laterality: N/A;    DRESSING CHANGE UNDER ANESTHESIA  03/27/2012   Procedure: DRESSING CHANGE UNDER ANESTHESIA;  Surgeon: Sherlean JINNY Laughter, MD;  Location: [facility] OR;  Service: General;  Laterality: N/A;   ESOPHAGOGASTRODUODENOSCOPY (EGD) WITH PROPOFOL  N/A 02/28/2021   Procedure: ESOPHAGOGASTRODUODENOSCOPY (EGD) WITH PROPOFOL ;  Surgeon: Burnette Fallow, MD;  Location: [facility] ENDOSCOPY;  Service: Endoscopy;  Laterality: N/A;   INCISION AND DRAINAGE PERIRECTAL ABSCESS  03/23/2012   Procedure: IRRIGATION AND DEBRIDEMENT PERIRECTAL ABSCESS;  Surgeon: Morene ONEIDA Olives, MD;  Location: [facility] OR;  Service: General;  Laterality: N/A;  I & D soft tissue scrotal and perineal abscess   IR IVC FILTER PLMT / S&I PORTER GUID/MOD SED  02/27/2021   KNEE ARTHROSCOPY Right 02/05/2015   Procedure: ARTHROSCOPIC [facility] OUT RIGHT KNEE;  Surgeon: Evalene JONETTA Chancy, MD;  Location: [facility] OR;  Service: Orthopedics;  Laterality: Right;   LAPAROSCOPIC DIVERTED COLOSTOMY  03/27/2012   Procedure: LAPAROSCOPIC DIVERTED COLOSTOMY;  Surgeon: Sherlean JINNY Laughter, MD;  Location: [facility] OR;  Service: General;  Laterality: N/A;    Prior to Admission medications   Medication Sig Start Date End Date Taking? Authorizing Provider  amLODipine  (NORVASC ) 10 MG tablet TAKE 1 TABLET(10 MG) BY MOUTH DAILY 12/15/21  Yes Luiz Channel, MD  gabapentin  (NEURONTIN ) 100 MG capsule Take 100 mg by mouth at bedtime.   Yes [provider]  lisinopril  (ZESTRIL ) 20 MG tablet TAKE 1 TABLET(20 MG) BY MOUTH DAILY 03/24/23  Yes Luiz Channel, MD  rosuvastatin  (CRESTOR ) 10 MG tablet TAKE 1 TABLET(10 MG) BY MOUTH DAILY 03/24/23  Yes Luiz Channel, MD  TRIUMEQ   600-50-300 MG tablet TAKE 1 TABLET BY MOUTH DAILY 02/14/23  Yes Luiz Channel, MD  ibuprofen  (ADVIL ) 800 MG tablet Take 1 tablet (800 mg total) by mouth every 8 (eight) hours as needed. Patient not taking: Reported on 04/09/2023 10/31/22   Kinsinger, Herlene Righter, MD  ondansetron  (ZOFRAN -ODT) 4 MG disintegrating tablet Take 1  tablet (4 mg total) by mouth every 6 (six) hours as needed for up to 20 doses for nausea or vomiting (Place under tongue). Patient not taking: Reported on 01/26/2023 10/31/22   Kinsinger, Herlene Righter, MD  oxyCODONE  (OXY IR/ROXICODONE ) 5 MG immediate release tablet Take 1 tablet (5 mg total) by mouth every 6 (six) hours as needed for severe pain. Patient not taking: Reported on 01/26/2023 10/31/22   Kinsinger, Herlene Righter, MD  pantoprazole  (PROTONIX ) 40 MG tablet Take 1 tablet (40 mg total) by mouth daily. Patient not taking: Reported on 01/26/2023 12/15/21   Luiz Channel, MD  polyethylene glycol powder (GLYCOLAX /MIRALAX ) 17 GM/SCOOP powder Take 17 g by mouth once. Patient not taking: Reported on 01/26/2023 09/13/22   [provider]  tamsulosin  (FLOMAX ) 0.4 MG CAPS capsule Take 1 capsule (0.4 mg total) by mouth daily after supper. Patient not taking: Reported on 04/09/2023 01/26/23   Luiz Channel, MD    Current Facility-Administered Medications  Medication Dose Route Frequency Provider Last Rate Last Admin   acetaminophen  (TYLENOL ) tablet 650 mg  650 mg Oral Q6H PRN Celinda Alm Lot, MD       Or   acetaminophen  (TYLENOL ) suppository 650 mg  650 mg Rectal Q6H PRN Celinda Alm Lot, MD       dextrose  5 % and 0.45 % NaCl with KCl 20 mEq/L infusion   Intravenous Continuous Celinda Alm Lot, MD 125 mL/hr at 04/10/23 0603 New Bag at 04/10/23 0603   enalaprilat  (VASOTEC) injection 1.25 mg  1.25 mg Intravenous Q6H Celinda Alm Lot, MD   1.25 mg at 04/10/23 9251   enoxaparin  (LOVENOX ) injection 55 mg  55 mg Subcutaneous Q24H Celinda Alm Lot, MD   55 mg at 04/09/23 2150   HYDROmorphone  (DILAUDID ) injection 1 mg  1 mg Intravenous Q4H PRN Celinda Alm Lot, MD       metoprolol  tartrate (LOPRESSOR ) injection 5 mg  5 mg Intravenous Q8H Celinda Alm Lot, MD   5 mg at 04/10/23 9444   ondansetron  (ZOFRAN ) tablet 4 mg  4 mg Oral Q6H PRN Celinda Alm Lot, MD       Or   ondansetron   (ZOFRAN ) injection 4 mg  4 mg Intravenous Q6H PRN Celinda Alm Lot, MD       pantoprazole  (PROTONIX ) injection 40 mg  40 mg Intravenous Q24H Celinda Alm Lot, MD   40 mg at 04/09/23 1830   potassium chloride  10 mEq in 100 mL IVPB  10 mEq Intravenous Q1 Hr x 6 Pokhrel, Laxman, MD       prochlorperazine  (COMPAZINE ) injection 10 mg  10 mg Intravenous Q6H PRN Celinda Alm Lot, MD        Allergies as of 04/09/2023   (No Known Allergies)    Family History  Problem Relation Age of Onset   Hypertension Mother    Diabetes Mother     Social History   Socioeconomic History   Marital status: Single    Spouse name: Not on file   Number of children: Not on file   Years of education: Not on file   Highest education level: Not on file  Occupational History  Not on file  Tobacco Use   Smoking status: Former    Current packs/day: 0.25    Average packs/day: 0.3 packs/day for 15.0 years (3.8 ttl pk-yrs)    Types: Cigarettes   Smokeless tobacco: Never   Tobacco comments:    Occ    Quit about a month ago in 02/2022  Vaping Use   Vaping status: Never Used  Substance and Sexual Activity   Alcohol use: Not Currently    Alcohol/week: 50.0 standard drinks of alcohol    Types: 50 Cans of beer per week   Drug use: Not Currently    Types: Marijuana, Cocaine   Sexual activity: Not Currently    Partners: Female    Birth control/protection: Condom    Comment: declined condoms  Other Topics Concern   Not on file  Social History Narrative   Not on file   Social Drivers of Health   Financial Resource Strain: Not on file  Food Insecurity: No Food Insecurity (04/09/2023)   Hunger Vital Sign    Worried About Running Out of Food in the Last Year: Never true    Ran Out of Food in the Last Year: Never true  Transportation Needs: No Transportation Needs (04/09/2023)   PRAPARE - Administrator, Civil Service (Medical): No    Lack of Transportation (Non-Medical): No  Physical  Activity: Not on file  Stress: Not on file  Social Connections: Not on file  Intimate Partner Violence: Not At Risk (04/09/2023)   Humiliation, Afraid, Rape, and Kick questionnaire    Fear of Current or Ex-Partner: No    Emotionally Abused: No    Physically Abused: No    Sexually Abused: No    Review of Systems: As per HPI, all others negative  Physical Exam: Vital signs in last 24 hours: Temp:  [97.6 F (36.4 C)-98.4 F (36.9 C)] 98.2 F (36.8 C) (01/05 0610) Pulse Rate:  [69-82] 73 (01/05 0610) Resp:  [16-28] 18 (01/05 0610) BP: (142-182)/(106-123) 142/113 (01/05 0610) SpO2:  [94 %-100 %] 94 % (01/05 0610) Weight:  [109.9 kg] 109.9 kg (01/04 1807)   General:   Alert,  Well-developed, well-nourished, pleasant and cooperative, in no acute distress Head:  Normocephalic and atraumatic. Eyes:  Sclera clear, no icterus.   Conjunctiva pink. Ears:  Normal auditory acuity. Nose:  No deformity, discharge,  or lesions. Mouth:  No deformity or lesions.  Oropharynx pink & moist. Neck:  Supple; no masses or thyromegaly. Lungs:  No visible distress Abdomen:  Markedly distended and tympanic; no abdominal pain or peritonitis.  Msk:  Symmetrical without gross deformities. Normal posture. Pulses:  Normal pulses noted. Extremities:  Without clubbing or edema. Neurologic:  Alert and  oriented x4;  grossly normal neurologically. Skin:  Intact without significant lesions or rashes. Psych:  Alert and cooperative. Normal mood and affect.   Lab Results: Recent Labs    04/09/23 0846  WBC 6.2  HGB 15.0  HCT 44.3  PLT 226   BMET Recent Labs    04/09/23 0846 04/10/23 0959  NA 138 136  K 2.7* 2.6*  CL 102 101  CO2 27 27  GLUCOSE 130* 137*  BUN 14 9  CREATININE 0.72 0.62  CALCIUM  9.2 8.5*   LFT Recent Labs    04/09/23 0846  PROT 8.0  ALBUMIN  4.0  AST 15  ALT 11  ALKPHOS 74  BILITOT 0.5   PT/INR No results for input(s): LABPROT, INR in the last 72  hours.  Studies/Results: DG Abdomen 1 View Result Date: 04/09/2023 CLINICAL DATA:  Post NG tube insertion EXAM: ABDOMEN - 1 VIEW COMPARISON:  CT abdomen pelvis 04/09/2023 FINDINGS: Enteric tube courses below the hemidiaphragm with tip and side port overlying the expected region the gastric lumen. Gaseous dilatation of several loops of large bowel. IVC filter noted. No radio-opaque calculi or other significant radiographic abnormality are seen. IMPRESSION: 1. Enteric tube in good position. 2. Findings suggestive of large bowel obstruction. Electronically Signed   By: Morgane  Naveau M.D.   On: 04/09/2023 14:10   CT ABDOMEN PELVIS W CONTRAST Result Date: 04/09/2023 CLINICAL DATA:  Acute abdominal pain.  Previous colostomy takedown. EXAM: CT ABDOMEN AND PELVIS WITH CONTRAST TECHNIQUE: Multidetector CT imaging of the abdomen and pelvis was performed using the standard protocol following bolus administration of intravenous contrast. RADIATION DOSE REDUCTION: This exam was performed according to the departmental dose-optimization program which includes automated exposure control, adjustment of the mA and/or kV according to patient size and/or use of iterative reconstruction technique. CONTRAST:  OMNIPAQUE  IOHEXOL  300 MG/ML  SOLN COMPARISON:  CT 11/26/2021. FINDINGS: Lower chest: Bandlike opacities along the lung bases. Atelectasis is favored. Some breathing motion. Mild ground-glass. Hepatobiliary: Preserved hepatic parenchyma. Patent portal vein. Gallbladder is nondilated. Pancreas: Unremarkable. No pancreatic ductal dilatation or surrounding inflammatory changes. Spleen: Normal in size without focal abnormality. Adrenals/Urinary Tract: Adrenal glands are unremarkable. Kidneys are normal, without renal calculi, focal lesion, or hydronephrosis. Bladder is unremarkable. Stomach/Bowel: Surgical changes identified along the distal descending, sigmoid colon with a primary anastomosis. There is thickening along the  adjacent anterior abdominal wall in the area of previous ostomy. There is a nodular soft tissue thickening in this location along the bowel with the proximal dilatation of the colon measuring up to 14.6 cm. Air-fluid levels along the proximal colon. A developing high-grade obstruction is a concern. Please correlate clinical findings. The small bowel and stomach are nondilated. Slight thickening in the area of the rectum and anal region, nonspecific. Vascular/Lymphatic: Normal caliber aorta and IVC. There is a IVC filter in place. Are some small mesenteric nodes identified including immediately adjacent to the area of the transition point in the left hemipelvis with a node measuring 14 by 8 mm on series 2, image 70. Reproductive: Prostate is unremarkable. Other: No free air, free fluid. Musculoskeletal: Mild degenerative changes of the spine and pelvis. Critical Value/emergent results were called by telephone at the time of interpretation on 04/09/2023 at 10:00 amPST to provider [facility] , who verbally acknowledged these results. IMPRESSION: Severely dilated colon with a transition point in the left hemipelvis just above the area of the suture line for the colostomy takedown and anastomosis. The tissue in this location is nodular and thickened. Please correlate for etiology of the developing potential high-grade obstruction. Neoplasm in principle is not excluded. No pneumatosis or free air. Few prominent mesenteric lymph nodes. IVC filter. Electronically Signed   By: Ranell Bring M.D.   On: 04/09/2023 13:02    Impression:   Progressive abdominal distention with evidence of large bowel partial obstruction at/near level of colostomy take-down.   Plan:   Tap water  enemas to try to clear out distal colon tomorrow, in preparation for sigmoidoscopy some time tomorrow. NPO for now.  IV fluids, supportive care, repletion of potassium. Potassium will need to be at least 3 for us  to do sigmoidoscopy  tomorrow. Risks (bleeding, infection, bowel perforation that could require surgery, sedation-related changes in cardiopulmonary systems), benefits (identification and possible  treatment of source of symptoms, exclusion of certain causes of symptoms), and alternatives (watchful waiting, radiographic imaging studies, empiric medical treatment) of sigmoidoscopy were explained to patient/family in detail and patient wishes to proceed.  Suspect patient will ultimately require surgery this admission. [facility] will follow.   LOS: 1 day   Temple Sporer M  04/10/2023, 11:40 AM  Cell [phone] If no answer or after 5 PM call [phone]

## 2023-04-10 NOTE — Progress Notes (Signed)
 Subjective/Chief Complaint: Reports distension but minimal pain No flatus or BM   Objective: Vital signs in last 24 hours: Temp:  [97.6 F (36.4 C)-98.4 F (36.9 C)] 98.2 F (36.8 C) (01/05 0610) Pulse Rate:  [69-82] 73 (01/05 0610) Resp:  [16-28] 18 (01/05 0610) BP: (142-182)/(106-123) 142/113 (01/05 0610) SpO2:  [94 %-100 %] 94 % (01/05 0610) Weight:  [109.9 kg-111.1 kg] 109.9 kg (01/04 1807)    Intake/Output from previous day: 01/04 0701 - 01/05 0700 In: 2538.3 [I.V.:1989.9; NG/GT:30; IV Piggyback:518.4] Out: 750 [Urine:750] Intake/Output this shift: No intake/output data recorded.  Exam: Awake and alert Looks comfortable Abdomen distended, non-tender  Lab Results:  Recent Labs    04/09/23 0846  WBC 6.2  HGB 15.0  HCT 44.3  PLT 226   BMET Recent Labs    04/09/23 0846  NA 138  K 2.7*  CL 102  CO2 27  GLUCOSE 130*  BUN 14  CREATININE 0.72  CALCIUM  9.2   PT/INR No results for input(s): LABPROT, INR in the last 72 hours. ABG No results for input(s): PHART, HCO3 in the last 72 hours.  Invalid input(s): PCO2, PO2  Studies/Results: DG Abdomen 1 View Result Date: 04/09/2023 CLINICAL DATA:  Post NG tube insertion EXAM: ABDOMEN - 1 VIEW COMPARISON:  CT abdomen pelvis 04/09/2023 FINDINGS: Enteric tube courses below the hemidiaphragm with tip and side port overlying the expected region the gastric lumen. Gaseous dilatation of several loops of large bowel. IVC filter noted. No radio-opaque calculi or other significant radiographic abnormality are seen. IMPRESSION: 1. Enteric tube in good position. 2. Findings suggestive of large bowel obstruction. Electronically Signed   By: Morgane  Naveau M.D.   On: 04/09/2023 14:10   CT ABDOMEN PELVIS W CONTRAST Result Date: 04/09/2023 CLINICAL DATA:  Acute abdominal pain.  Previous colostomy takedown. EXAM: CT ABDOMEN AND PELVIS WITH CONTRAST TECHNIQUE: Multidetector CT imaging of the abdomen and pelvis was  performed using the standard protocol following bolus administration of intravenous contrast. RADIATION DOSE REDUCTION: This exam was performed according to the departmental dose-optimization program which includes automated exposure control, adjustment of the mA and/or kV according to patient size and/or use of iterative reconstruction technique. CONTRAST:  OMNIPAQUE  IOHEXOL  300 MG/ML  SOLN COMPARISON:  CT 11/26/2021. FINDINGS: Lower chest: Bandlike opacities along the lung bases. Atelectasis is favored. Some breathing motion. Mild ground-glass. Hepatobiliary: Preserved hepatic parenchyma. Patent portal vein. Gallbladder is nondilated. Pancreas: Unremarkable. No pancreatic ductal dilatation or surrounding inflammatory changes. Spleen: Normal in size without focal abnormality. Adrenals/Urinary Tract: Adrenal glands are unremarkable. Kidneys are normal, without renal calculi, focal lesion, or hydronephrosis. Bladder is unremarkable. Stomach/Bowel: Surgical changes identified along the distal descending, sigmoid colon with a primary anastomosis. There is thickening along the adjacent anterior abdominal wall in the area of previous ostomy. There is a nodular soft tissue thickening in this location along the bowel with the proximal dilatation of the colon measuring up to 14.6 cm. Air-fluid levels along the proximal colon. A developing high-grade obstruction is a concern. Please correlate clinical findings. The small bowel and stomach are nondilated. Slight thickening in the area of the rectum and anal region, nonspecific. Vascular/Lymphatic: Normal caliber aorta and IVC. There is a IVC filter in place. Are some small mesenteric nodes identified including immediately adjacent to the area of the transition point in the left hemipelvis with a node measuring 14 by 8 mm on series 2, image 70. Reproductive: Prostate is unremarkable. Other: No free air, free fluid. Musculoskeletal: Mild  degenerative changes of the spine  and pelvis. Critical Value/emergent results were called by telephone at the time of interpretation on 04/09/2023 at 10:00 amPST to provider [facility] , who verbally acknowledged these results. IMPRESSION: Severely dilated colon with a transition point in the left hemipelvis just above the area of the suture line for the colostomy takedown and anastomosis. The tissue in this location is nodular and thickened. Please correlate for etiology of the developing potential high-grade obstruction. Neoplasm in principle is not excluded. No pneumatosis or free air. Few prominent mesenteric lymph nodes. IVC filter. Electronically Signed   By: Ranell Bring M.D.   On: 04/09/2023 13:02    Anti-infectives: Anti-infectives (From admission, onward)    None       Assessment/Plan: Large Bowel obstruction  I had Dr. Sheldon review the CT scan.  He feels like the anastomosis is patient and the obstruction could be just proximal to this but not totally sure. His last endoscopy was 2022  A GI consult is recommend to see if they will consider a flex sig to evaluate the anastomosis and look for possible malignancy given his HIV.  He still may very well need a laparotomy and another colostomy.  We will notify Dr. Debby of his admission for her opinion as she performed the ostomy takedown in July   LOS: 1 day    Vicenta Poli MD 04/10/2023

## 2023-04-10 NOTE — Progress Notes (Addendum)
 Pt lying in bed with frequent sneezes.Upon a final hard sneeze, NG tube noted to drop out of patient's nare. NG tube has been at gravity, per MD's order, with little to no output. Pt preferred to wait until Am and speak with Provider prior to reinsertion of tube since tube is not on suction with no relief at the moment.

## 2023-04-10 NOTE — Hospital Course (Signed)
 Leon Nichols is a 58 y.o. male with medical history significant of back pain, history of colitis, diverticulosis, DVT, hemorrhoids, herpes zoster, HIV, hypertension, multiple episodes of perianal/perirectal abscess,  diverting loop descending colostomy in 2013 with subsequent reversal of the colostomy in July of this year presented to hospital with abdominal distention associated with nausea and diarrhea.  In the ED patient had elevated blood pressure otherwise vital stable.  Labs showed hypokalemia.  CBC lipase was within normal range urinalysis with proteinuria. CT abdomen/pelvis with contrast showing a severely dilated colon with a transition point in the left hemipelvis just above the area of the suture line at colostomy takedown and anastomosis.  The tissue in this location was nodular and thickened.  General surgery was consulted and patient was out of the hospital for further evaluation and treatment.  Assessment and plan.  Bowel obstruction, colonic obstruction N.p.o. IV fluids NG tube analgesics antiemetics Protonix .  General surgery on board and possibly colonic anastomotic site stricture.  Follow general surgery recommendation.     Hypokalemia Replenished.  BMP from today pending.     HIV disease (HCC) On Triumeq  600-50-300 as outpatient.  Follows up with ID as outpatient.     Hypertension Since patient is n.p.o. continue IV metoprolol , enalapril.       History of pulmonary embolism   History of DVT (deep vein thrombosis) Status post undergone IVC filter placement.     Hyperlipidemia On Lipitor as outpatient.  Currently on hold.     Class 1 obesity BMI 32.32 kg/m.  Would benefit from weight loss as outpatient.

## 2023-04-10 NOTE — H&P (View-Only) (Signed)
 [facility] Consultation Note  Referring Provider: [facility] Primary Care Physician:  Pa, [facility] Primary Gastroenterologist:  Sampson  Reason for Consultation:  abdominal distention, suspected colonic obstruction.  HPI: Leon Nichols is a 58 y.o. male had take-down of colostomy July 2024.  Since that time, he has had slowly progressive abdominal distention and decreased stool and flatus output.  Had MRI to evaluate these symptoms recently showing profound colonic distention and was admitted as a result.  Stool soft and loose over the past few months; last flatus yesterday; last bowel movement 2 days ago.  No blood in stool.  Had colonoscopy and endoscopy for anemia in 2022, colonoscopy via ostomy with fair/poor bowel preparation with recommendation for follow-up repeat colonoscopy later, but patient was lost to follow-up and never came back to our office afterwards.  No vomiting.  CT suspicious large bowel obstruction at level of colostomy takedown.   Past Medical History:  Diagnosis Date   Acute back pain 10/28/2016   Colitis    Diverticulosis    DVT (deep venous thrombosis) (HCC)    Hemorrhoids    History of shingles    HIV (human immunodeficiency virus infection) (HCC) 03/24/2012   Hypertension    Perianal abscess 02/06/2015   Perirectal abscess    Septic arthritis (HCC) 02/05/2015    Past Surgical History:  Procedure Laterality Date   arthroscopic wash out of right knee     COLONOSCOPY  08/31/2011   Procedure: COLONOSCOPY;  Surgeon: Norleen LOISE Kiang, MD;  Location: [facility] ENDOSCOPY;  Service: Endoscopy;  Laterality: N/A;   COLONOSCOPY WITH PROPOFOL  N/A 02/28/2021   Procedure: COLONOSCOPY WITH PROPOFOL ;  Surgeon: Burnette Fallow, MD;  Location: [facility] ENDOSCOPY;  Service: Endoscopy;  Laterality: N/A;   COLOSTOMY CLOSURE N/A 10/27/2022   Procedure: OPEN CLOSURE OF LOOP COLOSTOMY;  Surgeon: Debby Hila, MD;  Location: [facility] ORS;  Service: General;  Laterality: N/A;    DRESSING CHANGE UNDER ANESTHESIA  03/27/2012   Procedure: DRESSING CHANGE UNDER ANESTHESIA;  Surgeon: Sherlean JINNY Laughter, MD;  Location: [facility] OR;  Service: General;  Laterality: N/A;   ESOPHAGOGASTRODUODENOSCOPY (EGD) WITH PROPOFOL  N/A 02/28/2021   Procedure: ESOPHAGOGASTRODUODENOSCOPY (EGD) WITH PROPOFOL ;  Surgeon: Burnette Fallow, MD;  Location: [facility] ENDOSCOPY;  Service: Endoscopy;  Laterality: N/A;   INCISION AND DRAINAGE PERIRECTAL ABSCESS  03/23/2012   Procedure: IRRIGATION AND DEBRIDEMENT PERIRECTAL ABSCESS;  Surgeon: Morene ONEIDA Olives, MD;  Location: [facility] OR;  Service: General;  Laterality: N/A;  I & D soft tissue scrotal and perineal abscess   IR IVC FILTER PLMT / S&I PORTER GUID/MOD SED  02/27/2021   KNEE ARTHROSCOPY Right 02/05/2015   Procedure: ARTHROSCOPIC [facility] OUT RIGHT KNEE;  Surgeon: Evalene JONETTA Chancy, MD;  Location: [facility] OR;  Service: Orthopedics;  Laterality: Right;   LAPAROSCOPIC DIVERTED COLOSTOMY  03/27/2012   Procedure: LAPAROSCOPIC DIVERTED COLOSTOMY;  Surgeon: Sherlean JINNY Laughter, MD;  Location: [facility] OR;  Service: General;  Laterality: N/A;    Prior to Admission medications   Medication Sig Start Date End Date Taking? Authorizing Provider  amLODipine  (NORVASC ) 10 MG tablet TAKE 1 TABLET(10 MG) BY MOUTH DAILY 12/15/21  Yes Luiz Channel, MD  gabapentin  (NEURONTIN ) 100 MG capsule Take 100 mg by mouth at bedtime.   Yes [provider]  lisinopril  (ZESTRIL ) 20 MG tablet TAKE 1 TABLET(20 MG) BY MOUTH DAILY 03/24/23  Yes Luiz Channel, MD  rosuvastatin  (CRESTOR ) 10 MG tablet TAKE 1 TABLET(10 MG) BY MOUTH DAILY 03/24/23  Yes Luiz Channel, MD  TRIUMEQ   600-50-300 MG tablet TAKE 1 TABLET BY MOUTH DAILY 02/14/23  Yes Luiz Channel, MD  ibuprofen  (ADVIL ) 800 MG tablet Take 1 tablet (800 mg total) by mouth every 8 (eight) hours as needed. Patient not taking: Reported on 04/09/2023 10/31/22   Kinsinger, Herlene Righter, MD  ondansetron  (ZOFRAN -ODT) 4 MG disintegrating tablet Take 1  tablet (4 mg total) by mouth every 6 (six) hours as needed for up to 20 doses for nausea or vomiting (Place under tongue). Patient not taking: Reported on 01/26/2023 10/31/22   Kinsinger, Herlene Righter, MD  oxyCODONE  (OXY IR/ROXICODONE ) 5 MG immediate release tablet Take 1 tablet (5 mg total) by mouth every 6 (six) hours as needed for severe pain. Patient not taking: Reported on 01/26/2023 10/31/22   Kinsinger, Herlene Righter, MD  pantoprazole  (PROTONIX ) 40 MG tablet Take 1 tablet (40 mg total) by mouth daily. Patient not taking: Reported on 01/26/2023 12/15/21   Luiz Channel, MD  polyethylene glycol powder (GLYCOLAX /MIRALAX ) 17 GM/SCOOP powder Take 17 g by mouth once. Patient not taking: Reported on 01/26/2023 09/13/22   [provider]  tamsulosin  (FLOMAX ) 0.4 MG CAPS capsule Take 1 capsule (0.4 mg total) by mouth daily after supper. Patient not taking: Reported on 04/09/2023 01/26/23   Luiz Channel, MD    Current Facility-Administered Medications  Medication Dose Route Frequency Provider Last Rate Last Admin   acetaminophen  (TYLENOL ) tablet 650 mg  650 mg Oral Q6H PRN Celinda Alm Lot, MD       Or   acetaminophen  (TYLENOL ) suppository 650 mg  650 mg Rectal Q6H PRN Celinda Alm Lot, MD       dextrose  5 % and 0.45 % NaCl with KCl 20 mEq/L infusion   Intravenous Continuous Celinda Alm Lot, MD 125 mL/hr at 04/10/23 0603 New Bag at 04/10/23 0603   enalaprilat  (VASOTEC) injection 1.25 mg  1.25 mg Intravenous Q6H Celinda Alm Lot, MD   1.25 mg at 04/10/23 9251   enoxaparin  (LOVENOX ) injection 55 mg  55 mg Subcutaneous Q24H Celinda Alm Lot, MD   55 mg at 04/09/23 2150   HYDROmorphone  (DILAUDID ) injection 1 mg  1 mg Intravenous Q4H PRN Celinda Alm Lot, MD       metoprolol  tartrate (LOPRESSOR ) injection 5 mg  5 mg Intravenous Q8H Celinda Alm Lot, MD   5 mg at 04/10/23 9444   ondansetron  (ZOFRAN ) tablet 4 mg  4 mg Oral Q6H PRN Celinda Alm Lot, MD       Or   ondansetron   (ZOFRAN ) injection 4 mg  4 mg Intravenous Q6H PRN Celinda Alm Lot, MD       pantoprazole  (PROTONIX ) injection 40 mg  40 mg Intravenous Q24H Celinda Alm Lot, MD   40 mg at 04/09/23 1830   potassium chloride  10 mEq in 100 mL IVPB  10 mEq Intravenous Q1 Hr x 6 Pokhrel, Laxman, MD       prochlorperazine  (COMPAZINE ) injection 10 mg  10 mg Intravenous Q6H PRN Celinda Alm Lot, MD        Allergies as of 04/09/2023   (No Known Allergies)    Family History  Problem Relation Age of Onset   Hypertension Mother    Diabetes Mother     Social History   Socioeconomic History   Marital status: Single    Spouse name: Not on file   Number of children: Not on file   Years of education: Not on file   Highest education level: Not on file  Occupational History  Not on file  Tobacco Use   Smoking status: Former    Current packs/day: 0.25    Average packs/day: 0.3 packs/day for 15.0 years (3.8 ttl pk-yrs)    Types: Cigarettes   Smokeless tobacco: Never   Tobacco comments:    Occ    Quit about a month ago in 02/2022  Vaping Use   Vaping status: Never Used  Substance and Sexual Activity   Alcohol use: Not Currently    Alcohol/week: 50.0 standard drinks of alcohol    Types: 50 Cans of beer per week   Drug use: Not Currently    Types: Marijuana, Cocaine   Sexual activity: Not Currently    Partners: Female    Birth control/protection: Condom    Comment: declined condoms  Other Topics Concern   Not on file  Social History Narrative   Not on file   Social Drivers of Health   Financial Resource Strain: Not on file  Food Insecurity: No Food Insecurity (04/09/2023)   Hunger Vital Sign    Worried About Running Out of Food in the Last Year: Never true    Ran Out of Food in the Last Year: Never true  Transportation Needs: No Transportation Needs (04/09/2023)   PRAPARE - Administrator, Civil Service (Medical): No    Lack of Transportation (Non-Medical): No  Physical  Activity: Not on file  Stress: Not on file  Social Connections: Not on file  Intimate Partner Violence: Not At Risk (04/09/2023)   Humiliation, Afraid, Rape, and Kick questionnaire    Fear of Current or Ex-Partner: No    Emotionally Abused: No    Physically Abused: No    Sexually Abused: No    Review of Systems: As per HPI, all others negative  Physical Exam: Vital signs in last 24 hours: Temp:  [97.6 F (36.4 C)-98.4 F (36.9 C)] 98.2 F (36.8 C) (01/05 0610) Pulse Rate:  [69-82] 73 (01/05 0610) Resp:  [16-28] 18 (01/05 0610) BP: (142-182)/(106-123) 142/113 (01/05 0610) SpO2:  [94 %-100 %] 94 % (01/05 0610) Weight:  [109.9 kg] 109.9 kg (01/04 1807)   General:   Alert,  Well-developed, well-nourished, pleasant and cooperative, in no acute distress Head:  Normocephalic and atraumatic. Eyes:  Sclera clear, no icterus.   Conjunctiva pink. Ears:  Normal auditory acuity. Nose:  No deformity, discharge,  or lesions. Mouth:  No deformity or lesions.  Oropharynx pink & moist. Neck:  Supple; no masses or thyromegaly. Lungs:  No visible distress Abdomen:  Markedly distended and tympanic; no abdominal pain or peritonitis.  Msk:  Symmetrical without gross deformities. Normal posture. Pulses:  Normal pulses noted. Extremities:  Without clubbing or edema. Neurologic:  Alert and  oriented x4;  grossly normal neurologically. Skin:  Intact without significant lesions or rashes. Psych:  Alert and cooperative. Normal mood and affect.   Lab Results: Recent Labs    04/09/23 0846  WBC 6.2  HGB 15.0  HCT 44.3  PLT 226   BMET Recent Labs    04/09/23 0846 04/10/23 0959  NA 138 136  K 2.7* 2.6*  CL 102 101  CO2 27 27  GLUCOSE 130* 137*  BUN 14 9  CREATININE 0.72 0.62  CALCIUM  9.2 8.5*   LFT Recent Labs    04/09/23 0846  PROT 8.0  ALBUMIN  4.0  AST 15  ALT 11  ALKPHOS 74  BILITOT 0.5   PT/INR No results for input(s): LABPROT, INR in the last 72  hours.  Studies/Results: DG Abdomen 1 View Result Date: 04/09/2023 CLINICAL DATA:  Post NG tube insertion EXAM: ABDOMEN - 1 VIEW COMPARISON:  CT abdomen pelvis 04/09/2023 FINDINGS: Enteric tube courses below the hemidiaphragm with tip and side port overlying the expected region the gastric lumen. Gaseous dilatation of several loops of large bowel. IVC filter noted. No radio-opaque calculi or other significant radiographic abnormality are seen. IMPRESSION: 1. Enteric tube in good position. 2. Findings suggestive of large bowel obstruction. Electronically Signed   By: Morgane  Naveau M.D.   On: 04/09/2023 14:10   CT ABDOMEN PELVIS W CONTRAST Result Date: 04/09/2023 CLINICAL DATA:  Acute abdominal pain.  Previous colostomy takedown. EXAM: CT ABDOMEN AND PELVIS WITH CONTRAST TECHNIQUE: Multidetector CT imaging of the abdomen and pelvis was performed using the standard protocol following bolus administration of intravenous contrast. RADIATION DOSE REDUCTION: This exam was performed according to the departmental dose-optimization program which includes automated exposure control, adjustment of the mA and/or kV according to patient size and/or use of iterative reconstruction technique. CONTRAST:  OMNIPAQUE  IOHEXOL  300 MG/ML  SOLN COMPARISON:  CT 11/26/2021. FINDINGS: Lower chest: Bandlike opacities along the lung bases. Atelectasis is favored. Some breathing motion. Mild ground-glass. Hepatobiliary: Preserved hepatic parenchyma. Patent portal vein. Gallbladder is nondilated. Pancreas: Unremarkable. No pancreatic ductal dilatation or surrounding inflammatory changes. Spleen: Normal in size without focal abnormality. Adrenals/Urinary Tract: Adrenal glands are unremarkable. Kidneys are normal, without renal calculi, focal lesion, or hydronephrosis. Bladder is unremarkable. Stomach/Bowel: Surgical changes identified along the distal descending, sigmoid colon with a primary anastomosis. There is thickening along the  adjacent anterior abdominal wall in the area of previous ostomy. There is a nodular soft tissue thickening in this location along the bowel with the proximal dilatation of the colon measuring up to 14.6 cm. Air-fluid levels along the proximal colon. A developing high-grade obstruction is a concern. Please correlate clinical findings. The small bowel and stomach are nondilated. Slight thickening in the area of the rectum and anal region, nonspecific. Vascular/Lymphatic: Normal caliber aorta and IVC. There is a IVC filter in place. Are some small mesenteric nodes identified including immediately adjacent to the area of the transition point in the left hemipelvis with a node measuring 14 by 8 mm on series 2, image 70. Reproductive: Prostate is unremarkable. Other: No free air, free fluid. Musculoskeletal: Mild degenerative changes of the spine and pelvis. Critical Value/emergent results were called by telephone at the time of interpretation on 04/09/2023 at 10:00 amPST to provider [facility] , who verbally acknowledged these results. IMPRESSION: Severely dilated colon with a transition point in the left hemipelvis just above the area of the suture line for the colostomy takedown and anastomosis. The tissue in this location is nodular and thickened. Please correlate for etiology of the developing potential high-grade obstruction. Neoplasm in principle is not excluded. No pneumatosis or free air. Few prominent mesenteric lymph nodes. IVC filter. Electronically Signed   By: Ranell Bring M.D.   On: 04/09/2023 13:02    Impression:   Progressive abdominal distention with evidence of large bowel partial obstruction at/near level of colostomy take-down.   Plan:   Tap water  enemas to try to clear out distal colon tomorrow, in preparation for sigmoidoscopy some time tomorrow. NPO for now.  IV fluids, supportive care, repletion of potassium. Potassium will need to be at least 3 for us  to do sigmoidoscopy  tomorrow. Risks (bleeding, infection, bowel perforation that could require surgery, sedation-related changes in cardiopulmonary systems), benefits (identification and possible  treatment of source of symptoms, exclusion of certain causes of symptoms), and alternatives (watchful waiting, radiographic imaging studies, empiric medical treatment) of sigmoidoscopy were explained to patient/family in detail and patient wishes to proceed.  Suspect patient will ultimately require surgery this admission. [facility] will follow.   LOS: 1 day   Temple Sporer M  04/10/2023, 11:40 AM  Cell [phone] If no answer or after 5 PM call [phone]

## 2023-04-11 ENCOUNTER — Inpatient Hospital Stay (HOSPITAL_COMMUNITY): Payer: 59 | Admitting: Certified Registered"

## 2023-04-11 ENCOUNTER — Encounter (HOSPITAL_COMMUNITY): Payer: Self-pay

## 2023-04-11 ENCOUNTER — Encounter (HOSPITAL_COMMUNITY)
Admission: EM | Disposition: A | Discharge status: Home / Self Care | Payer: Self-pay | Source: Home / Self Care | Attending: Internal Medicine

## 2023-04-11 ENCOUNTER — Inpatient Hospital Stay (HOSPITAL_COMMUNITY): Payer: 59

## 2023-04-11 DIAGNOSIS — Z87891 Personal history of nicotine dependence: Secondary | ICD-10-CM

## 2023-04-11 DIAGNOSIS — B20 Human immunodeficiency virus [HIV] disease: Secondary | ICD-10-CM | POA: Diagnosis not present

## 2023-04-11 DIAGNOSIS — Z86718 Personal history of other venous thrombosis and embolism: Secondary | ICD-10-CM

## 2023-04-11 DIAGNOSIS — I10 Essential (primary) hypertension: Secondary | ICD-10-CM

## 2023-04-11 DIAGNOSIS — Z86711 Personal history of pulmonary embolism: Secondary | ICD-10-CM | POA: Diagnosis not present

## 2023-04-11 DIAGNOSIS — K56609 Unspecified intestinal obstruction, unspecified as to partial versus complete obstruction: Secondary | ICD-10-CM

## 2023-04-11 DIAGNOSIS — E876 Hypokalemia: Secondary | ICD-10-CM

## 2023-04-11 HISTORY — PX: FLEXIBLE SIGMOIDOSCOPY: SHX5431

## 2023-04-11 HISTORY — PX: BIOPSY: SHX5522

## 2023-04-11 LAB — CBC
HCT: 44.8 % (ref 39.0–52.0)
Hemoglobin: 15.1 g/dL (ref 13.0–17.0)
MCH: 31.1 pg (ref 26.0–34.0)
MCHC: 33.7 g/dL (ref 30.0–36.0)
MCV: 92.2 fL (ref 80.0–100.0)
Platelets: 239 10*3/uL (ref 150–400)
RBC: 4.86 MIL/uL (ref 4.22–5.81)
RDW: 13.3 % (ref 11.5–15.5)
WBC: 5.6 10*3/uL (ref 4.0–10.5)
nRBC: 0 % (ref 0.0–0.2)

## 2023-04-11 LAB — BASIC METABOLIC PANEL
Anion gap: 9 (ref 5–15)
BUN: 9 mg/dL (ref 6–20)
CO2: 24 mmol/L (ref 22–32)
Calcium: 9.3 mg/dL (ref 8.9–10.3)
Chloride: 104 mmol/L (ref 98–111)
Creatinine, Ser: 0.71 mg/dL (ref 0.61–1.24)
GFR, Estimated: >60 mL/min (ref >60)
Glucose, Bld: 141 mg/dL — ABNORMAL HIGH (ref 70–99)
Potassium: 3.1 mmol/L — ABNORMAL LOW (ref 3.5–5.1)
Sodium: 137 mmol/L (ref 135–145)

## 2023-04-11 LAB — MAGNESIUM: Magnesium: 1.8 mg/dL (ref 1.7–2.4)

## 2023-04-11 SURGERY — SIGMOIDOSCOPY, FLEXIBLE
Anesthesia: Monitor Anesthesia Care | Laterality: Left

## 2023-04-11 MED ORDER — HYDRALAZINE HCL 20 MG/ML IJ SOLN: 10.0000 mg | INTRAMUSCULAR | Status: DC | PRN

## 2023-04-11 MED ORDER — POTASSIUM CHLORIDE 10 MEQ/100ML IV SOLN: 10.0000 meq | INTRAVENOUS | Status: DC

## 2023-04-11 MED ORDER — PROPOFOL 10 MG/ML IV BOLUS
INTRAVENOUS | Status: DC | PRN
  Administered 2023-04-11: 10 mg via INTRAVENOUS

## 2023-04-11 MED ORDER — LIDOCAINE 2% (20 MG/ML) 5 ML SYRINGE
INTRAMUSCULAR | Status: DC | PRN
  Administered 2023-04-11: 80 mg via INTRAVENOUS

## 2023-04-11 MED ORDER — BOOST / RESOURCE BREEZE PO LIQD CUSTOM
1.0000 | Freq: Three times a day (TID) | ORAL | Status: DC
  Administered 2023-04-12 (×3): 1 via ORAL

## 2023-04-11 MED ORDER — PROPOFOL 500 MG/50ML IV EMUL
INTRAVENOUS | Status: AC
  Filled 2023-04-11: qty 50

## 2023-04-11 MED ORDER — PROPOFOL 500 MG/50ML IV EMUL
INTRAVENOUS | Status: DC | PRN
  Administered 2023-04-11: 150 ug/kg/min via INTRAVENOUS

## 2023-04-11 MED ORDER — POTASSIUM CHLORIDE 10 MEQ/100ML IV SOLN
10.0000 meq | INTRAVENOUS | Status: DC
  Administered 2023-04-11 (×4): 10 meq via INTRAVENOUS
  Filled 2023-04-11 (×4): qty 100

## 2023-04-11 MED ORDER — SODIUM CHLORIDE 0.9 % IV SOLN: INTRAVENOUS | Status: DC | PRN

## 2023-04-11 NOTE — Anesthesia Preprocedure Evaluation (Signed)
 Anesthesia Evaluation  Patient identified by MRN, date of birth, ID band Patient awake    Reviewed: Allergy & Precautions, NPO status , Patient's Chart, lab work & pertinent test results  Airway Mallampati: III       Dental no notable dental hx.    Pulmonary former smoker, PE   Pulmonary exam normal        Cardiovascular hypertension, Pt. on medications + DVT  Normal cardiovascular exam     Neuro/Psych  Headaches  negative psych ROS   GI/Hepatic negative GI ROS,,,(+)     substance abuse    Endo/Other  negative endocrine ROS    Renal/GU negative Renal ROS     Musculoskeletal negative musculoskeletal ROS (+)    Abdominal  (+) + obese  Peds  Hematology  (+) HIV  Anesthesia Other Findings distal colonic obstruction abnormal CT  Reproductive/Obstetrics                             Anesthesia Physical Anesthesia Plan  ASA: 3  Anesthesia Plan: MAC   Post-op Pain Management:    Induction: Intravenous  PONV Risk Score and Plan: 1 and Propofol  infusion and Treatment may vary due to age or medical condition  Airway Management Planned: Simple Face Mask  Additional Equipment:   Intra-op Plan:   Post-operative Plan:   Informed Consent: I have reviewed the patients History and Physical, chart, labs and discussed the procedure including the risks, benefits and alternatives for the proposed anesthesia with the patient or authorized representative who has indicated his/her understanding and acceptance.     Dental advisory given  Plan Discussed with: CRNA  Anesthesia Plan Comments: (Potential general endotracheal anesthesia discussed. )       Anesthesia Quick Evaluation

## 2023-04-11 NOTE — Transfer of Care (Signed)
 Immediate Anesthesia Transfer of Care Note  Patient: Leon Nichols  Procedure(s) Performed: FLEXIBLE SIGMOIDOSCOPY (Left) BIOPSY  Patient Location: PACU and Endoscopy Unit  Anesthesia Type:MAC  Level of Consciousness: drowsy and responds to stimulation  Airway & Oxygen Therapy: Patient Spontanous Breathing and Patient connected to face mask oxygen  Post-op Assessment: Report given to RN and Post -op Vital signs reviewed and stable  Post vital signs: Reviewed and stable  Last Vitals:  Vitals Value Taken Time  BP 103/53   Temp    Pulse 76   Resp 16   SpO2 98     Last Pain:  Vitals:   04/11/23 0959  TempSrc: Temporal  PainSc: 0-No pain         Complications: No notable events documented.

## 2023-04-11 NOTE — Progress Notes (Signed)
*   Day of Surgery *   Subjective/Chief Complaint: Denies abdominal pain Reports some SOB Reports passing flatus  Objective: Vital signs in last 24 hours: Temp:  [96.5 F (35.8 C)-97.9 F (36.6 C)] 97.4 F (36.3 C) (01/06 1135) Pulse Rate:  [72-78] 77 (01/06 1150) Resp:  [13-20] 18 (01/06 1150) BP: (103-183)/(73-151) 117/90 (01/06 1150) SpO2:  [92 %-98 %] 94 % (01/06 1150) Last BM Date : 04/11/23 (With enema)  Intake/Output from previous day: 01/05 0701 - 01/06 0700 In: 0  Out: 650 [Urine:650] Intake/Output this shift: Total I/O In: 328.2 [I.V.:328.2] Out: -   Exam: Awake and alert No resp distress Abdomen very distended, non-tender  Lab Results:  Recent Labs    04/09/23 0846 04/11/23 0545  WBC 6.2 5.6  HGB 15.0 15.1  HCT 44.3 44.8  PLT 226 239   BMET Recent Labs    04/10/23 0959 04/11/23 0545  NA 136 137  K 2.6* 3.1*  CL 101 104  CO2 27 24  GLUCOSE 137* 141*  BUN 9 9  CREATININE 0.62 0.71  CALCIUM  8.5* 9.3   PT/INR No results for input(s): LABPROT, INR in the last 72 hours. ABG No results for input(s): PHART, HCO3 in the last 72 hours.  Invalid input(s): PCO2, PO2  Studies/Results: DG Abdomen 1 View Result Date: 04/09/2023 CLINICAL DATA:  Post NG tube insertion EXAM: ABDOMEN - 1 VIEW COMPARISON:  CT abdomen pelvis 04/09/2023 FINDINGS: Enteric tube courses below the hemidiaphragm with tip and side port overlying the expected region the gastric lumen. Gaseous dilatation of several loops of large bowel. IVC filter noted. No radio-opaque calculi or other significant radiographic abnormality are seen. IMPRESSION: 1. Enteric tube in good position. 2. Findings suggestive of large bowel obstruction. Electronically Signed   By: Morgane  Naveau M.D.   On: 04/09/2023 14:10    Anti-infectives: Anti-infectives (From admission, onward)    None       Assessment/Plan: Large bowel obstruction  I discussed the flex sig findings with Dr. Burnette.   I explained the findings to the patient.  Etiology remains uncertain whether this is an anastomotic stricture, ischemic stricture, or a malignancy.  Biopsy results are pending.  I also ordered a CEA level. Regardless of the pathology, the patient will need an colon resection of this area and an end colostomy as he will eventually be totally obstructed and likely perforate his colon. He wants to try liquids today. Surgery likely this week.  Complex medical decision making   LOS: 2 days    Vicenta Poli 04/11/2023

## 2023-04-11 NOTE — Interval H&P Note (Signed)
 History and Physical Interval Note:  04/11/2023 10:53 AM  Leon Nichols  has presented today for surgery, with the diagnosis of distal colonic obsruction, abnormal CT.  The various methods of treatment have been discussed with the patient and family. After consideration of risks, benefits and other options for treatment, the patient has consented to  Procedure(s): FLEXIBLE SIGMOIDOSCOPY (Left) as a surgical intervention.  The patient's history has been reviewed, patient examined, no change in status, stable for surgery.  I have reviewed the patient's chart and labs.  Questions were answered to the patient's satisfaction.     [name]

## 2023-04-11 NOTE — Op Note (Signed)
 [facility] Patient Name: Leon Nichols Procedure Date: 04/11/2023 MRN: [id number] Attending MD: Elsie Cree , MD, [id number] Date of Birth: [birth year] CSN: [id number] Age: 58 Admit Type: Inpatient Procedure:                Flexible Sigmoidoscopy Indications:              Abnormal CT of the GI tract, abdominal distention Providers:                Elsie Cree, MD, Particia Fischer, RN, Hoy Penner, RN, Fairy Marina, Technician Referring MD:             [facility] Medicines:                Monitored Anesthesia Care Complications:            No immediate complications. Estimated Blood Loss:     Estimated blood loss: none. Procedure:                Pre-Anesthesia Assessment:                           - Prior to the procedure, a History and Physical                            was performed, and patient medications and                            allergies were reviewed. The patient's tolerance of                            previous anesthesia was also reviewed. The risks                            and benefits of the procedure and the sedation                            options and risks were discussed with the patient.                            All questions were answered, and informed consent                            was obtained. Prior Anticoagulants: The patient has                            taken no anticoagulant or antiplatelet agents. ASA                            Grade Assessment: III - A patient with severe                            systemic disease. After reviewing the risks and  benefits, the patient was deemed in satisfactory                            condition to undergo the procedure.                           After obtaining informed consent, the scope was                            passed under direct vision. The PCF-HQ190L                            ([id number]) Olympus colonoscope was  introduced                            through the anus and advanced to the the sigmoid                            colon. The flexible sigmoidoscopy was accomplished                            without difficulty. The patient tolerated the                            procedure well. The quality of the bowel                            preparation was fair. Scope In: Scope Out: Findings:      Hemorrhoids were found on perianal exam.      A moderate amount of stool was found in the recto-sigmoid colon.      A patchy area of moderately congested, erythematous, friable (with       contact bleeding), inflamed and nodular mucosa was found in the sigmoid       colon. Biopsies were taken with a cold forceps for histology.      A severe stenosis measuring 3 mm (inner diameter) was found in the       sigmoid colon and was non-traversed. Unclear if this is malignant       stenosis or related to his colostomy take-down.      Diminutive rectal vault, unable to retroflex; no obvious anorectal       lesions seen upon slow withdrawal of colonoscope in prograde views. Impression:               - Preparation of the colon was fair.                           - Hemorrhoids found on perianal exam.                           - Stool in the recto-sigmoid colon, precluding                            views.                           -  Congested, erythematous, friable (with contact                            bleeding), inflamed and nodular mucosa in the                            sigmoid colon. Biopsied.                           - Stricture in the sigmoid colon. Moderate Sedation:      None Recommendation:           - Return patient to hospital ward for ongoing care.                           - NPO until further notice.                           - Continue present medications.                           - Await pathology results.                           - Patient will highly likely need surgery for his                             colonic obstruction at sigmoid colon, regardless of                            pathology results from today's procedure.                           - Surgical team identifed of today's sigmoidoscopy                            findings.                           [facility] will follow. Procedure Code(s):        --- Professional ---                           [phone], Sigmoidoscopy, flexible; with biopsy, single                            or multiple Diagnosis Code(s):        --- Professional ---                           K64.9, Unspecified hemorrhoids                           K92.2, Gastrointestinal hemorrhage, unspecified                           K52.9, Noninfective gastroenteritis and colitis,  unspecified                           K63.89, Other specified diseases of intestine                           K56.699, Other intestinal obstruction unspecified                            as to partial versus complete obstruction                           R93.3, Abnormal findings on diagnostic imaging of                            other parts of digestive tract CPT copyright 2022 [employer]. All rights reserved. The codes documented in this report are preliminary and upon coder review may  be revised to meet current compliance requirements. Elsie Cree, MD 04/11/2023 11:38:21 AM This report has been signed electronically. Number of Addenda: 0

## 2023-04-11 NOTE — Progress Notes (Signed)
 [facility]  PROGRESS NOTE  COLLAN SCHOENFELD FMW:[id number] DOB: [birth year] DOA: 04/09/2023 PCP: [facility]   Brief HPI:   58 y.o. male with medical history significant of back pain, history of colitis, diverticulosis, DVT, hemorrhoids, herpes zoster, HIV, hypertension, multiple episodes of perianal/perirectal abscess,  diverting loop descending colostomy in 2013 with subsequent reversal of the colostomy in July of this year presented to hospital with abdominal distention associated with nausea and diarrhea.  In the ED patient had elevated blood pressure otherwise vital stable.  Labs showed hypokalemia.  CBC lipase was within normal range urinalysis with proteinuria. CT abdomen/pelvis with contrast showing a severely dilated colon with a transition point in the left hemipelvis just above the area of the suture line at colostomy takedown and anastomosis.  The tissue in this location was nodular and thickened.  General surgery was consulted and patient was out of the hospital for further evaluation and treatment.     Assessment/Plan:   Bowel obstruction secondary to colonic obstruction -Presented with abdominal distention associated with nausea and diarrhea -CT abdomen/pelvis showed dilated colon with transition point in the left renal pelvis just above the area of the suture line at colostomy takedown and anastomosis -General Surgery and gastroenterology were consulted -Underwent flexible sigmoidoscopy today; biopsy obtained from rectal nodular mucosa -CEA level ordered per general surgery -General Surgery considering colon resection of this area with end colostomy -Surgery likely during this week    Hypokalemia Potassium is 3.1 -Replace potassium and follow BMP in am     HIV disease (HCC) On Triumeq  600-50-300 as outpatient.   Follows up with ID as outpatient.  P.o. medications on hold at this time.     Hypertension Since patient is n.p.o. continue IV metoprolol , enalapril.    -Will add hydralazine  as needed     History of pulmonary embolism   History of DVT (deep vein thrombosis) Status post undergone IVC filter placement.  Currently on Lovenox .     Hyperlipidemia On Lipitor as outpatient.  Currently on hold.     Class 1 obesity BMI 32.32 kg/m.  Would benefit from weight loss as outpatient.      Medications     [MAR Hold] enalaprilat   1.25 mg Intravenous Q6H   [MAR Hold] enoxaparin  (LOVENOX ) injection  55 mg Subcutaneous Q24H   [MAR Hold] metoprolol  tartrate  5 mg Intravenous Q8H   [MAR Hold] pantoprazole  (PROTONIX ) IV  40 mg Intravenous Q24H     Data Reviewed:   CBG:  Recent Labs  Lab 04/09/23 2007  GLUCAP 126*    SpO2: 96 %    Vitals:   04/10/23 1338 04/10/23 2048 04/11/23 0616 04/11/23 0959  BP: (!) 156/115 (!) 171/124 (!) 183/127 (!) 169/151  Pulse: 77 76 72 77  Resp: 13 18  20   Temp: 97.7 F (36.5 C) 97.9 F (36.6 C) (!) 96.5 F (35.8 C) (!) 97.3 F (36.3 C)  TempSrc: Oral Oral Oral Temporal  SpO2: 94% 95% 96% 96%  Weight:      Height:          Data Reviewed:  Basic Metabolic Panel: Recent Labs  Lab 04/09/23 0846 04/10/23 0959 04/11/23 0545  NA 138 136 137  K 2.7* 2.6* 3.1*  CL 102 101 104  CO2 27 27 24   GLUCOSE 130* 137* 141*  BUN 14 9 9   CREATININE 0.72 0.62 0.71  CALCIUM  9.2 8.5* 9.3  MG 2.1  --  1.8  PHOS 3.8  --   --  CBC: Recent Labs  Lab 04/09/23 0846 04/11/23 0545  WBC 6.2 5.6  HGB 15.0 15.1  HCT 44.3 44.8  MCV 90.6 92.2  PLT 226 239    LFT Recent Labs  Lab 04/09/23 0846  AST 15  ALT 11  ALKPHOS 74  BILITOT 0.5  PROT 8.0  ALBUMIN  4.0     Antibiotics: Anti-infectives (From admission, onward)    None        DVT prophylaxis: Lovenox   Code Status: Full code  Family Communication: No family at bedside   CONSULTS General Surgery, gastroenterology   Subjective   S/p flexible sigmoidoscopy.  Denies pain.   Objective    Physical  Examination:   General-appears in no acute distress Heart-S1-S2, regular, no murmur auscultated Lungs-clear to auscultation bilaterally, no wheezing or crackles auscultated Abdomen-soft, distended, nontender to palpation Extremities-no edema in the lower extremities Neuro-alert, oriented x3, no focal deficit noted  Status is: Inpatient:             Sabas GORMAN Brod   [facility] If 7PM-7AM, please contact night-coverage at [email or web address], Office  [phone]   04/11/2023, 11:14 AM  LOS: 2 days

## 2023-04-11 NOTE — Anesthesia Procedure Notes (Signed)
 Procedure Name: MAC Date/Time: 04/11/2023 11:07 AM  Performed by: Metta Andrea NOVAK, CRNAPre-anesthesia Checklist: Patient identified, Emergency Drugs available, Suction available, Patient being monitored and Timeout performed Oxygen Delivery Method: Simple face mask Placement Confirmation: positive ETCO2

## 2023-04-12 DIAGNOSIS — K56609 Unspecified intestinal obstruction, unspecified as to partial versus complete obstruction: Secondary | ICD-10-CM | POA: Diagnosis not present

## 2023-04-12 DIAGNOSIS — B20 Human immunodeficiency virus [HIV] disease: Secondary | ICD-10-CM | POA: Diagnosis not present

## 2023-04-12 DIAGNOSIS — Z86718 Personal history of other venous thrombosis and embolism: Secondary | ICD-10-CM | POA: Diagnosis not present

## 2023-04-12 DIAGNOSIS — Z86711 Personal history of pulmonary embolism: Secondary | ICD-10-CM | POA: Diagnosis not present

## 2023-04-12 LAB — COMPREHENSIVE METABOLIC PANEL
ALT: 12 U/L (ref 0–44)
AST: 18 U/L (ref 15–41)
Albumin: 3.6 g/dL (ref 3.5–5.0)
Alkaline Phosphatase: 64 U/L (ref 38–126)
Anion gap: 6 (ref 5–15)
BUN: 11 mg/dL (ref 6–20)
CO2: 28 mmol/L (ref 22–32)
Calcium: 9 mg/dL (ref 8.9–10.3)
Chloride: 100 mmol/L (ref 98–111)
Creatinine, Ser: 0.83 mg/dL (ref 0.61–1.24)
GFR, Estimated: >60 mL/min (ref >60)
Glucose, Bld: 119 mg/dL — ABNORMAL HIGH (ref 70–99)
Potassium: 2.7 mmol/L — CL (ref 3.5–5.1)
Sodium: 134 mmol/L — ABNORMAL LOW (ref 135–145)
Total Bilirubin: 0.8 mg/dL (ref 0.0–1.2)
Total Protein: 7.2 g/dL (ref 6.5–8.1)

## 2023-04-12 LAB — CBC
HCT: 40.5 % (ref 39.0–52.0)
Hemoglobin: 14 g/dL (ref 13.0–17.0)
MCH: 31.3 pg (ref 26.0–34.0)
MCHC: 34.6 g/dL (ref 30.0–36.0)
MCV: 90.6 fL (ref 80.0–100.0)
Platelets: 213 10*3/uL (ref 150–400)
RBC: 4.47 MIL/uL (ref 4.22–5.81)
RDW: 13 % (ref 11.5–15.5)
WBC: 6.3 10*3/uL (ref 4.0–10.5)
nRBC: 0 % (ref 0.0–0.2)

## 2023-04-12 LAB — MAGNESIUM: Magnesium: 1.6 mg/dL — ABNORMAL LOW (ref 1.7–2.4)

## 2023-04-12 LAB — CEA: CEA: 1.3 ng/mL (ref 0.0–4.7)

## 2023-04-12 LAB — SURGICAL PATHOLOGY

## 2023-04-12 MED ORDER — SODIUM CHLORIDE 0.9 % IV SOLN
2.0000 g | INTRAVENOUS | Status: AC
  Administered 2023-04-13: 2 g via INTRAVENOUS
  Filled 2023-04-12: qty 2

## 2023-04-12 MED ORDER — POTASSIUM CHLORIDE 10 MEQ/100ML IV SOLN
10.0000 meq | INTRAVENOUS | Status: AC
Start: 2023-04-12 — End: 2023-04-12
  Administered 2023-04-12 (×6): 10 meq via INTRAVENOUS
  Filled 2023-04-12 (×6): qty 100

## 2023-04-12 MED ORDER — MAGNESIUM SULFATE 2 GM/50ML IV SOLN
2.0000 g | Freq: Once | INTRAVENOUS | Status: AC
Start: 2023-04-12 — End: 2023-04-12
  Administered 2023-04-12: 2 g via INTRAVENOUS
  Filled 2023-04-12: qty 50

## 2023-04-12 NOTE — Progress Notes (Signed)
 1 Day Post-Op   Subjective/Chief Complaint: Denies abdominal pain Reports passing flatus and having some liquid BM's   Objective: Vital signs in last 24 hours: Temp:  [97.3 F (36.3 C)-98.2 F (36.8 C)] 97.6 F (36.4 C) (01/07 0502) Pulse Rate:  [72-94] 79 (01/07 0502) Resp:  [16-20] 18 (01/07 0502) BP: (103-173)/(73-151) 173/121 (01/07 0502) SpO2:  [92 %-99 %] 96 % (01/07 0502) Last BM Date : 04/11/23  Intake/Output from previous day: 01/06 0701 - 01/07 0700 In: 328.2 [I.V.:328.2] Out: 500 [Urine:500] Intake/Output this shift: No intake/output data recorded.  Exam: Awake and alert Abdomen extremely distended, tympanitic, but non tender   Lab Results:  Recent Labs    04/11/23 0545 04/12/23 0529  WBC 5.6 6.3  HGB 15.1 14.0  HCT 44.8 40.5  PLT 239 213   BMET Recent Labs    04/11/23 0545 04/12/23 0529  NA 137 134*  K 3.1* 2.7*  CL 104 100  CO2 24 28  GLUCOSE 141* 119*  BUN 9 11  CREATININE 0.71 0.83  CALCIUM  9.3 9.0   PT/INR No results for input(s): LABPROT, INR in the last 72 hours. ABG No results for input(s): PHART, HCO3 in the last 72 hours.  Invalid input(s): PCO2, PO2  Studies/Results: DG CHEST PORT 1 VIEW Result Date: 04/11/2023 CLINICAL DATA:  Shortness of breath EXAM: PORTABLE CHEST 1 VIEW COMPARISON:  02/25/2021 FINDINGS: Cardiac shadow is enlarged but stable. Lungs are well aerated bilaterally. Elevation of the left hemidiaphragm is noted with mild left basilar atelectasis. No bony abnormality is noted. IMPRESSION: Left basilar atelectasis. Electronically Signed   By: Oneil Devonshire M.D.   On: 04/11/2023 17:37    Anti-infectives: Anti-infectives (From admission, onward)    None       Assessment/Plan: Large Bowel Obstruction  I again discussed the findings of the CT scan and the flex sig with the patient.  I recommend proceeding to the OR tomorrow for a laparotomy with colon resection and end colostomy given the near  complete blockage of the colon at the previous anastomosis.  GI was unable to traverse the area so without surgery, complete obstruction an likely colon perforation is imminent.  His CEA is normal.  Biopsy results are pending. I explained the surgical procedure in detail.  I discussed the risks which includes but are not limited to bleeding, infection, ostomy necrosis, injury to surrounding structures including the bowel, cardiopulmonary issues, DVT, etc.  He agrees to proceed with surgery   LOS: 3 days    Vicenta Poli 04/12/2023

## 2023-04-12 NOTE — Plan of Care (Signed)
  Problem: Education: Goal: Knowledge of General Education information will improve Description: Including pain rating scale, medication(s)/side effects and non-pharmacologic comfort measures Outcome: Progressing   Problem: Health Behavior/Discharge Planning: Goal: Ability to manage health-related needs will improve Outcome: Progressing   Problem: Clinical Measurements: Goal: Will remain free from infection Outcome: Progressing Goal: Diagnostic test results will improve Outcome: Progressing Goal: Respiratory complications will improve Outcome: Progressing Goal: Cardiovascular complication will be avoided Outcome: Progressing   Problem: Activity: Goal: Risk for activity intolerance will decrease Outcome: Progressing   Problem: Elimination: Goal: Will not experience complications related to urinary retention Outcome: Progressing   Problem: Safety: Goal: Ability to remain free from injury will improve Outcome: Progressing   Problem: Skin Integrity: Goal: Risk for impaired skin integrity will decrease Outcome: Progressing

## 2023-04-12 NOTE — Progress Notes (Signed)
 Sigmoidoscopy biopsy results pending.  Case discussed with Dr. Magnus Ivan, sounds like surgery planned in the next day or two.  [facility] will sign-off; please call with questions; thank you for the consultation.

## 2023-04-12 NOTE — Progress Notes (Signed)
 [facility]  PROGRESS NOTE  ELVYN KROHN FMW:[id number] DOB: Feb 27, 1966 DOA: 04/09/2023 PCP: [facility]   Brief HPI:   58 y.o. male with medical history significant of back pain, history of colitis, diverticulosis, DVT, hemorrhoids, herpes zoster, HIV, hypertension, multiple episodes of perianal/perirectal abscess,  diverting loop descending colostomy in 2013 with subsequent reversal of the colostomy in July of this year presented to hospital with abdominal distention associated with nausea and diarrhea.  In the ED patient had elevated blood pressure otherwise vital stable.  Labs showed hypokalemia.  CBC lipase was within normal range urinalysis with proteinuria. CT abdomen/pelvis with contrast showing a severely dilated colon with a transition point in the left hemipelvis just above the area of the suture line at colostomy takedown and anastomosis.  The tissue in this location was nodular and thickened.  General surgery was consulted and patient was out of the hospital for further evaluation and treatment.     Assessment/Plan:   Bowel obstruction secondary to colonic obstruction -Presented with abdominal distention associated with nausea and diarrhea -CT abdomen/pelvis showed dilated colon with transition point in the left renal pelvis just above the area of the suture line at colostomy takedown and anastomosis -General Surgery and gastroenterology were consulted -Underwent flexible sigmoidoscopy today; biopsy obtained from rectal nodular mucosa -CEA level came back normal at 1.3 -Plan for colon resection with end colostomy in a.m. -Keep n.p.o. after midnight    Hypokalemia Potassium is 2.7 -Replace potassium and follow BMP in am  Hypomagnesemia -Magnesium  1.6 -Replace magnesium  with magnesium  sulfate 2 g IV x 1 -Check magnesium  level in a.m.     HIV disease (HCC) On Triumeq  600-50-300 as outpatient.   Follows up with ID as outpatient.  P.o. medications on hold at this  time.     Hypertension Since patient is n.p.o. continue IV metoprolol , enalapril.   -Will add hydralazine  as needed     History of pulmonary embolism   History of DVT (deep vein thrombosis) Status post undergone IVC filter placement.  Currently on Lovenox .     Hyperlipidemia On Lipitor as outpatient.  Currently on hold.     Class 1 obesity BMI 32.32 kg/m.  Would benefit from weight loss as outpatient.      Medications     enalaprilat   1.25 mg Intravenous Q6H   enoxaparin  (LOVENOX ) injection  55 mg Subcutaneous Q24H   feeding supplement  1 Container Oral TID BM   metoprolol  tartrate  5 mg Intravenous Q8H   pantoprazole  (PROTONIX ) IV  40 mg Intravenous Q24H     Data Reviewed:   CBG:  Recent Labs  Lab 04/09/23 2007  GLUCAP 126*    SpO2: 94 % O2 Flow Rate (L/min): 10 L/min    Vitals:   04/11/23 1319 04/11/23 1931 04/12/23 0502 04/12/23 1326  BP: (!) 153/109 (!) 158/105 (!) 173/121 (!) 127/96  Pulse: 72 94 79 71  Resp: 20 18 18 16   Temp: 97.6 F (36.4 C) 98.2 F (36.8 C) 97.6 F (36.4 C) 97.8 F (36.6 C)  TempSrc: Oral Oral Oral Oral  SpO2: 96% 99% 96% 94%  Weight:      Height:          Data Reviewed:  Basic Metabolic Panel: Recent Labs  Lab 04/09/23 0846 04/10/23 0959 04/11/23 0545 04/12/23 0529  NA 138 136 137 134*  K 2.7* 2.6* 3.1* 2.7*  CL 102 101 104 100  CO2 27 27 24 28   GLUCOSE 130* 137* 141*  119*  BUN 14 9 9 11   CREATININE 0.72 0.62 0.71 0.83  CALCIUM  9.2 8.5* 9.3 9.0  MG 2.1  --  1.8 1.6*  PHOS 3.8  --   --   --     CBC: Recent Labs  Lab 04/09/23 0846 04/11/23 0545 04/12/23 0529  WBC 6.2 5.6 6.3  HGB 15.0 15.1 14.0  HCT 44.3 44.8 40.5  MCV 90.6 92.2 90.6  PLT 226 239 213    LFT Recent Labs  Lab 04/09/23 0846 04/12/23 0529  AST 15 18  ALT 11 12  ALKPHOS 74 64  BILITOT 0.5 0.8  PROT 8.0 7.2  ALBUMIN  4.0 3.6     Antibiotics: Anti-infectives (From admission, onward)    Start     Dose/Rate Route Frequency  Ordered Stop   04/13/23 0700  cefoTEtan  (CEFOTAN ) 2 g in sodium chloride  0.9 % 100 mL IVPB       Note to Pharmacy: Preop tomorrow for surgery tomorrow   2 g 200 mL/hr over 30 Minutes Intravenous [facility] 04/12/23 0846 04/14/23 0700        DVT prophylaxis: Lovenox   Code Status: Full code  Family Communication: No family at bedside   CONSULTS General Surgery, gastroenterology   Subjective    Denies abdominal pain.  Plan for surgery in AM.  Objective    Physical Examination:  General-appears in no acute distress Heart-S1-S2, regular, no murmur auscultated Lungs-clear to auscultation bilaterally, no wheezing or crackles auscultated Abdomen-soft, nontender, distended, no organomegaly Extremities-no edema in the lower extremities Neuro-alert, oriented x3, no focal deficit noted   Status is: Inpatient:             Sabas GORMAN Brod   [facility] If 7PM-7AM, please contact night-coverage at [email or web address], Office  [phone]   04/12/2023, 2:12 PM  LOS: 3 days

## 2023-04-12 NOTE — Anesthesia Postprocedure Evaluation (Signed)
 Anesthesia Post Note  Patient: Lemond VEAR Finder  Procedure(s) Performed: FLEXIBLE SIGMOIDOSCOPY (Left) BIOPSY     Patient location during evaluation: Endoscopy Anesthesia Type: MAC Level of consciousness: awake Pain management: pain level controlled Vital Signs Assessment: post-procedure vital signs reviewed and stable Respiratory status: spontaneous breathing, nonlabored ventilation and respiratory function stable Cardiovascular status: blood pressure returned to baseline and stable Postop Assessment: no apparent nausea or vomiting Anesthetic complications: no   No notable events documented.  Last Vitals:  Vitals:   04/11/23 1931 04/12/23 0502  BP: (!) 158/105 (!) 173/121  Pulse: 94 79  Resp: 18 18  Temp: 36.8 C 36.4 C  SpO2: 99% 96%    Last Pain:  Vitals:   04/12/23 0502  TempSrc: Oral  PainSc:                  Bernardino SQUIBB Gurnoor Ursua

## 2023-04-13 ENCOUNTER — Inpatient Hospital Stay (HOSPITAL_COMMUNITY): Payer: 59 | Admitting: Anesthesiology

## 2023-04-13 ENCOUNTER — Encounter (HOSPITAL_COMMUNITY)
Admission: EM | Disposition: A | Discharge status: Home / Self Care | Payer: Self-pay | Source: Home / Self Care | Attending: Internal Medicine

## 2023-04-13 ENCOUNTER — Encounter (HOSPITAL_COMMUNITY): Payer: Self-pay

## 2023-04-13 ENCOUNTER — Other Ambulatory Visit: Payer: Self-pay

## 2023-04-13 DIAGNOSIS — Z86711 Personal history of pulmonary embolism: Secondary | ICD-10-CM | POA: Diagnosis not present

## 2023-04-13 DIAGNOSIS — Z87891 Personal history of nicotine dependence: Secondary | ICD-10-CM

## 2023-04-13 DIAGNOSIS — E785 Hyperlipidemia, unspecified: Secondary | ICD-10-CM

## 2023-04-13 DIAGNOSIS — Z86718 Personal history of other venous thrombosis and embolism: Secondary | ICD-10-CM | POA: Diagnosis not present

## 2023-04-13 DIAGNOSIS — B20 Human immunodeficiency virus [HIV] disease: Secondary | ICD-10-CM | POA: Diagnosis not present

## 2023-04-13 DIAGNOSIS — K56609 Unspecified intestinal obstruction, unspecified as to partial versus complete obstruction: Secondary | ICD-10-CM

## 2023-04-13 DIAGNOSIS — I10 Essential (primary) hypertension: Secondary | ICD-10-CM

## 2023-04-13 HISTORY — PX: LAPAROTOMY: SHX154

## 2023-04-13 LAB — BASIC METABOLIC PANEL
Anion gap: 10 (ref 5–15)
Anion gap: 10 (ref 5–15)
BUN: 9 mg/dL (ref 6–20)
BUN: 11 mg/dL (ref 6–20)
CO2: 22 mmol/L (ref 22–32)
CO2: 23 mmol/L (ref 22–32)
Calcium: 8.6 mg/dL — ABNORMAL LOW (ref 8.9–10.3)
Calcium: 8.4 mg/dL — ABNORMAL LOW (ref 8.9–10.3)
Chloride: 102 mmol/L (ref 98–111)
Chloride: 104 mmol/L (ref 98–111)
Creatinine, Ser: 0.51 mg/dL — ABNORMAL LOW (ref 0.61–1.24)
Creatinine, Ser: 1.12 mg/dL (ref 0.61–1.24)
GFR, Estimated: >60 mL/min (ref >60)
GFR, Estimated: >60 mL/min (ref >60)
Glucose, Bld: 97 mg/dL (ref 70–99)
Glucose, Bld: 157 mg/dL — ABNORMAL HIGH (ref 70–99)
Potassium: 2.6 mmol/L — CL (ref 3.5–5.1)
Potassium: 3.3 mmol/L — ABNORMAL LOW (ref 3.5–5.1)
Sodium: 134 mmol/L — ABNORMAL LOW (ref 135–145)
Sodium: 137 mmol/L (ref 135–145)

## 2023-04-13 LAB — TYPE AND SCREEN
ABO/RH(D): O POS
Antibody Screen: NEGATIVE

## 2023-04-13 LAB — MAGNESIUM: Magnesium: 1.8 mg/dL (ref 1.7–2.4)

## 2023-04-13 SURGERY — LAPAROTOMY, EXPLORATORY
Anesthesia: General

## 2023-04-13 MED ORDER — STERILE WATER FOR IRRIGATION IR SOLN
Status: DC | PRN
  Administered 2023-04-13: 500 mL

## 2023-04-13 MED ORDER — KCL IN DEXTROSE-NACL 10-5-0.45 MEQ/L-%-% IV SOLN
INTRAVENOUS | Status: AC
  Filled 2023-04-13 (×3): qty 1000

## 2023-04-13 MED ORDER — AMISULPRIDE (ANTIEMETIC) 5 MG/2ML IV SOLN: 10.0000 mg | Freq: Once | INTRAVENOUS | Status: DC | PRN

## 2023-04-13 MED ORDER — ONDANSETRON HCL 4 MG/2ML IJ SOLN
INTRAMUSCULAR | Status: AC
  Filled 2023-04-13: qty 2

## 2023-04-13 MED ORDER — KETOROLAC TROMETHAMINE 30 MG/ML IJ SOLN
30.0000 mg | Freq: Once | INTRAMUSCULAR | Status: AC
  Administered 2023-04-13: 30 mg via INTRAVENOUS

## 2023-04-13 MED ORDER — FENTANYL CITRATE (PF) 100 MCG/2ML IJ SOLN
INTRAMUSCULAR | Status: AC
  Filled 2023-04-13: qty 2

## 2023-04-13 MED ORDER — ACETAMINOPHEN 500 MG PO TABS: 1000.0000 mg | ORAL_TABLET | Freq: Once | ORAL | Status: DC

## 2023-04-13 MED ORDER — DIPHENHYDRAMINE HCL 50 MG/ML IJ SOLN: 12.5000 mg | Freq: Four times a day (QID) | INTRAMUSCULAR | Status: DC | PRN

## 2023-04-13 MED ORDER — LIDOCAINE HCL (PF) 2 % IJ SOLN
INTRAMUSCULAR | Status: AC
  Filled 2023-04-13: qty 5

## 2023-04-13 MED ORDER — DEXAMETHASONE SODIUM PHOSPHATE 10 MG/ML IJ SOLN
INTRAMUSCULAR | Status: AC
  Filled 2023-04-13: qty 1

## 2023-04-13 MED ORDER — PHENYLEPHRINE HCL-NACL 20-0.9 MG/250ML-% IV SOLN
INTRAVENOUS | Status: DC | PRN
  Administered 2023-04-13: 40 ug/min via INTRAVENOUS

## 2023-04-13 MED ORDER — PROPOFOL 1000 MG/100ML IV EMUL
INTRAVENOUS | Status: AC
  Filled 2023-04-13: qty 100

## 2023-04-13 MED ORDER — SODIUM CHLORIDE 0.9% FLUSH: 9.0000 mL | INTRAVENOUS | Status: DC | PRN

## 2023-04-13 MED ORDER — BUPIVACAINE HCL (PF) 0.5 % IJ SOLN
INTRAMUSCULAR | Status: AC
  Filled 2023-04-13: qty 30

## 2023-04-13 MED ORDER — DEXAMETHASONE SODIUM PHOSPHATE 4 MG/ML IJ SOLN
INTRAMUSCULAR | Status: DC | PRN
  Administered 2023-04-13: 6 mg via INTRAVENOUS

## 2023-04-13 MED ORDER — ALBUMIN HUMAN 5 % IV SOLN: INTRAVENOUS | Status: DC | PRN

## 2023-04-13 MED ORDER — MIDAZOLAM HCL 5 MG/5ML IJ SOLN
INTRAMUSCULAR | Status: DC | PRN
  Administered 2023-04-13: 2 mg via INTRAVENOUS

## 2023-04-13 MED ORDER — ROCURONIUM BROMIDE 100 MG/10ML IV SOLN
INTRAVENOUS | Status: DC | PRN
  Administered 2023-04-13: 30 mg via INTRAVENOUS
  Administered 2023-04-13 (×2): 10 mg via INTRAVENOUS
  Administered 2023-04-13 (×2): 20 mg via INTRAVENOUS

## 2023-04-13 MED ORDER — CHLORHEXIDINE GLUCONATE 0.12 % MT SOLN
15.0000 mL | Freq: Once | OROMUCOSAL | Status: AC
  Administered 2023-04-13: 15 mL via OROMUCOSAL

## 2023-04-13 MED ORDER — HYDROMORPHONE HCL 1 MG/ML IJ SOLN
INTRAMUSCULAR | Status: AC
  Filled 2023-04-13: qty 1

## 2023-04-13 MED ORDER — FENTANYL CITRATE PF 50 MCG/ML IJ SOSY
25.0000 ug | PREFILLED_SYRINGE | INTRAMUSCULAR | Status: DC | PRN
  Administered 2023-04-13 (×2): 50 ug via INTRAVENOUS

## 2023-04-13 MED ORDER — METHOCARBAMOL 1000 MG/10ML IJ SOLN
500.0000 mg | Freq: Four times a day (QID) | INTRAMUSCULAR | Status: DC | PRN
  Administered 2023-04-17: 500 mg via INTRAVENOUS
  Filled 2023-04-13 (×3): qty 5

## 2023-04-13 MED ORDER — PROPOFOL 10 MG/ML IV BOLUS
INTRAVENOUS | Status: DC | PRN
  Administered 2023-04-13: 160 mg via INTRAVENOUS
  Administered 2023-04-13: 40 mg via INTRAVENOUS

## 2023-04-13 MED ORDER — ONDANSETRON HCL 4 MG/2ML IJ SOLN: 4.0000 mg | Freq: Once | INTRAMUSCULAR | Status: DC | PRN

## 2023-04-13 MED ORDER — POTASSIUM CHLORIDE 10 MEQ/100ML IV SOLN
10.0000 meq | INTRAVENOUS | Status: AC
  Administered 2023-04-13 (×2): 10 meq via INTRAVENOUS
  Filled 2023-04-13 (×2): qty 100

## 2023-04-13 MED ORDER — HYDROMORPHONE HCL 1 MG/ML IJ SOLN
0.2500 mg | INTRAMUSCULAR | Status: DC | PRN
  Administered 2023-04-13 (×4): 0.5 mg via INTRAVENOUS

## 2023-04-13 MED ORDER — ALBUMIN HUMAN 5 % IV SOLN
INTRAVENOUS | Status: AC
Start: 2023-04-13 — End: ?
  Filled 2023-04-13: qty 250

## 2023-04-13 MED ORDER — SUGAMMADEX SODIUM 200 MG/2ML IV SOLN
INTRAVENOUS | Status: DC | PRN
  Administered 2023-04-13: 200 mg via INTRAVENOUS

## 2023-04-13 MED ORDER — ONDANSETRON HCL 4 MG/2ML IJ SOLN
INTRAMUSCULAR | Status: DC | PRN
  Administered 2023-04-13: 4 mg via INTRAVENOUS

## 2023-04-13 MED ORDER — FENTANYL CITRATE (PF) 100 MCG/2ML IJ SOLN
INTRAMUSCULAR | Status: DC | PRN
  Administered 2023-04-13 (×2): 50 ug via INTRAVENOUS
  Administered 2023-04-13: 100 ug via INTRAVENOUS

## 2023-04-13 MED ORDER — KETAMINE HCL 50 MG/5ML IJ SOSY
PREFILLED_SYRINGE | INTRAMUSCULAR | Status: AC
  Filled 2023-04-13: qty 5

## 2023-04-13 MED ORDER — SUCCINYLCHOLINE CHLORIDE 200 MG/10ML IV SOSY
PREFILLED_SYRINGE | INTRAVENOUS | Status: AC
Start: 2023-04-13 — End: ?
  Filled 2023-04-13: qty 10

## 2023-04-13 MED ORDER — PHENYLEPHRINE 80 MCG/ML (10ML) SYRINGE FOR IV PUSH (FOR BLOOD PRESSURE SUPPORT)
PREFILLED_SYRINGE | INTRAVENOUS | Status: AC
Start: 2023-04-13 — End: ?
  Filled 2023-04-13: qty 10

## 2023-04-13 MED ORDER — HYDROMORPHONE 1 MG/ML IV SOLN
INTRAVENOUS | Status: DC
Start: 2023-04-13 — End: 2023-04-16
  Administered 2023-04-13: 30 mg via INTRAVENOUS
  Administered 2023-04-13: 1.1 mg via INTRAVENOUS
  Administered 2023-04-14: 0.9 mg via INTRAVENOUS
  Administered 2023-04-14: 1.5 mg via INTRAVENOUS
  Administered 2023-04-14: 1.2 mg via INTRAVENOUS
  Administered 2023-04-14: 0.9 mg via INTRAVENOUS
  Administered 2023-04-14: 2.1 mg via INTRAVENOUS
  Administered 2023-04-15: 0.9 mg via INTRAVENOUS
  Administered 2023-04-15: 3 mg via INTRAVENOUS
  Administered 2023-04-15 (×2): 0.9 mg via INTRAVENOUS
  Administered 2023-04-16: 0.6 mg via INTRAVENOUS
  Administered 2023-04-16 (×2): 0.9 mg via INTRAVENOUS
  Filled 2023-04-13: qty 30

## 2023-04-13 MED ORDER — DIPHENHYDRAMINE HCL 12.5 MG/5ML PO ELIX: 12.5000 mg | ORAL_SOLUTION | Freq: Four times a day (QID) | ORAL | Status: DC | PRN

## 2023-04-13 MED ORDER — KETOROLAC TROMETHAMINE 30 MG/ML IJ SOLN
INTRAMUSCULAR | Status: AC
  Filled 2023-04-13: qty 1

## 2023-04-13 MED ORDER — PHENYLEPHRINE 80 MCG/ML (10ML) SYRINGE FOR IV PUSH (FOR BLOOD PRESSURE SUPPORT)
PREFILLED_SYRINGE | INTRAVENOUS | Status: DC | PRN
  Administered 2023-04-13: 160 ug via INTRAVENOUS
  Administered 2023-04-13: 80 ug via INTRAVENOUS
  Administered 2023-04-13: 160 ug via INTRAVENOUS
  Administered 2023-04-13 (×2): 80 ug via INTRAVENOUS
  Administered 2023-04-13: 240 ug via INTRAVENOUS
  Administered 2023-04-13 (×2): 80 ug via INTRAVENOUS

## 2023-04-13 MED ORDER — ROCURONIUM BROMIDE 10 MG/ML (PF) SYRINGE
PREFILLED_SYRINGE | INTRAVENOUS | Status: AC
Start: 2023-04-13 — End: ?
  Filled 2023-04-13: qty 10

## 2023-04-13 MED ORDER — LIDOCAINE HCL (CARDIAC) PF 100 MG/5ML IV SOSY
PREFILLED_SYRINGE | INTRAVENOUS | Status: DC | PRN
  Administered 2023-04-13: 100 mg via INTRAVENOUS

## 2023-04-13 MED ORDER — NALOXONE HCL 0.4 MG/ML IJ SOLN: 0.4000 mg | INTRAMUSCULAR | Status: DC | PRN

## 2023-04-13 MED ORDER — ACETAMINOPHEN 10 MG/ML IV SOLN
1000.0000 mg | Freq: Four times a day (QID) | INTRAVENOUS | Status: AC
  Administered 2023-04-13 – 2023-04-14 (×4): 1000 mg via INTRAVENOUS
  Filled 2023-04-13 (×4): qty 100

## 2023-04-13 MED ORDER — LACTATED RINGERS IV SOLN: INTRAVENOUS | Status: DC

## 2023-04-13 MED ORDER — FENTANYL CITRATE PF 50 MCG/ML IJ SOSY
PREFILLED_SYRINGE | INTRAMUSCULAR | Status: AC
  Filled 2023-04-13: qty 2

## 2023-04-13 MED ORDER — 0.9 % SODIUM CHLORIDE (POUR BTL) OPTIME
TOPICAL | Status: DC | PRN
  Administered 2023-04-13 (×2): 1000 mL
  Administered 2023-04-13: 2000 mL

## 2023-04-13 MED ORDER — SUCCINYLCHOLINE CHLORIDE 200 MG/10ML IV SOSY
PREFILLED_SYRINGE | INTRAVENOUS | Status: DC | PRN
  Administered 2023-04-13: 120 mg via INTRAVENOUS

## 2023-04-13 MED ORDER — MIDAZOLAM HCL 2 MG/2ML IJ SOLN
INTRAMUSCULAR | Status: AC
  Filled 2023-04-13: qty 2

## 2023-04-13 MED ORDER — SODIUM CHLORIDE 0.9 % IV SOLN
2.0000 g | Freq: Two times a day (BID) | INTRAVENOUS | Status: DC
  Administered 2023-04-13 – 2023-04-15 (×5): 2 g via INTRAVENOUS
  Filled 2023-04-13 (×6): qty 2

## 2023-04-13 SURGICAL SUPPLY — 36 items
BAG COUNTER SPONGE SURGICOUNT (BAG) IMPLANT
BLADE EXTENDED COATED 6.5IN (ELECTRODE) IMPLANT
CHLORAPREP W/TINT 26 (MISCELLANEOUS) ×1 IMPLANT
COVER MAYO STAND STRL (DRAPES) ×1 IMPLANT
COVER SURGICAL LIGHT HANDLE (MISCELLANEOUS) ×1 IMPLANT
DRAPE LAPAROSCOPIC ABDOMINAL (DRAPES) ×1 IMPLANT
DRAPE WARM FLUID 44X44 (DRAPES) ×1 IMPLANT
DRSG OPSITE POSTOP 4X12 (GAUZE/BANDAGES/DRESSINGS) IMPLANT
ELECT REM PT RETURN 15FT ADLT (MISCELLANEOUS) ×1 IMPLANT
GLOVE BIO SURGEON STRL SZ7.5 (GLOVE) ×1 IMPLANT
GOWN STRL REUS W/ TWL XL LVL3 (GOWN DISPOSABLE) ×1 IMPLANT
HANDLE SUCTION POOLE (INSTRUMENTS) ×1 IMPLANT
KIT BASIN OR (CUSTOM PROCEDURE TRAY) ×1 IMPLANT
KIT TURNOVER KIT A (KITS) IMPLANT
LIGASURE IMPACT 36 18CM CVD LR (INSTRUMENTS) IMPLANT
NS IRRIG 1000ML POUR BTL (IV SOLUTION) ×1 IMPLANT
PACK GENERAL/GYN (CUSTOM PROCEDURE TRAY) ×1 IMPLANT
RELOAD PROXIMATE 75MM BLUE (ENDOMECHANICALS) IMPLANT
RELOAD STAPLE 75 3.8 BLU REG (ENDOMECHANICALS) IMPLANT
RETRACTOR WOUND ALXS 34CM XLRG (MISCELLANEOUS) IMPLANT
RTRCTR WOUND ALEXIS 34CM XLRG (MISCELLANEOUS) ×1
SPONGE T-LAP 18X18 ~~LOC~~+RFID (SPONGE) IMPLANT
STAPLER GUN LINEAR PROX 60 (STAPLE) IMPLANT
STAPLER PROXIMATE 100MM BLUE (MISCELLANEOUS) IMPLANT
STAPLER PROXIMATE 75MM BLUE (STAPLE) IMPLANT
STAPLER SKIN PROX WIDE 3.9 (STAPLE) IMPLANT
SUCTION POOLE HANDLE (INSTRUMENTS) ×2
SUT PDS AB 1 CT1 27 (SUTURE) IMPLANT
SUT PDS AB 1 TP1 96 (SUTURE) IMPLANT
SUT SILK 2 0 SH CR/8 (SUTURE) ×1 IMPLANT
SUT SILK 2-0 18XBRD TIE 12 (SUTURE) ×1 IMPLANT
SUT SILK 3 0 SH CR/8 (SUTURE) ×1 IMPLANT
SUT VIC AB 3-0 SH 18 (SUTURE) IMPLANT
TOWEL OR 17X24 6PK STRL BLUE (TOWEL DISPOSABLE) IMPLANT
TOWEL OR 17X26 10 PK STRL BLUE (TOWEL DISPOSABLE) ×1 IMPLANT
TRAY FOLEY MTR SLVR 16FR STAT (SET/KITS/TRAYS/PACK) ×1 IMPLANT

## 2023-04-13 NOTE — Progress Notes (Signed)
 [facility]  PROGRESS NOTE  Leon Nichols FMW:[id number] DOB: 12-18-1965 DOA: 04/09/2023 PCP: [facility]   Brief HPI:   58 y.o. male with medical history significant of back pain, history of colitis, diverticulosis, DVT, hemorrhoids, herpes zoster, HIV, hypertension, multiple episodes of perianal/perirectal abscess,  diverting loop descending colostomy in 2013 with subsequent reversal of the colostomy in July of this year presented to hospital with abdominal distention associated with nausea and diarrhea.  In the ED patient had elevated blood pressure otherwise vital stable.  Labs showed hypokalemia.  CBC lipase was within normal range urinalysis with proteinuria. CT abdomen/pelvis with contrast showing a severely dilated colon with a transition point in the left hemipelvis just above the area of the suture line at colostomy takedown and anastomosis.  The tissue in this location was nodular and thickened.  General surgery was consulted and patient was out of the hospital for further evaluation and treatment.     Assessment/Plan:   Bowel obstruction secondary to colonic obstruction; status post partial colectomy, and distal transverse colostomy -Presented with abdominal distention associated with nausea and diarrhea -CT abdomen/pelvis showed dilated colon with transition point in the left renal pelvis just above the area of the suture line at colostomy takedown and anastomosis -General Surgery and gastroenterology were consulted -Underwent flexible sigmoidoscopy ; biopsy obtained from rectal nodular mucosa -CEA level came back normal at 1.3 -Underwent expiratory laparotomy, partial colectomy and distal transverse colostomy    Hypokalemia Potassium is 3.3 -Replace potassium and follow BMP in am  Hypomagnesemia -Replete     HIV disease (HCC) On Triumeq  600-50-300 as outpatient.   Follows up with ID as outpatient.  P.o. medications on hold at this time.     Hypertension Since  patient is n.p.o. continue IV metoprolol , enalapril.   -Will add hydralazine  as needed     History of pulmonary embolism   History of DVT (deep vein thrombosis) Status post undergone IVC filter placement.  Currently on Lovenox .     Hyperlipidemia On Lipitor as outpatient.  Currently on hold.     Class 1 obesity BMI 32.32 kg/m.  Would benefit from weight loss as outpatient.      Medications     enalaprilat   1.25 mg Intravenous Q6H   fentaNYL        HYDROmorphone        HYDROmorphone        HYDROmorphone    Intravenous Q4H   ketorolac        metoprolol  tartrate  5 mg Intravenous Q8H   pantoprazole  (PROTONIX ) IV  40 mg Intravenous Q24H     Data Reviewed:   CBG:  Recent Labs  Lab 04/09/23 2007  GLUCAP 126*    SpO2: 99 % O2 Flow Rate (L/min): 2 L/min    Vitals:   04/13/23 1515 04/13/23 1530 04/13/23 1545 04/13/23 1601  BP: (!) 154/98 (!) 143/103 (!) 133/101 (!) 133/101  Pulse: 89 91 94 96  Resp: (!) 21 20 18  (!) 27  Temp:   98.9 F (37.2 C) 98 F (36.7 C)  TempSrc:    Oral  SpO2: 98% 98% 99%   Weight:      Height:          Data Reviewed:  Basic Metabolic Panel: Recent Labs  Lab 04/09/23 0846 04/10/23 0959 04/11/23 0545 04/12/23 0529 04/13/23 0534 04/13/23 1436  NA 138 136 137 134* 134* 137  K 2.7* 2.6* 3.1* 2.7* 2.6* 3.3*  CL 102 101 104 100 102 104  CO2  27 27 24 28 22 23   GLUCOSE 130* 137* 141* 119* 97 157*  BUN 14 9 9 11 9 11   CREATININE 0.72 0.62 0.71 0.83 0.51* 1.12  CALCIUM  9.2 8.5* 9.3 9.0 8.6* 8.4*  MG 2.1  --  1.8 1.6* 1.8  --   PHOS 3.8  --   --   --   --   --     CBC: Recent Labs  Lab 04/09/23 0846 04/11/23 0545 04/12/23 0529  WBC 6.2 5.6 6.3  HGB 15.0 15.1 14.0  HCT 44.3 44.8 40.5  MCV 90.6 92.2 90.6  PLT 226 239 213    LFT Recent Labs  Lab 04/09/23 0846 04/12/23 0529  AST 15 18  ALT 11 12  ALKPHOS 74 64  BILITOT 0.5 0.8  PROT 8.0 7.2  ALBUMIN  4.0 3.6     Antibiotics: Anti-infectives (From admission,  onward)    Start     Dose/Rate Route Frequency Ordered Stop   04/13/23 2200  cefoTEtan  (CEFOTAN ) 2 g in sodium chloride  0.9 % 100 mL IVPB        2 g 200 mL/hr over 30 Minutes Intravenous Every 12 hours 04/13/23 1608     04/13/23 0700  cefoTEtan  (CEFOTAN ) 2 g in sodium chloride  0.9 % 100 mL IVPB       Note to Pharmacy: Preop tomorrow for surgery tomorrow   2 g 200 mL/hr over 30 Minutes Intravenous To [facility] 04/12/23 0846 04/13/23 1140        DVT prophylaxis: Lovenox   Code Status: Full code  Family Communication: No family at bedside   CONSULTS General Surgery, gastroenterology   Subjective   Patient seen after surgery.   Objective    Physical Examination:  General-appears in no acute distress Heart-S1-S2, regular, no murmur auscultated Lungs-clear to auscultation bilaterally, no wheezing or crackles auscultated Abdomen-soft, distended Extremities-no edema in the lower extremities Neuro-alert, oriented x3, no focal deficit noted  Status is: Inpatient:             Leon Nichols   [facility] If 7PM-7AM, please contact night-coverage at [email or web address], Office  [phone]   04/13/2023, 4:09 PM  LOS: 4 days

## 2023-04-13 NOTE — Plan of Care (Signed)

## 2023-04-13 NOTE — Op Note (Signed)
 Leon Nichols 04/09/2023 - 04/13/2023   Pre-op Diagnosis: LARGE BOWEL OBSTRUCTION     Post-op Diagnosis: SAME  Procedure(s): EXPLORATORY LAPAROTOMY PARTIAL COLECTOMY END DISTAL TRANSVERSE COLOSTOMY TAKEDOWN OF THE SPLENIC FLEXURE  Surgeon(s): Vernetta Berg, MD Teresa Lonni HERO, MD  Anesthesia: Choice  Staff:  Circulator: Antonetta Ivin LABOR, RN Relief Circulator: Purnell Pool, RN Scrub Person: Rama Jacques [name], [name]  Estimated Blood Loss: Minimal               Specimens: previous descending colon anastomosis and splenic flexure  MD assistance was required given to over all complexity of the case and patient's massively dilated colon.  Indications: This is a 58 year old gentleman with a previous history of perianal disease that required a loop descending colostomy.  This had been performed in 2013.  In July of last year he underwent takedown of the diverting loop colostomy with colon anastomosis.  Several months ago he started having abdominal distention and presented with a near complete obstruction of his distal colon.  CT scan suggest that it was just proximal to the colonic anastomosis.  He underwent endoscopy and the stricture could not be traversed.  Biopsies of nodular tissue in the area were negative for malignancy.  A CEA was also normal.  Given the near complete obstruction, the decision was made to proceed to the operating room for exploratory laparotomy with planned partial colectomy and colostomy  Findings: The patient was found to have stricturing of the descending colon at the proximal part or just proximal to the side-to-side colon anastomosis.  There is extreme amount of woody tissue in the retroperitoneum in this area.  He also had extensive adhesions of the splenic flexure.  We excised the anastomosis which was sent to pathology for evaluation.  The splenic flexure had to be taken down and the colon was pulled out of the sidewall at the distal  transverse colectomy.  I removed the splenic flexure from this ostomy and send it to the pathology as well  Procedure: The patient was brought to the operating identifies correct patient.  He is placed upon the operating table and general anesthesia was induced.  His abdomen was prepped and draped in usual sterile fashion.  We created a midline incision with a scalpel and then dissected down through the subcutaneous tissue to the fascia.  We then opened the fascia and peritoneum the entire length of the incision.  The patient had a massively dilated colon from the cecum all the way to his sigmoid colon.  I took down adhesions from his previous ostomy site with the cautery and the Metzenbaum scissors and we were finally able to identify the previous bowel anastomosis.  This was then was fixated in the retroperitoneum with a significant amount of bleeding tissue and scar tissue.  There was no gross malignancy although this will be determined with final pathology.  We transected the sigmoid colon with a GIA 75 stapler just distal to the anastomosis.  We then slowly started taking down the mesentery with the LigaSure and the electrocautery.  We had to extend our incision further toward the head in order to expose the colon further is still remain massively dilated.  We created a colotomy and suctioned out a large amount of air and liquid stool from the colon.  We then closed this with silk sutures.  This enabled us  to better mobilize the colon.  The patient had extensive adhesions of the omentum at the splenic flexure.  After difficult dissection we were finally able to take down the splenic flexure with the cautery and the LigaSure.  I then mobilized the colon further toward the mid transverse colon.  At this point, we elected to place an NG tube as the small bowel was coming increasingly dilated with some of the area going back to the ileocecal valve into the small bowel from the large distended colon.  We next  irrigated the abdomen with copious amounts of normal saline.  I evaluated all 4 quadrants and hemostasis appeared to be achieved.  Hemostasis appeared to be achieved at the splenic flexure.  We then made an elliptical incision in the patient's left upper quadrant.  This was done with the cautery.  We took this down to the fascia which was then opened in a cruciate fashion.  The underlying muscle fibers were then bluntly opened and the peritoneum was then opened the cautery.  I then pulled out the splenic flexure and distal transverse colon through this opening as the colostomy.  The patient's midline fascia was then closed with running #1 looped PDS suture.  I reinforced the center portion with a figure-of-eight #1 Prolene suture.  We then irrigated the incision with saline and closed with skin staples.  I then excised the splenic flexure of the colostomy site and created a distal transverse colostomy.  The splenic flexure was sent to pathology separately for evaluation.  The colostomy was pink and well-perfused.  The ostomy was matured circumferentially with 3-0 Vicryl sutures.  An ostomy appliance was applied and gauze was applied to the midline incision.  The patient tolerated the procedure.  All the counts were correct at the end the procedure.  The patient was then extubated in the operating room and taken in stable condition to the recovery room.          Vicenta Poli   Date: 04/13/2023  Time: 1:42 PM

## 2023-04-13 NOTE — Progress Notes (Signed)
 Patient ID: Leon Nichols, male   DOB: 08/12/1965, 58 y.o.   MRN: [id number]   Pre Procedure note for inpatients:   Leon Nichols has been scheduled for a PARTIAL COLECTOMY WITH COLOSTOMY FOR A LARGE BOWEL OBSTRUCTION today.   Again, he is near completely obstructed either right at a right proximal to his colon anastomosis from his colostomy takedown.  Biopsies did not show malignancy and his CEA is normal. I explained the surgical procedure in detail. I discussed the risks which includes but are not limited to bleeding, infection, ostomy necrosis, injury to surrounding structures including the bowel, cardiopulmonary issues, DVT, etc. He agrees to proceed with surgery  The patient has been seen and labs reviewed. There are no changes in the patient's condition to prevent proceeding with the planned procedure today.  Recent labs:  Lab Results  Component Value Date   WBC 6.3 04/12/2023   HGB 14.0 04/12/2023   HCT 40.5 04/12/2023   PLT 213 04/12/2023   GLUCOSE 97 04/13/2023   CHOL 97 05/24/2022   TRIG 70 05/24/2022   HDL 43 05/24/2022   LDLCALC 39 05/24/2022   ALT 12 04/12/2023   AST 18 04/12/2023   NA 134 (L) 04/13/2023   K 2.6 (LL) 04/13/2023   CL 102 04/13/2023   CREATININE 0.51 (L) 04/13/2023   BUN 9 04/13/2023   CO2 22 04/13/2023   TSH 2.46 05/24/2022   PSA 0.48 05/24/2022   INR 1.1 09/02/2019   HGBA1C 6.2 (H) 03/23/2012    Vicenta Poli, MD 04/13/2023 7:49 AM

## 2023-04-13 NOTE — Anesthesia Procedure Notes (Addendum)
 Procedure Name: Intubation Date/Time: 04/13/2023 10:54 AM  Performed by: Erick Fitz, CRNAPre-anesthesia Checklist: Patient identified, Emergency Drugs available, Suction available, Patient being monitored and Timeout performed Patient Re-evaluated:Patient Re-evaluated prior to induction Oxygen Delivery Method: Circle system utilized Preoxygenation: Pre-oxygenation with 100% oxygen Induction Type: IV induction and Rapid sequence Laryngoscope Size: Mac and 4 Grade View: Grade II Tube type: Oral Tube size: 7.5 mm Number of attempts: 1 Airway Equipment and Method: Stylet Placement Confirmation: ETT inserted through vocal cords under direct vision, CO2 detector, positive ETCO2 and breath sounds checked- equal and bilateral Secured at: 23 cm Tube secured with: Tape Dental Injury: Teeth and Oropharynx as per pre-operative assessment

## 2023-04-13 NOTE — Anesthesia Preprocedure Evaluation (Addendum)
 Anesthesia Evaluation  Patient identified by MRN, date of birth, ID band Patient awake    Reviewed: Allergy & Precautions, NPO status , Patient's Chart, lab work & pertinent test results  Airway Mallampati: III  TM Distance: >3 FB Neck ROM: Full    Dental  (+) Teeth Intact, Dental Advisory Given   Pulmonary former smoker   Pulmonary exam normal breath sounds clear to auscultation       Cardiovascular hypertension, Pt. on medications + DVT  Normal cardiovascular exam Rhythm:Regular Rate:Normal     Neuro/Psych  Headaches  negative psych ROS   GI/Hepatic ,GERD  Medicated,,(+)     substance abuse  alcohol useColonic obstruction   Endo/Other  Obesity   Renal/GU negative Renal ROS     Musculoskeletal  (+) Arthritis ,    Abdominal   Peds  Hematology  (+) HIV  Anesthesia Other Findings Day of surgery medications reviewed with the patient.  Reproductive/Obstetrics                              Anesthesia Physical Anesthesia Plan  ASA: 3  Anesthesia Plan: General   Post-op Pain Management: Tylenol  PO (pre-op)*   Induction: Intravenous, Rapid sequence and Cricoid pressure planned  PONV Risk Score and Plan: 3 and Midazolam , Dexamethasone  and Ondansetron   Airway Management Planned: Oral ETT  Additional Equipment:   Intra-op Plan:   Post-operative Plan: Possible Post-op intubation/ventilation  Informed Consent: I have reviewed the patients History and Physical, chart, labs and discussed the procedure including the risks, benefits and alternatives for the proposed anesthesia with the patient or authorized representative who has indicated his/her understanding and acceptance.     Dental advisory given  Plan Discussed with: CRNA  Anesthesia Plan Comments: (2nd PIV)         Anesthesia Quick Evaluation

## 2023-04-13 NOTE — Consult Note (Signed)
 WOC team received consult for new colostomy.  Patient had colostomy from 2013 to 2024 when takedown occurred.  Due to large bowel obstruction new colostomy placed 04/13/2023 by Dr. Vernetta. WOC team will assess new ostomy and educational needs starting 04/14/2023.   Thank you,    Powell Bar MSN, RN-BC, [employer] [phone]

## 2023-04-13 NOTE — Transfer of Care (Signed)
 Immediate Anesthesia Transfer of Care Note  Patient: Leon Nichols  Procedure(s) Performed: EXPLORATORY LAPAROTOMY, COLON RESECTION, END COLOSTOMY  Patient Location: PACU  Anesthesia Type:General  Level of Consciousness: awake, alert , oriented, and patient cooperative  Airway & Oxygen Therapy: Patient Spontanous Breathing and Patient connected to face mask oxygen  Post-op Assessment: Report given to RN and Post -op Vital signs reviewed and stable  Post vital signs: Reviewed and stable  Last Vitals:  Vitals Value Taken Time  BP 146/100 04/13/23 1409  Temp    Pulse 76 04/13/23 1412  Resp 19 04/13/23 1412  SpO2 100 % 04/13/23 1412  Vitals shown include unfiled device data.  Last Pain:  Vitals:   04/13/23 0939  TempSrc:   PainSc: 0-No pain         Complications: No notable events documented.

## 2023-04-13 NOTE — Anesthesia Postprocedure Evaluation (Signed)
 Anesthesia Post Note  Patient: Leon Nichols  Procedure(s) Performed: EXPLORATORY LAPAROTOMY, COLON RESECTION, END COLOSTOMY     Patient location during evaluation: PACU Anesthesia Type: General Level of consciousness: awake and alert Pain management: pain level controlled Vital Signs Assessment: post-procedure vital signs reviewed and stable Respiratory status: spontaneous breathing, nonlabored ventilation, respiratory function stable and patient connected to nasal cannula oxygen Cardiovascular status: blood pressure returned to baseline and stable Postop Assessment: no apparent nausea or vomiting Anesthetic complications: no   No notable events documented.  Last Vitals:  Vitals:   04/13/23 1500 04/13/23 1515  BP: (!) 170/112 (!) 154/98  Pulse: 86 89  Resp: 11 (!) 21  Temp:    SpO2: 97% 98%    Last Pain:  Vitals:   04/13/23 1515  TempSrc:   PainSc: 6                  Garnette FORBES Skillern

## 2023-04-14 ENCOUNTER — Encounter (HOSPITAL_COMMUNITY): Payer: Self-pay | Admitting: Surgery

## 2023-04-14 DIAGNOSIS — K56609 Unspecified intestinal obstruction, unspecified as to partial versus complete obstruction: Secondary | ICD-10-CM | POA: Diagnosis not present

## 2023-04-14 LAB — CBC
HCT: 42.6 % (ref 39.0–52.0)
Hemoglobin: 14 g/dL (ref 13.0–17.0)
MCH: 30.8 pg (ref 26.0–34.0)
MCHC: 32.9 g/dL (ref 30.0–36.0)
MCV: 93.6 fL (ref 80.0–100.0)
Platelets: 225 10*3/uL (ref 150–400)
RBC: 4.55 MIL/uL (ref 4.22–5.81)
RDW: 13.3 % (ref 11.5–15.5)
WBC: 9.8 10*3/uL (ref 4.0–10.5)
nRBC: 0 % (ref 0.0–0.2)

## 2023-04-14 LAB — BASIC METABOLIC PANEL
Anion gap: 9 (ref 5–15)
BUN: 16 mg/dL (ref 6–20)
CO2: 23 mmol/L (ref 22–32)
Calcium: 7.9 mg/dL — ABNORMAL LOW (ref 8.9–10.3)
Chloride: 101 mmol/L (ref 98–111)
Creatinine, Ser: 1.34 mg/dL — ABNORMAL HIGH (ref 0.61–1.24)
GFR, Estimated: >60 mL/min (ref >60)
Glucose, Bld: 206 mg/dL — ABNORMAL HIGH (ref 70–99)
Potassium: 3.8 mmol/L (ref 3.5–5.1)
Sodium: 133 mmol/L — ABNORMAL LOW (ref 135–145)

## 2023-04-14 LAB — MAGNESIUM: Magnesium: 1.7 mg/dL (ref 1.7–2.4)

## 2023-04-14 MED ORDER — ENOXAPARIN SODIUM 60 MG/0.6ML IJ SOSY
55.0000 mg | PREFILLED_SYRINGE | INTRAMUSCULAR | Status: DC
  Administered 2023-04-14 – 2023-04-18 (×5): 55 mg via SUBCUTANEOUS
  Filled 2023-04-14 (×5): qty 0.6

## 2023-04-14 MED ORDER — SODIUM CHLORIDE 0.9 % IV SOLN: INTRAVENOUS | Status: DC

## 2023-04-14 NOTE — Progress Notes (Signed)
NG tube clamped at this time per MD order.

## 2023-04-14 NOTE — Evaluation (Signed)
 Physical Therapy Evaluation Patient Details Name: Leon Nichols MRN: [id number] DOB: 03/20/1966 Today's Date: 04/14/2023  History of Present Illness  58yo male who presented to the hospital on 04/09/23 with increasing abdominal distension and nausea. CT with severely dilated colon, ultimately admitted for bowel obstruction and received exploratory laparotomy, colon resection, and end colostomy on 04/13/23. PMH DVT s/p IVC filter, HIV, HTN, septic OA, knee arthoscopy, prior laparoscopic diverted colostomy  Clinical Impression   Pt received in bed, cooperative but anxious about mobility given multiple lines. O2 at 85-86% at rest on RA, replaced 2LPM O2 for remainder of session with sats then in the 90s. Mobilized well with RW, with main assist being for line management with gait/transfers. Left up in recliner with all needs met, RN aware of pt status. May benefit from skilled PT f/u as appropriate after DC from the hospital, but at the same time anticipate that he may be able to progress quickly- will update reccs as appropriate.         If plan is discharge home, recommend the following: A little help with walking and/or transfers;A little help with bathing/dressing/bathroom;Assistance with cooking/housework;Assist for transportation;Help with stairs or ramp for entrance   Can travel by private vehicle        Equipment Recommendations Rolling Storr (2 wheels);BSC/3in1  Recommendations for Other Services       Functional Status Assessment Patient has had a recent decline in their functional status and demonstrates the ability to make significant improvements in function in a reasonable and predictable amount of time.     Precautions / Restrictions Precautions Precautions: Other (comment) Precaution Comments: NG tube, colostomy, abdominal incision, watch sats Restrictions Weight Bearing Restrictions Per Provider Order: No      Mobility  Bed Mobility Overal bed mobility: Modified  Independent             General bed mobility comments: HOB elevated, use of rails    Transfers Overall transfer level: Needs assistance Equipment used: Rolling Njoku (2 wheels) Transfers: Sit to/from Stand Sit to Stand: Supervision           General transfer comment: S for safety, line management    Ambulation/Gait Ambulation/Gait assistance: Supervision Gait Distance (Feet): 120 Feet Assistive device: Rolling Chaviano (2 wheels) Gait Pattern/deviations: WFL(Within Functional Limits), Step-through pattern, Trunk flexed Gait velocity: decreased     General Gait Details: able to ambualte well with RW, primary assist given by PT for line management, likely to be able to progress away from AD with ongoing activity  Stairs            Wheelchair Mobility     Tilt Bed    Modified Rankin (Stroke Patients Only)       Balance Overall balance assessment: Mild deficits observed, not formally tested                                           Pertinent Vitals/Pain Pain Assessment Pain Assessment: Faces Faces Pain Scale: Hurts a little bit Pain Location: surgical site Pain Descriptors / Indicators: Discomfort Pain Intervention(s): Limited activity within patient's tolerance, Monitored during session, Repositioned    Home Living Family/patient expects to be discharged to:: Private residence Living Arrangements: Alone Available Help at Discharge: Family;Available PRN/intermittently Type of Home: House Home Access: Stairs to enter Entrance Stairs-Rails: Right Entrance Stairs-Number of Steps: 3   Home Layout: One  level Home Equipment: None      Prior Function Prior Level of Function : Independent/Modified Independent             Mobility Comments: fully independent, was working in animator work       Extremity/Trunk Assessment   Upper Extremity Assessment Upper Extremity Assessment: Overall WFL for tasks assessed    Lower  Extremity Assessment Lower Extremity Assessment: Overall WFL for tasks assessed    Cervical / Trunk Assessment Cervical / Trunk Assessment: Normal  Communication   Communication Communication: No apparent difficulties  Cognition Arousal: Alert Behavior During Therapy: WFL for tasks assessed/performed, Anxious Overall Cognitive Status: Within Functional Limits for tasks assessed                                 General Comments: pleasant and cooperative, anxious about multiple lines with mobility        General Comments      Exercises     Assessment/Plan    PT Assessment Patient needs continued PT services  PT Problem List Pain;Decreased activity tolerance;Decreased knowledge of use of DME;Decreased mobility       PT Treatment Interventions DME instruction;Balance training;Gait training;Stair training;Functional mobility training;Patient/family education;Therapeutic activities;Therapeutic exercise    PT Goals (Current goals can be found in the Care Plan section)  Acute Rehab PT Goals Patient Stated Goal: go home tomorrow if able PT Goal Formulation: With patient Time For Goal Achievement: 04/28/23 Potential to Achieve Goals: Good    Frequency Min 1X/week     Co-evaluation               AM-PAC PT 6 Clicks Mobility  Outcome Measure Help needed turning from your back to your side while in a flat bed without using bedrails?: None Help needed moving from lying on your back to sitting on the side of a flat bed without using bedrails?: A Little Help needed moving to and from a bed to a chair (including a wheelchair)?: A Little Help needed standing up from a chair using your arms (e.g., wheelchair or bedside chair)?: A Little Help needed to walk in hospital room?: A Little Help needed climbing 3-5 steps with a railing? : A Little 6 Click Score: 19    End of Session Equipment Utilized During Treatment: Oxygen Activity Tolerance: Patient tolerated  treatment well Patient left: in chair;with call bell/phone within reach Nurse Communication: Mobility status PT Visit Diagnosis: Difficulty in walking, not elsewhere classified (R26.2);Pain Pain - Right/Left:  (abdomen) Pain - part of body:  (abdomen)    Time: 8782-8756 PT Time Calculation (min) (ACUTE ONLY): 26 min   Charges:   PT Evaluation $PT Eval Moderate Complexity: 1 Mod PT Treatments $Gait Training: 8-22 mins PT General Charges $$ ACUTE PT VISIT: 1 Visit       Josette Rough, PT, DPT 04/14/23 12:56 PM

## 2023-04-14 NOTE — Progress Notes (Signed)
 1 Day Post-Op   Subjective/Chief Complaint: Less distended. Having abdominal pain but well controlled with PCA. No nausea. Has had some gas and stool out from colostomy  Objective: Vital signs in last 24 hours: Temp:  [97 F (36.1 C)-98.9 F (37.2 C)] 97.8 F (36.6 C) (01/09 0442) Pulse Rate:  [64-96] 78 (01/09 0615) Resp:  [11-27] 20 (01/09 0735) BP: (107-182)/(84-117) 116/91 (01/09 0615) SpO2:  [94 %-100 %] 95 % (01/09 0735) Weight:  [109.9 kg] 109.9 kg (01/08 0939) Last BM Date : 04/11/23  Intake/Output from previous day: 01/08 0701 - 01/09 0700 In: 3499.9 [P.O.:120; I.V.:2929.9; IV Piggyback:450] Out: 1335 [Urine:575; Emesis/NG output:300; Stool:50; Blood:410] Intake/Output this shift: No intake/output data recorded.  Exam: Awake and alert Abdomen with midline honeycomb in place with some blood. Colostomy with stoma pink and viable and air and stool in bag. NGT with scant bilious output in tubing. Mild appropriate TTP GU: foley with clear yellow urine   Lab Results:  Recent Labs    04/12/23 0529 04/14/23 0543  WBC 6.3 9.8  HGB 14.0 14.0  HCT 40.5 42.6  PLT 213 225   BMET Recent Labs    04/13/23 1436 04/14/23 0543  NA 137 133*  K 3.3* 3.8  CL 104 101  CO2 23 23  GLUCOSE 157* 206*  BUN 11 16  CREATININE 1.12 1.34*  CALCIUM  8.4* 7.9*   PT/INR No results for input(s): LABPROT, INR in the last 72 hours. ABG No results for input(s): PHART, HCO3 in the last 72 hours.  Invalid input(s): PCO2, PO2  Studies/Results: No results found.   Anti-infectives: Anti-infectives (From admission, onward)    Start     Dose/Rate Route Frequency Ordered Stop   04/13/23 2200  cefoTEtan  (CEFOTAN ) 2 g in sodium chloride  0.9 % 100 mL IVPB        2 g 200 mL/hr over 30 Minutes Intravenous Every 12 hours 04/13/23 1608     04/13/23 0700  cefoTEtan  (CEFOTAN ) 2 g in sodium chloride  0.9 % 100 mL IVPB       Note to Pharmacy: Preop tomorrow for surgery tomorrow    2 g 200 mL/hr over 30 Minutes Intravenous To [facility] 04/12/23 0846 04/14/23 0734       Assessment/Plan: Large Bowel Obstruction POD1 s/p ex lap partial colectomy, takedown splenic flexure, end colostomy  - pathology pending - having some stool output. Clamp NGT and allow clears - no reliable nutrition intake since last week. Will order PICC/TPN - foley out today - continue IV abx today - WOC RN consulted for new ostomy. Will need midline dressing changed with first ostomy change - encouraged ambulation  FEN: clamp NGT. Clears. TPN  ID: cefotan  1/8> VTE: lovenox     LOS: 5 days    [place] VEAR Mais, [facility] Surgery 04/14/2023, 7:42 AM Please see Amion for pager number during day hours 7:00am-4:30pm

## 2023-04-14 NOTE — Progress Notes (Signed)
   04/14/23 1556  TOC Brief Assessment  Insurance and Status Reviewed  Patient has primary care physician Yes ([facility])  Home environment has been reviewed Yes (home alone)  Prior level of function: Independent  Prior/Current Home Services No current home services  Social Drivers of Health Review SDOH reviewed no interventions necessary  Readmission risk has been reviewed Yes  Transition of care needs no transition of care needs at this time

## 2023-04-14 NOTE — Progress Notes (Signed)
 PROGRESS NOTE    Leon Nichols  FMW:[id number] DOB: Sep 03, 1965 DOA: 04/09/2023 PCP: [facility]   Brief Narrative:  58 y.o. male with medical history significant of back pain, history of colitis, diverticulosis, DVT, hemorrhoids, herpes zoster, HIV, hypertension, multiple episodes of perianal/perirectal abscess,  diverting loop descending colostomy in 2013 with subsequent reversal of the colostomy in July 2024 presented with worsening abdominal distention and nausea.  On presentation, imaging revealed a severely dilated colon with a transition point in the left hemipelvis just above the area of the suture line and colostomy takedown and anastomosis.  General surgery and GI were consulted.  He underwent flexible sigmoidoscopy by GI on 04/11/2023.  He underwent exploratory laparotomy, partial colectomy and distal transverse colostomy on 04/13/2023 by general surgery.  Assessment & Plan:   Large bowel obstruction -underwent flexible sigmoidoscopy by GI on 04/11/2023: Pathology pending. -underwent exploratory laparotomy, partial colectomy and distal transverse colostomy on 04/13/2023 by general surgery. -Follow further general surgery recommendations.  Wound care/diet advancement/pain management as per general surgery  HIV disease -Triumeq  on hold for now.  Hypertension -Blood pressure intermittently on the lower side.  Currently on IV enalapril and metoprolol .  Monitor  History of PE/DVT -History of IVC filter placement.  Lovenox  held for surgical intervention  Hyperlipidemia Lipitor on hold  Obesity -Outpatient follow-up  Hyponatremia -Mild.  Monitor  Hypokalemia -Resolved   DVT prophylaxis: Resume Lovenox  once cleared by general surgery  code Status: Full Family Communication: None at bedside Disposition Plan: Status is: Inpatient Remains inpatient appropriate because: Of severity of illness    Consultants: GI/general surgery  Procedures: As above  Antimicrobials:   Anti-infectives (From admission, onward)    Start     Dose/Rate Route Frequency Ordered Stop   04/13/23 2200  cefoTEtan  (CEFOTAN ) 2 g in sodium chloride  0.9 % 100 mL IVPB        2 g 200 mL/hr over 30 Minutes Intravenous Every 12 hours 04/13/23 1608     04/13/23 0700  cefoTEtan  (CEFOTAN ) 2 g in sodium chloride  0.9 % 100 mL IVPB       Note to Pharmacy: Preop tomorrow for surgery tomorrow   2 g 200 mL/hr over 30 Minutes Intravenous To [facility] 04/12/23 0846 04/13/23 1140        Subjective: Patient seen and examined at pressure.  Complains of intermittent abdominal pain.  No fever, shortness of breath, vomiting reported.  Objective: Vitals:   04/14/23 0007 04/14/23 0142 04/14/23 0442 04/14/23 0615  BP: 109/84 107/88 (!) 111/90 (!) 116/91  Pulse: 77 73 78 78  Resp: 19 16 20  (!) 21  Temp: (!) 97 F (36.1 C)  97.8 F (36.6 C)   TempSrc: Axillary  Oral   SpO2:  95% 95%   Weight:      Height:        Intake/Output Summary (Last 24 hours) at 04/14/2023 0720 Last data filed at 04/14/2023 0500 Gross per 24 hour  Intake 3499.9 ml  Output 1335 ml  Net 2164.9 ml   Filed Weights   04/09/23 0837 04/09/23 1807 04/13/23 0939  Weight: 111.1 kg 109.9 kg 109.9 kg    Examination:  General exam: Appears calm and comfortable.  On room air ENT: NGT present Respiratory system: Bilateral decreased breath sounds at bases with scattered crackles Cardiovascular system: S1 & S2 heard, Rate controlled Gastrointestinal system: Abdomen is distended, soft and tender.  Abdominal dressing present with colostomy  extremities: No cyanosis, clubbing, edema  Central nervous  system: Alert and oriented. No focal neurological deficits. Moving extremities Skin: No rashes, lesions or ulcers Psychiatry: Judgement and insight appear normal. Mood & affect appropriate.     Data Reviewed: I have personally reviewed following labs and imaging studies  CBC: Recent Labs  Lab 04/09/23 0846  04/11/23 0545 04/12/23 0529 04/14/23 0543  WBC 6.2 5.6 6.3 9.8  HGB 15.0 15.1 14.0 14.0  HCT 44.3 44.8 40.5 42.6  MCV 90.6 92.2 90.6 93.6  PLT 226 239 213 225   Basic Metabolic Panel: Recent Labs  Lab 04/09/23 0846 04/10/23 0959 04/11/23 0545 04/12/23 0529 04/13/23 0534 04/13/23 1436 04/14/23 0543  NA 138   < > 137 134* 134* 137 133*  K 2.7*   < > 3.1* 2.7* 2.6* 3.3* 3.8  CL 102   < > 104 100 102 104 101  CO2 27   < > 24 28 22 23 23   GLUCOSE 130*   < > 141* 119* 97 157* 206*  BUN 14   < > 9 11 9 11 16   CREATININE 0.72   < > 0.71 0.83 0.51* 1.12 1.34*  CALCIUM  9.2   < > 9.3 9.0 8.6* 8.4* 7.9*  MG 2.1  --  1.8 1.6* 1.8  --  1.7  PHOS 3.8  --   --   --   --   --   --    < > = values in this interval not displayed.   GFR: Estimated Creatinine Clearance: 81 mL/min (A) (by C-G formula based on SCr of 1.34 mg/dL (H)). Liver Function Tests: Recent Labs  Lab 04/09/23 0846 04/12/23 0529  AST 15 18  ALT 11 12  ALKPHOS 74 64  BILITOT 0.5 0.8  PROT 8.0 7.2  ALBUMIN  4.0 3.6   Recent Labs  Lab 04/09/23 0846  LIPASE 34   No results for input(s): AMMONIA in the last 168 hours. Coagulation Profile: No results for input(s): INR, PROTIME in the last 168 hours. Cardiac Enzymes: No results for input(s): CKTOTAL, CKMB, CKMBINDEX, TROPONINI in the last 168 hours. BNP (last 3 results) No results for input(s): PROBNP in the last 8760 hours. HbA1C: No results for input(s): HGBA1C in the last 72 hours. CBG: Recent Labs  Lab 04/09/23 2007  GLUCAP 126*   Lipid Profile: No results for input(s): CHOL, HDL, LDLCALC, TRIG, CHOLHDL, LDLDIRECT in the last 72 hours. Thyroid  Function Tests: No results for input(s): TSH, T4TOTAL, FREET4, T3FREE, THYROIDAB in the last 72 hours. Anemia Panel: No results for input(s): VITAMINB12, FOLATE, FERRITIN, TIBC, IRON, RETICCTPCT in the last 72 hours. Sepsis Labs: No results for input(s):  PROCALCITON, LATICACIDVEN in the last 168 hours.  No results found for this or any previous visit (from the past 240 hours).       Radiology Studies: No results found.      Scheduled Meds:  enalaprilat   1.25 mg Intravenous Q6H   HYDROmorphone    Intravenous Q4H   metoprolol  tartrate  5 mg Intravenous Q8H   pantoprazole  (PROTONIX ) IV  40 mg Intravenous Q24H   Continuous Infusions:  acetaminophen  1,000 mg (04/14/23 0610)   cefoTEtan  (CEFOTAN ) IV Stopped (04/13/23 2330)   dextrose  5 % and 0.45 % NaCl with KCl 10 mEq/L 125 mL/hr at 04/14/23 0139          Sophie Mao, MD [facility] 04/14/2023, 7:20 AM

## 2023-04-15 DIAGNOSIS — K56609 Unspecified intestinal obstruction, unspecified as to partial versus complete obstruction: Secondary | ICD-10-CM | POA: Diagnosis not present

## 2023-04-15 LAB — BASIC METABOLIC PANEL
Anion gap: 6 (ref 5–15)
BUN: 13 mg/dL (ref 6–20)
CO2: 27 mmol/L (ref 22–32)
Calcium: 8.1 mg/dL — ABNORMAL LOW (ref 8.9–10.3)
Chloride: 103 mmol/L (ref 98–111)
Creatinine, Ser: 0.88 mg/dL (ref 0.61–1.24)
GFR, Estimated: >60 mL/min (ref >60)
Glucose, Bld: 104 mg/dL — ABNORMAL HIGH (ref 70–99)
Potassium: 2.9 mmol/L — ABNORMAL LOW (ref 3.5–5.1)
Sodium: 136 mmol/L (ref 135–145)

## 2023-04-15 LAB — CBC
HCT: 38.3 % — ABNORMAL LOW (ref 39.0–52.0)
Hemoglobin: 12.8 g/dL — ABNORMAL LOW (ref 13.0–17.0)
MCH: 31.1 pg (ref 26.0–34.0)
MCHC: 33.4 g/dL (ref 30.0–36.0)
MCV: 93 fL (ref 80.0–100.0)
Platelets: 225 10*3/uL (ref 150–400)
RBC: 4.12 MIL/uL — ABNORMAL LOW (ref 4.22–5.81)
RDW: 13.5 % (ref 11.5–15.5)
WBC: 9.5 10*3/uL (ref 4.0–10.5)
nRBC: 0 % (ref 0.0–0.2)

## 2023-04-15 LAB — MAGNESIUM: Magnesium: 1.7 mg/dL (ref 1.7–2.4)

## 2023-04-15 MED ORDER — ENSURE ENLIVE PO LIQD
237.0000 mL | Freq: Two times a day (BID) | ORAL | Status: DC
  Administered 2023-04-15 – 2023-04-18 (×6): 237 mL via ORAL
  Filled 2023-04-15: qty 237

## 2023-04-15 MED ORDER — ACETAMINOPHEN 500 MG PO TABS
1000.0000 mg | ORAL_TABLET | Freq: Four times a day (QID) | ORAL | Status: DC
  Administered 2023-04-15 – 2023-04-18 (×11): 1000 mg via ORAL
  Filled 2023-04-15 (×11): qty 2

## 2023-04-15 MED ORDER — GUAIFENESIN 100 MG/5ML PO LIQD
5.0000 mL | ORAL | Status: DC | PRN
  Administered 2023-04-15: 5 mL via ORAL
  Filled 2023-04-15: qty 10

## 2023-04-15 MED ORDER — POTASSIUM CHLORIDE 20 MEQ PO PACK
40.0000 meq | PACK | Freq: Two times a day (BID) | ORAL | Status: AC
  Administered 2023-04-15 (×2): 40 meq via ORAL
  Filled 2023-04-15 (×2): qty 2

## 2023-04-15 MED ORDER — PANTOPRAZOLE SODIUM 40 MG PO TBEC
40.0000 mg | DELAYED_RELEASE_TABLET | Freq: Every day | ORAL | Status: DC
  Administered 2023-04-15 – 2023-04-18 (×4): 40 mg via ORAL
  Filled 2023-04-15 (×4): qty 1

## 2023-04-15 MED ORDER — MAGNESIUM SULFATE 2 GM/50ML IV SOLN
2.0000 g | Freq: Once | INTRAVENOUS | Status: AC
  Administered 2023-04-15: 2 g via INTRAVENOUS
  Filled 2023-04-15: qty 50

## 2023-04-15 MED ORDER — OXYCODONE HCL 5 MG PO TABS
5.0000 mg | ORAL_TABLET | ORAL | Status: DC | PRN
Start: 2023-04-15 — End: 2023-04-18
  Administered 2023-04-17 – 2023-04-18 (×5): 10 mg via ORAL
  Filled 2023-04-15 (×5): qty 2

## 2023-04-15 NOTE — Progress Notes (Signed)
 PROGRESS NOTE    Leon Nichols  FMW:[id number] DOB: 12-Dec-1965 DOA: 04/09/2023 PCP: [facility]   Brief Narrative:  58 y.o. male with medical history significant of back pain, history of colitis, diverticulosis, DVT, hemorrhoids, herpes zoster, HIV, hypertension, multiple episodes of perianal/perirectal abscess,  diverting loop descending colostomy in 2013 with subsequent reversal of the colostomy in July 2024 presented with worsening abdominal distention and nausea.  On presentation, imaging revealed a severely dilated colon with a transition point in the left hemipelvis just above the area of the suture line and colostomy takedown and anastomosis.  General surgery and GI were consulted.  He underwent flexible sigmoidoscopy by GI on 04/11/2023.  He underwent exploratory laparotomy, partial colectomy and distal transverse colostomy on 04/13/2023 by general surgery.  Assessment & Plan:   Large bowel obstruction -underwent flexible sigmoidoscopy by GI on 04/11/2023: Pathology pending. -underwent exploratory laparotomy, partial colectomy and distal transverse colostomy on 04/13/2023 by general surgery. -Follow further general surgery recommendations.  Wound care/diet advancement/pain management as per general surgery. NG tube has been removed  Currently on clear liquid diet.  HIV disease -Triumeq  on hold for now.  Resume once able to tolerate oral diet.  Hypertension -Blood pressure intermittently on the lower side.  DC IV enalapril and metoprolol .  Resume as needed   History of PE/DVT -History of IVC filter placement.    Hyperlipidemia -Lipitor on hold  Obesity -Outpatient follow-up  Hyponatremia -Improved  Hypokalemia -Replace.  Repeat a.m. labs   DVT prophylaxis: Lovenox  code Status: Full Family Communication: None at bedside Disposition Plan: Status is: Inpatient Remains inpatient appropriate because: Of severity of illness    Consultants: GI/general  surgery  Procedures: As above  Antimicrobials:  Anti-infectives (From admission, onward)    Start     Dose/Rate Route Frequency Ordered Stop   04/13/23 2200  cefoTEtan  (CEFOTAN ) 2 g in sodium chloride  0.9 % 100 mL IVPB        2 g 200 mL/hr over 30 Minutes Intravenous Every 12 hours 04/13/23 1608     04/13/23 0700  cefoTEtan  (CEFOTAN ) 2 g in sodium chloride  0.9 % 100 mL IVPB       Note to Pharmacy: Preop tomorrow for surgery tomorrow   2 g 200 mL/hr over 30 Minutes Intravenous To [facility] 04/12/23 0846 04/14/23 0734        Subjective: Patient seen and examined at pressure.  No fever, vomiting, chest pain reported.  Still having intermittent abdominal pain. Objective: Vitals:   04/14/23 2059 04/15/23 0008 04/15/23 0304 04/15/23 0449  BP:    (!) 122/93  Pulse:    99  Resp: 16 18 19 18   Temp:    98.2 F (36.8 C)  TempSrc:    Oral  SpO2: 97% 98% 97% 98%  Weight:      Height:        Intake/Output Summary (Last 24 hours) at 04/15/2023 0744 Last data filed at 04/15/2023 0300 Gross per 24 hour  Intake 1141.27 ml  Output 600 ml  Net 541.27 ml   Filed Weights   04/09/23 0837 04/09/23 1807 04/13/23 0939  Weight: 111.1 kg 109.9 kg 109.9 kg    Examination:  General: Currently on room air.  No distress ENT/neck: No thyromegaly.  JVD is not elevated.  NGT has been removed respiratory: Decreased breath sounds at bases bilaterally with some crackles; no wheezing  CVS: S1-S2 heard, rate controlled currently Abdominal: Soft, mild tender, slightly distended; no organomegaly, abdominal dressing present  with colostomy  extremities: Trace lower extremity edema; no cyanosis  CNS: Awake and alert.  No focal neurologic deficit.  Moves extremities Lymph: No obvious lymphadenopathy Skin: No obvious ecchymosis/lesions  psych: Affect, judgment and mood are normal  musculoskeletal: No obvious joint swelling/deformity     Data Reviewed: I have personally reviewed following  labs and imaging studies  CBC: Recent Labs  Lab 04/09/23 0846 04/11/23 0545 04/12/23 0529 04/14/23 0543 04/15/23 0555  WBC 6.2 5.6 6.3 9.8 9.5  HGB 15.0 15.1 14.0 14.0 12.8*  HCT 44.3 44.8 40.5 42.6 38.3*  MCV 90.6 92.2 90.6 93.6 93.0  PLT 226 239 213 225 225   Basic Metabolic Panel: Recent Labs  Lab 04/09/23 0846 04/10/23 0959 04/11/23 0545 04/12/23 0529 04/13/23 0534 04/13/23 1436 04/14/23 0543 04/15/23 0555  NA 138   < > 137 134* 134* 137 133* 136  K 2.7*   < > 3.1* 2.7* 2.6* 3.3* 3.8 2.9*  CL 102   < > 104 100 102 104 101 103  CO2 27   < > 24 28 22 23 23 27   GLUCOSE 130*   < > 141* 119* 97 157* 206* 104*  BUN 14   < > 9 11 9 11 16 13   CREATININE 0.72   < > 0.71 0.83 0.51* 1.12 1.34* 0.88  CALCIUM  9.2   < > 9.3 9.0 8.6* 8.4* 7.9* 8.1*  MG 2.1  --  1.8 1.6* 1.8  --  1.7 1.7  PHOS 3.8  --   --   --   --   --   --   --    < > = values in this interval not displayed.   GFR: Estimated Creatinine Clearance: 123.3 mL/min (by C-G formula based on SCr of 0.88 mg/dL). Liver Function Tests: Recent Labs  Lab 04/09/23 0846 04/12/23 0529  AST 15 18  ALT 11 12  ALKPHOS 74 64  BILITOT 0.5 0.8  PROT 8.0 7.2  ALBUMIN  4.0 3.6   Recent Labs  Lab 04/09/23 0846  LIPASE 34   No results for input(s): AMMONIA in the last 168 hours. Coagulation Profile: No results for input(s): INR, PROTIME in the last 168 hours. Cardiac Enzymes: No results for input(s): CKTOTAL, CKMB, CKMBINDEX, TROPONINI in the last 168 hours. BNP (last 3 results) No results for input(s): PROBNP in the last 8760 hours. HbA1C: No results for input(s): HGBA1C in the last 72 hours. CBG: Recent Labs  Lab 04/09/23 2007  GLUCAP 126*   Lipid Profile: No results for input(s): CHOL, HDL, LDLCALC, TRIG, CHOLHDL, LDLDIRECT in the last 72 hours. Thyroid  Function Tests: No results for input(s): TSH, T4TOTAL, FREET4, T3FREE, THYROIDAB in the last 72 hours. Anemia  Panel: No results for input(s): VITAMINB12, FOLATE, FERRITIN, TIBC, IRON, RETICCTPCT in the last 72 hours. Sepsis Labs: No results for input(s): PROCALCITON, LATICACIDVEN in the last 168 hours.  No results found for this or any previous visit (from the past 240 hours).       Radiology Studies: No results found.      Scheduled Meds:  enalaprilat   1.25 mg Intravenous Q6H   enoxaparin  (LOVENOX ) injection  55 mg Subcutaneous Q24H   HYDROmorphone    Intravenous Q4H   metoprolol  tartrate  5 mg Intravenous Q8H   pantoprazole  (PROTONIX ) IV  40 mg Intravenous Q24H   potassium chloride   40 mEq Oral BID   Continuous Infusions:  sodium chloride  75 mL/hr at 04/14/23 0824   cefoTEtan  (CEFOTAN ) IV 2 g (04/14/23  2142)   magnesium  sulfate bolus IVPB            Sophie Mao, MD [facility] 04/15/2023, 7:44 AM

## 2023-04-15 NOTE — Consult Note (Addendum)
 WOC Nurse ostomy consult note Stoma type/location:  transverse colostomy LMQ  Stomal assessment/size: approximately 3 pink moist well budded, edematous, small amount of necrotic brown tissue at 12 o'clock  Peristomal assessment: intact, midline incision to right w/staples, does not interfere with pouching  Treatment options for stomal/peristomal skin: applied (2) 2 barrier rings around stoma  Output approximately 200 mls liquid brown effluent  Ostomy pouching: placed in a 4 ostomy kit, ostomy is too large at this time for any other pouch to fit  Education provided: Patient is very knowledgeable and independent in ostomy care however his previous colostomy was not this large and he was able to wear a flexible 1 piece which is what he prefers.  We discussed that his stoma is very edematous at this time and we will secure with a 4 pouch however hopefully edema will subside enough to get into a regular pouch prior to discharge.   I placed (2) additional 4 ostomy kits in room [place] [phone]).    WOC team will continue to follow for ostomy support. I did provide educational booklet, flyer for ostomy clinic and [employer]. Patient states he has a company that he orders from but may be interested in changing.    Thank you,    Powell Bar MSN, RN-BC, CWOCN [phone]  Enrolled patient in [place] Secure Start DC program: NO NOT AT THIS TIME

## 2023-04-15 NOTE — Progress Notes (Signed)
 PT Cancellation Note  Patient Details Name: SINCLAIR ALLIGOOD MRN: [id number] DOB: [birth year]   Cancelled Treatment:    Reason Eval/Treat Not Completed: Pain limiting ability to participate (pt reported his pain is severe and he cannot tolerate any activity today. Pt is using PCA. Will follow.)   Sylvan Delon Copp PT 04/15/2023  Acute Rehabilitation Services  Office [phone]

## 2023-04-15 NOTE — Plan of Care (Signed)
  Problem: Education: Goal: Knowledge of General Education information will improve Description: Including pain rating scale, medication(s)/side effects and non-pharmacologic comfort measures Outcome: Progressing   Problem: Health Behavior/Discharge Planning: Goal: Ability to manage health-related needs will improve Outcome: Not Progressing   Problem: Clinical Measurements: Goal: Ability to maintain clinical measurements within normal limits will improve Outcome: Progressing Goal: Will remain free from infection Outcome: Progressing Goal: Diagnostic test results will improve Outcome: Progressing Goal: Respiratory complications will improve Outcome: Progressing Goal: Cardiovascular complication will be avoided Outcome: Progressing   Problem: Nutrition: Goal: Adequate nutrition will be maintained Outcome: Progressing   Problem: Coping: Goal: Level of anxiety will decrease Outcome: Progressing   Problem: Elimination: Goal: Will not experience complications related to bowel motility Outcome: Progressing Goal: Will not experience complications related to urinary retention Outcome: Progressing   Problem: Pain Management: Goal: General experience of comfort will improve Outcome: Progressing   Problem: Safety: Goal: Ability to remain free from injury will improve Outcome: Progressing   Problem: Skin Integrity: Goal: Risk for impaired skin integrity will decrease Outcome: Progressing

## 2023-04-15 NOTE — Progress Notes (Signed)
 Unable to replace the honeycomb dressing as it is currently out of stock. The RN contacted other units to obtain the honeycomb dressing, but no success. The charge nurse has placed an order for the dressing. In the meantime, an ABD dressing was applied to the surgical site as a temporary measure until the honeycomb dressing becomes available.

## 2023-04-15 NOTE — Progress Notes (Addendum)
 2 Days Post-Op   Subjective/Chief Complaint: Tolerated clears well yesterday without n/v/abdominal pain. Pain well controlled with PCA. Having ostomy output. Has been oob  Objective: Vital signs in last 24 hours: Temp:  [97.9 F (36.6 C)-98.3 F (36.8 C)] 98.2 F (36.8 C) (01/10 0449) Pulse Rate:  [87-99] 99 (01/10 0449) Resp:  [16-20] 18 (01/10 0449) BP: (87-126)/(66-93) 122/93 (01/10 0449) SpO2:  [93 %-98 %] 98 % (01/10 0449) Last BM Date : 04/14/23  Intake/Output from previous day: 01/09 0701 - 01/10 0700 In: 1141.3 [P.O.:120; I.V.:912.1; IV Piggyback:109.2] Out: 600 [Urine:250; Stool:350] Intake/Output this shift: No intake/output data recorded.  Exam: Awake and alert Abdomen with midline dressing cdi - staples below without drainage or surrounding erythema. Colostomy with stoma pink and viable and air and stool in bag. Mild appropriate TTP  Lab Results:  Recent Labs    04/14/23 0543 04/15/23 0555  WBC 9.8 9.5  HGB 14.0 12.8*  HCT 42.6 38.3*  PLT 225 225   BMET Recent Labs    04/14/23 0543 04/15/23 0555  NA 133* 136  K 3.8 2.9*  CL 101 103  CO2 23 27  GLUCOSE 206* 104*  BUN 16 13  CREATININE 1.34* 0.88  CALCIUM  7.9* 8.1*   PT/INR No results for input(s): LABPROT, INR in the last 72 hours. ABG No results for input(s): PHART, HCO3 in the last 72 hours.  Invalid input(s): PCO2, PO2  Studies/Results: No results found.   Anti-infectives: Anti-infectives (From admission, onward)    Start     Dose/Rate Route Frequency Ordered Stop   04/13/23 2200  cefoTEtan  (CEFOTAN ) 2 g in sodium chloride  0.9 % 100 mL IVPB        2 g 200 mL/hr over 30 Minutes Intravenous Every 12 hours 04/13/23 1608     04/13/23 0700  cefoTEtan  (CEFOTAN ) 2 g in sodium chloride  0.9 % 100 mL IVPB       Note to Pharmacy: Preop tomorrow for surgery tomorrow   2 g 200 mL/hr over 30 Minutes Intravenous To [facility] 04/12/23 0846 04/14/23 0734        Assessment/Plan: Large Bowel Obstruction POD2 s/p ex lap partial colectomy, takedown splenic flexure, end colostomy  - pathology pending - +BF. Advance to fulls - hypokalemia - replete K and supplement mag - continue IV abx today - d/c PCA 1/11 - WOC RN consulted for new ostomy - encouraged ambulation  FEN: FLD, ensure ID: cefotan  1/8> VTE: lovenox     LOS: 6 days    [place] VEAR Mais, [facility] Surgery 04/15/2023, 7:30 AM Please see Amion for pager number during day hours 7:00am-4:30pm

## 2023-04-16 DIAGNOSIS — K56609 Unspecified intestinal obstruction, unspecified as to partial versus complete obstruction: Secondary | ICD-10-CM | POA: Diagnosis not present

## 2023-04-16 LAB — BASIC METABOLIC PANEL
Anion gap: 9 (ref 5–15)
BUN: 10 mg/dL (ref 6–20)
CO2: 28 mmol/L (ref 22–32)
Calcium: 8.8 mg/dL — ABNORMAL LOW (ref 8.9–10.3)
Chloride: 101 mmol/L (ref 98–111)
Creatinine, Ser: 0.71 mg/dL (ref 0.61–1.24)
GFR, Estimated: >60 mL/min (ref >60)
Glucose, Bld: 141 mg/dL — ABNORMAL HIGH (ref 70–99)
Potassium: 3.2 mmol/L — ABNORMAL LOW (ref 3.5–5.1)
Sodium: 138 mmol/L (ref 135–145)

## 2023-04-16 LAB — CBC
HCT: 41.3 % (ref 39.0–52.0)
Hemoglobin: 13.8 g/dL (ref 13.0–17.0)
MCH: 30.9 pg (ref 26.0–34.0)
MCHC: 33.4 g/dL (ref 30.0–36.0)
MCV: 92.6 fL (ref 80.0–100.0)
Platelets: 266 10*3/uL (ref 150–400)
RBC: 4.46 MIL/uL (ref 4.22–5.81)
RDW: 13.7 % (ref 11.5–15.5)
WBC: 10.6 10*3/uL — ABNORMAL HIGH (ref 4.0–10.5)
nRBC: 0 % (ref 0.0–0.2)

## 2023-04-16 LAB — MAGNESIUM: Magnesium: 2 mg/dL (ref 1.7–2.4)

## 2023-04-16 MED ORDER — AMLODIPINE BESYLATE 10 MG PO TABS
10.0000 mg | ORAL_TABLET | Freq: Every day | ORAL | Status: DC
  Administered 2023-04-16 – 2023-04-18 (×3): 10 mg via ORAL
  Filled 2023-04-16 (×3): qty 1

## 2023-04-16 MED ORDER — POTASSIUM CHLORIDE 20 MEQ PO PACK
40.0000 meq | PACK | Freq: Two times a day (BID) | ORAL | Status: AC
  Administered 2023-04-16 (×2): 40 meq via ORAL
  Filled 2023-04-16 (×2): qty 2

## 2023-04-16 MED ORDER — ABACAVIR-DOLUTEGRAVIR-LAMIVUD 600-50-300 MG PO TABS
1.0000 | ORAL_TABLET | Freq: Every day | ORAL | Status: DC
  Administered 2023-04-16 – 2023-04-18 (×3): 1 via ORAL
  Filled 2023-04-16 (×3): qty 1

## 2023-04-16 MED ORDER — LISINOPRIL 20 MG PO TABS
20.0000 mg | ORAL_TABLET | Freq: Every day | ORAL | Status: DC
  Administered 2023-04-17 – 2023-04-18 (×2): 20 mg via ORAL
  Filled 2023-04-16 (×2): qty 1

## 2023-04-16 MED ORDER — HYDROMORPHONE HCL 1 MG/ML IJ SOLN
0.5000 mg | INTRAMUSCULAR | Status: DC | PRN
  Administered 2023-04-16 – 2023-04-17 (×2): 0.5 mg via INTRAVENOUS
  Filled 2023-04-16 (×3): qty 0.5

## 2023-04-16 NOTE — Progress Notes (Signed)
 3 Days Post-Op   Subjective/Chief Complaint: Tolerating full liquids. Colostomy productive.  Objective: Vital signs in last 24 hours: Temp:  [97.7 F (36.5 C)-98.2 F (36.8 C)] 98 F (36.7 C) (01/11 0533) Pulse Rate:  [98-103] 103 (01/11 0533) Resp:  [16-24] 18 (01/11 0533) BP: (136-145)/(92-100) 136/92 (01/11 0533) SpO2:  [94 %-97 %] 96 % (01/11 0533) Last BM Date : 04/15/23  Intake/Output from previous day: 01/10 0701 - 01/11 0700 In: 590 [P.O.:240; IV Piggyback:350] Out: 1050 [Urine:700; Stool:350] Intake/Output this shift: No intake/output data recorded.  Exam: Awake and alert, NAD Neuro: alert and oriented Abdomen:  Midline incision clean and dry with staples in place. LUQ edematous, pink and productive of stool. Abdomen soft and nondistended.  Lab Results:  Recent Labs    04/14/23 0543 04/15/23 0555  WBC 9.8 9.5  HGB 14.0 12.8*  HCT 42.6 38.3*  PLT 225 225   BMET Recent Labs    04/14/23 0543 04/15/23 0555  NA 133* 136  K 3.8 2.9*  CL 101 103  CO2 23 27  GLUCOSE 206* 104*  BUN 16 13  CREATININE 1.34* 0.88  CALCIUM  7.9* 8.1*   PT/INR No results for input(s): LABPROT, INR in the last 72 hours. ABG No results for input(s): PHART, HCO3 in the last 72 hours.  Invalid input(s): PCO2, PO2  Studies/Results: No results found.     Assessment/Plan: Large Bowel Obstruction POD3 s/p ex lap partial colectomy, takedown splenic flexure, end colostomy  - pathology pending - Advance to soft diet - Discontinue PCA. PRN oxycodone  and dilaudid  for pain. Scheduled tylenol , prn robaxin . - Stop antibiotics - WOC RN consulted for new ostomy - Ambulate - VTE: lovenox , SCDs    LOS: 7 days    Leonor LITTIE Dawn, MD [facility] Surgery 04/16/2023, 8:11 AM Please see Amion for pager number during day hours 7:00am-4:30pm

## 2023-04-16 NOTE — Progress Notes (Signed)
 Mobility Specialist - Progress Note  Pre-mobility: 112 bpm HR,  During mobility: 126 bpm HR,  Post-mobility: 121 bpm HR,    04/16/23 1454  Mobility  Activity Ambulated independently in hallway  Level of Assistance Standby assist, set-up cues, supervision of patient - no hands on  Assistive Device None  Distance Ambulated (ft) 500 ft  Range of Motion/Exercises Active  Activity Response Tolerated well  Mobility Referral Yes  Mobility visit 1 Mobility  Mobility Specialist Start Time (ACUTE ONLY) 1445  Mobility Specialist Stop Time (ACUTE ONLY) 1454  Mobility Specialist Time Calculation (min) (ACUTE ONLY) 9 min   Pt was found in bed and agreeable to ambulate. No complaints with session. At EOS returned to bathroom with all needs met. NT made aware.  Erminio Leos Mobility Specialist

## 2023-04-16 NOTE — Progress Notes (Signed)
 PROGRESS NOTE    Leon Nichols  FMW:[id number] DOB: [birth year] DOA: 04/09/2023 PCP: [facility]   Brief Narrative:  58 y.o. male with medical history significant of back pain, history of colitis, diverticulosis, DVT, hemorrhoids, herpes zoster, HIV, hypertension, multiple episodes of perianal/perirectal abscess,  diverting loop descending colostomy in 2013 with subsequent reversal of the colostomy in July 2024 presented with worsening abdominal distention and nausea.  On presentation, imaging revealed a severely dilated colon with a transition point in the left hemipelvis just above the area of the suture line and colostomy takedown and anastomosis.  General surgery and GI were consulted.  He underwent flexible sigmoidoscopy by GI on 04/11/2023.  He underwent exploratory laparotomy, partial colectomy and distal transverse colostomy on 04/13/2023 by general surgery.  Assessment & Plan:   Large bowel obstruction -underwent flexible sigmoidoscopy by GI on 04/11/2023: Pathology pending. -underwent exploratory laparotomy, partial colectomy and distal transverse colostomy on 04/13/2023 by general surgery. -Follow further general surgery recommendations.  Wound care/diet advancement/pain management as per general surgery. NG tube has been removed  Currently on full liquid diet.  HIV disease -Triumeq  on hold for now.  Resume once able to tolerate solid oral diet.  Hypertension -Blood pressure intermittently on the higher side.  Resume home regimen.  History of PE/DVT -History of IVC filter placement.    Hyperlipidemia -Lipitor on hold  Obesity -Outpatient follow-up  Hyponatremia -Improved  Hypokalemia -Labs pending for today  DVT prophylaxis: Lovenox  code Status: Full Family Communication: None at bedside Disposition Plan: Status is: Inpatient Remains inpatient appropriate because: Of severity of illness    Consultants: GI/general surgery  Procedures: As above  Antimicrobials:   Anti-infectives (From admission, onward)    Start     Dose/Rate Route Frequency Ordered Stop   04/13/23 2200  cefoTEtan  (CEFOTAN ) 2 g in sodium chloride  0.9 % 100 mL IVPB        2 g 200 mL/hr over 30 Minutes Intravenous Every 12 hours 04/13/23 1608     04/13/23 0700  cefoTEtan  (CEFOTAN ) 2 g in sodium chloride  0.9 % 100 mL IVPB       Note to Pharmacy: Preop tomorrow for surgery tomorrow   2 g 200 mL/hr over 30 Minutes Intravenous To [facility] 04/12/23 0846 04/14/23 0734        Subjective: Patient seen and examined at pressure.  Tolerating full liquid diet.  Having intermittent abdominal pain.  No chest pain, shortness of breath reported.   Objective: Vitals:   04/15/23 2338 04/16/23 0055 04/16/23 0533 04/16/23 0533  BP: (!) 142/100   (!) 136/92  Pulse: 98   (!) 103  Resp: (!) 21 (!) 22 (!) 24 18  Temp: 97.7 F (36.5 C)   98 F (36.7 C)  TempSrc: Oral   Oral  SpO2: 94% 95% 94% 96%  Weight:      Height:        Intake/Output Summary (Last 24 hours) at 04/16/2023 0716 Last data filed at 04/16/2023 0055 Gross per 24 hour  Intake 590 ml  Output 1050 ml  Net -460 ml   Filed Weights   04/09/23 0837 04/09/23 1807 04/13/23 0939  Weight: 111.1 kg 109.9 kg 109.9 kg    Examination:  General: Currently on room air.  No acute distress.  respiratory: Bilateral decreased breath sounds at bases bilaterally with scattered crackles CVS: Mostly rate controlled; S1-S2 heard  abdominal: Soft, mildly tender and distended, no organomegaly; normal bowel sounds are heard.  Abdominal dressing  present with colostomy extremities: No clubbing; mild lower extremity edema present      Data Reviewed: I have personally reviewed following labs and imaging studies  CBC: Recent Labs  Lab 04/09/23 0846 04/11/23 0545 04/12/23 0529 04/14/23 0543 04/15/23 0555  WBC 6.2 5.6 6.3 9.8 9.5  HGB 15.0 15.1 14.0 14.0 12.8*  HCT 44.3 44.8 40.5 42.6 38.3*  MCV 90.6 92.2 90.6 93.6 93.0  PLT  226 239 213 225 225   Basic Metabolic Panel: Recent Labs  Lab 04/09/23 0846 04/10/23 0959 04/11/23 0545 04/12/23 0529 04/13/23 0534 04/13/23 1436 04/14/23 0543 04/15/23 0555  NA 138   < > 137 134* 134* 137 133* 136  K 2.7*   < > 3.1* 2.7* 2.6* 3.3* 3.8 2.9*  CL 102   < > 104 100 102 104 101 103  CO2 27   < > 24 28 22 23 23 27   GLUCOSE 130*   < > 141* 119* 97 157* 206* 104*  BUN 14   < > 9 11 9 11 16 13   CREATININE 0.72   < > 0.71 0.83 0.51* 1.12 1.34* 0.88  CALCIUM  9.2   < > 9.3 9.0 8.6* 8.4* 7.9* 8.1*  MG 2.1  --  1.8 1.6* 1.8  --  1.7 1.7  PHOS 3.8  --   --   --   --   --   --   --    < > = values in this interval not displayed.   GFR: Estimated Creatinine Clearance: 123.3 mL/min (by C-G formula based on SCr of 0.88 mg/dL). Liver Function Tests: Recent Labs  Lab 04/09/23 0846 04/12/23 0529  AST 15 18  ALT 11 12  ALKPHOS 74 64  BILITOT 0.5 0.8  PROT 8.0 7.2  ALBUMIN  4.0 3.6   Recent Labs  Lab 04/09/23 0846  LIPASE 34   No results for input(s): AMMONIA in the last 168 hours. Coagulation Profile: No results for input(s): INR, PROTIME in the last 168 hours. Cardiac Enzymes: No results for input(s): CKTOTAL, CKMB, CKMBINDEX, TROPONINI in the last 168 hours. BNP (last 3 results) No results for input(s): PROBNP in the last 8760 hours. HbA1C: No results for input(s): HGBA1C in the last 72 hours. CBG: Recent Labs  Lab 04/09/23 2007  GLUCAP 126*   Lipid Profile: No results for input(s): CHOL, HDL, LDLCALC, TRIG, CHOLHDL, LDLDIRECT in the last 72 hours. Thyroid  Function Tests: No results for input(s): TSH, T4TOTAL, FREET4, T3FREE, THYROIDAB in the last 72 hours. Anemia Panel: No results for input(s): VITAMINB12, FOLATE, FERRITIN, TIBC, IRON, RETICCTPCT in the last 72 hours. Sepsis Labs: No results for input(s): PROCALCITON, LATICACIDVEN in the last 168 hours.  No results found for this or any previous  visit (from the past 240 hours).       Radiology Studies: No results found.      Scheduled Meds:  acetaminophen   1,000 mg Oral Q6H   enoxaparin  (LOVENOX ) injection  55 mg Subcutaneous Q24H   feeding supplement  237 mL Oral BID BM   HYDROmorphone    Intravenous Q4H   pantoprazole   40 mg Oral Daily   Continuous Infusions:  sodium chloride  50 mL/hr at 04/15/23 1619   cefoTEtan  (CEFOTAN ) IV 2 g (04/15/23 2134)          Sophie Mao, MD [facility] 04/16/2023, 7:16 AM

## 2023-04-16 NOTE — Plan of Care (Signed)

## 2023-04-17 DIAGNOSIS — K56609 Unspecified intestinal obstruction, unspecified as to partial versus complete obstruction: Secondary | ICD-10-CM | POA: Diagnosis not present

## 2023-04-17 LAB — BASIC METABOLIC PANEL
Anion gap: 8 (ref 5–15)
BUN: 10 mg/dL (ref 6–20)
CO2: 31 mmol/L (ref 22–32)
Calcium: 8.7 mg/dL — ABNORMAL LOW (ref 8.9–10.3)
Chloride: 99 mmol/L (ref 98–111)
Creatinine, Ser: 0.73 mg/dL (ref 0.61–1.24)
GFR, Estimated: >60 mL/min (ref >60)
Glucose, Bld: 118 mg/dL — ABNORMAL HIGH (ref 70–99)
Potassium: 2.9 mmol/L — ABNORMAL LOW (ref 3.5–5.1)
Sodium: 138 mmol/L (ref 135–145)

## 2023-04-17 LAB — CBC WITH DIFFERENTIAL/PLATELET
Abs Immature Granulocytes: 0.04 10*3/uL (ref 0.00–0.07)
Basophils Absolute: 0 10*3/uL (ref 0.0–0.1)
Basophils Relative: 0 %
Eosinophils Absolute: 0.3 10*3/uL (ref 0.0–0.5)
Eosinophils Relative: 3 %
HCT: 35.3 % — ABNORMAL LOW (ref 39.0–52.0)
Hemoglobin: 11.9 g/dL — ABNORMAL LOW (ref 13.0–17.0)
Immature Granulocytes: 1 %
Lymphocytes Relative: 28 %
Lymphs Abs: 2.2 10*3/uL (ref 0.7–4.0)
MCH: 31.1 pg (ref 26.0–34.0)
MCHC: 33.7 g/dL (ref 30.0–36.0)
MCV: 92.2 fL (ref 80.0–100.0)
Monocytes Absolute: 0.7 10*3/uL (ref 0.1–1.0)
Monocytes Relative: 9 %
Neutro Abs: 4.6 10*3/uL (ref 1.7–7.7)
Neutrophils Relative %: 59 %
Platelets: 260 10*3/uL (ref 150–400)
RBC: 3.83 MIL/uL — ABNORMAL LOW (ref 4.22–5.81)
RDW: 14.2 % (ref 11.5–15.5)
WBC: 7.7 10*3/uL (ref 4.0–10.5)
nRBC: 0 % (ref 0.0–0.2)

## 2023-04-17 LAB — MAGNESIUM: Magnesium: 1.9 mg/dL (ref 1.7–2.4)

## 2023-04-17 MED ORDER — METHOCARBAMOL 500 MG PO TABS: 500.0000 mg | ORAL_TABLET | Freq: Four times a day (QID) | ORAL | Status: DC | PRN

## 2023-04-17 MED ORDER — POTASSIUM CHLORIDE 20 MEQ PO PACK
40.0000 meq | PACK | Freq: Two times a day (BID) | ORAL | Status: AC
  Administered 2023-04-17 (×2): 40 meq via ORAL
  Filled 2023-04-17 (×2): qty 2

## 2023-04-17 NOTE — Progress Notes (Signed)
 4 Days Post-Op   Subjective/Chief Complaint: Tolerating soft diet. No nausea/vomiting. Pain controlled off PCA. Ambulating.  Objective: Vital signs in last 24 hours: Temp:  [97.4 F (36.3 C)-98.2 F (36.8 C)] 97.8 F (36.6 C) (01/12 0439) Pulse Rate:  [86-110] 86 (01/12 0439) Resp:  [14-18] 14 (01/12 0439) BP: (102-119)/(76-82) 119/76 (01/12 0439) SpO2:  [83 %-99 %] 99 % (01/12 0439) FiO2 (%):  [0 %] 0 % (01/11 0800) Last BM Date : 04/17/23  Intake/Output from previous day: 01/11 0701 - 01/12 0700 In: 2225 [P.O.:720; I.V.:1505] Out: 500 [Urine:500] Intake/Output this shift: No intake/output data recorded.  Exam: Awake and alert, NAD Neuro: alert and oriented Abdomen:  Midline incision clean and dry with staples in place. LUQ edematous, pink and productive of loose stool.  Lab Results:  Recent Labs    04/16/23 0855 04/17/23 0628  WBC 10.6* 7.7  HGB 13.8 11.9*  HCT 41.3 35.3*  PLT 266 260   BMET Recent Labs    04/15/23 0555 04/16/23 0855  NA 136 138  K 2.9* 3.2*  CL 103 101  CO2 27 28  GLUCOSE 104* 141*  BUN 13 10  CREATININE 0.88 0.71  CALCIUM  8.1* 8.8*   PT/INR No results for input(s): LABPROT, INR in the last 72 hours. ABG No results for input(s): PHART, HCO3 in the last 72 hours.  Invalid input(s): PCO2, PO2  Studies/Results: No results found.     Assessment/Plan: Large Bowel Obstruction POD4 s/p ex lap partial colectomy, takedown splenic flexure, end colostomy - pathology pending - Soft diet - Multimodal pain control - WOC RN consulted for new ostomy - Ambulate - Wean oxygen - VTE: lovenox , SCDs - Anticipate patient will be ready for discharge tomorrow    LOS: 8 days    Leonor LITTIE Dawn, MD [facility] 04/17/2023, 7:38 AM Please see Amion for pager number during day hours 7:00am-4:30pm

## 2023-04-17 NOTE — Progress Notes (Signed)
 PROGRESS NOTE    Leon Nichols  FMW:[id number] DOB: 1966-02-05 DOA: 04/09/2023 PCP: [facility]   Brief Narrative:  58 y.o. male with medical history significant of back pain, history of colitis, diverticulosis, DVT, hemorrhoids, herpes zoster, HIV, hypertension, multiple episodes of perianal/perirectal abscess,  diverting loop descending colostomy in 2013 with subsequent reversal of the colostomy in July 2024 presented with worsening abdominal distention and nausea.  On presentation, imaging revealed a severely dilated colon with a transition point in the left hemipelvis just above the area of the suture line and colostomy takedown and anastomosis.  General surgery and GI were consulted.  He underwent flexible sigmoidoscopy by GI on 04/11/2023.  He underwent exploratory laparotomy, partial colectomy and distal transverse colostomy on 04/13/2023 by general surgery.  Assessment & Plan:   Large bowel obstruction -underwent flexible sigmoidoscopy by GI on 04/11/2023: Pathology pending. -underwent exploratory laparotomy, partial colectomy and distal transverse colostomy on 04/13/2023 by general surgery. -Follow further general surgery recommendations.  Wound care/diet advancement/pain management as per general surgery.  Currently tolerating soft diet.  HIV disease -Triumeq  has been resumed.  Hypertension -Blood pressure intermittently on the higher side.  Continue amlodipine .  History of PE/DVT -History of IVC filter placement.    Hyperlipidemia -Lipitor on hold  Obesity -Outpatient follow-up  Hyponatremia -Improved  Hypokalemia -Replace.  Repeat a.m. labs.  DVT prophylaxis: Lovenox  code Status: Full Family Communication: None at bedside Disposition Plan: Status is: Inpatient Remains inpatient appropriate because: Of severity of illness    Consultants: GI/general surgery  Procedures: As above  Antimicrobials:  Anti-infectives (From admission, onward)    Start      Dose/Rate Route Frequency Ordered Stop   04/16/23 1200  abacavir -dolutegravir -lamiVUDine  (TRIUMEQ ) 600-50-300 MG per tablet 1 tablet        1 tablet Oral Daily 04/16/23 1043     04/13/23 2200  cefoTEtan  (CEFOTAN ) 2 g in sodium chloride  0.9 % 100 mL IVPB  Status:  Discontinued        2 g 200 mL/hr over 30 Minutes Intravenous Every 12 hours 04/13/23 1608 04/16/23 0813   04/13/23 0700  cefoTEtan  (CEFOTAN ) 2 g in sodium chloride  0.9 % 100 mL IVPB       Note to Pharmacy: Preop tomorrow for surgery tomorrow   2 g 200 mL/hr over 30 Minutes Intravenous To [facility] 04/12/23 0846 04/14/23 0734        Subjective: Patient seen and examined at pressure.  Currently tolerating soft diet.  No fever, vomiting, chest pain reported.  Having intermittent abdominal pain.   Objective: Vitals:   04/16/23 1429 04/16/23 2214 04/16/23 2214 04/17/23 0439  BP: 102/76  112/82 119/76  Pulse: (!) 110  92 86  Resp: 16  16 14   Temp: 98.2 F (36.8 C)  (!) 97.4 F (36.3 C) 97.8 F (36.6 C)  TempSrc: Oral  Oral Oral  SpO2: 91% (S) (!) 83% (S) 93% 99%  Weight:      Height:        Intake/Output Summary (Last 24 hours) at 04/17/2023 0800 Last data filed at 04/17/2023 0200 Gross per 24 hour  Intake 2225 ml  Output 500 ml  Net 1725 ml   Filed Weights   04/09/23 0837 04/09/23 1807 04/13/23 0939  Weight: 111.1 kg 109.9 kg 109.9 kg    Examination:  General: No distress.  Remains on room air. respiratory: Decreased breath sounds at bases bilaterally with some crackles  CVS: S1 and S2 are heard; rate mostly  controlled  abdominal: Soft, slightly tender with some distention; no organomegaly; bowel sounds are heard.  Abdominal dressing present with colostomy extremities: Trace lower extremity edema; no cyanosis      Data Reviewed: I have personally reviewed following labs and imaging studies  CBC: Recent Labs  Lab 04/12/23 0529 04/14/23 0543 04/15/23 0555 04/16/23 0855 04/17/23 0628  WBC  6.3 9.8 9.5 10.6* 7.7  NEUTROABS  --   --   --   --  4.6  HGB 14.0 14.0 12.8* 13.8 11.9*  HCT 40.5 42.6 38.3* 41.3 35.3*  MCV 90.6 93.6 93.0 92.6 92.2  PLT 213 225 225 266 260   Basic Metabolic Panel: Recent Labs  Lab 04/13/23 0534 04/13/23 1436 04/14/23 0543 04/15/23 0555 04/16/23 0855 04/17/23 0628  NA 134* 137 133* 136 138 138  K 2.6* 3.3* 3.8 2.9* 3.2* 2.9*  CL 102 104 101 103 101 99  CO2 22 23 23 27 28 31   GLUCOSE 97 157* 206* 104* 141* 118*  BUN 9 11 16 13 10 10   CREATININE 0.51* 1.12 1.34* 0.88 0.71 0.73  CALCIUM  8.6* 8.4* 7.9* 8.1* 8.8* 8.7*  MG 1.8  --  1.7 1.7 2.0 1.9   GFR: Estimated Creatinine Clearance: 135.6 mL/min (by C-G formula based on SCr of 0.73 mg/dL). Liver Function Tests: Recent Labs  Lab 04/12/23 0529  AST 18  ALT 12  ALKPHOS 64  BILITOT 0.8  PROT 7.2  ALBUMIN  3.6   No results for input(s): LIPASE, AMYLASE in the last 168 hours.  No results for input(s): AMMONIA in the last 168 hours. Coagulation Profile: No results for input(s): INR, PROTIME in the last 168 hours. Cardiac Enzymes: No results for input(s): CKTOTAL, CKMB, CKMBINDEX, TROPONINI in the last 168 hours. BNP (last 3 results) No results for input(s): PROBNP in the last 8760 hours. HbA1C: No results for input(s): HGBA1C in the last 72 hours. CBG: No results for input(s): GLUCAP in the last 168 hours.  Lipid Profile: No results for input(s): CHOL, HDL, LDLCALC, TRIG, CHOLHDL, LDLDIRECT in the last 72 hours. Thyroid  Function Tests: No results for input(s): TSH, T4TOTAL, FREET4, T3FREE, THYROIDAB in the last 72 hours. Anemia Panel: No results for input(s): VITAMINB12, FOLATE, FERRITIN, TIBC, IRON, RETICCTPCT in the last 72 hours. Sepsis Labs: No results for input(s): PROCALCITON, LATICACIDVEN in the last 168 hours.  No results found for this or any previous visit (from the past 240 hours).       Radiology  Studies: No results found.      Scheduled Meds:  abacavir -dolutegravir -lamiVUDine   1 tablet Oral Daily   acetaminophen   1,000 mg Oral Q6H   amLODipine   10 mg Oral Daily   enoxaparin  (LOVENOX ) injection  55 mg Subcutaneous Q24H   feeding supplement  237 mL Oral BID BM   lisinopril   20 mg Oral Daily   pantoprazole   40 mg Oral Daily   Continuous Infusions:          Sophie Mao, MD [facility] 04/17/2023, 8:00 AM

## 2023-04-17 NOTE — Plan of Care (Signed)
  Problem: Education: Goal: Knowledge of General Education information will improve Description: Including pain rating scale, medication(s)/side effects and non-pharmacologic comfort measures Outcome: Progressing   Problem: Clinical Measurements: Goal: Diagnostic test results will improve Outcome: Progressing Goal: Cardiovascular complication will be avoided Outcome: Progressing   Problem: Clinical Measurements: Goal: Cardiovascular complication will be avoided Outcome: Progressing   Problem: Nutrition: Goal: Adequate nutrition will be maintained Outcome: Progressing

## 2023-04-18 DIAGNOSIS — K56609 Unspecified intestinal obstruction, unspecified as to partial versus complete obstruction: Secondary | ICD-10-CM | POA: Diagnosis not present

## 2023-04-18 LAB — SURGICAL PATHOLOGY

## 2023-04-18 LAB — BASIC METABOLIC PANEL
Anion gap: 8 (ref 5–15)
BUN: 9 mg/dL (ref 6–20)
CO2: 30 mmol/L (ref 22–32)
Calcium: 8.8 mg/dL — ABNORMAL LOW (ref 8.9–10.3)
Chloride: 100 mmol/L (ref 98–111)
Creatinine, Ser: 0.73 mg/dL (ref 0.61–1.24)
GFR, Estimated: >60 mL/min (ref >60)
Glucose, Bld: 132 mg/dL — ABNORMAL HIGH (ref 70–99)
Potassium: 2.9 mmol/L — ABNORMAL LOW (ref 3.5–5.1)
Sodium: 138 mmol/L (ref 135–145)

## 2023-04-18 LAB — MAGNESIUM: Magnesium: 1.7 mg/dL (ref 1.7–2.4)

## 2023-04-18 MED ORDER — CARBAMIDE PEROXIDE 6.5 % OT SOLN: 5.0000 [drp] | Freq: Two times a day (BID) | OTIC | Status: DC

## 2023-04-18 MED ORDER — POTASSIUM CHLORIDE CRYS ER 20 MEQ PO TBCR
40.0000 meq | EXTENDED_RELEASE_TABLET | ORAL | Status: DC
  Administered 2023-04-18: 40 meq via ORAL
  Filled 2023-04-18: qty 2

## 2023-04-18 MED ORDER — ACETAMINOPHEN 500 MG PO TABS: 1000.0000 mg | ORAL_TABLET | Freq: Four times a day (QID) | ORAL | Status: AC

## 2023-04-18 MED ORDER — METHOCARBAMOL 500 MG PO TABS: 500.0000 mg | ORAL_TABLET | Freq: Four times a day (QID) | ORAL | 0 refills | Status: AC | PRN

## 2023-04-18 MED ORDER — OXYCODONE HCL 5 MG PO TABS: 5.0000 mg | ORAL_TABLET | ORAL | 0 refills | Status: AC | PRN

## 2023-04-18 MED ORDER — ONDANSETRON HCL 4 MG PO TABS: 4.0000 mg | ORAL_TABLET | Freq: Four times a day (QID) | ORAL | 0 refills | Status: AC | PRN

## 2023-04-18 MED ORDER — POTASSIUM CHLORIDE CRYS ER 20 MEQ PO TBCR: 20.0000 meq | EXTENDED_RELEASE_TABLET | Freq: Two times a day (BID) | ORAL | 0 refills | Status: AC

## 2023-04-18 MED ORDER — POTASSIUM CHLORIDE 20 MEQ PO PACK
40.0000 meq | PACK | Freq: Two times a day (BID) | ORAL | Status: DC
Start: 2023-04-18 — End: 2023-04-18
  Administered 2023-04-18: 40 meq via ORAL
  Filled 2023-04-18: qty 2

## 2023-04-18 MED ORDER — CARBAMIDE PEROXIDE 6.5 % OT SOLN
5.0000 [drp] | Freq: Two times a day (BID) | OTIC | Status: DC
  Filled 2023-04-18: qty 15

## 2023-04-18 MED ORDER — MAGNESIUM SULFATE 4 GM/100ML IV SOLN
4.0000 g | Freq: Once | INTRAVENOUS | Status: AC
  Administered 2023-04-18: 4 g via INTRAVENOUS
  Filled 2023-04-18: qty 100

## 2023-04-18 NOTE — TOC Transition Note (Signed)
 Transition of Care [facility]) - Discharge Note   Patient Details  Name: Leon Nichols MRN: [id number] Date of Birth: 09-24-1965  [facility] of [facility] CM/SW Contact:  Toy LITTIE Agar, RN Phone Number:[phone]  04/18/2023, 11:07 AM   Clinical Narrative:    Patient with discharge orders. CM at bedside for disposition needs. Patient refuses DME recommendation stating that he is able to get around  independently and does not need any DME. Patient also declines home health services. There are currently no other needs noted. TOC will sign off.     Final next level of care: Home/Self Care Barriers to Discharge: No Barriers Identified   Patient Goals and CMS Choice Patient states their goals for this hospitalization and ongoing recovery are:: Ready to go home CMS Medicare.gov Compare Post Acute Care list provided to::  (n/a) Choice offered to / list presented to : NA [place] ownership interest in [facility].provided to::  (n/a)    Discharge Placement                       Discharge Plan and Services Additional resources added to the After Visit Summary for                  DME Arranged: N/A (Refused) DME Agency: NA       HH Arranged: Patient Refused HH HH Agency: NA        Social Drivers of Health (SDOH) Interventions SDOH Screenings   Food Insecurity: No Food Insecurity (04/09/2023)  Housing: Low Risk  (04/09/2023)  Transportation Needs: No Transportation Needs (04/09/2023)  Utilities: Not At Risk (04/09/2023)  Depression (PHQ2-9): Low Risk  (03/18/2022)  Tobacco Use: Medium Risk (04/13/2023)     Readmission Risk Interventions     No data to display

## 2023-04-18 NOTE — Discharge Summary (Signed)
 Physician Discharge Summary  Leon Nichols FMW:[id number] DOB: 25-Oct-1965 DOA: 04/09/2023  PCP: [facility]  Admit date: 04/09/2023 Discharge date: 04/18/2023  Admitted From: Home Disposition: Home  Recommendations for Outpatient Follow-up:  Follow up with PCP in 1 week with repeat CBC/BMP Outpatient follow-up with general surgery.  Wound/ostomy care/pain management as per general surgery recommendations Outpatient follow-up with ID Follow up in ED if symptoms worsen or new appear   Home Health: No Equipment/Devices: None  Discharge Condition: Stable CODE STATUS: Full Diet recommendation: Heart healthy/soft diet  Brief/Interim Summary: 58 y.o. male with medical history significant of back pain, history of colitis, diverticulosis, DVT, hemorrhoids, herpes zoster, HIV, hypertension, multiple episodes of perianal/perirectal abscess,  diverting loop descending colostomy in 2013 with subsequent reversal of the colostomy in July 2024 presented with worsening abdominal distention and nausea.  On presentation, imaging revealed a severely dilated colon with a transition point in the left hemipelvis just above the area of the suture line and colostomy takedown and anastomosis.  General surgery and GI were consulted.  He underwent flexible sigmoidoscopy by GI on 04/11/2023.  He underwent exploratory laparotomy, partial colectomy and distal transverse colostomy on 04/13/2023 by general surgery.  Subsequently, his condition has improved.  He is tolerating advanced diet.  General surgery has cleared him for discharge home today.  Discharge patient home today with outpatient follow-up with PCP and general surgery.  Discharge Diagnoses:   Large bowel obstruction -underwent flexible sigmoidoscopy by GI on 04/11/2023: Pathology pending. -underwent exploratory laparotomy, partial colectomy and distal transverse colostomy on 04/13/2023 by general surgery. -Subsequently, his condition has improved.  He is  tolerating advanced diet.  General surgery has cleared him for discharge home today.  Discharge patient home today with outpatient follow-up with PCP and general surgery. Wound care/ostomy care/pain management as per general surgery.    HIV disease -Triumeq  has been resumed.  Outpatient follow-up with ID   Hypertension -Blood pressure intermittently on the higher side.  Continue amlodipine  and lisinopril .  Outpatient follow-up with PCP   History of PE/DVT -History of IVC filter placement.     Hyperlipidemia -Lipitor to be resumed on dischargeObesity -Outpatient follow-up   Hyponatremia -Improved   Hypokalemia -Replace.  Continue replacement on discharge.  Outpatient follow-up.    Obesity -Outpatient follow-up  Discharge Instructions  Discharge Instructions     Diet - low sodium heart healthy   Complete by: As directed    Discharge wound care:   Complete by: As directed    As per general surgery recommendations   Increase activity slowly   Complete by: As directed       Allergies as of 04/18/2023   No Known Allergies      Medication List     STOP taking these medications    ibuprofen  800 MG tablet Commonly known as: ADVIL    ondansetron  4 MG disintegrating tablet Commonly known as: ZOFRAN -ODT   polyethylene glycol powder 17 GM/SCOOP powder Commonly known as: GLYCOLAX /MIRALAX    tamsulosin  0.4 MG Caps capsule Commonly known as: FLOMAX        TAKE these medications    acetaminophen  500 MG tablet Commonly known as: TYLENOL  Take 2 tablets (1,000 mg total) by mouth every 6 (six) hours.   amLODipine  10 MG tablet Commonly known as: NORVASC  TAKE 1 TABLET(10 MG) BY MOUTH DAILY   gabapentin  100 MG capsule Commonly known as: NEURONTIN  Take 100 mg by mouth at bedtime.   lisinopril  20 MG tablet Commonly known as: ZESTRIL  TAKE 1  TABLET(20 MG) BY MOUTH DAILY   methocarbamol  500 MG tablet Commonly known as: ROBAXIN  Take 1 tablet (500 mg total) by mouth  every 6 (six) hours as needed for muscle spasms.   ondansetron  4 MG tablet Commonly known as: ZOFRAN  Take 1 tablet (4 mg total) by mouth every 6 (six) hours as needed for refractory nausea / vomiting.   oxyCODONE  5 MG immediate release tablet Commonly known as: Oxy IR/ROXICODONE  Take 1 tablet (5 mg total) by mouth every 4 (four) hours as needed for moderate pain (pain score 4-6) or severe pain (pain score 7-10) (5 mg for 4-6/10 pain. 10 mg for >6/10 pain). What changed:  when to take this reasons to take this   pantoprazole  40 MG tablet Commonly known as: PROTONIX  Take 1 tablet (40 mg total) by mouth daily.   potassium chloride  SA 20 MEQ tablet Commonly known as: KLOR-CON  M Take 1 tablet (20 mEq total) by mouth 2 (two) times daily.   rosuvastatin  10 MG tablet Commonly known as: CRESTOR  TAKE 1 TABLET(10 MG) BY MOUTH DAILY   Triumeq  600-50-300 MG tablet Generic drug: abacavir -dolutegravir -lamiVUDine  TAKE 1 TABLET BY MOUTH DAILY               Discharge Care Instructions  (From admission, onward)           Start     Ordered   04/18/23 0000  Discharge wound care:       Comments: As per general surgery recommendations   04/18/23 1036            Follow-up Information     [facility]. Schedule an appointment as soon as possible for a visit in 1 week(s).   Specialty: Internal Medicine Contact information: [address] [place] [address] [phone]                No Known Allergies  Consultations: GI/general surgery   Procedures/Studies: DG CHEST PORT 1 VIEW Result Date: 04/11/2023 CLINICAL DATA:  Shortness of breath EXAM: PORTABLE CHEST 1 VIEW COMPARISON:  02/25/2021 FINDINGS: Cardiac shadow is enlarged but stable. Lungs are well aerated bilaterally. Elevation of the left hemidiaphragm is noted with mild left basilar atelectasis. No bony abnormality is noted. IMPRESSION: Left basilar atelectasis. Electronically Signed   By: Oneil Devonshire M.D.   On: 04/11/2023 17:37   DG Abdomen 1 View Result Date: 04/09/2023 CLINICAL DATA:  Post NG tube insertion EXAM: ABDOMEN - 1 VIEW COMPARISON:  CT abdomen pelvis 04/09/2023 FINDINGS: Enteric tube courses below the hemidiaphragm with tip and side port overlying the expected region the gastric lumen. Gaseous dilatation of several loops of large bowel. IVC filter noted. No radio-opaque calculi or other significant radiographic abnormality are seen. IMPRESSION: 1. Enteric tube in good position. 2. Findings suggestive of large bowel obstruction. Electronically Signed   By: Morgane  Naveau M.D.   On: 04/09/2023 14:10   CT ABDOMEN PELVIS W CONTRAST Result Date: 04/09/2023 CLINICAL DATA:  Acute abdominal pain.  Previous colostomy takedown. EXAM: CT ABDOMEN AND PELVIS WITH CONTRAST TECHNIQUE: Multidetector CT imaging of the abdomen and pelvis was performed using the standard protocol following bolus administration of intravenous contrast. RADIATION DOSE REDUCTION: This exam was performed according to the departmental dose-optimization program which includes automated exposure control, adjustment of the mA and/or kV according to patient size and/or use of iterative reconstruction technique. CONTRAST:  OMNIPAQUE  IOHEXOL  300 MG/ML  SOLN COMPARISON:  CT 11/26/2021. FINDINGS: Lower chest: Bandlike opacities along the lung bases.  Atelectasis is favored. Some breathing motion. Mild ground-glass. Hepatobiliary: Preserved hepatic parenchyma. Patent portal vein. Gallbladder is nondilated. Pancreas: Unremarkable. No pancreatic ductal dilatation or surrounding inflammatory changes. Spleen: Normal in size without focal abnormality. Adrenals/Urinary Tract: Adrenal glands are unremarkable. Kidneys are normal, without renal calculi, focal lesion, or hydronephrosis. Bladder is unremarkable. Stomach/Bowel: Surgical changes identified along the distal descending, sigmoid colon with a primary anastomosis. There is thickening  along the adjacent anterior abdominal wall in the area of previous ostomy. There is a nodular soft tissue thickening in this location along the bowel with the proximal dilatation of the colon measuring up to 14.6 cm. Air-fluid levels along the proximal colon. A developing high-grade obstruction is a concern. Please correlate clinical findings. The small bowel and stomach are nondilated. Slight thickening in the area of the rectum and anal region, nonspecific. Vascular/Lymphatic: Normal caliber aorta and IVC. There is a IVC filter in place. Are some small mesenteric nodes identified including immediately adjacent to the area of the transition point in the left hemipelvis with a node measuring 14 by 8 mm on series 2, image 70. Reproductive: Prostate is unremarkable. Other: No free air, free fluid. Musculoskeletal: Mild degenerative changes of the spine and pelvis. Critical Value/emergent results were called by telephone at the time of interpretation on 04/09/2023 at 10:00 amPST to provider [facility] , who verbally acknowledged these results. IMPRESSION: Severely dilated colon with a transition point in the left hemipelvis just above the area of the suture line for the colostomy takedown and anastomosis. The tissue in this location is nodular and thickened. Please correlate for etiology of the developing potential high-grade obstruction. Neoplasm in principle is not excluded. No pneumatosis or free air. Few prominent mesenteric lymph nodes. IVC filter. Electronically Signed   By: Ranell Bring M.D.   On: 04/09/2023 13:02      Subjective: Patient seen and examined at bedside.  Tolerating regular diet.  Feels okay to go home today.  No fever or vomiting reported.  Discharge Exam: Vitals:   04/18/23 0425 04/18/23 1001  BP: 116/87 (!) 125/90  Pulse: 88 88  Resp: 14 15  Temp: 98.3 F (36.8 C) 98.4 F (36.9 C)  SpO2: 97% 98%    General: Pt is alert, awake, not in acute distress Cardiovascular: rate  controlled, S1/S2 + Respiratory: bilateral decreased breath sounds at bases Abdominal: Soft, obese, mild tender in distended, colostomy present, bowel sounds + Extremities: no edema, no cyanosis    The results of significant diagnostics from this hospitalization (including imaging, microbiology, ancillary and laboratory) are listed below for reference.     Microbiology: No results found for this or any previous visit (from the past 240 hours).   Labs: BNP (last 3 results) No results for input(s): BNP in the last 8760 hours. Basic Metabolic Panel: Recent Labs  Lab 04/14/23 0543 04/15/23 0555 04/16/23 0855 04/17/23 0628 04/18/23 0537  NA 133* 136 138 138 138  K 3.8 2.9* 3.2* 2.9* 2.9*  CL 101 103 101 99 100  CO2 23 27 28 31 30   GLUCOSE 206* 104* 141* 118* 132*  BUN 16 13 10 10 9   CREATININE 1.34* 0.88 0.71 0.73 0.73  CALCIUM  7.9* 8.1* 8.8* 8.7* 8.8*  MG 1.7 1.7 2.0 1.9 1.7   Liver Function Tests: Recent Labs  Lab 04/12/23 0529  AST 18  ALT 12  ALKPHOS 64  BILITOT 0.8  PROT 7.2  ALBUMIN  3.6   No results for input(s): LIPASE, AMYLASE in the last  168 hours. No results for input(s): AMMONIA in the last 168 hours. CBC: Recent Labs  Lab 04/12/23 0529 04/14/23 0543 04/15/23 0555 04/16/23 0855 04/17/23 0628  WBC 6.3 9.8 9.5 10.6* 7.7  NEUTROABS  --   --   --   --  4.6  HGB 14.0 14.0 12.8* 13.8 11.9*  HCT 40.5 42.6 38.3* 41.3 35.3*  MCV 90.6 93.6 93.0 92.6 92.2  PLT 213 225 225 266 260   Cardiac Enzymes: No results for input(s): CKTOTAL, CKMB, CKMBINDEX, TROPONINI in the last 168 hours. BNP: Invalid input(s): POCBNP CBG: No results for input(s): GLUCAP in the last 168 hours. D-Dimer No results for input(s): DDIMER in the last 72 hours. Hgb A1c No results for input(s): HGBA1C in the last 72 hours. Lipid Profile No results for input(s): CHOL, HDL, LDLCALC, TRIG, CHOLHDL, LDLDIRECT in the last 72 hours. Thyroid  function  studies No results for input(s): TSH, T4TOTAL, T3FREE, THYROIDAB in the last 72 hours.  Invalid input(s): FREET3 Anemia work up No results for input(s): VITAMINB12, FOLATE, FERRITIN, TIBC, IRON, RETICCTPCT in the last 72 hours. Urinalysis    Component Value Date/Time   COLORURINE YELLOW 04/09/2023 0846   APPEARANCEUR CLEAR 04/09/2023 0846   LABSPEC 1.019 04/09/2023 0846   PHURINE 5.0 04/09/2023 0846   GLUCOSEU NEGATIVE 04/09/2023 0846   HGBUR NEGATIVE 04/09/2023 0846   HGBUR negative 03/29/2007 0859   BILIRUBINUR NEGATIVE 04/09/2023 0846   KETONESUR NEGATIVE 04/09/2023 0846   PROTEINUR 100 (A) 04/09/2023 0846   UROBILINOGEN 0.2 02/02/2014 0736   NITRITE NEGATIVE 04/09/2023 0846   LEUKOCYTESUR NEGATIVE 04/09/2023 0846   Sepsis Labs Recent Labs  Lab 04/14/23 0543 04/15/23 0555 04/16/23 0855 04/17/23 0628  WBC 9.8 9.5 10.6* 7.7   Microbiology No results found for this or any previous visit (from the past 240 hours).   Time coordinating discharge: 35 minutes  SIGNED:   Sophie Mao, MD  [facility] 04/18/2023, 10:37 AM

## 2023-04-18 NOTE — Progress Notes (Signed)
 5 Days Post-Op   Subjective/Chief Complaint: Colostomy functional, tol diet, pain controlled   Objective: Vital signs in last 24 hours: Temp:  [97.7 F (36.5 C)-98.4 F (36.9 C)] 98.4 F (36.9 C) (01/13 1001) Pulse Rate:  [88-97] 88 (01/13 1001) Resp:  [14-16] 15 (01/13 1001) BP: (116-129)/(87-102) 125/90 (01/13 1001) SpO2:  [90 %-98 %] 98 % (01/13 1001) Last BM Date : 04/17/23  Intake/Output from previous day: 01/12 0701 - 01/13 0700 In: 240 [P.O.:240] Out: 450 [Urine:450] Intake/Output this shift: Total I/O In: -  Out: 40 [Stool:40]  Ab soft approp tender staples intact, no wound infection, stoma pink and functional  Lab Results:  Recent Labs    04/16/23 0855 04/17/23 0628  WBC 10.6* 7.7  HGB 13.8 11.9*  HCT 41.3 35.3*  PLT 266 260   BMET Recent Labs    04/17/23 0628 04/18/23 0537  NA 138 138  K 2.9* 2.9*  CL 99 100  CO2 31 30  GLUCOSE 118* 132*  BUN 10 9  CREATININE 0.73 0.73  CALCIUM  8.7* 8.8*   PT/INR No results for input(s): LABPROT, INR in the last 72 hours. ABG No results for input(s): PHART, HCO3 in the last 72 hours.  Invalid input(s): PCO2, PO2  Studies/Results: No results found.  Anti-infectives: Anti-infectives (From admission, onward)    Start     Dose/Rate Route Frequency Ordered Stop   04/16/23 1200  abacavir -dolutegravir -lamiVUDine  (TRIUMEQ ) 600-50-300 MG per tablet 1 tablet        1 tablet Oral Daily 04/16/23 1043     04/13/23 2200  cefoTEtan  (CEFOTAN ) 2 g in sodium chloride  0.9 % 100 mL IVPB  Status:  Discontinued        2 g 200 mL/hr over 30 Minutes Intravenous Every 12 hours 04/13/23 1608 04/16/23 0813   04/13/23 0700  cefoTEtan  (CEFOTAN ) 2 g in sodium chloride  0.9 % 100 mL IVPB       Note to Pharmacy: Preop tomorrow for surgery tomorrow   2 g 200 mL/hr over 30 Minutes Intravenous To [facility] 04/12/23 0846 04/14/23 0734       Assessment/Plan: Large Bowel Obstruction POD 5 s/p ex lap partial  colectomy, end colostomy - pathology benign - Soft diet - Multimodal pain control - WOC RN has seen - Ambulate - Wean oxygen - VTE: lovenox , SCDs - he can go home from my standpoint today, I have written postop pain meds, will setup follow up for staple removal and postop  Donnice Bury 04/18/2023

## 2023-04-18 NOTE — Consult Note (Addendum)
 WOC Nurse ostomy consult note Pt is familiar with pouch application and has been emptying independently.  Stoma remains edematous and weeping behind the barrier.  Stoma type/location:  transverse colostomy LMQ  Stomal assessment/size: slightly decreased in size since Fri: 2 3/4 inches, red and viable, above skin level, mod amt clear leaking fluid from stoma. Emptied 40cc light brown fluid from the pouch.  Ostomy pouching: applied barrier ring to edges and placed in a 4 ostomy kit, ostomy is too large at this time for any other pouch to fit, discussed with patient that this will be a short-term occurrence and he will be able to cut the opening smaller and resume use of a one piece pouch that he was using prior to admission within approx 2 weeks.  Education provided: Patient is very knowledgeable and independent in ostomy care however his previous colostomy was not this large and he was able to wear a flexible 1 piece which is what he prefers. I ordered 5 sets of supplies to the room fror patient use after discharge: use barrier rings, Gerlean # [phone] and large piece pouch, Gerlean [phone].   He denies further questions regarding ostomy care and states he hopes to discharge soon.   Thank-you,  Stephane Fought MSN, RN, CWOCN, [place], CNS [phone]

## 2023-04-18 NOTE — Plan of Care (Signed)

## 2023-05-02 ENCOUNTER — Ambulatory Visit (HOSPITAL_COMMUNITY)
Admission: RE | Admit: 2023-05-02 | Discharge: 2023-05-02 | Disposition: A | Discharge status: Home / Self Care | Payer: 59 | Source: Ambulatory Visit | Attending: Nurse Practitioner | Admitting: Nurse Practitioner

## 2023-05-02 DIAGNOSIS — Z433 Encounter for attention to colostomy: Secondary | ICD-10-CM | POA: Diagnosis present

## 2023-05-02 NOTE — Progress Notes (Cosign Needed)
[facility]   Reason for visit:  LMQ transverse colostomy  Edema in hospital required using 4 inch pouch.  This has subsided now.   Will establish with [facility] for supplies again.  HPI:  Exploratory laparotomy with resection and end transverse colostomy Past Medical History:  Diagnosis Date   Acute back pain 10/28/2016   Colitis    Diverticulosis    DVT (deep venous thrombosis) (HCC)    Hemorrhoids    History of shingles    HIV (human immunodeficiency virus infection) (HCC) 03/24/2012   Hypertension    Perianal abscess 02/06/2015   Perirectal abscess    Septic arthritis (HCC) 02/05/2015   Family History  Problem Relation Age of Onset   Hypertension Mother    Diabetes Mother    No Known Allergies Current Outpatient Medications  Medication Sig Dispense Refill Last Dose/Taking   acetaminophen (TYLENOL) 500 MG tablet Take 2 tablets (1,000 mg total) by mouth every 6 (six) hours.      amLODipine (NORVASC) 10 MG tablet TAKE 1 TABLET(10 MG) BY MOUTH DAILY 30 tablet 11    gabapentin (NEURONTIN) 100 MG capsule Take 100 mg by mouth at bedtime.      lisinopril (ZESTRIL) 20 MG tablet TAKE 1 TABLET(20 MG) BY MOUTH DAILY 30 tablet 5    methocarbamol (ROBAXIN) 500 MG tablet Take 1 tablet (500 mg total) by mouth every 6 (six) hours as needed for muscle spasms. 30 tablet 0    ondansetron (ZOFRAN) 4 MG tablet Take 1 tablet (4 mg total) by mouth every 6 (six) hours as needed for refractory nausea / vomiting. 20 tablet 0    oxyCODONE (OXY IR/ROXICODONE) 5 MG immediate release tablet Take 1 tablet (5 mg total) by mouth every 4 (four) hours as needed for moderate pain (pain score 4-6) or severe pain (pain score 7-10) (5 mg for 4-6/10 pain. 10 mg for >6/10 pain). 15 tablet 0    pantoprazole (PROTONIX) 40 MG tablet Take 1 tablet (40 mg total) by mouth daily. (Patient not taking: Reported on 01/26/2023) 30 tablet 11    potassium chloride SA (KLOR-CON M) 20 MEQ tablet Take 1 tablet (20 mEq  total) by mouth 2 (two) times daily. 30 tablet 0    rosuvastatin (CRESTOR) 10 MG tablet TAKE 1 TABLET(10 MG) BY MOUTH DAILY 30 tablet 5    TRIUMEQ 600-50-300 MG tablet TAKE 1 TABLET BY MOUTH DAILY 30 tablet 5    No current facility-administered medications for this encounter.   ROS  Review of Systems  Gastrointestinal:        LMQ transverse colostomy Hx bowel obstruction  Musculoskeletal: Negative.   Skin: Negative.   Psychiatric/Behavioral: Negative.    All other systems reviewed and are negative.  Vital signs:  There were no vitals taken for this visit. Exam:  Physical Exam Constitutional:      Appearance: Normal appearance.  HENT:     Mouth/Throat:     Mouth: Mucous membranes are moist.  Abdominal:     Palpations: Abdomen is soft.  Musculoskeletal:        General: Normal range of motion.  Skin:    General: Skin is warm and dry.  Neurological:     Mental Status: He is alert and oriented to person, place, and time. Mental status is at baseline.  Psychiatric:        Mood and Affect: Mood normal.        Behavior: Behavior normal.     Stoma type/location:  LMQ colostomy Stomal assessment/size:  2 1/2" budded pink and moist  Peristomal assessment:  intact Treatment options for stomal/peristomal skin: wants to transition back to 1 piece flat he used before.  His stoma has shrunk enough now to accommodate the pouch.  WIll update orders with [facility] to resume.   Output: soft brown stool Ostomy pouching: 1pc.flat with barrier ring  Education provided:  He states he feels comfortable with stoma care No identified ostomy teaching needs at this time.      Impression/dx  Colostomy care Discussion  See above  will re enroll with [facility] Plan  See back as needed    Visit time: 45 minutes.   Mike Gip FNP-BC

## 2023-05-09 ENCOUNTER — Other Ambulatory Visit (HOSPITAL_COMMUNITY): Payer: Self-pay | Admitting: Nurse Practitioner

## 2023-05-09 DIAGNOSIS — Z433 Encounter for attention to colostomy: Secondary | ICD-10-CM

## 2023-05-09 NOTE — Discharge Instructions (Signed)
Will update prescription with [facility] for 1piece flat pouch

## 2023-05-31 ENCOUNTER — Other Ambulatory Visit (HOSPITAL_COMMUNITY): Payer: Self-pay | Admitting: Nurse Practitioner

## 2023-05-31 DIAGNOSIS — Z433 Encounter for attention to colostomy: Secondary | ICD-10-CM

## 2023-06-01 ENCOUNTER — Ambulatory Visit: Payer: 59 | Admitting: Internal Medicine

## 2023-06-08 ENCOUNTER — Ambulatory Visit: Payer: 59 | Admitting: Internal Medicine

## 2023-06-13 ENCOUNTER — Ambulatory Visit: Admitting: Internal Medicine

## 2023-06-23 ENCOUNTER — Ambulatory Visit: Admitting: Internal Medicine

## 2023-06-24 ENCOUNTER — Encounter (HOSPITAL_BASED_OUTPATIENT_CLINIC_OR_DEPARTMENT_OTHER): Payer: Self-pay | Admitting: Otolaryngology

## 2023-06-24 ENCOUNTER — Other Ambulatory Visit: Payer: Self-pay

## 2023-07-01 ENCOUNTER — Encounter (HOSPITAL_BASED_OUTPATIENT_CLINIC_OR_DEPARTMENT_OTHER)
Admission: RE | Admit: 2023-07-01 | Discharge: 2023-07-01 | Disposition: A | Discharge status: Home / Self Care | Source: Ambulatory Visit | Attending: Otolaryngology | Admitting: Otolaryngology

## 2023-07-01 DIAGNOSIS — I10 Essential (primary) hypertension: Secondary | ICD-10-CM | POA: Insufficient documentation

## 2023-07-01 DIAGNOSIS — R9431 Abnormal electrocardiogram [ECG] [EKG]: Secondary | ICD-10-CM | POA: Diagnosis not present

## 2023-07-01 DIAGNOSIS — Z01818 Encounter for other preprocedural examination: Secondary | ICD-10-CM | POA: Diagnosis present

## 2023-07-01 LAB — BASIC METABOLIC PANEL WITH GFR
Anion gap: 7 (ref 5–15)
BUN: 10 mg/dL (ref 6–20)
CO2: 29 mmol/L (ref 22–32)
Calcium: 9.2 mg/dL (ref 8.9–10.3)
Chloride: 103 mmol/L (ref 98–111)
Creatinine, Ser: 0.81 mg/dL (ref 0.61–1.24)
GFR, Estimated: >60 mL/min (ref >60)
Glucose, Bld: 133 mg/dL — ABNORMAL HIGH (ref 70–99)
Potassium: 3.8 mmol/L (ref 3.5–5.1)
Sodium: 139 mmol/L (ref 135–145)

## 2023-07-01 NOTE — H&P (Signed)
 HPI:   Leon Nichols is a 58 y.o. male who presents as a new patient.  Leon Nichols presents today with a complaint of decreased hearing in his left ear. He states that he has a perforation in this tympanic membrane. He has been aware of this for 12 to 13 years. He thinks it may possibly be related to when he had shingles. He is not experiencing any pain, drainage, bleeding.  PMH/Meds/All/SocHx/FamHx/ROS:   Past Medical History:  Diagnosis Date  Hypertension  Pulmonary emboli (CMD)   History reviewed. No pertinent surgical history.  No family history of bleeding disorders, wound healing problems or difficulty with anesthesia.     Current Outpatient Medications:  abacavir-dolutegravir-lamiVUDine (Triumeq) 600-50-300 mg per tablet, Take 1 tablet by mouth Once Daily., Disp: , Rfl:  amLODIPine (NORVASC) 10 mg tablet, Take 10 mg by mouth Once Daily., Disp: , Rfl:  apixaban (Eliquis) 5 mg tab, Take 5 mg by mouth., Disp: , Rfl:  gabapentin (NEURONTIN) 100 mg capsule, Take 100 mg by mouth nightly., Disp: , Rfl:  lisinopriL (PRINIVIL) 20 mg tablet, Take 20 mg by mouth daily., Disp: , Rfl:  ondansetron (ZOFRAN-ODT) 4 mg disintegrating tablet, Dissolve 4 mg on tongue., Disp: , Rfl:  pantoprazole (PROTONIX) 40 mg EC tablet, Take 40 mg by mouth daily., Disp: , Rfl:  rosuvastatin (CRESTOR) 10 mg tablet, Take 10 mg by mouth daily., Disp: , Rfl:   A complete ROS was performed with pertinent positives/negatives noted in the HPI. The remainder of the ROS are negative.   Physical Exam:   Temp 97.3 F (36.3 C)  Ht 1.854 m (6\' 1" )  Wt 117 kg (257 lb 6.4 oz)  BMI 33.96 kg/m   Constitutional:  Patient appears well-nourished and well-developed. No acute distress.  Head/Face: Facial features are symmetric. Skull is normocephalic. Hair and scalp are normal. Normal temporal artery pulses. TMJ shows no joint deformity swelling or erythema.  Eyes: Pupils are equal, round and reactive to light.  Conjunctiva and lids are normal. Normal extraocular mobility. Normal vision by patient report.  Ears:  Right: Pinna and external meatus normal, normal ear canal skin and caliber without excessive cerumen or drainage. Tympanic membranes intact without effusion or infection.  Left: Pinna and external meatus normal, normal ear canal skin and caliber without excessive cerumen or drainage. Tympanic membrane shows perforation without effusion or infection.  Nose/Sinus/Nasopharynx: Septum is normal. Normal nasal mucosa. Normal inferior turbinates.   Oral cavity/Oropharynx: Lips normal, teeth and gums normal with good dentition, normal oral vestibule. Normal floor of mouth, tongue and oral mucosa, no mucosal lesions, ulcer or mass, normal tongue mobility.  Hard and soft palate normal with normal mobility. One plus tonsils, no erythema or exudate. Base of tongue, retromolar trigone and oral pharynx normal. Normal sensation, mobility and gag.  Neck: No cervical lymphadenopathy, mass or swelling. Salivary glands normal to palpation without swelling, erythema or mass. Normal facial nerve function. Normal thyroid gland palpation.  Neurological: Alert and oriented to self, place and time. Normal reflexes and motor skills, balance and coordination.  Psychiatric: No unusual anxiety or evidence of depression. Appropriate affect.Constitutional:  Patient appears well-nourished and well-developed. No acute distress.  Head/Face: Facial features are symmetric. Skull is normocephalic. Hair and scalp are normal. Normal temporal artery pulses. TMJ shows no joint deformity swelling or erythema.  Eyes: Pupils are equal, round and reactive to light. Conjunctiva and lids are normal. Normal extraocular mobility. Normal vision by patient report.  Ears:  Right: Pinna and external  meatus normal, normal ear canal skin and caliber without excessive cerumen or drainage. Tympanic membranes intact without effusion  or infection. Hearing normal. Left: Pinna and external meatus normal, normal ear canal skin and caliber without excessive cerumen or drainage. Tympanic membranes intact without effusion or infection. Hearing normal.  Nose/Sinus/Nasopharynx: Septum is normal. Normal nasal mucosa. Normal inferior turbinates.   Oral cavity/Oropharynx: Lips normal, teeth and gums normal with good dentition, normal oral vestibule. Normal floor of mouth, tongue and oral mucosa, no mucosal lesions, ulcer or mass, normal tongue mobility.  Hard and soft palate normal with normal mobility. One plus tonsils, no erythema or exudate. Base of tongue, retromolar trigone and oral pharynx normal. Normal sensation, mobility and gag.  Neck: No cervical lymphadenopathy, mass or swelling. Salivary glands normal to palpation without swelling, erythema or mass. Normal facial nerve function. Normal thyroid gland palpation.  Neurological: Alert and oriented to self, place and time. Normal reflexes and motor skills, balance and coordination.  Psychiatric: No unusual anxiety or evidence of depression. Appropriate affect.  Independent Review of Additional Tests or Records:  Audiological evaluation: Audiometry: The right ear shows normal hearing from 250 Hz through 8000 Hz. The left ear shows mild to moderately severe conductive hearing loss. SRT's: Right ear shows 10 dB HL. The left ear shows 50 dB HL with 35 dB EM. Word recognition scores: The right ear shows 92% at 55 dB HL. The left ear shows 96% at 90 dB HL with 70 dB EM. Tympanometry: The right ear shows a type a tympanogram. The left ear shows a type B tympanogram with normal volume  Procedures:  None  Impression & Plans:   1) left tympanic membrane perforation 2) conductive hearing loss left ear

## 2023-07-04 ENCOUNTER — Encounter (HOSPITAL_BASED_OUTPATIENT_CLINIC_OR_DEPARTMENT_OTHER)
Admission: RE | Disposition: A | Discharge status: Home / Self Care | Payer: Self-pay | Source: Home / Self Care | Attending: Otolaryngology

## 2023-07-04 ENCOUNTER — Ambulatory Visit (HOSPITAL_BASED_OUTPATIENT_CLINIC_OR_DEPARTMENT_OTHER): Admitting: Anesthesiology

## 2023-07-04 ENCOUNTER — Ambulatory Visit (HOSPITAL_BASED_OUTPATIENT_CLINIC_OR_DEPARTMENT_OTHER)
Admission: RE | Admit: 2023-07-04 | Discharge: 2023-07-04 | Disposition: A | Discharge status: Home / Self Care | Attending: Otolaryngology | Admitting: Otolaryngology

## 2023-07-04 ENCOUNTER — Other Ambulatory Visit: Payer: Self-pay

## 2023-07-04 ENCOUNTER — Encounter (HOSPITAL_BASED_OUTPATIENT_CLINIC_OR_DEPARTMENT_OTHER): Payer: Self-pay | Admitting: Otolaryngology

## 2023-07-04 DIAGNOSIS — Z87891 Personal history of nicotine dependence: Secondary | ICD-10-CM | POA: Diagnosis not present

## 2023-07-04 DIAGNOSIS — H7292 Unspecified perforation of tympanic membrane, left ear: Secondary | ICD-10-CM | POA: Insufficient documentation

## 2023-07-04 DIAGNOSIS — Z6831 Body mass index (BMI) 31.0-31.9, adult: Secondary | ICD-10-CM | POA: Diagnosis not present

## 2023-07-04 DIAGNOSIS — E669 Obesity, unspecified: Secondary | ICD-10-CM | POA: Diagnosis not present

## 2023-07-04 DIAGNOSIS — H902 Conductive hearing loss, unspecified: Secondary | ICD-10-CM | POA: Diagnosis not present

## 2023-07-04 DIAGNOSIS — I10 Essential (primary) hypertension: Secondary | ICD-10-CM | POA: Diagnosis not present

## 2023-07-04 DIAGNOSIS — Z01812 Encounter for preprocedural laboratory examination: Secondary | ICD-10-CM

## 2023-07-04 DIAGNOSIS — H9012 Conductive hearing loss, unilateral, left ear, with unrestricted hearing on the contralateral side: Secondary | ICD-10-CM | POA: Insufficient documentation

## 2023-07-04 DIAGNOSIS — Z01818 Encounter for other preprocedural examination: Secondary | ICD-10-CM

## 2023-07-04 HISTORY — PX: TYMPANOPLASTY: SHX33

## 2023-07-04 SURGERY — TYMPANOPLASTY
Anesthesia: General | Site: Ear | Laterality: Left

## 2023-07-04 MED ORDER — PHENYLEPHRINE 80 MCG/ML (10ML) SYRINGE FOR IV PUSH (FOR BLOOD PRESSURE SUPPORT)
PREFILLED_SYRINGE | INTRAVENOUS | Status: AC
  Filled 2023-07-04: qty 10

## 2023-07-04 MED ORDER — LIDOCAINE-EPINEPHRINE 1 %-1:100000 IJ SOLN
INTRAMUSCULAR | Status: DC | PRN
  Administered 2023-07-04: 6 mL

## 2023-07-04 MED ORDER — DEXMEDETOMIDINE HCL IN NACL 80 MCG/20ML IV SOLN
INTRAVENOUS | Status: DC | PRN
  Administered 2023-07-04 (×2): 4 ug via INTRAVENOUS

## 2023-07-04 MED ORDER — LIDOCAINE-EPINEPHRINE 1 %-1:100000 IJ SOLN
INTRAMUSCULAR | Status: AC
  Filled 2023-07-04: qty 1

## 2023-07-04 MED ORDER — SUCCINYLCHOLINE CHLORIDE 200 MG/10ML IV SOSY
PREFILLED_SYRINGE | INTRAVENOUS | Status: DC | PRN
  Administered 2023-07-04: 140 mg via INTRAVENOUS

## 2023-07-04 MED ORDER — EPHEDRINE SULFATE (PRESSORS) 50 MG/ML IJ SOLN
INTRAMUSCULAR | Status: DC | PRN
  Administered 2023-07-04 (×3): 5 mg via INTRAVENOUS

## 2023-07-04 MED ORDER — FENTANYL CITRATE (PF) 100 MCG/2ML IJ SOLN
INTRAMUSCULAR | Status: DC | PRN
  Administered 2023-07-04: 100 ug via INTRAVENOUS

## 2023-07-04 MED ORDER — MIDAZOLAM HCL 2 MG/2ML IJ SOLN
INTRAMUSCULAR | Status: DC | PRN
  Administered 2023-07-04: 2 mg via INTRAVENOUS

## 2023-07-04 MED ORDER — FENTANYL CITRATE (PF) 100 MCG/2ML IJ SOLN: 25.0000 ug | INTRAMUSCULAR | Status: DC | PRN

## 2023-07-04 MED ORDER — LIDOCAINE 2% (20 MG/ML) 5 ML SYRINGE
INTRAMUSCULAR | Status: DC | PRN
  Administered 2023-07-04: 100 mg via INTRAVENOUS

## 2023-07-04 MED ORDER — ONDANSETRON HCL 4 MG/2ML IJ SOLN
INTRAMUSCULAR | Status: DC | PRN
  Administered 2023-07-04: 4 mg via INTRAVENOUS

## 2023-07-04 MED ORDER — EPHEDRINE 5 MG/ML INJ
INTRAVENOUS | Status: AC
Start: 2023-07-04 — End: ?
  Filled 2023-07-04: qty 5

## 2023-07-04 MED ORDER — EPINEPHRINE PF 1 MG/ML IJ SOLN
INTRAMUSCULAR | Status: AC
  Filled 2023-07-04: qty 2

## 2023-07-04 MED ORDER — CIPROFLOXACIN-DEXAMETHASONE 0.3-0.1 % OT SUSP
OTIC | Status: AC
  Filled 2023-07-04: qty 7.5

## 2023-07-04 MED ORDER — FENTANYL CITRATE (PF) 100 MCG/2ML IJ SOLN
INTRAMUSCULAR | Status: AC
  Filled 2023-07-04: qty 2

## 2023-07-04 MED ORDER — LACTATED RINGERS IV SOLN: INTRAVENOUS | Status: DC

## 2023-07-04 MED ORDER — PROPOFOL 10 MG/ML IV BOLUS
INTRAVENOUS | Status: DC | PRN
  Administered 2023-07-04: 30 mg via INTRAVENOUS
  Administered 2023-07-04: 170 mg via INTRAVENOUS
  Administered 2023-07-04: 30 mg via INTRAVENOUS

## 2023-07-04 MED ORDER — DEXAMETHASONE SODIUM PHOSPHATE 10 MG/ML IJ SOLN
INTRAMUSCULAR | Status: DC | PRN
  Administered 2023-07-04: 10 mg via INTRAVENOUS

## 2023-07-04 MED ORDER — BACITRACIN ZINC 500 UNIT/GM EX OINT
TOPICAL_OINTMENT | CUTANEOUS | Status: AC
  Filled 2023-07-04: qty 28.35

## 2023-07-04 MED ORDER — HYDROCODONE-ACETAMINOPHEN 5-325 MG PO TABS: 1.0000 | ORAL_TABLET | Freq: Four times a day (QID) | ORAL | 0 refills | Status: AC | PRN

## 2023-07-04 MED ORDER — BACITRACIN ZINC 500 UNIT/GM EX OINT
TOPICAL_OINTMENT | CUTANEOUS | Status: DC | PRN
  Administered 2023-07-04: 1 via TOPICAL

## 2023-07-04 MED ORDER — PHENYLEPHRINE HCL (PRESSORS) 10 MG/ML IV SOLN
INTRAVENOUS | Status: DC | PRN
  Administered 2023-07-04 (×2): 80 ug via INTRAVENOUS
  Administered 2023-07-04 (×2): 160 ug via INTRAVENOUS
  Administered 2023-07-04: 80 ug via INTRAVENOUS

## 2023-07-04 MED ORDER — ONDANSETRON 4 MG PO TBDP: 4.0000 mg | ORAL_TABLET | Freq: Three times a day (TID) | ORAL | 0 refills | Status: AC | PRN

## 2023-07-04 MED ORDER — GELATIN ABSORBABLE 100 EX MISC
CUTANEOUS | Status: DC | PRN
  Administered 2023-07-04: 1

## 2023-07-04 MED ORDER — AMISULPRIDE (ANTIEMETIC) 5 MG/2ML IV SOLN: 10.0000 mg | Freq: Once | INTRAVENOUS | Status: DC | PRN

## 2023-07-04 MED ORDER — CIPROFLOXACIN-DEXAMETHASONE 0.3-0.1 % OT SUSP
OTIC | Status: DC | PRN
  Administered 2023-07-04: 4 [drp] via OTIC

## 2023-07-04 MED ORDER — METHYLENE BLUE (ANTIDOTE) 1 % IV SOLN
INTRAVENOUS | Status: AC
  Filled 2023-07-04: qty 10

## 2023-07-04 MED ORDER — MIDAZOLAM HCL 2 MG/2ML IJ SOLN
INTRAMUSCULAR | Status: AC
  Filled 2023-07-04: qty 2

## 2023-07-04 MED ORDER — CIPROFLOXACIN-DEXAMETHASONE 0.3-0.1 % OT SUSP: 3.0000 [drp] | Freq: Three times a day (TID) | OTIC | 2 refills | Status: AC

## 2023-07-04 MED ORDER — ONDANSETRON HCL 4 MG/2ML IJ SOLN: 4.0000 mg | Freq: Once | INTRAMUSCULAR | Status: DC | PRN

## 2023-07-04 SURGICAL SUPPLY — 56 items
BENZOIN TINCTURE PRP APPL 2/3 (GAUZE/BANDAGES/DRESSINGS) IMPLANT
BLADE CLIPPER SURG (BLADE) IMPLANT
BLADE NDL 3 SS STRL (BLADE) IMPLANT
BLADE NEEDLE 3 SS STRL (BLADE) IMPLANT
BNDG GAUZE DERMACEA FLUFF 4 (GAUZE/BANDAGES/DRESSINGS) IMPLANT
BNDG STRETCH GAUZE 3IN X12FT (GAUZE/BANDAGES/DRESSINGS) IMPLANT
BUR RND OSTEON ELITE 6.0 (BURR) IMPLANT
BUR SABER RD CUTTING 3.0 (BURR) IMPLANT
CANISTER SUCT 1200ML W/VALVE (MISCELLANEOUS) ×1 IMPLANT
CLEANER CAUTERY TIP PAD (MISCELLANEOUS) ×1 IMPLANT
COTTONBALL LRG STERILE PKG (GAUZE/BANDAGES/DRESSINGS) ×1 IMPLANT
DERMABOND ADVANCED .7 DNX12 (GAUZE/BANDAGES/DRESSINGS) ×1 IMPLANT
DRAPE EENT STERILE 33X51 (DRAPES) IMPLANT
DRAPE INCISE 23X17 STRL (DRAPES) IMPLANT
DRAPE INCISE IOBAN 23X17 STRL (DRAPES) IMPLANT
DRAPE MICROSCOPE URBAN (DRAPES) ×1 IMPLANT
DRAPE MICROSCOPE WILD 40.5X102 (DRAPES) IMPLANT
DROPPER MEDICINE STER 1.5ML LF (MISCELLANEOUS) IMPLANT
DRSG GLASSCOCK MASTOID ADT (GAUZE/BANDAGES/DRESSINGS) IMPLANT
DRSG GLASSCOCK MASTOID PED (GAUZE/BANDAGES/DRESSINGS) IMPLANT
DRSG TELFA 3X8 NADH STRL (GAUZE/BANDAGES/DRESSINGS) IMPLANT
ELECT COATED BLADE 2.86 ST (ELECTRODE) ×1 IMPLANT
ELECT REM PT RETURN 9FT ADLT (ELECTROSURGICAL) ×1 IMPLANT
ELECTRODE REM PT RTRN 9FT ADLT (ELECTROSURGICAL) ×1 IMPLANT
GAUZE 4X4 16PLY ~~LOC~~+RFID DBL (SPONGE) IMPLANT
GAUZE SPONGE 4X4 12PLY STRL (GAUZE/BANDAGES/DRESSINGS) IMPLANT
GAUZE SPONGE 4X4 12PLY STRL LF (GAUZE/BANDAGES/DRESSINGS) IMPLANT
GLOVE ECLIPSE 7.5 STRL STRAW (GLOVE) ×1 IMPLANT
GLOVE INDICATOR 7.5 STRL GRN (GLOVE) IMPLANT
GLOVE SURG SS PI 7.0 STRL IVOR (GLOVE) IMPLANT
GOWN STRL REUS W/ TWL LRG LVL3 (GOWN DISPOSABLE) ×1 IMPLANT
GOWN STRL REUS W/ TWL XL LVL3 (GOWN DISPOSABLE) ×1 IMPLANT
IV CATH AUTO 14GX1.75 SAFE ORG (IV SOLUTION) IMPLANT
IV SET EXT 30 76VOL 4 MALE LL (IV SETS) ×1 IMPLANT
NDL PRECISIONGLIDE 27X1.5 (NEEDLE) ×1 IMPLANT
NDL SAFETY ECLIPSE 18X1.5 (NEEDLE) ×1 IMPLANT
NEEDLE PRECISIONGLIDE 27X1.5 (NEEDLE) ×1 IMPLANT
NS IRRIG 1000ML POUR BTL (IV SOLUTION) ×1 IMPLANT
PACK BASIN DAY SURGERY FS (CUSTOM PROCEDURE TRAY) ×1 IMPLANT
PACK ENT DAY SURGERY (CUSTOM PROCEDURE TRAY) ×1 IMPLANT
PENCIL FOOT CONTROL (ELECTRODE) ×1 IMPLANT
SHEET MEDIUM DRAPE 40X70 STRL (DRAPES) IMPLANT
SLEEVE SCD COMPRESS KNEE MED (STOCKING) IMPLANT
SPIKE FLUID TRANSFER (MISCELLANEOUS) ×1 IMPLANT
SPONGE SURGIFOAM ABS GEL 12-7 (HEMOSTASIS) ×1 IMPLANT
STRIP CLOSURE SKIN 1/2X4 (GAUZE/BANDAGES/DRESSINGS) IMPLANT
SUT CHROMIC 3 0 PS 2 (SUTURE) IMPLANT
SUT CHROMIC 4 0 P 3 18 (SUTURE) IMPLANT
SUT NYLON ETHILON 5-0 P-3 1X18 (SUTURE) IMPLANT
SUT VIC AB 3-0 FS2 27 (SUTURE) IMPLANT
SUT VICRYL RAPIDE 4-0 (SUTURE) IMPLANT
SYR 5ML LL (SYRINGE) IMPLANT
SYR BULB EAR ULCER 3OZ GRN STR (SYRINGE) IMPLANT
TOWEL GREEN STERILE FF (TOWEL DISPOSABLE) ×1 IMPLANT
TRAY DSU PREP LF (CUSTOM PROCEDURE TRAY) ×1 IMPLANT
TUBING IRRIGATION (MISCELLANEOUS) IMPLANT

## 2023-07-04 NOTE — Discharge Instructions (Addendum)
 Remove dressing on Tuesday.  Undo the Velcro strap and the entire dressing comes off.  Remove the adhesive pad from the forehead.  This thin dressing behind the ear can be removed.  Take the cottonball out of the ear place the eardrops and then replace a fresh cottonball.  Repeat that 3 times daily.  Avoid heavy lifting.  Avoid blowing the nose.  Open-mouth if sneezing.  Avoid any strenuous activity.    Post Anesthesia Home Care Instructions  Activity: Get plenty of rest for the remainder of the day. A responsible individual must stay with you for 24 hours following the procedure.  For the next 24 hours, DO NOT: -Drive a car -Advertising copywriter -Drink alcoholic beverages -Take any medication unless instructed by your physician -Make any legal decisions or sign important papers.  Meals: Start with liquid foods such as gelatin or soup. Progress to regular foods as tolerated. Avoid greasy, spicy, heavy foods. If nausea and/or vomiting occur, drink only clear liquids until the nausea and/or vomiting subsides. Call your physician if vomiting continues.  Special Instructions/Symptoms: Your throat may feel dry or sore from the anesthesia or the breathing tube placed in your throat during surgery. If this causes discomfort, gargle with warm salt water. The discomfort should disappear within 24 hours.  If you had a scopolamine patch placed behind your ear for the management of post- operative nausea and/or vomiting:  1. The medication in the patch is effective for 72 hours, after which it should be removed.  Wrap patch in a tissue and discard in the trash. Wash hands thoroughly with soap and water. 2. You may remove the patch earlier than 72 hours if you experience unpleasant side effects which may include dry mouth, dizziness or visual disturbances. 3. Avoid touching the patch. Wash your hands with soap and water after contact with the patch.

## 2023-07-04 NOTE — Transfer of Care (Signed)
 Immediate Anesthesia Transfer of Care Note  Patient: Leon Nichols  Procedure(s) Performed: LEFT TYMPANOPLASTY (Left: Ear)  Patient Location: PACU  Anesthesia Type:General  Level of Consciousness: drowsy  Airway & Oxygen Therapy: Patient Spontanous Breathing and Patient connected to face mask oxygen  Post-op Assessment: Report given to RN and Post -op Vital signs reviewed and stable  Post vital signs: Reviewed and stable  Last Vitals:  Vitals Value Taken Time  BP 135/96   Temp    Pulse 87   Resp 16   SpO2 92%     Last Pain:  Vitals:   07/04/23 0727  TempSrc: Oral  PainSc: 0-No pain      Patients Stated Pain Goal: 7 (07/04/23 0727)  Complications: No notable events documented.

## 2023-07-04 NOTE — Anesthesia Postprocedure Evaluation (Signed)
 Anesthesia Post Note  Patient: Leon Nichols  Procedure(s) Performed: LEFT TYMPANOPLASTY (Left: Ear)     Patient location during evaluation: PACU Anesthesia Type: General Level of consciousness: awake and alert Pain management: pain level controlled Vital Signs Assessment: post-procedure vital signs reviewed and stable Respiratory status: spontaneous breathing, nonlabored ventilation and respiratory function stable Cardiovascular status: blood pressure returned to baseline and stable Postop Assessment: no apparent nausea or vomiting Anesthetic complications: no   No notable events documented.  Last Vitals:  Vitals:   07/04/23 1015 07/04/23 1047  BP: (!) 130/94 (!) 135/99  Pulse: 85 84  Resp: (!) 24 20  Temp:  (!) 36.3 C  SpO2: 96% 94%    Last Pain:  Vitals:   07/04/23 1047  TempSrc: Temporal  PainSc:                  Collene Schlichter

## 2023-07-04 NOTE — Op Note (Signed)
 OPERATIVE REPORT  DATE OF SURGERY: 07/04/2023  PATIENT:  Leon Nichols,  58 y.o. male  PRE-OPERATIVE DIAGNOSIS:  Conductive hearing loss of left ear, unspecified hearing status on contralateral side, Perforation of left tympanic membrane  POST-OPERATIVE DIAGNOSIS:  Conductive hearing loss of left ear, unspecified hearing status on contralateral side, Perforation of left tympanic membrane  PROCEDURE:  Procedure(s): LEFT TYMPANOPLASTY  SURGEON:  Susy Frizzle, MD  ASSISTANTS: None  ANESTHESIA:   General   EBL: 100 ml  DRAINS: None  LOCAL MEDICATIONS USED: 1% Xylocaine with epinephrine  SPECIMEN:  none  COUNTS:  Correct  PROCEDURE DETAILS: The patient was taken to the operating room and placed on the operating table in the supine position. Following induction of general endotracheal anesthesia, the left ear was prepped and draped in a standard fashion.  Preoperative microscope was draped and used throughout the procedure.  Ear canal was inspected and the perforation was identified.  It was a near-total pars tensa perforation.  Incudostapedial joint was easily visualized.  Malleus was not.  4 quadrants of the external auditory canal were infiltrated with local anesthetic solution.  Radial incisions were created at 4:00 and 8:00.  Vascular strip was created with a round knife.  This was elevated off the canal wall.  Postauricular incision was infiltrated with local anesthetic and electrocautery was used to incise the skin and subcutaneous tissue.  Loose areolar tissue was harvested from lateral to the temporalis fascia pressed and dried on the back table and used for graft.  The linea temporalis was incised down to the bone and a T incision was created.  Periosteum was elevated anteriorly.  A Perkins retractor was used to retract the auricle.  Otologic drill was then used to take down some bony prominences along the posterior canal wall to facilitate exposure.  The mastoid was not  entered.  Perforation was freshened using a sharp pick and cup forceps to remove the rim of the perforation in all directions.  Tympanomeatal flap was then elevated and brought forward.  Middle ear was inspected and the mucosa was all thickened and fibrotic but there is no active epithelial debris or infection.  A small round knife was used to elevate the epithelium off of the annulus and all directions.  Middle ear was packed with saline soaked Gelfoam including the eustachian tube orifice.  The graft was trimmed and placed into a underlay position.  Additional packing was placed to assure that the edges of the annulus and the perforation were lying on top of the graft.  Once the graft was well-seated the left was packed with Ciprodex soaked Gelfoam and the flap was brought back to its native position.  Additional packing was placed.  The postauricular incision was reapproximated with a running 4-0 chromic suture.  Dermabond was used on the skin.  Additional packing was placed at the extra meatus ensuring that the ear canal skin was down on the bone.  Glasscock dressing was applied.  Patient was awakened extubated and transferred to recovery in stable condition.    PATIENT DISPOSITION:  To PACU, stable

## 2023-07-04 NOTE — Interval H&P Note (Signed)
 History and Physical Interval Note:  07/04/2023 7:42 AM  Leon Nichols  has presented today for surgery, with the diagnosis of Conductive hearing loss of left ear, unspecified hearing status on contralateral side Perforation of left tympanic membrane.  The various methods of treatment have been discussed with the patient and family. After consideration of risks, benefits and other options for treatment, the patient has consented to  Procedure(s): TYMPANOPLASTY (Left) as a surgical intervention.  The patient's history has been reviewed, patient examined, no change in status, stable for surgery.  I have reviewed the patient's chart and labs.  Questions were answered to the patient's satisfaction.     Serena Colonel

## 2023-07-04 NOTE — Anesthesia Preprocedure Evaluation (Signed)
 Anesthesia Evaluation  Patient identified by MRN, date of birth, ID band Patient awake    Reviewed: Allergy & Precautions, NPO status , Patient's Chart, lab work & pertinent test results  Airway Mallampati: II  TM Distance: >3 FB Neck ROM: Full    Dental  (+) Teeth Intact, Dental Advisory Given   Pulmonary former smoker   Pulmonary exam normal breath sounds clear to auscultation       Cardiovascular hypertension, Pt. on medications (-) angina (-) Past MI Normal cardiovascular exam Rhythm:Regular Rate:Normal     Neuro/Psych  Headaches    GI/Hepatic negative GI ROS,,,(+)     substance abuse  alcohol use  Endo/Other  negative endocrine ROS  Obesity   Renal/GU negative Renal ROS     Musculoskeletal  (+) Arthritis ,    Abdominal   Peds  Hematology negative hematology ROS (+)   Anesthesia Other Findings Day of surgery medications reviewed with the patient.  Reproductive/Obstetrics                             Anesthesia Physical Anesthesia Plan  ASA: 2  Anesthesia Plan: General   Post-op Pain Management: Tylenol PO (pre-op)*   Induction: Intravenous  PONV Risk Score and Plan: 3 and Midazolam, Ondansetron and Dexamethasone  Airway Management Planned: Oral ETT  Additional Equipment:   Intra-op Plan:   Post-operative Plan: Extubation in OR  Informed Consent: I have reviewed the patients History and Physical, chart, labs and discussed the procedure including the risks, benefits and alternatives for the proposed anesthesia with the patient or authorized representative who has indicated his/her understanding and acceptance.     Dental advisory given  Plan Discussed with: CRNA  Anesthesia Plan Comments:        Anesthesia Quick Evaluation

## 2023-07-04 NOTE — Anesthesia Procedure Notes (Signed)
 Procedure Name: Intubation Date/Time: 07/04/2023 8:17 AM  Performed by: Alvera Novel, CRNAPre-anesthesia Checklist: Patient identified, Emergency Drugs available, Suction available and Patient being monitored Patient Re-evaluated:Patient Re-evaluated prior to induction Oxygen Delivery Method: Circle System Utilized Preoxygenation: Pre-oxygenation with 100% oxygen Induction Type: IV induction Ventilation: Mask ventilation without difficulty Laryngoscope Size: Mac and 4 Grade View: Grade I Tube type: Oral Number of attempts: 1 Airway Equipment and Method: Stylet and Oral airway Placement Confirmation: ETT inserted through vocal cords under direct vision, positive ETCO2 and breath sounds checked- equal and bilateral Secured at: 23 cm Tube secured with: Tape Dental Injury: Teeth and Oropharynx as per pre-operative assessment

## 2023-07-05 ENCOUNTER — Encounter (HOSPITAL_BASED_OUTPATIENT_CLINIC_OR_DEPARTMENT_OTHER): Payer: Self-pay | Admitting: Otolaryngology

## 2023-07-07 ENCOUNTER — Ambulatory Visit: Admitting: Internal Medicine

## 2023-08-03 NOTE — Progress Notes (Signed)
 The ASCVD Risk score (Arnett DK, et al., 2019) failed to calculate for the following reasons:   The valid total cholesterol range is 130 to 320 mg/dL  [name], BSN, RN

## 2023-08-15 ENCOUNTER — Ambulatory Visit: Admitting: Internal Medicine

## 2023-08-23 ENCOUNTER — Other Ambulatory Visit: Payer: Self-pay | Admitting: Internal Medicine

## 2023-08-23 DIAGNOSIS — B20 Human immunodeficiency virus [HIV] disease: Secondary | ICD-10-CM

## 2023-08-23 NOTE — Telephone Encounter (Signed)
Appt 5/21.

## 2023-08-24 ENCOUNTER — Ambulatory Visit: Admitting: Internal Medicine

## 2023-08-29 ENCOUNTER — Emergency Department (HOSPITAL_COMMUNITY)
Admission: EM | Admit: 2023-08-29 | Discharge: 2023-08-29 | Disposition: A | Discharge status: Home / Self Care | Attending: Emergency Medicine | Admitting: Emergency Medicine

## 2023-08-29 ENCOUNTER — Other Ambulatory Visit: Payer: Self-pay

## 2023-08-29 DIAGNOSIS — Z79899 Other long term (current) drug therapy: Secondary | ICD-10-CM | POA: Insufficient documentation

## 2023-08-29 DIAGNOSIS — K9423 Gastrostomy malfunction: Secondary | ICD-10-CM | POA: Insufficient documentation

## 2023-08-29 DIAGNOSIS — K9400 Colostomy complication, unspecified: Secondary | ICD-10-CM

## 2023-08-29 DIAGNOSIS — Z21 Asymptomatic human immunodeficiency virus [HIV] infection status: Secondary | ICD-10-CM | POA: Diagnosis not present

## 2023-08-29 DIAGNOSIS — I10 Essential (primary) hypertension: Secondary | ICD-10-CM | POA: Insufficient documentation

## 2023-08-29 NOTE — ED Triage Notes (Signed)
 Pt reports swollen stoma x1-2 weeks ever since starting tylenol , even more after aleve. Denies pain

## 2023-08-29 NOTE — Discharge Instructions (Signed)
 Schedule an appointment with the general surgeon for further evaluation.  Return to the emergency room if you start having severe swelling pain vomiting or other concerns.  Your blood pressure was also elevated today.  Make sure to take your medications regularly and follow-up with your primary care doctor to have that rechecked

## 2023-08-29 NOTE — ED Provider Notes (Signed)
 [facility] AT [facility] Provider Note   CSN: [id number] Arrival date & time: 08/29/23  7829     History    Leon Nichols is a 58 y.o. male.  HPI   Patient has history of HIV disease, hypertension, pulmonary embolism, DVT, colostomy.  Patient had a colon resection and end colostomy procedure on January 8.  Patient states he had been doing well.  In the last week or 2 he has noticed some intermittent swelling of his colostomy where it has been difficult to apply the colostomy bag.  Patient is not having abdominal pain.  No fevers.  Patient noticed it was more swollen last night.  It is a bit better this morning after applying some ice.  No fevers chills no vomiting no other concerns  Home Medications Prior to Admission medications   Medication Sig Start Date End Date Taking? Authorizing Provider  acetaminophen  (TYLENOL ) 500 MG tablet Take 2 tablets (1,000 mg total) by mouth every 6 (six) hours. 04/18/23   Audria Leather, MD  amLODipine  (NORVASC ) 10 MG tablet TAKE 1 TABLET(10 MG) BY MOUTH DAILY 12/15/21   Liane Redman, MD  CALCIUM  PO Take by mouth.    [provider]  gabapentin  (NEURONTIN ) 100 MG capsule Take 100 mg by mouth at bedtime.    [provider]  HYDROcodone -acetaminophen  (NORCO/VICODIN) 5-325 MG tablet Take 1 tablet by mouth every 6 (six) hours as needed for moderate pain (pain score 4-6). 07/04/23   Janita Mellow, MD  lisinopril  (ZESTRIL ) 20 MG tablet TAKE 1 TABLET(20 MG) BY MOUTH DAILY 03/24/23   Liane Redman, MD  methocarbamol  (ROBAXIN ) 500 MG tablet Take 1 tablet (500 mg total) by mouth every 6 (six) hours as needed for muscle spasms. 04/18/23   Enid Harry, MD  ondansetron  (ZOFRAN ) 4 MG tablet Take 1 tablet (4 mg total) by mouth every 6 (six) hours as needed for refractory nausea / vomiting. 04/18/23   Audria Leather, MD  ondansetron  (ZOFRAN -ODT) 4 MG disintegrating tablet Take 1 tablet (4 mg total) by mouth every  8 (eight) hours as needed for nausea or vomiting. 07/04/23   Janita Mellow, MD  oxyCODONE  (OXY IR/ROXICODONE ) 5 MG immediate release tablet Take 1 tablet (5 mg total) by mouth every 4 (four) hours as needed for moderate pain (pain score 4-6) or severe pain (pain score 7-10) (5 mg for 4-6/10 pain. 10 mg for >6/10 pain). 04/18/23   Enid Harry, MD  pantoprazole  (PROTONIX ) 40 MG tablet Take 1 tablet (40 mg total) by mouth daily. Patient not taking: Reported on 01/26/2023 12/15/21   Liane Redman, MD  potassium chloride  SA (KLOR-CON  M) 20 MEQ tablet Take 1 tablet (20 mEq total) by mouth 2 (two) times daily. 04/18/23   Audria Leather, MD  rosuvastatin  (CRESTOR ) 10 MG tablet TAKE 1 TABLET(10 MG) BY MOUTH DAILY 03/24/23   Liane Redman, MD  TRIUMEQ  600-50-300 MG tablet TAKE 1 TABLET BY MOUTH DAILY 08/24/23   Liane Redman, MD  VITAMIN D , CHOLECALCIFEROL, PO Take by mouth.    [provider]      Allergies    Patient has no known allergies.    Review of Systems   Review of Systems  Physical Exam Updated Vital Signs BP (!) 171/115 (BP Location: Left Arm)   Pulse 83   Temp 97.9 F (36.6 C) (Oral)   Resp 16   Ht 1.854 m (6\' 1" )   Wt 113.4 kg   SpO2 99%   BMI 32.98  kg/m  Physical Exam Vitals and nursing note reviewed.  Constitutional:      General: He is not in acute distress.    Appearance: He is well-developed.  HENT:     Head: Normocephalic and atraumatic.     Right Ear: External ear normal.     Left Ear: External ear normal.  Eyes:     General: No scleral icterus.       Right eye: No discharge.        Left eye: No discharge.     Conjunctiva/sclera: Conjunctivae normal.  Neck:     Trachea: No tracheal deviation.  Cardiovascular:     Rate and Rhythm: Normal rate.  Pulmonary:     Effort: Pulmonary effort is normal. No respiratory distress.     Breath sounds: No stridor.  Abdominal:     General: There is no distension.     Tenderness: There is no abdominal  tenderness. There is no guarding.     Comments: Colostomy stoma left lower quadrant, no tenderness to palpation, no surrounding erythema, slight prolapse noted  Musculoskeletal:        General: No swelling or deformity.     Cervical back: Neck supple.  Skin:    General: Skin is warm and dry.     Findings: No rash.  Neurological:     Mental Status: He is alert. Mental status is at baseline.     Cranial Nerves: No dysarthria or facial asymmetry.     Motor: No seizure activity.     ED Results / Procedures / Treatments   Labs (all labs ordered are listed, but only abnormal results are displayed) Labs Reviewed - No data to display  EKG None  Radiology No results found.  Procedures Procedures    Medications Ordered in ED Medications - No data to display  ED Course/ Medical Decision Making/ A&P                                 Medical Decision Making  I was able to apply gentle pressure to the stoma of the colostomy.  Patient was then able to apply the colostomy bag without difficulty.  He is currently not have any pain or discomfort.  No fevers.  It is possible patient may be developing prolapse/hernia.  Currently no evidence of obstruction is easily reduced.  I will have him follow-up with his general surgeon as an outpatient for further treatment.  Warning signs precautions discussed.  Patient blood pressure also noted to be elevated today.  He is asymptomatic.  Recommend he continue take his medications and follow-up with his primary care doctor to be rechecked.        Final Clinical Impression(s) / ED Diagnoses Final diagnoses:  Colostomy complication (HCC)  Hypertension, unspecified type    Rx / DC Orders ED Discharge Orders     None         Trish Furl, MD 08/29/23 1014

## 2023-09-20 ENCOUNTER — Ambulatory Visit: Admitting: Family

## 2023-09-25 ENCOUNTER — Other Ambulatory Visit: Payer: Self-pay | Admitting: Internal Medicine

## 2023-09-26 ENCOUNTER — Other Ambulatory Visit: Payer: Self-pay

## 2023-09-26 DIAGNOSIS — B20 Human immunodeficiency virus [HIV] disease: Secondary | ICD-10-CM

## 2023-09-26 MED ORDER — TRIUMEQ 600-50-300 MG PO TABS: 1.0000 | ORAL_TABLET | Freq: Every day | ORAL | 0 refills | Status: DC

## 2023-09-26 NOTE — Telephone Encounter (Signed)
 Called patient, he states his PCP is managing these medications now.   Traci Gafford, BSN, RN

## 2023-10-14 ENCOUNTER — Other Ambulatory Visit: Payer: Self-pay

## 2023-10-14 ENCOUNTER — Encounter: Payer: Self-pay | Admitting: Family

## 2023-10-14 ENCOUNTER — Ambulatory Visit (INDEPENDENT_AMBULATORY_CARE_PROVIDER_SITE_OTHER): Admitting: Family

## 2023-10-14 VITALS — BP 112/79 | HR 79 | Temp 98.3°F | Ht 73.0 in | Wt 265.0 lb

## 2023-10-14 DIAGNOSIS — Z23 Encounter for immunization: Secondary | ICD-10-CM | POA: Diagnosis not present

## 2023-10-14 DIAGNOSIS — B20 Human immunodeficiency virus [HIV] disease: Secondary | ICD-10-CM

## 2023-10-14 DIAGNOSIS — I10 Essential (primary) hypertension: Secondary | ICD-10-CM

## 2023-10-14 DIAGNOSIS — Z0000 Encounter for general adult medical examination without abnormal findings: Secondary | ICD-10-CM | POA: Insufficient documentation

## 2023-10-14 MED ORDER — ROSUVASTATIN CALCIUM 10 MG PO TABS: 10.0000 mg | ORAL_TABLET | Freq: Every day | ORAL | 6 refills | Status: AC

## 2023-10-14 MED ORDER — TRIUMEQ 600-50-300 MG PO TABS: 1.0000 | ORAL_TABLET | Freq: Every day | ORAL | 6 refills | Status: AC

## 2023-10-14 MED ORDER — LISINOPRIL 20 MG PO TABS: 20.0000 mg | ORAL_TABLET | Freq: Every day | ORAL | 6 refills | Status: AC

## 2023-10-14 MED ORDER — AMLODIPINE BESYLATE 10 MG PO TABS: ORAL_TABLET | ORAL | 6 refills | Status: AC

## 2023-10-14 NOTE — Assessment & Plan Note (Signed)
 Not currently sexually active. Vaccinations reviewed and following counseling update tetanus and Prevnar 20. Routine dental care up-to-date.   Colon cancer screening up-to-date.

## 2023-10-14 NOTE — Assessment & Plan Note (Signed)
 Blood pressure well-controlled and below goal 130/80 with current dose of amlodipine  and lisinopril .  No adverse side effects of medication.  No neurologic/ophthalmologic signs/symptoms.  Encouraged to continue to monitor blood pressure at home as able and follow low-sodium/Dash type diet.  Continue current dose of amlodipine  and lisinopril .

## 2023-10-14 NOTE — Patient Instructions (Signed)
 Nice to see you. ? ?We will check your lab work today. ? ?Continue to take your medication daily as prescribed. ? ?Refills have been sent to the pharmacy. ? ?Plan for follow up in 6 months or sooner if needed with lab work on the same day. ? ?Have a great day and stay safe! ? ?

## 2023-10-14 NOTE — Assessment & Plan Note (Signed)
 Leon Nichols continues to have well-controlled virus with good adherence and tolerance to Triumeq .  Reviewed previous lab work and discussed plan of care and U equals U.  No problems obtaining medication from the pharmacy and covered by [employer] and Medicaid.  Social determinants of health reviewed with no interventions indicated.  Check blood work.  Continue current dose of Triumeq .  Plan for follow-up in 6 months or sooner if needed with lab work on the same day.

## 2023-10-14 NOTE — Progress Notes (Signed)
 Brief Narrative   Patient ID: Leon Nichols, male    DOB: [birth year], 58 y.o.   MRN: [id number]  Leon Nichols is a 58 y/o AA diagnosed with HIV disease in December 2013 with risk factor of drug use and heterosexual partners. Initial CD4 count 170 with viral load 61,754. Entered care at [facility] Stage 3. Genotype with no significant medication resistant mutations. No history of opportunistic infection. ART experienced with Tivicay /Truvada ; Tivicay /Epzicom ; and Triumeq .   Subjective:   Chief Complaint  Patient presents with   Follow-up    6 month F/U B20 no concerns, needs refills on all medications     HPI:  Leon Nichols is a 58 y.o. male with HIV disease last seen by Dr. Luiz on 01/26/2023 with well-controlled virus and good adherence and tolerance to Triumeq .  Viral load was undetectable with CD4 count of 731.  Kidney function, liver function, electrolytes within normal ranges.  Here today for routine follow-up.  Leon Nichols has been doing well since his last office visit and continues to take Triumeq  as prescribed with no adverse side effects or problems obtaining medication from the pharmacy.  Covered by [employer] and Medicaid.  Continues to take his blood pressure medications with no adverse side effects.  No new concerns/complaints.  Has noted increased sugar intake and has questions about glucose levels as he has been consuming high with ice cream at an increasing frequency lately.  Housing, transportation, and access to food are stable.  Healthcare maintenance reviewed.  Routine dental care up-to-date.  Not currently sexually active.  Denies fevers, chills, night sweats, headaches, changes in vision, neck pain/stiffness, nausea, diarrhea, vomiting, lesions or rashes.  Lab Results  Component Value Date   CD4TCELL 26 (L) 01/26/2023   CD4TABS 731 01/26/2023   Lab Results  Component Value Date   HIV1RNAQUANT Not Detected 01/26/2023     No Known  Allergies    Outpatient Medications Prior to Visit  Medication Sig Dispense Refill   acetaminophen  (TYLENOL ) 500 MG tablet Take 2 tablets (1,000 mg total) by mouth every 6 (six) hours.     CALCIUM  PO Take by mouth.     gabapentin  (NEURONTIN ) 100 MG capsule Take 100 mg by mouth at bedtime.     HYDROcodone -acetaminophen  (NORCO/VICODIN) 5-325 MG tablet Take 1 tablet by mouth every 6 (six) hours as needed for moderate pain (pain score 4-6). 16 tablet 0   methocarbamol  (ROBAXIN ) 500 MG tablet Take 1 tablet (500 mg total) by mouth every 6 (six) hours as needed for muscle spasms. 30 tablet 0   ondansetron  (ZOFRAN ) 4 MG tablet Take 1 tablet (4 mg total) by mouth every 6 (six) hours as needed for refractory nausea / vomiting. 20 tablet 0   ondansetron  (ZOFRAN -ODT) 4 MG disintegrating tablet Take 1 tablet (4 mg total) by mouth every 8 (eight) hours as needed for nausea or vomiting. 20 tablet 0   oxyCODONE  (OXY IR/ROXICODONE ) 5 MG immediate release tablet Take 1 tablet (5 mg total) by mouth every 4 (four) hours as needed for moderate pain (pain score 4-6) or severe pain (pain score 7-10) (5 mg for 4-6/10 pain. 10 mg for >6/10 pain). 15 tablet 0   potassium chloride  SA (KLOR-CON  M) 20 MEQ tablet Take 1 tablet (20 mEq total) by mouth 2 (two) times daily. 30 tablet 0   VITAMIN D , CHOLECALCIFEROL, PO Take by mouth.     abacavir -dolutegravir -lamiVUDine  (TRIUMEQ ) 600-50-300 MG tablet Take 1 tablet by mouth daily. 30  tablet 0   amLODipine  (NORVASC ) 10 MG tablet TAKE 1 TABLET(10 MG) BY MOUTH DAILY 30 tablet 11   lisinopril  (ZESTRIL ) 20 MG tablet TAKE 1 TABLET(20 MG) BY MOUTH DAILY 30 tablet 5   pantoprazole  (PROTONIX ) 40 MG tablet Take 1 tablet (40 mg total) by mouth daily. 30 tablet 11   rosuvastatin  (CRESTOR ) 10 MG tablet TAKE 1 TABLET(10 MG) BY MOUTH DAILY 30 tablet 5   No facility-administered medications prior to visit.     Past Medical History:  Diagnosis Date   Acute back pain 10/28/2016   Colitis     Diverticulosis    DVT (deep venous thrombosis) (HCC)    Hemorrhoids    History of shingles    HIV (human immunodeficiency virus infection) (HCC) 03/24/2012   Hypertension    Perianal abscess 02/06/2015   Perirectal abscess    Septic arthritis (HCC) 02/05/2015     Past Surgical History:  Procedure Laterality Date   arthroscopic wash out of right knee     BIOPSY  04/11/2023   Procedure: BIOPSY;  Surgeon: Burnette Fallow, MD;  Location: [facility];  Service: Gastroenterology;;   COLONOSCOPY  08/31/2011   Procedure: COLONOSCOPY;  Surgeon: Norleen LOISE Kiang, MD;  Location: [facility];  Service: Endoscopy;  Laterality: N/A;   COLONOSCOPY WITH PROPOFOL  N/A 02/28/2021   Procedure: COLONOSCOPY WITH PROPOFOL ;  Surgeon: Burnette Fallow, MD;  Location: [facility];  Service: Endoscopy;  Laterality: N/A;   COLOSTOMY CLOSURE N/A 10/27/2022   Procedure: OPEN CLOSURE OF LOOP COLOSTOMY;  Surgeon: Debby Hila, MD;  Location: [facility];  Service: General;  Laterality: N/A;   DRESSING CHANGE UNDER ANESTHESIA  03/27/2012   Procedure: DRESSING CHANGE UNDER ANESTHESIA;  Surgeon: Sherlean JINNY Laughter, MD;  Location: [facility] OR;  Service: General;  Laterality: N/A;   ESOPHAGOGASTRODUODENOSCOPY (EGD) WITH PROPOFOL  N/A 02/28/2021   Procedure: ESOPHAGOGASTRODUODENOSCOPY (EGD) WITH PROPOFOL ;  Surgeon: Burnette Fallow, MD;  Location: [facility];  Service: Endoscopy;  Laterality: N/A;   FLEXIBLE SIGMOIDOSCOPY Left 04/11/2023   Procedure: FLEXIBLE SIGMOIDOSCOPY;  Surgeon: Burnette Fallow, MD;  Location: [facility];  Service: Gastroenterology;  Laterality: Left;   INCISION AND DRAINAGE PERIRECTAL ABSCESS  03/23/2012   Procedure: IRRIGATION AND DEBRIDEMENT PERIRECTAL ABSCESS;  Surgeon: Morene ONEIDA Olives, MD;  Location: [facility] OR;  Service: General;  Laterality: N/A;  I & D soft tissue scrotal and perineal abscess   IR IVC FILTER PLMT / S&I PORTER GUID/MOD SED  02/27/2021   KNEE ARTHROSCOPY Right 02/05/2015   Procedure:  ARTHROSCOPIC [facility] OUT RIGHT KNEE;  Surgeon: Evalene JONETTA Chancy, MD;  Location: [facility] OR;  Service: Orthopedics;  Laterality: Right;   LAPAROSCOPIC DIVERTED COLOSTOMY  03/27/2012   Procedure: LAPAROSCOPIC DIVERTED COLOSTOMY;  Surgeon: Sherlean JINNY Laughter, MD;  Location: [facility] OR;  Service: General;  Laterality: N/A;   LAPAROTOMY N/A 04/13/2023   Procedure: EXPLORATORY LAPAROTOMY, COLON RESECTION, END COLOSTOMY;  Surgeon: Vernetta Berg, MD;  Location: [facility];  Service: General;  Laterality: N/A;   TYMPANOPLASTY Left 07/04/2023   Procedure: LEFT TYMPANOPLASTY;  Surgeon: Jesus Oliphant, MD;  Location: [facility];  Service: ENT;  Laterality: Left;        Review of Systems  Constitutional:  Negative for appetite change, chills, fatigue, fever and unexpected weight change.  Eyes:  Negative for visual disturbance.  Respiratory:  Negative for cough, chest tightness, shortness of breath and wheezing.   Cardiovascular:  Negative for chest pain and leg swelling.  Gastrointestinal:  Negative for abdominal pain, constipation, diarrhea, nausea  and vomiting.  Genitourinary:  Negative for dysuria, flank pain, frequency, genital sores, hematuria and urgency.  Skin:  Negative for rash.  Allergic/Immunologic: Negative for immunocompromised state.  Neurological:  Negative for dizziness and headaches.     Objective:   BP 112/79   Pulse 79   Temp 98.3 F (36.8 C) (Temporal)   Ht 6' 1 (1.854 m)   Wt 265 lb (120.2 kg)   SpO2 98%   BMI 34.96 kg/m  Nursing note and vital signs reviewed.  Physical Exam Constitutional:      General: He is not in acute distress.    Appearance: He is well-developed.  Eyes:     Conjunctiva/sclera: Conjunctivae normal.  Cardiovascular:     Rate and Rhythm: Normal rate and regular rhythm.     Heart sounds: Normal heart sounds. No murmur heard.    No friction rub. No gallop.  Pulmonary:     Effort: Pulmonary effort is normal. No respiratory distress.     Breath  sounds: Normal breath sounds. No wheezing or rales.  Chest:     Chest wall: No tenderness.  Abdominal:     General: Bowel sounds are normal.     Palpations: Abdomen is soft.     Tenderness: There is no abdominal tenderness.  Musculoskeletal:     Cervical back: Neck supple.  Lymphadenopathy:     Cervical: No cervical adenopathy.  Skin:    General: Skin is warm and dry.     Findings: No rash.  Neurological:     Mental Status: He is alert and oriented to person, place, and time.  Psychiatric:        Behavior: Behavior normal.        Thought Content: Thought content normal.        Judgment: Judgment normal.          10/14/2023    9:41 AM 03/18/2022    3:44 PM 07/08/2020    4:29 PM 04/18/2019    3:59 PM 02/20/2019    3:55 PM  Depression screen PHQ 2/9  Decreased Interest 0 0 0 0 0  Down, Depressed, Hopeless 0 0 0 0 0  PHQ - 2 Score 0 0 0 0 0        10/14/2023    9:41 AM  GAD 7 : Generalized Anxiety Score  Nervous, Anxious, on Edge 0  Control/stop worrying 0  Worry too much - different things 0  Trouble relaxing 0  Restless 0  Easily annoyed or irritable 0  Afraid - awful might happen 0  Total GAD 7 Score 0  Anxiety Difficulty Not difficult at all     The ASCVD Risk score (Arnett DK, et al., 2019) failed to calculate for the following reasons:   The valid total cholesterol range is 130 to 320 mg/dL      Assessment & Plan:    Patient Active Problem List   Diagnosis Date Noted   Healthcare maintenance 10/14/2023   Colon obstruction (HCC) 04/09/2023   History of pulmonary embolism 04/09/2023   History of DVT (deep vein thrombosis) 04/09/2023   Hypokalemia 04/09/2023   Hyperlipidemia 04/09/2023   Class 1 obesity 04/09/2023   Conductive hearing loss in left ear 02/09/2023   Colostomy care (HCC) 10/27/2022   Gross hematuria    Symptomatic anemia    Hypertension    Community acquired pneumonia    Acute pulmonary embolism (HCC) 02/26/2021   Acute DVT (deep  venous thrombosis) (HCC) 02/26/2021   Occult  blood in stools 02/26/2021   Pyuria, sterile 02/26/2021   Acute pulmonary embolism without acute cor pulmonale (HCC) 02/26/2021   Alcohol dependency (HCC) 02/20/2019   Chronic otitis externa of left ear 06/30/2018   Headache 10/15/2015   Altered bowel elimination due to intestinal ostomy (HCC) 02/04/2014   Acute kidney injury (HCC) 02/02/2014   Neuralgia, post-herpetic 01/02/2014   HIV disease (HCC) 03/24/2012   Anemia 03/23/2012   Essential hypertension 03/29/2007     Problem List Items Addressed This Visit       Other   HIV disease (HCC) (Chronic)   Mr. Mosley continues to have well-controlled virus with good adherence and tolerance to Triumeq .  Reviewed previous lab work and discussed plan of care and U equals U.  No problems obtaining medication from the pharmacy and covered by [employer] and Medicaid.  Social determinants of health reviewed with no interventions indicated.  Check blood work.  Continue current dose of Triumeq .  Plan for follow-up in 6 months or sooner if needed with lab work on the same day.      Relevant Medications   abacavir -dolutegravir -lamiVUDine  (TRIUMEQ ) 600-50-300 MG tablet   Other Relevant Orders   Comprehensive metabolic panel with GFR   T-helper cells (CD4) count (not at [facility])   HIV-1 RNA quant-no reflex-bld   Healthcare maintenance   Not currently sexually active. Vaccinations reviewed and following counseling update tetanus and Prevnar 20. Routine dental care up-to-date.   Colon cancer screening up-to-date.      Other Visit Diagnoses       Need for Tdap vaccination    -  Primary   Relevant Orders   Tdap vaccine greater than or equal to 7yo IM (Completed)        I have discontinued Blain H. Difabio's pantoprazole . I have also changed his lisinopril  and rosuvastatin . Additionally, I am having him maintain his gabapentin , oxyCODONE , methocarbamol , acetaminophen , ondansetron ,  potassium chloride  SA, CALCIUM  PO, (VITAMIN D , CHOLECALCIFEROL, PO), HYDROcodone -acetaminophen , ondansetron , Triumeq , and amLODipine .   Meds ordered this encounter  Medications   abacavir -dolutegravir -lamiVUDine  (TRIUMEQ ) 600-50-300 MG tablet    Sig: Take 1 tablet by mouth daily.    Dispense:  30 tablet    Refill:  6    Supervising Provider:   SNIDER, CYNTHIA [phone]    Prescription Type::   Renewal   lisinopril  (ZESTRIL ) 20 MG tablet    Sig: Take 1 tablet (20 mg total) by mouth daily.    Dispense:  30 tablet    Refill:  6    Supervising Provider:   SNIDER, CYNTHIA [[id number]]   rosuvastatin  (CRESTOR ) 10 MG tablet    Sig: Take 1 tablet (10 mg total) by mouth daily.    Dispense:  30 tablet    Refill:  6    Supervising Provider:   SNIDER, CYNTHIA [[id number]]   amLODipine  (NORVASC ) 10 MG tablet    Sig: TAKE 1 TABLET(10 MG) BY MOUTH DAILY    Dispense:  30 tablet    Refill:  6    Supervising Provider:   LUIZ CHANNEL [[id number]]     Follow-up: Return in about 6 months (around 04/15/2024). or sooner if needed.    Cathlyn July, MSN, FNP-C Nurse Practitioner [facility] for Infectious Disease [facility] Main number: [phone]

## 2023-10-18 LAB — COMPREHENSIVE METABOLIC PANEL WITH GFR
AG Ratio: 1.4 (calc) (ref 1.0–2.5)
ALT: 14 U/L (ref 9–46)
AST: 19 U/L (ref 10–35)
Albumin: 4.2 g/dL (ref 3.6–5.1)
Alkaline phosphatase (APISO): 72 U/L (ref 35–144)
BUN: 15 mg/dL (ref 7–25)
CO2: 24 mmol/L (ref 20–32)
Calcium: 9 mg/dL (ref 8.6–10.3)
Chloride: 103 mmol/L (ref 98–110)
Creat: 0.93 mg/dL (ref 0.70–1.30)
Globulin: 2.9 g/dL (ref 1.9–3.7)
Glucose, Bld: 135 mg/dL — ABNORMAL HIGH (ref 65–99)
Potassium: 4.9 mmol/L (ref 3.5–5.3)
Sodium: 137 mmol/L (ref 135–146)
Total Bilirubin: 0.5 mg/dL (ref 0.2–1.2)
Total Protein: 7.1 g/dL (ref 6.1–8.1)
eGFR: 97 mL/min/1.73m2 (ref >=60)

## 2023-10-18 LAB — T-HELPER CELLS (CD4) COUNT (NOT AT ARMC)
Absolute CD4: 820 {cells}/uL (ref 490–1740)
CD4 T Helper %: 27 % — ABNORMAL LOW (ref 30–61)
Total lymphocyte count: 3053 {cells}/uL (ref 850–3900)

## 2023-10-18 LAB — HIV-1 RNA QUANT-NO REFLEX-BLD
HIV 1 RNA Quant: <20 {copies}/mL — AB
HIV-1 RNA Quant, Log: <1.3 {Log_copies}/mL — AB

## 2023-10-20 ENCOUNTER — Ambulatory Visit: Payer: Self-pay | Admitting: Family

## 2023-10-20 NOTE — Telephone Encounter (Signed)
 Called patient to relay results, no answer and voicemail full.   Modest Draeger, BSN, RN

## 2023-10-21 NOTE — Telephone Encounter (Signed)
 Spoke with [place], relayed that viral load undetectable and CD4 count healthy at over 800. Relayed that kidney function, liver function, and electrolytes were normal.   Discussed elevated blood glucose of 135, patient will follow up with PCP for further evaluation.   Janette Harvie, BSN, RN

## 2023-11-14 ENCOUNTER — Other Ambulatory Visit (HOSPITAL_COMMUNITY): Payer: Self-pay | Admitting: Surgical Oncology

## 2023-11-14 DIAGNOSIS — K9409 Other complications of colostomy: Secondary | ICD-10-CM

## 2023-11-14 DIAGNOSIS — K9403 Colostomy malfunction: Secondary | ICD-10-CM

## 2023-11-14 DIAGNOSIS — Z939 Artificial opening status, unspecified: Secondary | ICD-10-CM

## 2023-11-18 ENCOUNTER — Ambulatory Visit (HOSPITAL_COMMUNITY): Admission: RE | Admit: 2023-11-18 | Source: Ambulatory Visit

## 2023-11-18 ENCOUNTER — Encounter (HOSPITAL_COMMUNITY): Payer: Self-pay

## 2023-11-24 ENCOUNTER — Ambulatory Visit (HOSPITAL_COMMUNITY)
Admission: RE | Admit: 2023-11-24 | Discharge: 2023-11-24 | Disposition: A | Discharge status: Home / Self Care | Source: Ambulatory Visit | Attending: Surgical Oncology | Admitting: Surgical Oncology

## 2023-11-24 DIAGNOSIS — K9403 Colostomy malfunction: Secondary | ICD-10-CM | POA: Insufficient documentation

## 2023-11-24 DIAGNOSIS — Z939 Artificial opening status, unspecified: Secondary | ICD-10-CM | POA: Insufficient documentation

## 2023-11-24 DIAGNOSIS — K9409 Other complications of colostomy: Secondary | ICD-10-CM | POA: Diagnosis present

## 2023-11-24 MED ORDER — IOHEXOL 300 MG/ML  SOLN
100.0000 mL | Freq: Once | INTRAMUSCULAR | Status: AC | PRN
  Administered 2023-11-24: 100 mL via INTRAVENOUS

## 2024-04-16 ENCOUNTER — Telehealth: Payer: Self-pay

## 2024-04-16 ENCOUNTER — Ambulatory Visit: Admitting: Internal Medicine

## 2024-04-16 NOTE — Telephone Encounter (Signed)
 Called patient to reschedule missed appointment no answer, left HIPAA compliant vm to contact office.
